# Patient Record
Sex: Female | Born: 1960 | Race: White | Hispanic: No | Marital: Married | Smoking: Former smoker
Health system: Southern US, Community
[De-identification: ages and names within clinical notes are randomized; demographics above are authoritative.]

## PROBLEM LIST (undated history)

## (undated) DIAGNOSIS — I2510 Atherosclerotic heart disease of native coronary artery without angina pectoris: Secondary | ICD-10-CM

## (undated) DIAGNOSIS — I313 Pericardial effusion (noninflammatory): Secondary | ICD-10-CM

## (undated) DIAGNOSIS — L732 Hidradenitis suppurativa: Secondary | ICD-10-CM

## (undated) DIAGNOSIS — E213 Hyperparathyroidism, unspecified: Secondary | ICD-10-CM

## (undated) DIAGNOSIS — I639 Cerebral infarction, unspecified: Secondary | ICD-10-CM

## (undated) DIAGNOSIS — I219 Acute myocardial infarction, unspecified: Secondary | ICD-10-CM

## (undated) DIAGNOSIS — I5181 Takotsubo syndrome: Secondary | ICD-10-CM

## (undated) DIAGNOSIS — I10 Essential (primary) hypertension: Secondary | ICD-10-CM

## (undated) DIAGNOSIS — Z974 Presence of external hearing-aid: Secondary | ICD-10-CM

## (undated) DIAGNOSIS — J449 Chronic obstructive pulmonary disease, unspecified: Secondary | ICD-10-CM

## (undated) DIAGNOSIS — Z973 Presence of spectacles and contact lenses: Secondary | ICD-10-CM

## (undated) DIAGNOSIS — T7840XA Allergy, unspecified, initial encounter: Secondary | ICD-10-CM

## (undated) DIAGNOSIS — I3139 Other pericardial effusion (noninflammatory): Secondary | ICD-10-CM

## (undated) DIAGNOSIS — I7300 Raynaud's syndrome without gangrene: Secondary | ICD-10-CM

## (undated) DIAGNOSIS — I209 Angina pectoris, unspecified: Secondary | ICD-10-CM

## (undated) DIAGNOSIS — H269 Unspecified cataract: Secondary | ICD-10-CM

## (undated) DIAGNOSIS — Z972 Presence of dental prosthetic device (complete) (partial): Secondary | ICD-10-CM

## (undated) DIAGNOSIS — E063 Autoimmune thyroiditis: Secondary | ICD-10-CM

## (undated) HISTORY — PX: BREAST EXCISIONAL BIOPSY: SUR124

## (undated) HISTORY — DX: Hidradenitis suppurativa: L73.2

## (undated) HISTORY — PX: CYST EXCISION: SHX5701

## (undated) HISTORY — DX: Allergy, unspecified, initial encounter: T78.40XA

## (undated) HISTORY — DX: Unspecified cataract: H26.9

## (undated) HISTORY — DX: Other pericardial effusion (noninflammatory): I31.39

## (undated) HISTORY — DX: Takotsubo syndrome: I51.81

## (undated) HISTORY — DX: Pericardial effusion (noninflammatory): I31.3

## (undated) HISTORY — DX: Essential (primary) hypertension: I10

## (undated) HISTORY — PX: EYE SURGERY: SHX253

## (undated) HISTORY — PX: LIPOMA EXCISION: SHX5283

## (undated) HISTORY — DX: Atherosclerotic heart disease of native coronary artery without angina pectoris: I25.10

## (undated) HISTORY — PX: PARATHYROIDECTOMY: SHX19

## (undated) HISTORY — DX: Hyperparathyroidism, unspecified: E21.3

## (undated) HISTORY — PX: TONSILLECTOMY: SUR1361

## (undated) HISTORY — DX: Autoimmune thyroiditis: E06.3

## (undated) HISTORY — PX: CARDIAC CATHETERIZATION: SHX172

---
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
## 2015-09-17 DIAGNOSIS — I219 Acute myocardial infarction, unspecified: Secondary | ICD-10-CM

## 2015-09-17 HISTORY — DX: Acute myocardial infarction, unspecified: I21.9

## 2019-04-13 LAB — HM MAMMOGRAPHY

## 2019-09-30 ENCOUNTER — Ambulatory Visit (INDEPENDENT_AMBULATORY_CARE_PROVIDER_SITE_OTHER): Payer: No Typology Code available for payment source | Admitting: Nurse Practitioner

## 2019-09-30 ENCOUNTER — Encounter: Payer: Self-pay | Admitting: Nurse Practitioner

## 2019-09-30 ENCOUNTER — Other Ambulatory Visit: Payer: Self-pay

## 2019-09-30 VITALS — BP 125/71 | HR 65

## 2019-09-30 DIAGNOSIS — Z9889 Other specified postprocedural states: Secondary | ICD-10-CM | POA: Insufficient documentation

## 2019-09-30 DIAGNOSIS — N952 Postmenopausal atrophic vaginitis: Secondary | ICD-10-CM

## 2019-09-30 DIAGNOSIS — I2510 Atherosclerotic heart disease of native coronary artery without angina pectoris: Secondary | ICD-10-CM | POA: Diagnosis not present

## 2019-09-30 DIAGNOSIS — E782 Mixed hyperlipidemia: Secondary | ICD-10-CM | POA: Diagnosis not present

## 2019-09-30 DIAGNOSIS — E892 Postprocedural hypoparathyroidism: Secondary | ICD-10-CM

## 2019-09-30 DIAGNOSIS — E063 Autoimmune thyroiditis: Secondary | ICD-10-CM | POA: Diagnosis not present

## 2019-09-30 DIAGNOSIS — Z9009 Acquired absence of other part of head and neck: Secondary | ICD-10-CM

## 2019-09-30 DIAGNOSIS — I10 Essential (primary) hypertension: Secondary | ICD-10-CM

## 2019-09-30 DIAGNOSIS — I25118 Atherosclerotic heart disease of native coronary artery with other forms of angina pectoris: Secondary | ICD-10-CM | POA: Insufficient documentation

## 2019-09-30 DIAGNOSIS — L732 Hidradenitis suppurativa: Secondary | ICD-10-CM

## 2019-09-30 DIAGNOSIS — I252 Old myocardial infarction: Secondary | ICD-10-CM

## 2019-09-30 DIAGNOSIS — Z87891 Personal history of nicotine dependence: Secondary | ICD-10-CM | POA: Insufficient documentation

## 2019-09-30 DIAGNOSIS — E785 Hyperlipidemia, unspecified: Secondary | ICD-10-CM | POA: Insufficient documentation

## 2019-09-30 NOTE — Assessment & Plan Note (Signed)
Chronic, ongoing.  Continue current medication regimen and adjust as needed.  Plan on thyroid labs in 4 weeks at physical.

## 2019-09-30 NOTE — Assessment & Plan Note (Signed)
Recommend continued cessation for overall health.

## 2019-09-30 NOTE — Assessment & Plan Note (Signed)
Continue Ticagrelor and Atorvastatin for prevention.  Will place referral to cardiology for patient to establish care here.

## 2019-09-30 NOTE — Assessment & Plan Note (Signed)
Chronic, stable at this time.  Treat flares as needed and consider referral to dermatology if needed.

## 2019-09-30 NOTE — Assessment & Plan Note (Signed)
At length discussion with patient on use of hormones and cardiac health. Currently she is using intravaginal and not oral.  She is okay with reduction in future of medication and use of OTC lubrications.

## 2019-09-30 NOTE — Assessment & Plan Note (Signed)
Chronic, ongoing with history of MI.  Continue statin and adjust dose as needed.  Plan on labs in 4 weeks at physical visit.

## 2019-09-30 NOTE — Patient Instructions (Signed)
DASH Eating Plan DASH stands for "Dietary Approaches to Stop Hypertension." The DASH eating plan is a healthy eating plan that has been shown to reduce high blood pressure (hypertension). It may also reduce your risk for type 2 diabetes, heart disease, and stroke. The DASH eating plan may also help with weight loss. What are tips for following this plan?  General guidelines  Avoid eating more than 2,300 mg (milligrams) of salt (sodium) a day. If you have hypertension, you may need to reduce your sodium intake to 1,500 mg a day.  Limit alcohol intake to no more than 1 drink a day for nonpregnant women and 2 drinks a day for men. One drink equals 12 oz of beer, 5 oz of wine, or 1 oz of hard liquor.  Work with your health care provider to maintain a healthy body weight or to lose weight. Ask what an ideal weight is for you.  Get at least 30 minutes of exercise that causes your heart to beat faster (aerobic exercise) most days of the week. Activities may include walking, swimming, or biking.  Work with your health care provider or diet and nutrition specialist (dietitian) to adjust your eating plan to your individual calorie needs. Reading food labels   Check food labels for the amount of sodium per serving. Choose foods with less than 5 percent of the Daily Value of sodium. Generally, foods with less than 300 mg of sodium per serving fit into this eating plan.  To find whole grains, look for the word "whole" as the first word in the ingredient list. Shopping  Buy products labeled as "low-sodium" or "no salt added."  Buy fresh foods. Avoid canned foods and premade or frozen meals. Cooking  Avoid adding salt when cooking. Use salt-free seasonings or herbs instead of table salt or sea salt. Check with your health care provider or pharmacist before using salt substitutes.  Do not fry foods. Cook foods using healthy methods such as baking, boiling, grilling, and broiling instead.  Cook with  heart-healthy oils, such as olive, canola, soybean, or sunflower oil. Meal planning  Eat a balanced diet that includes: ? 5 or more servings of fruits and vegetables each day. At each meal, try to fill half of your plate with fruits and vegetables. ? Up to 6-8 servings of whole grains each day. ? Less than 6 oz of lean meat, poultry, or fish each day. A 3-oz serving of meat is about the same size as a deck of cards. One egg equals 1 oz. ? 2 servings of low-fat dairy each day. ? A serving of nuts, seeds, or beans 5 times each week. ? Heart-healthy fats. Healthy fats called Omega-3 fatty acids are found in foods such as flaxseeds and coldwater fish, like sardines, salmon, and mackerel.  Limit how much you eat of the following: ? Canned or prepackaged foods. ? Food that is high in trans fat, such as fried foods. ? Food that is high in saturated fat, such as fatty meat. ? Sweets, desserts, sugary drinks, and other foods with added sugar. ? Full-fat dairy products.  Do not salt foods before eating.  Try to eat at least 2 vegetarian meals each week.  Eat more home-cooked food and less restaurant, buffet, and fast food.  When eating at a restaurant, ask that your food be prepared with less salt or no salt, if possible. What foods are recommended? The items listed may not be a complete list. Talk with your dietitian about   what dietary choices are best for you. Grains Whole-grain or whole-wheat bread. Whole-grain or whole-wheat pasta. Brown rice. Oatmeal. Quinoa. Bulgur. Whole-grain and low-sodium cereals. Pita bread. Low-fat, low-sodium crackers. Whole-wheat flour tortillas. Vegetables Fresh or frozen vegetables (raw, steamed, roasted, or grilled). Low-sodium or reduced-sodium tomato and vegetable juice. Low-sodium or reduced-sodium tomato sauce and tomato paste. Low-sodium or reduced-sodium canned vegetables. Fruits All fresh, dried, or frozen fruit. Canned fruit in natural juice (without  added sugar). Meat and other protein foods Skinless chicken or turkey. Ground chicken or turkey. Pork with fat trimmed off. Fish and seafood. Egg whites. Dried beans, peas, or lentils. Unsalted nuts, nut butters, and seeds. Unsalted canned beans. Lean cuts of beef with fat trimmed off. Low-sodium, lean deli meat. Dairy Low-fat (1%) or fat-free (skim) milk. Fat-free, low-fat, or reduced-fat cheeses. Nonfat, low-sodium ricotta or cottage cheese. Low-fat or nonfat yogurt. Low-fat, low-sodium cheese. Fats and oils Soft margarine without trans fats. Vegetable oil. Low-fat, reduced-fat, or light mayonnaise and salad dressings (reduced-sodium). Canola, safflower, olive, soybean, and sunflower oils. Avocado. Seasoning and other foods Herbs. Spices. Seasoning mixes without salt. Unsalted popcorn and pretzels. Fat-free sweets. What foods are not recommended? The items listed may not be a complete list. Talk with your dietitian about what dietary choices are best for you. Grains Baked goods made with fat, such as croissants, muffins, or some breads. Dry pasta or rice meal packs. Vegetables Creamed or fried vegetables. Vegetables in a cheese sauce. Regular canned vegetables (not low-sodium or reduced-sodium). Regular canned tomato sauce and paste (not low-sodium or reduced-sodium). Regular tomato and vegetable juice (not low-sodium or reduced-sodium). Pickles. Olives. Fruits Canned fruit in a light or heavy syrup. Fried fruit. Fruit in cream or butter sauce. Meat and other protein foods Fatty cuts of meat. Ribs. Fried meat. Bacon. Sausage. Bologna and other processed lunch meats. Salami. Fatback. Hotdogs. Bratwurst. Salted nuts and seeds. Canned beans with added salt. Canned or smoked fish. Whole eggs or egg yolks. Chicken or turkey with skin. Dairy Whole or 2% milk, cream, and half-and-half. Whole or full-fat cream cheese. Whole-fat or sweetened yogurt. Full-fat cheese. Nondairy creamers. Whipped toppings.  Processed cheese and cheese spreads. Fats and oils Butter. Stick margarine. Lard. Shortening. Ghee. Bacon fat. Tropical oils, such as coconut, palm kernel, or palm oil. Seasoning and other foods Salted popcorn and pretzels. Onion salt, garlic salt, seasoned salt, table salt, and sea salt. Worcestershire sauce. Tartar sauce. Barbecue sauce. Teriyaki sauce. Soy sauce, including reduced-sodium. Steak sauce. Canned and packaged gravies. Fish sauce. Oyster sauce. Cocktail sauce. Horseradish that you find on the shelf. Ketchup. Mustard. Meat flavorings and tenderizers. Bouillon cubes. Hot sauce and Tabasco sauce. Premade or packaged marinades. Premade or packaged taco seasonings. Relishes. Regular salad dressings. Where to find more information:  National Heart, Lung, and Blood Institute: www.nhlbi.nih.gov  American Heart Association: www.heart.org Summary  The DASH eating plan is a healthy eating plan that has been shown to reduce high blood pressure (hypertension). It may also reduce your risk for type 2 diabetes, heart disease, and stroke.  With the DASH eating plan, you should limit salt (sodium) intake to 2,300 mg a day. If you have hypertension, you may need to reduce your sodium intake to 1,500 mg a day.  When on the DASH eating plan, aim to eat more fresh fruits and vegetables, whole grains, lean proteins, low-fat dairy, and heart-healthy fats.  Work with your health care provider or diet and nutrition specialist (dietitian) to adjust your eating plan to your   individual calorie needs. This information is not intended to replace advice given to you by your health care provider. Make sure you discuss any questions you have with your health care provider. Document Revised: 08/15/2017 Document Reviewed: 08/26/2016 Elsevier Patient Education  2020 Elsevier Inc.  

## 2019-09-30 NOTE — Progress Notes (Addendum)
New Patient Office Visit  Subjective:  Patient ID: April Raymond, female    DOB: [birth year]  Age: 59 y.o. MRN: [id number]  CC:  Chief Complaint  Patient presents with  . Establish Care    . This visit was completed via Doximity due to the restrictions of the COVID-19 pandemic. All issues as above were discussed and addressed. Physical exam was done as above through visual confirmation on Doximity. If it was felt that the patient should be evaluated in the office, they were directed there. The patient verbally consented to this visit. . Location of the patient: home . Location of the provider: home . Those involved with this call:  . Provider: Marnee Guarneri, DNP . CMA: Yvonna Alanis, CMA . Front Desk/Registration: Don Perking  . Time spent on call: 30 minutes with patient face to face via video conference. More than 50% of this time was spent in counseling and coordination of care. 15 minutes total spent in review of patient's record and preparation of their chart.  . I verified patient identity using two factors (patient name and date of birth). Patient consents verbally to being seen via telemedicine visit today.    HPI April Raymond presents for new patient visit to establish care.  Introduced to Designer, jewellery role and practice setting.  All questions answered.  No current medical records available.  Her husband and her recently moved to [place] from [place], currently they are living in their mobile home due to the home they purchased having recent Covid + sellers who are still in house.  HYPERTENSION WITH CAD Had MI 4 years ago, age 35.  Has a family history of MI, mother in her 73's and her father's dad passed in his 12's.  Had parathyroid removed in 2018, history of hyperparathyroid. Was followed by cardiology in [place] twice a year due to her significant family history.  She continues on Ticagrelor, Amlodipine, and Metoprolol. Hypertension status: stable    Satisfied with current treatment? yes Duration of hypertension: chronic BP monitoring frequency:  daily BP range: 120/70's range at home BP medication side effects:  no Medication compliance: good compliance Aspirin: ticagrelor, no ASA Recurrent headaches: no Visual changes: no Palpitations: no Dyspnea: no Chest pain: no Lower extremity edema: no Dizzy/lightheaded: no   HYPERLIPIDEMIA Continues on Atorvastatin 20 MG daily.  Significant cardiac history in family. Hyperlipidemia status: good compliance Satisfied with current treatment?  yes Side effects:  no Medication compliance: good compliance Past cholesterol meds: Atorvastatin Supplements: none Aspirin:  no The ASCVD Risk score Mikey Bussing DC Jr., et al., 2013) failed to calculate for the following reasons:   Cannot find a previous HDL lab   Cannot find a previous total cholesterol lab   Unable to determine if patient is Non-Hispanic African American Chest pain:  no Coronary artery disease:  yes Family history CAD:  yes Family history early CAD:  yes  HIDRADENITIS SUPPURATIVA Diagnosed 4 years, cysts to groin area.  Her flares present to groin area majority of the time.  Has seen dermatology in past, but they have told her there is not much they can do.  Has been treated with abx for flares or s/s infection in past.  No current flares.  HASHIMOTO'S DISEASE Diagnosed 4 years ago.  Continues on Levothyroxine 75 MCG daily.   Thyroid control status:stable Satisfied with current treatment? yes Medication side effects: no Medication compliance: good compliance Etiology of hypothyroidism:  Recent dose adjustment:no Fatigue: no Cold intolerance: yes  Heat intolerance: no Weight gain: no Weight loss: no Constipation: no Diarrhea/loose stools: no Palpitations: no Lower extremity edema: no Anxiety/depressed mood: no   MENOPAUSAL SYMPTOMS Currently uses Estradiol 10 MCG which she inserts in vagina twice a day.  Discussed at  length with her that this is meant for short term use and risk factors with her cardiac history. Gravida/Para: 2/2 Duration: stable Symptom severity: mild Hot flashes: no Night sweats: no Sleep disturbances: no Vaginal dryness: yes Dyspareunia:no Decreased libido: no Emotional lability: no Stress incontinence: no Previous HRT/pharmacotherapy: no Hysterectomy: no GYN surgery: none Absolute Contraindications to Hormonal Therapy:     Undiagnosed vaginal bleeding: no    Breast cancer: family history    Endometrial cancer: no    Coronary disease: yes    Cerebrovascular disease: no    Venous thromboembolic disease: no  Past Medical History:  Diagnosis Date  . CAD (coronary artery disease)   . Hashimoto's disease   . Hidradenitis suppurativa   . Hydradenitis   . Hypertension   . Myocardial infarction [facility])     Past Surgical History:  Procedure Laterality Date  . CYST EXCISION  x2  . PARATHYROIDECTOMY    . TONSILLECTOMY      Family History  Problem Relation Age of Onset  . Heart attack Mother   . COPD Mother   . Stroke Mother   . Diverticulitis Mother   . Cancer Mother        breast  . Hypertension Mother   . Hypertension Father   . Hypertension Brother   . Alzheimer's disease Paternal Grandmother     Social History   Socioeconomic History  . Marital status: Married    Spouse name: Not on file  . Number of children: Not on file  . Years of education: Not on file  . Highest education level: Not on file  Occupational History  . Not on file  Tobacco Use  . Smoking status: Former Smoker    Quit date: 09/18/2015    Years since quitting: 4.0  . Smokeless tobacco: Never Used  Substance and Sexual Activity  . Alcohol use: Yes    Comment: on occasion  . Drug use: Never  . Sexual activity: Not on file  Other Topics Concern  . Not on file  Social History Narrative  . Not on file   Social Determinants of Health   Financial Resource Strain: Low Risk   .  Difficulty of Paying Living Expenses: Not hard at all  Food Insecurity: No Food Insecurity  . Worried About Charity fundraiser in the Last Year: Never true  . Ran Out of Food in the Last Year: Never true  Transportation Needs: No Transportation Needs  . Lack of Transportation (Medical): No  . Lack of Transportation (Non-Medical): No  Physical Activity: Insufficiently Active  . Days of Exercise per Week: 3 days  . Minutes of Exercise per Session: 20 min  Stress: No Stress Concern Present  . Feeling of Stress : Only a little  Social Connections: Slightly Isolated  . Frequency of Communication with Friends and Family: Three times a week  . Frequency of Social Gatherings with Friends and Family: Three times a week  . Attends Religious Services: 1 to 4 times per year  . Active Member of Clubs or Organizations: No  . Attends Archivist Meetings: Never  . Marital Status: Married  Human resources officer Violence:   . Fear of Current or Ex-Partner: Not on file  .  Emotionally Abused: Not on file  . Physically Abused: Not on file  . Sexually Abused: Not on file    ROS Review of Systems  Constitutional: Negative for activity change, appetite change, diaphoresis, fatigue and fever.  Respiratory: Negative for cough, chest tightness, shortness of breath and wheezing.   Cardiovascular: Negative for chest pain, palpitations and leg swelling.  Gastrointestinal: Negative.   Endocrine: Negative for cold intolerance and heat intolerance.  Neurological: Negative.   Psychiatric/Behavioral: Negative.     Objective:   Today's Vitals: BP 125/71   Pulse 65   LMP 11/16/2007 (Approximate)   Physical Exam Vitals and nursing note reviewed.  Constitutional:      General: She is awake. She is not in acute distress.    Appearance: She is well-developed. She is not ill-appearing.  HENT:     Head: Normocephalic.     Right Ear: Hearing normal.     Left Ear: Hearing normal.  Eyes:     General:  Lids are normal.        Right eye: No discharge.        Left eye: No discharge.     Conjunctiva/sclera: Conjunctivae normal.  Pulmonary:     Effort: Pulmonary effort is normal. No accessory muscle usage or respiratory distress.  Musculoskeletal:     Cervical back: Normal range of motion.  Neurological:     Mental Status: She is alert and oriented to person, place, and time.  Psychiatric:        Attention and Perception: Attention normal.        Mood and Affect: Mood normal.        Behavior: Behavior normal. Behavior is cooperative.        Thought Content: Thought content normal.        Judgment: Judgment normal.    Assessment & Plan:   Problem List Items Addressed This Visit      Cardiovascular and Mediastinum   CAD (coronary artery disease)    Continue Ticagrelor and Atorvastatin for prevention.  Will place referral to cardiology for patient to establish care here.      Relevant Medications   metoprolol tartrate (LOPRESSOR) 25 MG tablet   amLODipine (NORVASC) 2.5 MG tablet   atorvastatin (LIPITOR) 20 MG tablet   Other Relevant Orders   Ambulatory referral to Cardiology   Essential hypertension - Primary    Chronic, stable with BP at goal on home readings. Will continue current medication regimen and adjust as needed.  Plan to place referral to cardiology so she may establish care here with them due to her significant family and personal cardiac history.  Return in 4 weeks for physical exam.      Relevant Medications   metoprolol tartrate (LOPRESSOR) 25 MG tablet   amLODipine (NORVASC) 2.5 MG tablet   atorvastatin (LIPITOR) 20 MG tablet   Other Relevant Orders   Ambulatory referral to Cardiology     Endocrine   Hashimoto's disease    Chronic, ongoing.  Continue current medication regimen and adjust as needed.  Plan on thyroid labs in 4 weeks at physical.      Relevant Medications   levothyroxine (SYNTHROID) 75 MCG tablet   metoprolol tartrate (LOPRESSOR) 25 MG  tablet     Musculoskeletal and Integument   Hidradenitis suppurativa    Chronic, stable at this time.  Treat flares as needed and consider referral to dermatology if needed.        Genitourinary   Vaginal atrophy  At length discussion with patient on use of hormones and cardiac health. Currently she is using intravaginal and not oral.  She is okay with reduction in future of medication and use of OTC lubrications.          Other   Former smoker    Recommend continued cessation for overall health.      History of MI (myocardial infarction)    Continue current medication regimen and will place referral to cardiology here to establish care.      Relevant Orders   Ambulatory referral to Cardiology   History of parathyroidectomy    History of removal in 2018 and has been stable since, continue to monitor.      Relevant Orders   Ambulatory referral to Cardiology   Hyperlipidemia    Chronic, ongoing with history of MI.  Continue statin and adjust dose as needed.  Plan on labs in 4 weeks at physical visit.      Relevant Medications   metoprolol tartrate (LOPRESSOR) 25 MG tablet   amLODipine (NORVASC) 2.5 MG tablet   atorvastatin (LIPITOR) 20 MG tablet   Other Relevant Orders   Ambulatory referral to Cardiology      Outpatient Encounter Medications as of 09/30/2019  Medication Sig  . amLODipine (NORVASC) 2.5 MG tablet Take 2.5 mg by mouth daily.  Marland Kitchen atorvastatin (LIPITOR) 20 MG tablet Take 20 mg by mouth daily.  . Estradiol 10 MCG INST Place 1 tablet vaginally 2 (two) times a week.  . levothyroxine (SYNTHROID) 75 MCG tablet Take 75 mcg by mouth daily before breakfast.  . metoprolol tartrate (LOPRESSOR) 25 MG tablet Take 25 mg by mouth daily.  . pantoprazole (PROTONIX) 40 MG tablet Take 40 mg by mouth daily.  . ticagrelor (BRILINTA) 60 MG TABS tablet Take 60 mg by mouth 2 (two) times daily.   No facility-administered encounter medications on file as of 09/30/2019.   I  discussed the assessment and treatment plan with the patient. The patient was provided an opportunity to ask questions and all were answered. The patient agreed with the plan and demonstrated an understanding of the instructions.   The patient was advised to call back or seek an in-person evaluation if the symptoms worsen or if the condition fails to improve as anticipated.   I provided 30 minutes of time during this encounter.  Follow-up: Return in about 4 weeks (around 10/28/2019) for Annual physical if covered by insurance --- if not then follow-up.   Venita Lick, NP

## 2019-09-30 NOTE — Assessment & Plan Note (Signed)
Continue current medication regimen and will place referral to cardiology here to establish care.

## 2019-09-30 NOTE — Assessment & Plan Note (Signed)
Chronic, stable with BP at goal on home readings. Will continue current medication regimen and adjust as needed.  Plan to place referral to cardiology so she may establish care here with them due to her significant family and personal cardiac history.  Return in 4 weeks for physical exam.

## 2019-09-30 NOTE — Assessment & Plan Note (Signed)
History of removal in 2018 and has been stable since, continue to monitor. 

## 2019-10-29 ENCOUNTER — Encounter: Payer: Self-pay | Admitting: Internal Medicine

## 2019-10-29 ENCOUNTER — Other Ambulatory Visit: Payer: Self-pay

## 2019-10-29 ENCOUNTER — Ambulatory Visit (INDEPENDENT_AMBULATORY_CARE_PROVIDER_SITE_OTHER): Payer: No Typology Code available for payment source | Admitting: Internal Medicine

## 2019-10-29 VITALS — BP 150/80 | HR 68 | Ht 67.0 in | Wt 145.0 lb

## 2019-10-29 DIAGNOSIS — I10 Essential (primary) hypertension: Secondary | ICD-10-CM

## 2019-10-29 DIAGNOSIS — E785 Hyperlipidemia, unspecified: Secondary | ICD-10-CM | POA: Diagnosis not present

## 2019-10-29 DIAGNOSIS — I2510 Atherosclerotic heart disease of native coronary artery without angina pectoris: Secondary | ICD-10-CM

## 2019-10-29 NOTE — Progress Notes (Signed)
New Outpatient Visit Date: 10/29/2019  Referring Provider: Venita Lick, NP [address] [place],   [address]  Chief Complaint: Theology care  HPI:  Ms. Denis is a 59 y.o. female who is being seen today to establish cardiology care at the request of Ms. Cannady. 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  Ms. Fray recently relocated from [place].  She reports developing acute diaphoresis and chest pressure radiating to both arms after having cleaned her house in 09/2015.  She was found to have inferior ST elevation by EKG and underwent emergent cardiac catheterization with stenting of a long segment of the proximal and mid RCA.  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  Today, Ms. Matlick is feeling well.  She denies chest pain, shortness of breath, palpitations, lightheadedness, and edema.  She exercises regularly without limitations.  She is tolerating her current medications well.  She states that her blood pressure is typically well controlled at home and reports a history of whitecoat hypertension.  --------------------------------------------------------------------------------------------------  Cardiovascular History & Procedures: Cardiovascular Problems:  Coronary artery disease status post MI  Risk Factors:  Known coronary artery disease, hypertension, and hyperlipidemia  Cath/PCI:  LHC/PCI (10/15/2015, [place]): LMCA normal.  LAD with discrete 60% mid vessel stenosis.  LCx normal.  RCA with 100% occlusion.  LVEF 55% with inferior hypokinesis.  Successful PCI to RCA with overlapping Synergy 2.5 x 12, 2.25 x 12, and 2.25 x 32 mm drug-eluting  stents.  Patient also required temporary transvenous pacer and intra-aortic balloon pump.  CV Surgery:  None  EP Procedures and Devices:  None  Non-Invasive Evaluation(s):  Exercise MPI (09/19/2017): Mildly submaximal stress test with no ischemia or scar.  LVEF 75%.  --------------------------------------------------------------------------------------------------  Past Medical History:  Diagnosis Date  . CAD (coronary artery disease)   . Hashimoto's disease   . Hidradenitis suppurativa   . Hydradenitis   . Hyperparathyroidism ([place])   . Hypertension   . Myocardial infarction [facility])     Past Surgical History:  Procedure Laterality Date  . CYST EXCISION  x2  . PARATHYROIDECTOMY    . TONSILLECTOMY      Current Meds  Medication Sig  . amLODipine (NORVASC) 2.5 MG tablet Take 2.5 mg by mouth daily.  Marland Kitchen atorvastatin (LIPITOR) 20 MG tablet Take 20 mg by mouth daily.  . Estradiol 10 MCG INST Place 1 tablet vaginally 2 (two) times a week.  . levothyroxine (SYNTHROID) 75 MCG tablet Take 75 mcg by mouth daily before breakfast.  . metoprolol tartrate (LOPRESSOR) 25 MG tablet Take 25 mg by mouth daily.  . pantoprazole (PROTONIX) 40 MG tablet Take 40 mg by mouth daily.  . ticagrelor (BRILINTA) 60 MG TABS tablet Take 60 mg by mouth 2 (two) times daily.    Allergies: Humira [adalimumab]  Social History   Tobacco Use  . Smoking status: Former Smoker    Packs/day: 1.00    Years: 40.00    Pack years: 40.00    Types: Cigarettes    Quit date: 09/18/2015    Years since quitting: 4.1  . Smokeless tobacco: Never Used  Substance Use Topics  . Alcohol use: Yes    Comment: on occasion  . Drug use: Never    Family  History  Problem Relation Age of Onset  . Heart attack Mother 37  . COPD Mother   . Stroke Mother   . Diverticulitis Mother   . Cancer Mother        breast  . Hypertension Mother   . Hypertension Father   . Hypertension Brother   . Stroke Maternal Grandfather   .  Alzheimer's disease Paternal Grandmother   . Heart attack Paternal Grandfather     Review of Systems: A 12-system review of systems was performed and was negative except as noted in the HPI.  --------------------------------------------------------------------------------------------------  Physical Exam: BP (!) 150/80 (BP Location: Right Arm, Patient Position: Sitting, Cuff Size: Normal)   Pulse 68   Ht 5\' 7"  (1.702 m)   Wt 145 lb (65.8 kg)   SpO2 98%   BMI 22.71 kg/m   General: NAD. HEENT: No conjunctival pallor or scleral icterus. Facemask in place. Neck: Supple without lymphadenopathy, thyromegaly, JVD, or HJR. No carotid bruit. Lungs: Normal work of breathing. Clear to auscultation bilaterally without wheezes or crackles. Heart: Regular rate and rhythm without murmurs, rubs, or gallops. Non-displaced PMI. Abd: Bowel sounds present. Soft, NT/ND without hepatosplenomegaly Ext: No lower extremity edema. Radial, PT, and DP pulses are 2+ bilaterally Skin: Warm and dry without rash. Neuro: CNIII-XII intact. Strength and fine-touch sensation intact in upper and lower extremities bilaterally. Psych: Normal mood and affect.  EKG: Normal sinus rhythm without significant abnormality.  Outside labs (07/06/2019): Lipid panel: Total cholesterol 134, HDL 54, LDL 61, triglycerides 93  CBC: White blood cell 6.6, hemoglobin 12.5, hematocrit 39.8, platelets 337  TSH: 1.86  CMP: Sodium 141, potassium 4.6, chloride 104, CO2 28, BUN 25, creatinine 0.9, glucose 82, calcium 9.8, AST 18, ALT 19, alkaline phosphatase 30, total bilirubin 0.6, total protein 6.8, albumin 4.4  Hemoglobin A1c 5.9%  --------------------------------------------------------------------------------------------------  ASSESSMENT AND PLAN: Coronary artery disease: Ms. Ransier is status post inferior STEMI complicated by RV failure and 2017.  She recovered well from this and has been asymptomatic.  Most recent noninvasive  study in 2019 showed no ischemia or scar.  She is on appropriate medications for secondary prevention, including indefinite ticagrelor 60 mg twice daily.  I think is reasonable to continue with this regimen.  Hyperlipidemia: LDL at goal on most recent check in 06/2019, though escalation to high intensity statin therapy may provide some additional benefit.  We will plan to repeat a lipid panel in around 6 months and readdress escalation of atorvastatin at that time.  Hypertension: Blood pressure mildly elevated today, the readings are typically better at home.  I have advised Ms. Racanelli to continue monitoring her blood pressure and let us know if it is consistently above 140/90.  I reinforced importance of sodium restriction and continued exercise.  Follow-up: Return to clinic in 6 months.  Nelva Bush, MD 10/29/2019 10:30 PM

## 2019-10-29 NOTE — Patient Instructions (Signed)
Monitor your Blood pressure and heart rate at home. Let us know if BP is consistently greater than 140/90.  Medication Instructions:  Your physician recommends that you continue on your current medications as directed. Please refer to the Current Medication list given to you today.  *If you need a refill on your cardiac medications before your next appointment, please call your pharmacy*  Lab Work: NONE If you have labs (blood work) drawn today and your tests are completely normal, you will receive your results only by: Marland Kitchen MyChart Message (if you have MyChart) OR . A paper copy in the mail If you have any lab test that is abnormal or we need to change your treatment, we will call you to review the results.  Testing/Procedures: NONE  Follow-Up: At [facility], you and your health needs are our priority.  As part of our continuing mission to provide you with exceptional heart care, we have created designated Provider Care Teams.  These Care Teams include your primary Cardiologist (physician) and Advanced Practice Providers (APPs -  Physician Assistants and Nurse Practitioners) who all work together to provide you with the care you need, when you need it.  Your next appointment:   6 month(s)  The format for your next appointment:   In Person  Provider:    You may see DR Harrell Gave END or one of the following Advanced Practice Providers on your designated Care Team:    Murray Hodgkins, NP  Christell Faith, PA-C  Marrianne Mood, PA-C

## 2019-11-05 ENCOUNTER — Other Ambulatory Visit: Payer: Self-pay

## 2019-11-05 ENCOUNTER — Encounter: Payer: Self-pay | Admitting: Nurse Practitioner

## 2019-11-05 ENCOUNTER — Telehealth: Payer: Self-pay | Admitting: Internal Medicine

## 2019-11-05 ENCOUNTER — Ambulatory Visit (INDEPENDENT_AMBULATORY_CARE_PROVIDER_SITE_OTHER): Payer: No Typology Code available for payment source | Admitting: Nurse Practitioner

## 2019-11-05 VITALS — BP 142/80 | HR 67 | Temp 97.9°F | Ht 66.3 in | Wt 146.4 lb

## 2019-11-05 DIAGNOSIS — E782 Mixed hyperlipidemia: Secondary | ICD-10-CM | POA: Diagnosis not present

## 2019-11-05 DIAGNOSIS — Z1231 Encounter for screening mammogram for malignant neoplasm of breast: Secondary | ICD-10-CM

## 2019-11-05 DIAGNOSIS — E063 Autoimmune thyroiditis: Secondary | ICD-10-CM | POA: Diagnosis not present

## 2019-11-05 DIAGNOSIS — I2510 Atherosclerotic heart disease of native coronary artery without angina pectoris: Secondary | ICD-10-CM | POA: Diagnosis not present

## 2019-11-05 DIAGNOSIS — E559 Vitamin D deficiency, unspecified: Secondary | ICD-10-CM

## 2019-11-05 DIAGNOSIS — Z87891 Personal history of nicotine dependence: Secondary | ICD-10-CM

## 2019-11-05 DIAGNOSIS — Z0000 Encounter for general adult medical examination without abnormal findings: Secondary | ICD-10-CM

## 2019-11-05 DIAGNOSIS — I10 Essential (primary) hypertension: Secondary | ICD-10-CM | POA: Diagnosis not present

## 2019-11-05 DIAGNOSIS — R222 Localized swelling, mass and lump, trunk: Secondary | ICD-10-CM

## 2019-11-05 DIAGNOSIS — L732 Hidradenitis suppurativa: Secondary | ICD-10-CM

## 2019-11-05 DIAGNOSIS — N952 Postmenopausal atrophic vaginitis: Secondary | ICD-10-CM

## 2019-11-05 DIAGNOSIS — I252 Old myocardial infarction: Secondary | ICD-10-CM

## 2019-11-05 MED ORDER — LEVOTHYROXINE SODIUM 75 MCG PO TABS: 75.0000 ug | ORAL_TABLET | Freq: Every day | ORAL | 3 refills | Status: DC

## 2019-11-05 MED ORDER — PANTOPRAZOLE SODIUM 40 MG PO TBEC: 40.0000 mg | DELAYED_RELEASE_TABLET | Freq: Every day | ORAL | 3 refills | Status: DC

## 2019-11-05 MED ORDER — TICAGRELOR 60 MG PO TABS: 60.0000 mg | ORAL_TABLET | Freq: Two times a day (BID) | ORAL | 3 refills | Status: DC

## 2019-11-05 MED ORDER — ATORVASTATIN CALCIUM 20 MG PO TABS: 20.0000 mg | ORAL_TABLET | Freq: Every day | ORAL | 3 refills | Status: DC

## 2019-11-05 MED ORDER — METOPROLOL TARTRATE 25 MG PO TABS: 25.0000 mg | ORAL_TABLET | Freq: Every day | ORAL | 3 refills | Status: DC

## 2019-11-05 MED ORDER — ESTRADIOL 10 MCG VA INST: 1.0000 | VAGINAL_INSERT | VAGINAL | 3 refills | Status: DC

## 2019-11-05 MED ORDER — AMLODIPINE BESYLATE 2.5 MG PO TABS: 2.5000 mg | ORAL_TABLET | Freq: Every day | ORAL | 3 refills | Status: DC

## 2019-11-05 MED ORDER — NITROGLYCERIN 0.4 MG SL SUBL: 0.4000 mg | SUBLINGUAL_TABLET | SUBLINGUAL | 3 refills | Status: DC | PRN

## 2019-11-05 NOTE — Assessment & Plan Note (Signed)
Chronic, ongoing with BP slightly elevated on visit today, but at goal on home readings. Will continue current medication regimen and adjust as needed.  Continue collaboration with cardiology due to her significant family and personal cardiac history.  Labs today. Return in 6 months. 

## 2019-11-05 NOTE — Assessment & Plan Note (Signed)
At length discussion with patient on use of hormones and cardiac health. Currently she is using intravaginal and not oral.  She is okay with reduction in future of medication and use of OTC lubrications.

## 2019-11-05 NOTE — Patient Instructions (Addendum)
[facility]  Address: [address] [place], [place], [place] [address]  Phone: [phone]  Health Maintenance, Female Adopting a healthy lifestyle and getting preventive care are important in promoting health and wellness. Ask your health care provider about:  The right schedule for you to have regular tests and exams.  Things you can do on your own to prevent diseases and keep yourself healthy. What should I know about diet, weight, and exercise? Eat a healthy diet   Eat a diet that includes plenty of vegetables, fruits, low-fat dairy products, and lean protein.  Do not eat a lot of foods that are high in solid fats, added sugars, or sodium. Maintain a healthy weight Body mass index (BMI) is used to identify weight problems. It estimates body fat based on height and weight. Your health care provider can help determine your BMI and help you achieve or maintain a healthy weight. Get regular exercise Get regular exercise. This is one of the most important things you can do for your health. Most adults should:  Exercise for at least 150 minutes each week. The exercise should increase your heart rate and make you sweat (moderate-intensity exercise).  Do strengthening exercises at least twice a week. This is in addition to the moderate-intensity exercise.  Spend less time sitting. Even light physical activity can be beneficial. Watch cholesterol and blood lipids Have your blood tested for lipids and cholesterol at 60 years of age, then have this test every 5 years. Have your cholesterol levels checked more often if:  Your lipid or cholesterol levels are high.  You are older than 60 years of age.  You are at high risk for heart disease. What should I know about cancer screening? Depending on your health history and family history, you may need to have cancer screening at various ages. This may include screening for:  Breast cancer.  Cervical  cancer.  Colorectal cancer.  Skin cancer.  Lung cancer. What should I know about heart disease, diabetes, and high blood pressure? Blood pressure and heart disease  High blood pressure causes heart disease and increases the risk of stroke. This is more likely to develop in people who have high blood pressure readings, are of African descent, or are overweight.  Have your blood pressure checked: ? Every 3-5 years if you are 60-60 years of age. ? Every year if you are 60 years old or older. Diabetes Have regular diabetes screenings. This checks your fasting blood sugar level. Have the screening done:  Once every three years after age 60 if you are at a normal weight and have a low risk for diabetes.  More often and at a younger age if you are overweight or have a high risk for diabetes. What should I know about preventing infection? Hepatitis B If you have a higher risk for hepatitis B, you should be screened for this virus. Talk with your health care provider to find out if you are at risk for hepatitis B infection. Hepatitis C Testing is recommended for:  Everyone born from [birth year] through 1966.  Anyone with known risk factors for hepatitis C. Sexually transmitted infections (STIs)  Get screened for STIs, including gonorrhea and chlamydia, if: ? You are sexually active and are younger than 60 years of age. ? You are older than 60 years of age and your health care provider tells you that you are at risk for this type of infection. ? Your sexual activity has changed  since you were last screened, and you are at increased risk for chlamydia or gonorrhea. Ask your health care provider if you are at risk.  Ask your health care provider about whether you are at high risk for HIV. Your health care provider may recommend a prescription medicine to help prevent HIV infection. If you choose to take medicine to prevent HIV, you should first get tested for HIV. You should then be tested every 3  months for as long as you are taking the medicine. Pregnancy  If you are about to stop having your period (premenopausal) and you may become pregnant, seek counseling before you get pregnant.  Take 400 to 800 micrograms (mcg) of folic acid every day if you become pregnant.  Ask for birth control (contraception) if you want to prevent pregnancy. Osteoporosis and menopause Osteoporosis is a disease in which the bones lose minerals and strength with aging. This can result in bone fractures. If you are 10 years old or older, or if you are at risk for osteoporosis and fractures, ask your health care provider if you should:  Be screened for bone loss.  Take a calcium or vitamin D supplement to lower your risk of fractures.  Be given hormone replacement therapy (HRT) to treat symptoms of menopause. Follow these instructions at home: Lifestyle  Do not use any products that contain nicotine or tobacco, such as cigarettes, e-cigarettes, and chewing tobacco. If you need help quitting, ask your health care provider.  Do not use street drugs.  Do not share needles.  Ask your health care provider for help if you need support or information about quitting drugs. Alcohol use  Do not drink alcohol if: ? Your health care provider tells you not to drink. ? You are pregnant, may be pregnant, or are planning to become pregnant.  If you drink alcohol: ? Limit how much you use to 0-1 drink a day. ? Limit intake if you are breastfeeding.  Be aware of how much alcohol is in your drink. In the [place] one drink equals one 12 oz bottle of beer (355 mL), one 5 oz glass of wine (148 mL), or one 1 oz glass of hard liquor (44 mL). General instructions  Schedule regular health, dental, and eye exams.  Stay current with your vaccines.  Tell your health care provider if: ? You often feel depressed. ? You have ever been abused or do not feel safe at home. Summary  Adopting a healthy lifestyle and getting  preventive care are important in promoting health and wellness.  Follow your health care provider's instructions about healthy diet, exercising, and getting tested or screened for diseases.  Follow your health care provider's instructions on monitoring your cholesterol and blood pressure. This information is not intended to replace advice given to you by your health care provider. Make sure you discuss any questions you have with your health care provider. Document Revised: 08/26/2018 Document Reviewed: 08/26/2018 Elsevier Patient Education  2020 [facility]) Exercise Recommendation  Being physically active is important to prevent heart disease and stroke, the nation's No. 1and No. 5killers. To improve overall cardiovascular health, we suggest at least 150 minutes per week of moderate exercise or 75 minutes per week of vigorous exercise (or a combination of moderate and vigorous activity). Thirty minutes a day, five times a week is an easy goal to remember. You will also experience benefits even if you divide your time into two or three segments of  10 to 15 minutes per day.  For people who would benefit from lowering their blood pressure or cholesterol, we recommend 40 minutes of aerobic exercise of moderate to vigorous intensity three to four times a week to lower the risk for heart attack and stroke.  Physical activity is anything that makes you move your body and burn calories.  This includes things like climbing stairs or playing sports. Aerobic exercises benefit your heart, and include walking, jogging, swimming or biking. Strength and stretching exercises are best for overall stamina and flexibility.  The simplest, positive change you can make to effectively improve your heart health is to start walking. It's enjoyable, free, easy, social and great exercise. A walking program is flexible and boasts high success rates because people can stick with it. It's  easy for walking to become a regular and satisfying part of life.   For Overall Cardiovascular Health:  At least 30 minutes of moderate-intensity aerobic activity at least 5 days per week for a total of 150  OR   At least 25 minutes of vigorous aerobic activity at least 3 days per week for a total of 75 minutes; or a combination of moderate- and vigorous-intensity aerobic activity  AND   Moderate- to high-intensity muscle-strengthening activity at least 2 days per week for additional health benefits.  For Lowering Blood Pressure and Cholesterol  An average 40 minutes of moderate- to vigorous-intensity aerobic activity 3 or 4 times per week  What if I can't make it to the time goal? Something is always better than nothing! And everyone has to start somewhere. Even if you've been sedentary for years, today is the day you can begin to make healthy changes in your life. If you don't think you'll make it for 30 or 40 minutes, set a reachable goal for today. You can work up toward your overall goal by increasing your time as you get stronger. Don't let all-or-nothing thinking rob you of doing what you can every day.  Source:http://www.heart.org

## 2019-11-05 NOTE — Telephone Encounter (Signed)
  Patient saw Dr End on 10/29/19 and she states he was supposed to call her in a prescription for Nitroglycerin. She would like a script called into [facility] on [address].

## 2019-11-05 NOTE — Progress Notes (Signed)
BP (!) 142/80   Pulse 67   Temp 97.9 F (36.6 C) (Oral)   Ht 5' 6.3" (1.684 m)   Wt 146 lb 6.4 oz (66.4 kg)   SpO2 97%   BMI 23.42 kg/m    Subjective:    Patient ID: April Raymond, female    DOB: November 19, 1960, 59 y.o.   MRN: [id number]  HPI: April Raymond is a 59 y.o. female presenting on 11/05/2019 for comprehensive medical examination. Current medical complaints include:none  She currently lives with: husband Menopausal Symptoms: no   HYPERTENSION WITH CAD Had MI 4 years ago, age 30.  Has a family history of MI, mother in her 42's and her father's dad passed in his 18's.  Had parathyroid removed in 2018, history of hyperparathyroid. Was followed by cardiology in [place] twice a year due to her significant family history.  She continues on Ticagrelor, Amlodipine, and Metoprolol.  Established care with cardiology here, Dr. Saunders Revel, on 10/29/2019.  October 2020 labs showed LDL 61 and TCHOL 134, TSH 1.857.  At time her CRT 0.9, GFR 64, 4.6. Hypertension status: stable  Satisfied with current treatment? yes Duration of hypertension: chronic BP monitoring frequency:  daily BP range: 120/70's range at home -- 128/60 at home BP medication side effects:  no Medication compliance: good compliance Aspirin: ticagrelor, no ASA Recurrent headaches: no Visual changes: no Palpitations: no Dyspnea: no Chest pain: no Lower extremity edema: no Dizzy/lightheaded: no   GERD Takes Protonix 40 MG daily for GI protection. GERD control status: stable  Satisfied with current treatment? yes Heartburn frequency: none Medication side effects: no  Medication compliance: stable Previous GERD medications: Antacid use frequency:   Duration:  Nature:  Location:  Heartburn duration:  Alleviatiating factors:   Aggravating factors:  Dysphagia: no Odynophagia:  no Hematemesis: no Blood in stool: no EGD: yes   HYPERLIPIDEMIA Continues on Atorvastatin 20 MG daily.  Significant cardiac history in  family. Hyperlipidemia status: good compliance Satisfied with current treatment?  yes Side effects:  no Medication compliance: good compliance Past cholesterol meds: Atorvastatin Supplements: none Aspirin:  no The ASCVD Risk score Mikey Bussing DC Jr., et al., 2013) failed to calculate for the following reasons:   Cannot find a previous HDL lab   Cannot find a previous total cholesterol lab   Unable to determine if patient is Non-Hispanic African American Chest pain:  no Coronary artery disease:  yes Family history CAD:  yes Family history early CAD:  yes  HIDRADENITIS SUPPURATIVA Diagnosed 4 years, cysts to groin area.  Her flares present to groin area majority of the time.  Has seen dermatology in past, but they have told her there is not much they can do.  Has been treated with abx for flares or s/s infection in past.  No current flares.  HASHIMOTO'S DISEASE Diagnosed 4 years ago.  Continues on Levothyroxine 75 MCG daily.   Thyroid control status:stable Satisfied with current treatment? yes Medication side effects: no Medication compliance: good compliance Etiology of hypothyroidism:  Recent dose adjustment:no Fatigue: no Cold intolerance: yes Heat intolerance: no Weight gain: no Weight loss: no Constipation: no Diarrhea/loose stools: no Palpitations: no Lower extremity edema: no Anxiety/depressed mood: no   MENOPAUSAL SYMPTOMS Currently uses Estradiol 10 MCG which she inserts in vagina twice a day.  Discussed at length with her that this is meant for short term use and risk factors with her cardiac history. Gravida/Para: 2/2 Duration: stable Symptom severity: mild Hot flashes: no Night sweats: no  Sleep disturbances: no Vaginal dryness: yes Dyspareunia:no Decreased libido: no Emotional lability: no Stress incontinence: no Previous HRT/pharmacotherapy: no Hysterectomy: no GYN surgery: none Absolute Contraindications to Hormonal Therapy:     Undiagnosed vaginal  bleeding: no    Breast cancer: family history    Endometrial cancer: no    Coronary disease: yes    Cerebrovascular disease: no    Venous thromboembolic disease: no  Depression Screen done today and results listed below:  Depression screen [facility] 2/9 09/30/2019  Decreased Interest 0  Down, Depressed, Hopeless 0  PHQ - 2 Score 0    The patient does not have a history of falls. I did not complete a risk assessment for falls. A plan of care for falls was not documented.   Past Medical History:  Past Medical History:  Diagnosis Date  . CAD (coronary artery disease)   . Hashimoto's disease   . Hidradenitis suppurativa   . Hydradenitis   . Hyperparathyroidism ([place])   . Hypertension   . Myocardial infarction [facility])     Surgical History:  Past Surgical History:  Procedure Laterality Date  . CYST EXCISION  x2  . PARATHYROIDECTOMY    . TONSILLECTOMY      Medications:  No current outpatient medications on file prior to visit.   No current facility-administered medications on file prior to visit.    Allergies:  Allergies  Allergen Reactions  . Humira [Adalimumab] Rash    Social History:  Social History   Socioeconomic History  . Marital status: Married    Spouse name: Not on file  . Number of children: Not on file  . Years of education: Not on file  . Highest education level: Not on file  Occupational History  . Not on file  Tobacco Use  . Smoking status: Former Smoker    Packs/day: 1.00    Years: 40.00    Pack years: 40.00    Types: Cigarettes    Quit date: 09/18/2015    Years since quitting: 4.1  . Smokeless tobacco: Never Used  Substance and Sexual Activity  . Alcohol use: Yes    Comment: on occasion  . Drug use: Never  . Sexual activity: Not on file  Other Topics Concern  . Not on file  Social History Narrative  . Not on file   Social Determinants of Health   Financial Resource Strain: Low Risk   . Difficulty of Paying Living Expenses: Not hard at  all  Food Insecurity: No Food Insecurity  . Worried About Charity fundraiser in the Last Year: Never true  . Ran Out of Food in the Last Year: Never true  Transportation Needs: No Transportation Needs  . Lack of Transportation (Medical): No  . Lack of Transportation (Non-Medical): No  Physical Activity: Insufficiently Active  . Days of Exercise per Week: 3 days  . Minutes of Exercise per Session: 20 min  Stress: No Stress Concern Present  . Feeling of Stress : Only a little  Social Connections: Slightly Isolated  . Frequency of Communication with Friends and Family: Three times a week  . Frequency of Social Gatherings with Friends and Family: Three times a week  . Attends Religious Services: 1 to 4 times per year  . Active Member of Clubs or Organizations: No  . Attends Archivist Meetings: Never  . Marital Status: Married  Human resources officer Violence:   . Fear of Current or Ex-Partner: Not on file  . Emotionally Abused: Not on  file  . Physically Abused: Not on file  . Sexually Abused: Not on file   Social History   Tobacco Use  Smoking Status Former Smoker  . Packs/day: 1.00  . Years: 40.00  . Pack years: 40.00  . Types: Cigarettes  . Quit date: 09/18/2015  . Years since quitting: 4.1  Smokeless Tobacco Never Used   Social History   Substance and Sexual Activity  Alcohol Use Yes   Comment: on occasion    Family History:  Family History  Problem Relation Age of Onset  . Heart attack Mother 41  . COPD Mother   . Stroke Mother   . Diverticulitis Mother   . Cancer Mother        breast  . Hypertension Mother   . Hypertension Father   . Hypertension Brother   . Stroke Maternal Grandfather   . Alzheimer's disease Paternal Grandmother   . Heart attack Paternal Grandfather     Past medical history, surgical history, medications, allergies, family history and social history reviewed with patient today and changes made to appropriate areas of the chart.    Review of Systems - negative All other ROS negative except what is listed above and in the HPI.      Objective:    BP (!) 142/80   Pulse 67   Temp 97.9 F (36.6 C) (Oral)   Ht 5' 6.3" (1.684 m)   Wt 146 lb 6.4 oz (66.4 kg)   SpO2 97%   BMI 23.42 kg/m   Wt Readings from Last 3 Encounters:  11/05/19 146 lb 6.4 oz (66.4 kg)  10/29/19 145 lb (65.8 kg)    Physical Exam Constitutional:      General: She is awake. She is not in acute distress.    Appearance: She is well-developed. She is not ill-appearing.  HENT:     Head: Normocephalic and atraumatic.     Right Ear: Hearing, tympanic membrane, ear canal and external ear normal. No drainage.     Left Ear: Hearing, tympanic membrane, ear canal and external ear normal. No drainage.     Nose: Nose normal.     Right Sinus: No maxillary sinus tenderness or frontal sinus tenderness.     Left Sinus: No maxillary sinus tenderness or frontal sinus tenderness.     Mouth/Throat:     Mouth: Mucous membranes are moist.     Pharynx: Oropharynx is clear. Uvula midline. No pharyngeal swelling, oropharyngeal exudate or posterior oropharyngeal erythema.  Eyes:     General: Lids are normal.        Right eye: No discharge.        Left eye: No discharge.     Extraocular Movements: Extraocular movements intact.     Conjunctiva/sclera: Conjunctivae normal.     Pupils: Pupils are equal, round, and reactive to light.     Visual Fields: Right eye visual fields normal and left eye visual fields normal.  Neck:     Thyroid: No thyromegaly.     Vascular: No carotid bruit.     Trachea: Trachea normal.  Cardiovascular:     Rate and Rhythm: Normal rate and regular rhythm.     Heart sounds: Normal heart sounds. No murmur. No gallop.   Pulmonary:     Effort: Pulmonary effort is normal. No accessory muscle usage or respiratory distress.     Breath sounds: Normal breath sounds.  Chest:     Breasts:        Right: Normal.  Left: Normal.   Abdominal:     General: Bowel sounds are normal.     Palpations: Abdomen is soft. There is no hepatomegaly or splenomegaly.     Tenderness: There is no abdominal tenderness.  Musculoskeletal:        General: Normal range of motion.     Cervical back: Normal range of motion and neck supple.     Right lower leg: No edema.     Left lower leg: No edema.  Lymphadenopathy:     Head:     Right side of head: No submental, submandibular, tonsillar, preauricular or posterior auricular adenopathy.     Left side of head: No submental, submandibular, tonsillar, preauricular or posterior auricular adenopathy.     Cervical: No cervical adenopathy.     Upper Body:     Right upper body: No supraclavicular, axillary or pectoral adenopathy.     Left upper body: No supraclavicular, axillary or pectoral adenopathy.  Skin:    General: Skin is warm and dry.     Capillary Refill: Capillary refill takes less than 2 seconds.     Findings: No rash.       Neurological:     Mental Status: She is alert and oriented to person, place, and time.     Cranial Nerves: Cranial nerves are intact.     Gait: Gait is intact.     Deep Tendon Reflexes: Reflexes are normal and symmetric.     Reflex Scores:      Brachioradialis reflexes are 2+ on the right side and 2+ on the left side.      Patellar reflexes are 2+ on the right side and 2+ on the left side. Psychiatric:        Attention and Perception: Attention normal.        Mood and Affect: Mood normal.        Speech: Speech normal.        Behavior: Behavior normal. Behavior is cooperative.        Thought Content: Thought content normal.        Judgment: Judgment normal.     Results for orders placed or performed in visit on 11/05/19  HM MAMMOGRAPHY  Result Value Ref Range   HM Mammogram 0-4 Bi-Rad 0-4 Bi-Rad, Self Reported Normal      Assessment & Plan:   Problem List Items Addressed This Visit      Cardiovascular and Mediastinum   CAD (coronary artery  disease)    Continue Ticagrelor and Atorvastatin for prevention + collaboration with cardiology.  High risk family and patient history.      Relevant Medications   metoprolol tartrate (LOPRESSOR) 25 MG tablet   atorvastatin (LIPITOR) 20 MG tablet   amLODipine (NORVASC) 2.5 MG tablet   Essential hypertension    Chronic, ongoing with BP slightly elevated on visit today, but at goal on home readings. Will continue current medication regimen and adjust as needed.  Continue collaboration with cardiology due to her significant family and personal cardiac history.  Labs today. Return in 6 months.      Relevant Medications   metoprolol tartrate (LOPRESSOR) 25 MG tablet   atorvastatin (LIPITOR) 20 MG tablet   amLODipine (NORVASC) 2.5 MG tablet   Other Relevant Orders   Comprehensive metabolic panel   Magnesium     Endocrine   Hashimoto's disease    Chronic, ongoing.  Continue current medication regimen and adjust as needed.  Thyroid labs today.  Relevant Medications   metoprolol tartrate (LOPRESSOR) 25 MG tablet   levothyroxine (SYNTHROID) 75 MCG tablet   Other Relevant Orders   Thyroid Panel With TSH     Musculoskeletal and Integument   Hidradenitis suppurativa    Chronic, stable at this time.  Treat flares as needed and consider referral to dermatology if needed.        Genitourinary   Vaginal atrophy    At length discussion with patient on use of hormones and cardiac health. Currently she is using intravaginal and not oral.  She is okay with reduction in future of medication and use of OTC lubrications.          Other   Former smoker    Recommend continued cessation for overall health.      History of MI (myocardial infarction)    Continue statin and Ticagrelor + collaboration with cardiology.      Hyperlipidemia    Chronic, ongoing with history of MI.  Continue statin and adjust dose as needed.  If elevation will increase Atorvastatin, reviewed recent cardiology  note which mentioned elevation to high intensity statin may offer additional benefit to patient.  Discussed with patient.      Relevant Medications   metoprolol tartrate (LOPRESSOR) 25 MG tablet   atorvastatin (LIPITOR) 20 MG tablet   amLODipine (NORVASC) 2.5 MG tablet   Other Relevant Orders   Comprehensive metabolic panel   Lipid Panel w/o Chol/HDL Ratio   Mass of skin of back    Obtain u/s of area, suspect lipoma or cyst.  Will assess and send to general surgery as needed dependent on findings and stability of area.      Relevant Orders   [place] CHEST SOFT TISSUE    Other Visit Diagnoses    Annual physical exam    -  Primary   Annual labs to include CBC, CMP, TSH, lipid   Relevant Orders   CBC with Differential/Platelet   MM DIGITAL SCREENING BILATERAL   Vitamin D deficiency       Takes daily supplement, history of low levels reported.  Check level today and adjust supplement dose as needed.   Relevant Orders   VITAMIN D 25 Hydroxy (Vit-D Deficiency, Fractures)   Breast cancer screening by mammogram       Mammogram ordered, patient to schedule for July   Relevant Orders   MM DIGITAL SCREENING BILATERAL       Follow up plan: Return in about 6 months (around 05/04/2020) for HTN/HLD.   LABORATORY TESTING:  - Pap smear: up to date  IMMUNIZATIONS:   - Tdap: Tetanus vaccination status reviewed: unknown, will try to get records from [place]. - Influenza: Up to date - Pneumovax: Not applicable - Prevnar: Not applicable - HPV: Not applicable - Zostavax vaccine: will check with drug company  SCREENING: -Mammogram: Up to date  - Colonoscopy: Up to date --- 2 years ago, will attempt to get records - Bone Density: Not applicable  -Hearing Test: Not applicable  -Spirometry: Not applicable   PATIENT COUNSELING:   Advised to take 1 mg of folate supplement per day if capable of pregnancy.   Sexuality: Discussed sexually transmitted diseases, partner selection, use of condoms,  avoidance of unintended pregnancy  and contraceptive alternatives.   Advised to avoid cigarette smoking.  I discussed with the patient that most people either abstain from alcohol or drink within safe limits (<=14/week and <=4 drinks/occasion for males, <=7/weeks and <= 3 drinks/occasion for females) and  that the risk for alcohol disorders and other health effects rises proportionally with the number of drinks per week and how often a drinker exceeds daily limits.  Discussed cessation/primary prevention of drug use and availability of treatment for abuse.   Diet: Encouraged to adjust caloric intake to maintain  or achieve ideal body weight, to reduce intake of dietary saturated fat and total fat, to limit sodium intake by avoiding high sodium foods and not adding table salt, and to maintain adequate dietary potassium and calcium preferably from fresh fruits, vegetables, and low-fat dairy products.    stressed the importance of regular exercise  Injury prevention: Discussed safety belts, safety helmets, smoke detector, smoking near bedding or upholstery.   Dental health: Discussed importance of regular tooth brushing, flossing, and dental visits.    NEXT PREVENTATIVE PHYSICAL DUE IN 1 YEAR. Return in about 6 months (around 05/04/2020) for HTN/HLD.

## 2019-11-05 NOTE — Telephone Encounter (Signed)
Yes, please send in Rx for nitroglycerin 0.4 mg SL Q5 min prn chest pain.  Thanks.  Nelva Bush, MD [facility]

## 2019-11-05 NOTE — Assessment & Plan Note (Signed)
Chronic, ongoing with history of MI.  Continue statin and adjust dose as needed.  If elevation will increase Atorvastatin, reviewed recent cardiology note which mentioned elevation to high intensity statin may offer additional benefit to patient.  Discussed with patient.

## 2019-11-05 NOTE — Assessment & Plan Note (Signed)
Chronic, ongoing.  Continue current medication regimen and adjust as needed.  Thyroid labs today. 

## 2019-11-05 NOTE — Assessment & Plan Note (Signed)
Obtain u/s of area, suspect lipoma or cyst.  Will assess and send to general surgery as needed dependent on findings and stability of area.

## 2019-11-05 NOTE — Assessment & Plan Note (Signed)
Chronic, stable at this time.  Treat flares as needed and consider referral to dermatology if needed.

## 2019-11-05 NOTE — Assessment & Plan Note (Signed)
Recommend continued cessation for overall health.

## 2019-11-05 NOTE — Telephone Encounter (Signed)
Nitroglycerin is not listed in the patients current medication list. Reviewed the patients 10/29/19 o/v with Dr. Saunders Revel, no documentation regarding Nitro.  Msg routed to Dr. Saunders Revel for [facility] Rx approval.

## 2019-11-05 NOTE — Telephone Encounter (Signed)
Spoke with the patient. Advised her that the Rx for Nitro has been sent to her pharmacy as requested. Patient has no questions or concerns at this time.

## 2019-11-05 NOTE — Assessment & Plan Note (Signed)
Continue Ticagrelor and Atorvastatin for prevention + collaboration with cardiology.  High risk family and patient history. 

## 2019-11-05 NOTE — Assessment & Plan Note (Signed)
Continue statin and Ticagrelor + collaboration with cardiology.

## 2019-11-06 LAB — COMPREHENSIVE METABOLIC PANEL
ALT: 24 IU/L (ref 0–32)
AST: 26 IU/L (ref 0–40)
Albumin/Globulin Ratio: 2.5 — ABNORMAL HIGH (ref 1.2–2.2)
Albumin: 4.8 g/dL (ref 3.8–4.9)
Alkaline Phosphatase: 39 IU/L (ref 39–117)
BUN/Creatinine Ratio: 18 (ref 9–23)
BUN: 18 mg/dL (ref 6–24)
Bilirubin Total: 0.4 mg/dL (ref 0.0–1.2)
CO2: 24 mmol/L (ref 20–29)
Calcium: 9.8 mg/dL (ref 8.7–10.2)
Chloride: 99 mmol/L (ref 96–106)
Creatinine, Ser: 1 mg/dL (ref 0.57–1.00)
GFR calc Af Amer: 71 mL/min/{1.73_m2} (ref >59)
GFR calc non Af Amer: 62 mL/min/{1.73_m2} (ref >59)
Globulin, Total: 1.9 g/dL (ref 1.5–4.5)
Glucose: 86 mg/dL (ref 65–99)
Potassium: 4.4 mmol/L (ref 3.5–5.2)
Sodium: 135 mmol/L (ref 134–144)
Total Protein: 6.7 g/dL (ref 6.0–8.5)

## 2019-11-06 LAB — LIPID PANEL W/O CHOL/HDL RATIO
Cholesterol, Total: 144 mg/dL (ref 100–199)
HDL: 64 mg/dL (ref >39)
LDL Chol Calc (NIH): 56 mg/dL (ref 0–99)
Triglycerides: 138 mg/dL (ref 0–149)
VLDL Cholesterol Cal: 24 mg/dL (ref 5–40)

## 2019-11-06 LAB — CBC WITH DIFFERENTIAL/PLATELET
Basophils Absolute: 0.1 10*3/uL (ref 0.0–0.2)
Basos: 2 %
EOS (ABSOLUTE): 0.2 10*3/uL (ref 0.0–0.4)
Eos: 3 %
Hematocrit: 36.2 % (ref 34.0–46.6)
Hemoglobin: 12 g/dL (ref 11.1–15.9)
Immature Grans (Abs): 0 10*3/uL (ref 0.0–0.1)
Immature Granulocytes: 0 %
Lymphocytes Absolute: 1.9 10*3/uL (ref 0.7–3.1)
Lymphs: 32 %
MCH: 31.9 pg (ref 26.6–33.0)
MCHC: 33.1 g/dL (ref 31.5–35.7)
MCV: 96 fL (ref 79–97)
Monocytes Absolute: 0.4 10*3/uL (ref 0.1–0.9)
Monocytes: 7 %
Neutrophils Absolute: 3.5 10*3/uL (ref 1.4–7.0)
Neutrophils: 56 %
Platelets: 359 10*3/uL (ref 150–450)
RBC: 3.76 x10E6/uL — ABNORMAL LOW (ref 3.77–5.28)
RDW: 12.7 % (ref 11.7–15.4)
WBC: 6.1 10*3/uL (ref 3.4–10.8)

## 2019-11-06 LAB — MAGNESIUM: Magnesium: 2.1 mg/dL (ref 1.6–2.3)

## 2019-11-06 LAB — THYROID PANEL WITH TSH
Free Thyroxine Index: 1.9 (ref 1.2–4.9)
T3 Uptake Ratio: 28 % (ref 24–39)
T4, Total: 6.9 ug/dL (ref 4.5–12.0)
TSH: 3.74 u[IU]/mL (ref 0.450–4.500)

## 2019-11-06 LAB — VITAMIN D 25 HYDROXY (VIT D DEFICIENCY, FRACTURES): Vit D, 25-Hydroxy: 40.4 ng/mL (ref 30.0–100.0)

## 2019-11-06 NOTE — Progress Notes (Signed)
Contacted via MyChart

## 2019-11-09 ENCOUNTER — Encounter: Payer: Self-pay | Admitting: Nurse Practitioner

## 2019-11-11 ENCOUNTER — Ambulatory Visit
Admission: RE | Admit: 2019-11-11 | Discharge: 2019-11-11 | Disposition: A | Discharge status: Home / Self Care | Payer: BLUE CROSS/BLUE SHIELD | Source: Ambulatory Visit | Attending: Nurse Practitioner | Admitting: Nurse Practitioner

## 2019-11-11 ENCOUNTER — Other Ambulatory Visit: Payer: Self-pay

## 2019-11-11 DIAGNOSIS — R222 Localized swelling, mass and lump, trunk: Secondary | ICD-10-CM | POA: Insufficient documentation

## 2019-11-11 IMAGING — US US SOFT TISSUE
1 series · 14 of 16 positions shown · non-contrast
Comparison: None

CLINICAL DATA: Firm non mobile 3 cm mass at posterior RIGHT back at
RIGHT ninth tenth rib line noted 1 week ago question lipoma or cyst

EXAM:
CHEST ULTRASOUND

[Series 1: us soft tissue · 28 acquisitions, 14 frames shown]
[im 1/28]
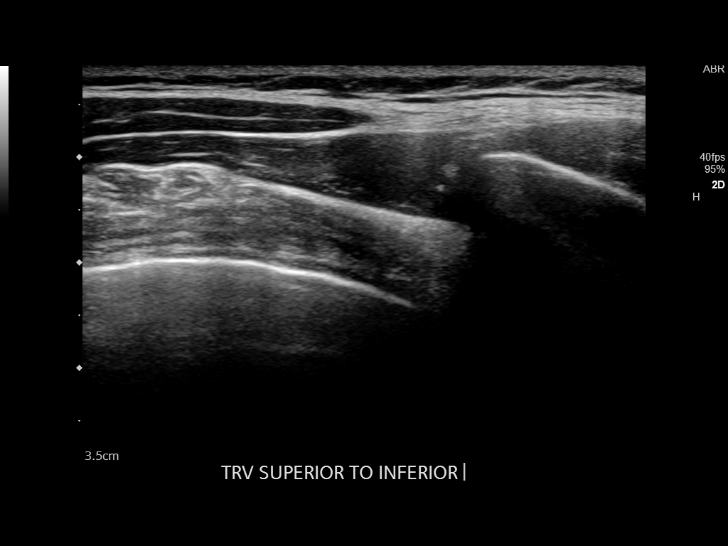
[im 2/28]
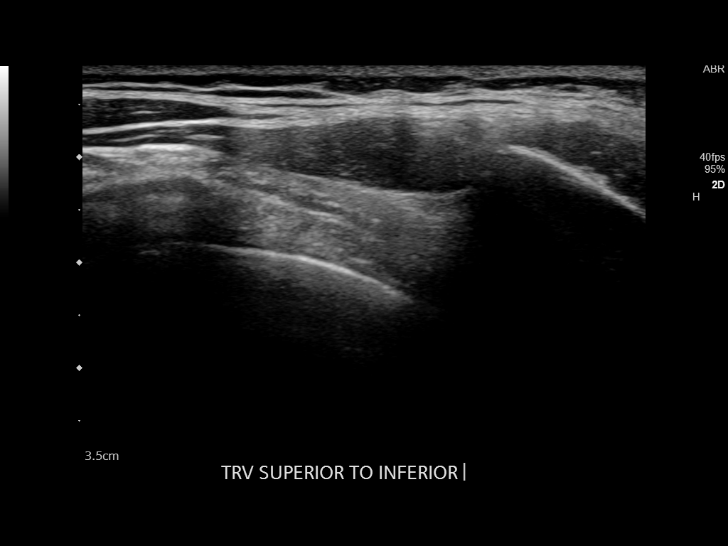
[im 4/28]
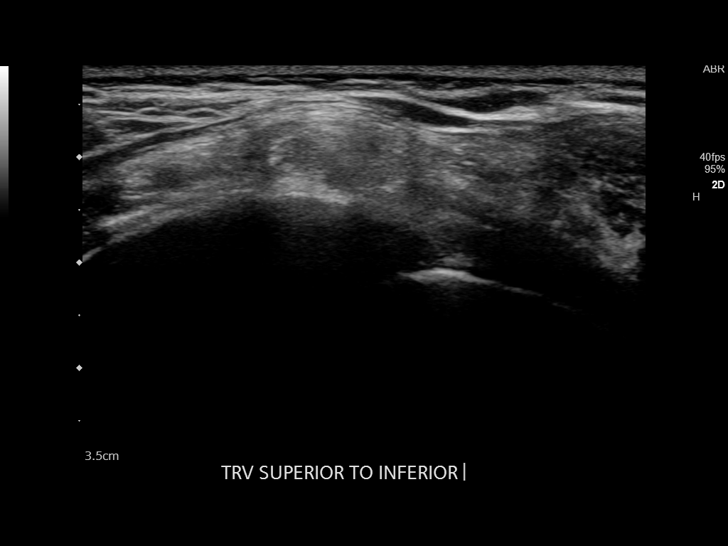
[im 8/28]
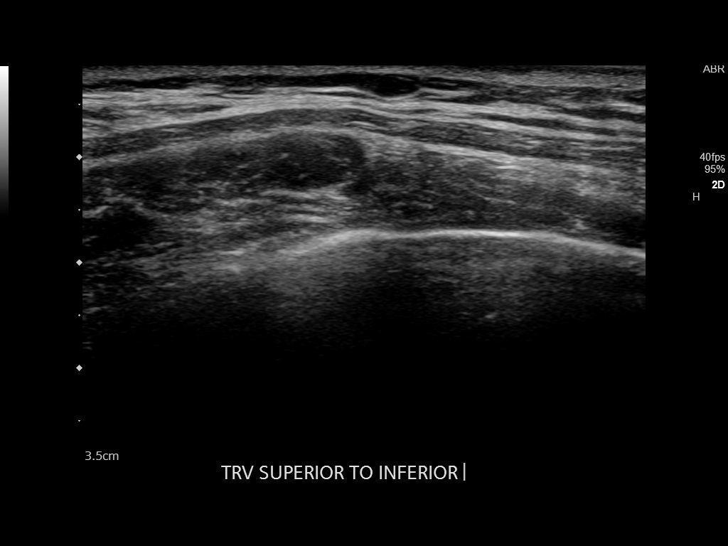
[im 10/28]
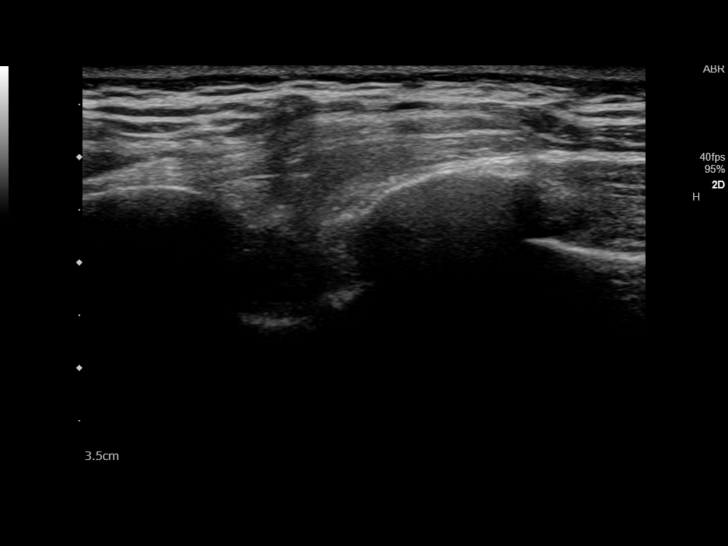
[im 11/28]
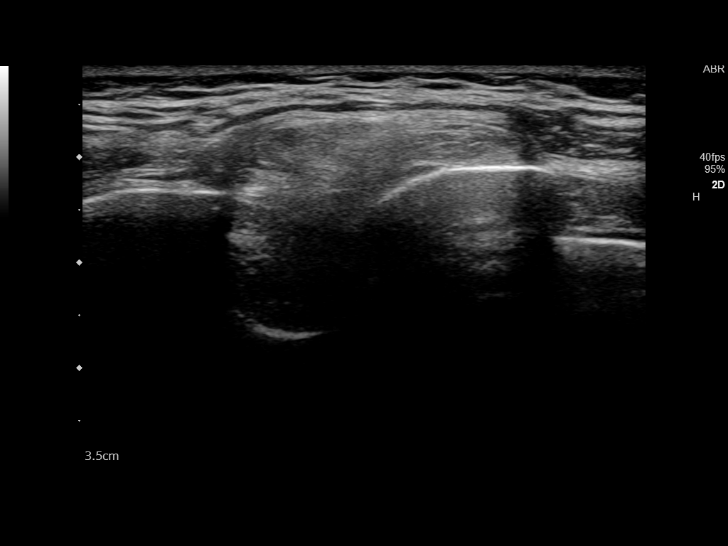
[im 13/28]
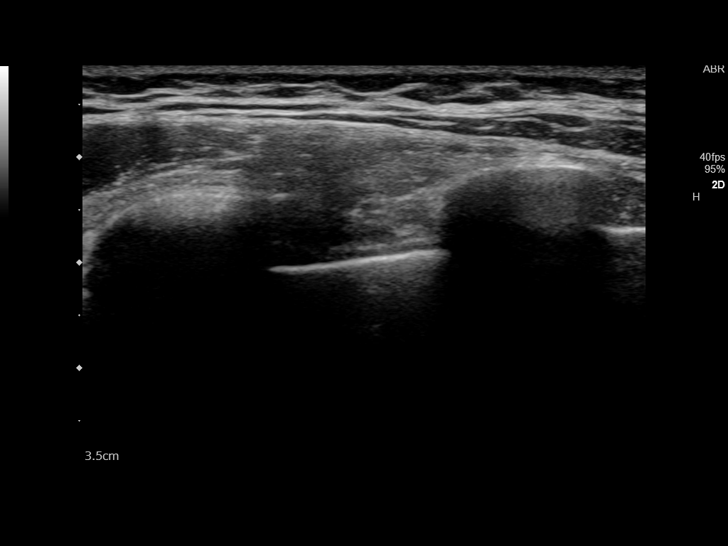
[im 15/28]
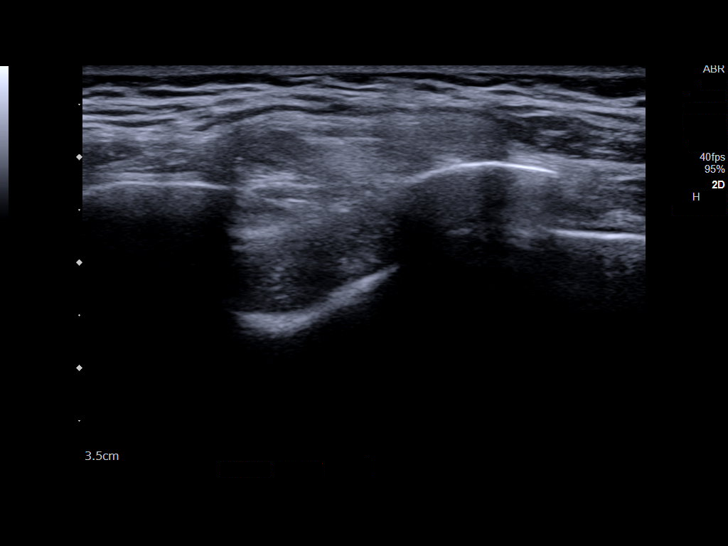
[im 17/28]
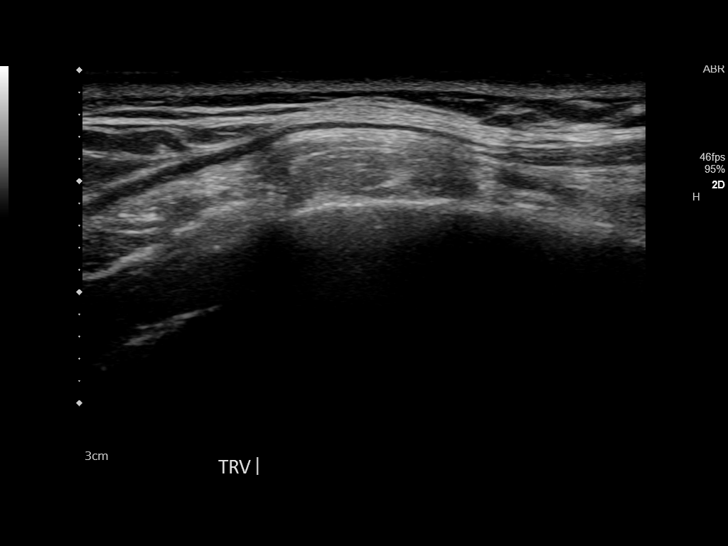
[im 19/28]
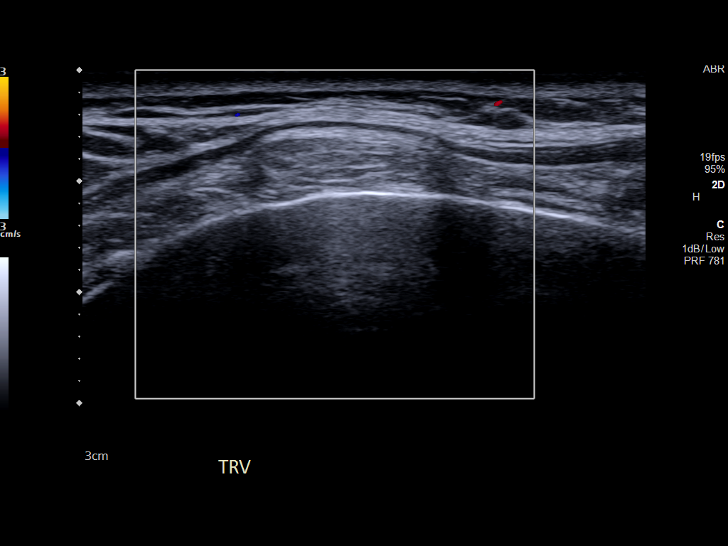
[im 22/28]
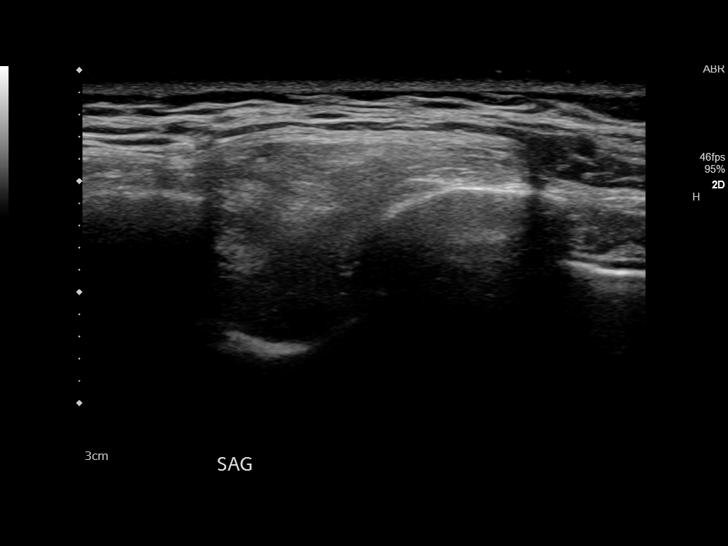
[im 24/28]
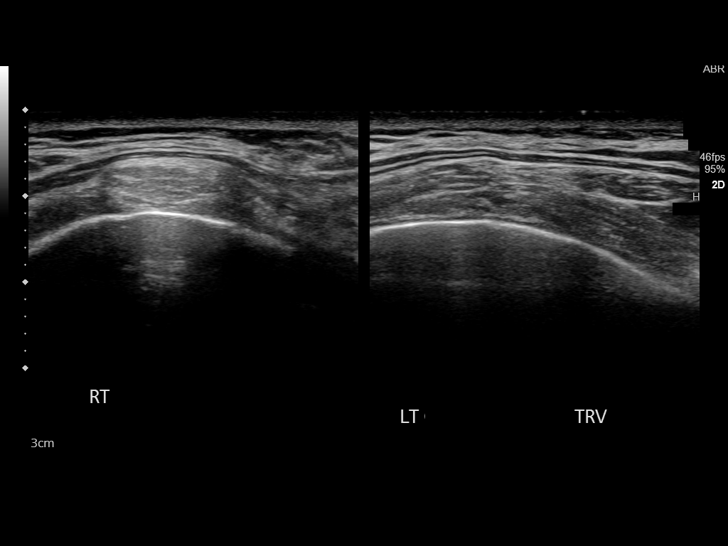
[im 26/28]
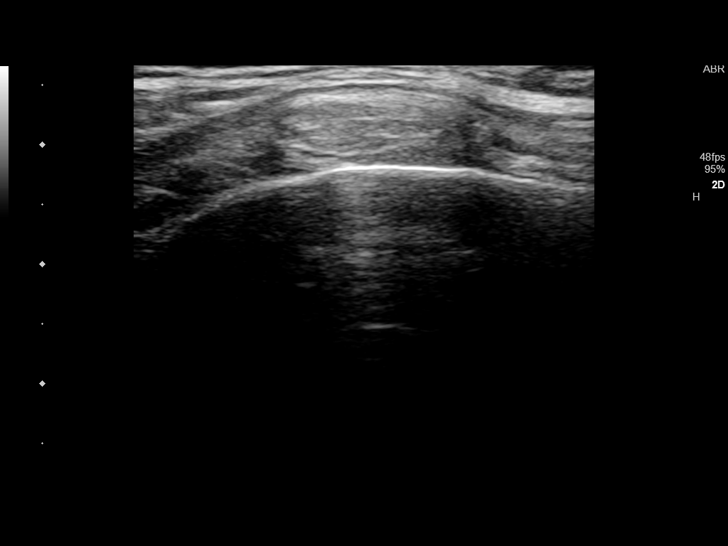
[im 28/28]
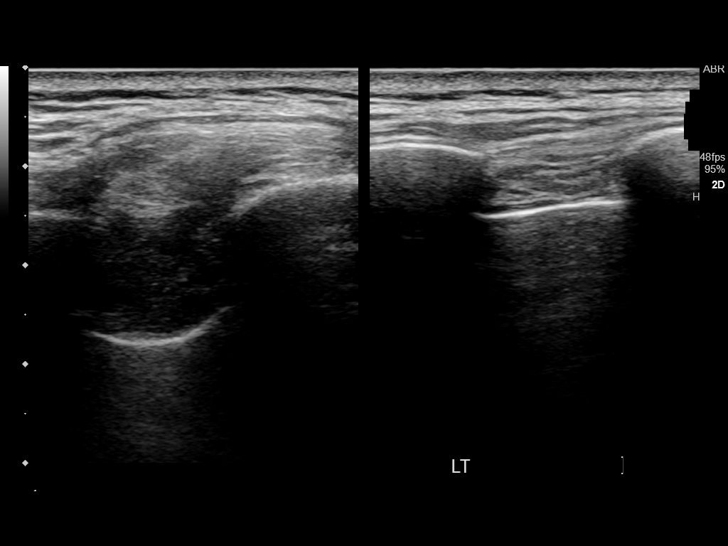

[14 of 16 positions shown; findings below may reference images not displayed]

FINDINGS: Sonography was performed at the site of clinical concern.

At this site, an ovoid heterogeneous minimally hyperechoic nodule is
identified measuring 2.3 x 1.6 cm in greatest axial dimensions and
up to 1 cm thick.

This appears to be within the muscle layer rather than within
subcutaneous fat.

No calcifications or cystic regions identified.

No internal blood flow on color Doppler imaging.
IMPRESSION: 2.3 x 1.6 x 1.0 cm diameter subcutaneous mass identified at the site
of clinical concern at the RIGHT back, heterogeneous slightly
hyperechoic and appearing to be located within a muscle air rather
than subcutaneous fat.

Lesion is nonspecific; differential diagnosis could include
intramuscular lipoma, fibroma, focal area of muscular injury, other
masses not excluded.

Consider further characterization by MR imaging.

## 2019-11-12 ENCOUNTER — Other Ambulatory Visit: Payer: Self-pay | Admitting: Nurse Practitioner

## 2019-11-12 ENCOUNTER — Encounter: Payer: Self-pay | Admitting: Nurse Practitioner

## 2019-11-12 DIAGNOSIS — R222 Localized swelling, mass and lump, trunk: Secondary | ICD-10-CM

## 2019-11-12 NOTE — Progress Notes (Signed)
Per recent ultrasound report to mass on back, it is recommended for MR for characterization of area.  Spoke to patient about results and order placed.

## 2019-11-22 ENCOUNTER — Other Ambulatory Visit: Payer: Self-pay | Admitting: Nurse Practitioner

## 2019-11-22 NOTE — Addendum Note (Signed)
Addended by: Marnee Guarneri T on: 11/22/2019 11:52 AM   Modules accepted: Orders

## 2019-11-23 ENCOUNTER — Encounter: Payer: Self-pay | Admitting: Nurse Practitioner

## 2019-11-30 ENCOUNTER — Encounter: Payer: Self-pay | Admitting: Nurse Practitioner

## 2019-12-02 ENCOUNTER — Ambulatory Visit
Admission: RE | Admit: 2019-12-02 | Discharge: 2019-12-02 | Disposition: A | Discharge status: Home / Self Care | Payer: BLUE CROSS/BLUE SHIELD | Source: Ambulatory Visit | Attending: Nurse Practitioner | Admitting: Nurse Practitioner

## 2019-12-02 ENCOUNTER — Other Ambulatory Visit: Payer: Self-pay

## 2019-12-02 DIAGNOSIS — R222 Localized swelling, mass and lump, trunk: Secondary | ICD-10-CM

## 2019-12-02 IMAGING — MR MR THORACIC SPINE WO/W CM
9 series · 48 of 48 positions shown · IV contrast (gadavist)
Comparison: Ultrasound exam dated [DATE]

CLINICAL DATA: Soft tissue mass of the right side of back.

EXAM:
MRI THORACIC WITHOUT AND WITH CONTRAST
TECHNIQUE: Multiplanar and multiecho pulse sequences of the thoracic spine were
obtained without and with intravenous contrast.
CONTRAST:  6mL GADAVIST GADOBUTROL 1 MMOL/ML IV SOLN

[Series 5: T1 · axial · 4.0mm · 0.44mm/px · z∈[-62,+83]mm · 7 of 30 slices shown (1 of 2)]
[im 1/30]
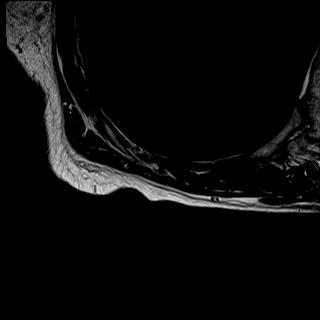
[im 5/30]
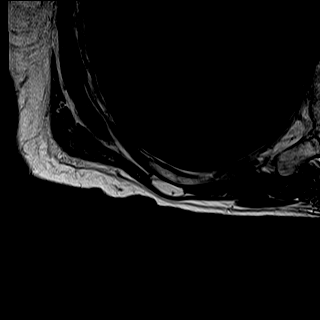
[im 10/30]
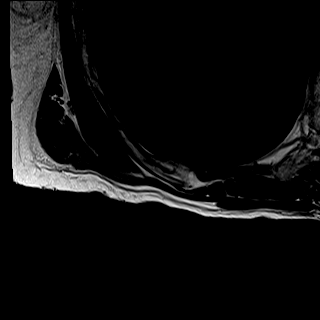
[im 15/30]
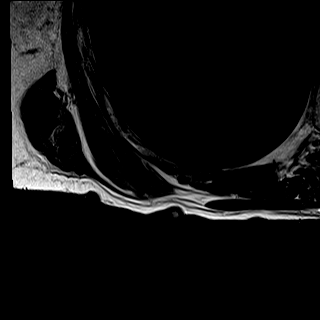
[im 20/30]
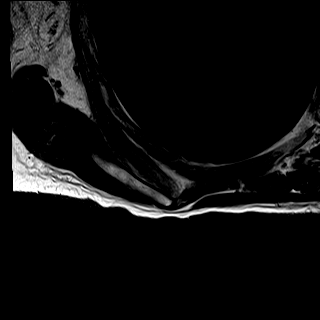
[im 25/30]
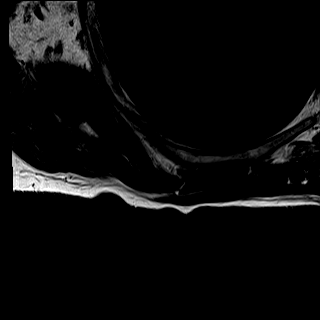
[im 30/30]
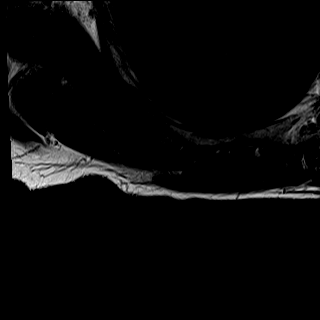

[Series 6: T1 fat-sat · axial · 4.0mm · 0.44mm/px · z∈[-62,+83]mm · 6 of 30 slices shown]
[im 1/30]
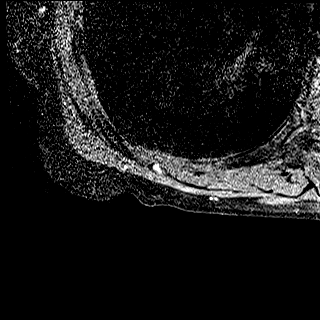
[im 6/30]
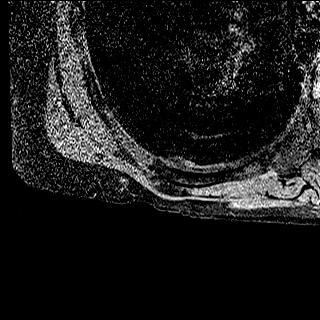
[im 12/30]
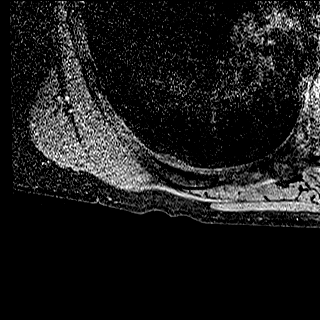
[im 18/30]
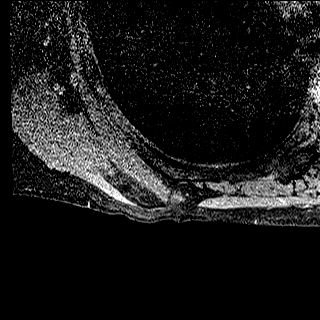
[im 24/30]
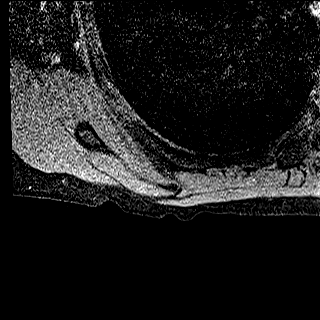
[im 30/30]
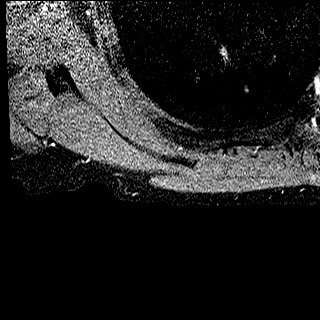

[Series 7: T2 fat-sat · axial · 4.0mm · 0.40mm/px · z∈[-62,+83]mm · 6 of 30 slices shown (1 of 2)]
[im 1/30]
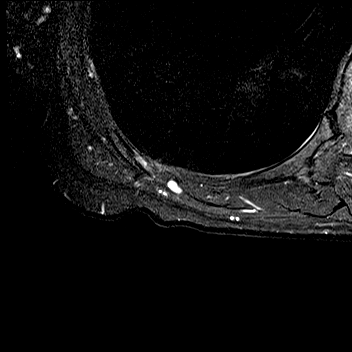
[im 6/30]
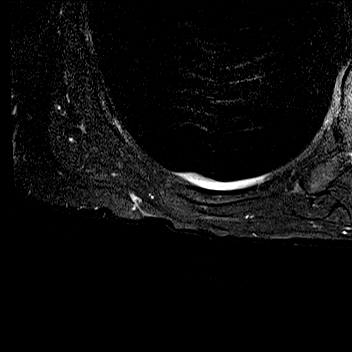
[im 12/30]
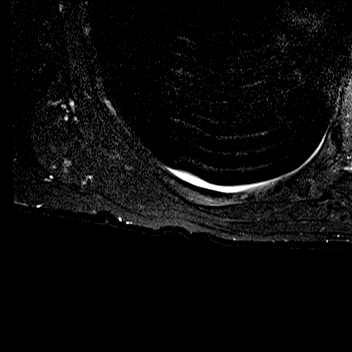
[im 18/30]
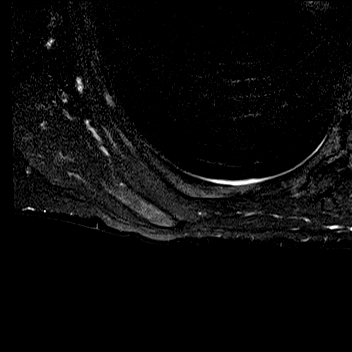
[im 24/30]
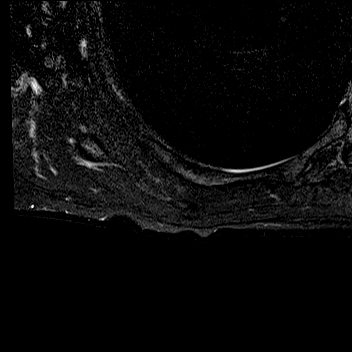
[im 30/30]
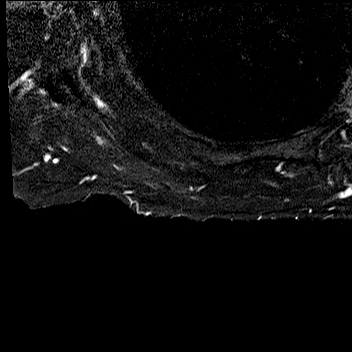

[Series 8: T1 · coronal · 4.0mm · 0.46mm/px · 3 of 15 slices shown (2 of 2)]
[im 1/15]
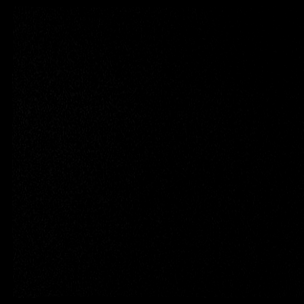
[im 8/15]
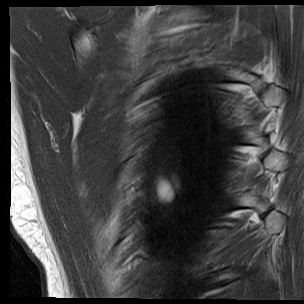
[im 15/15]
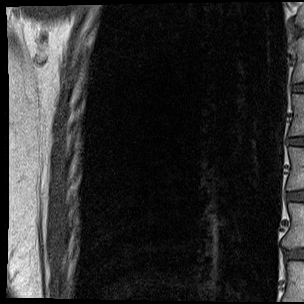

[Series 9: STIR · sagittal · 4.0mm · 0.49mm/px · 7 of 31 slices shown]
[im 1/31]
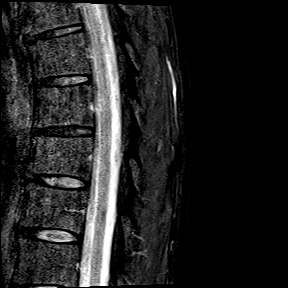
[im 6/31]
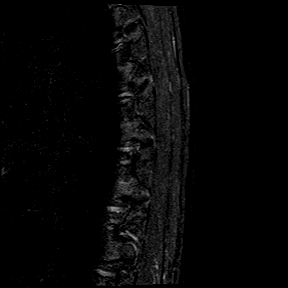
[im 11/31]
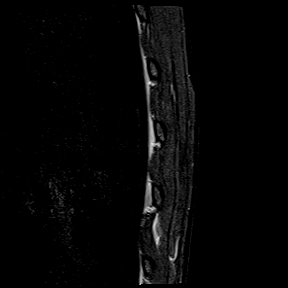
[im 16/31]
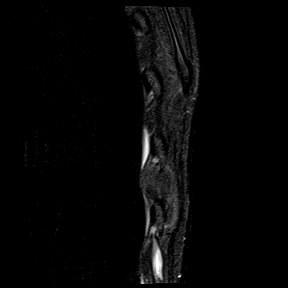
[im 21/31]
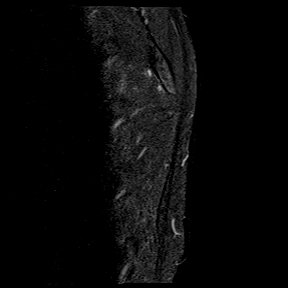
[im 26/31]
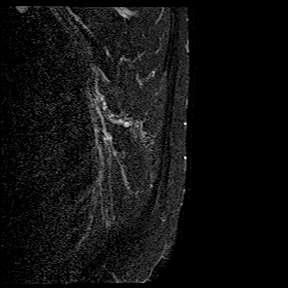
[im 31/31]
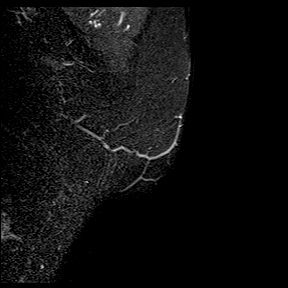

[Series 10: T2 fat-sat · coronal · 4.0mm · 0.46mm/px · 3 of 15 slices shown (2 of 2)]
[im 1/15]
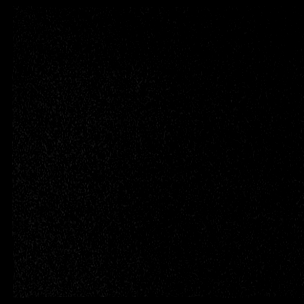
[im 8/15]
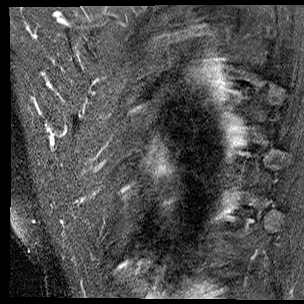
[im 15/15]
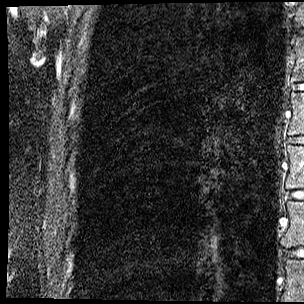

[Series 11: T1 fat-sat post-contrast · axial · 4.0mm · 0.44mm/px · z∈[-62,+83]mm · 6 of 30 slices shown (1 of 3)]
[im 1/30]
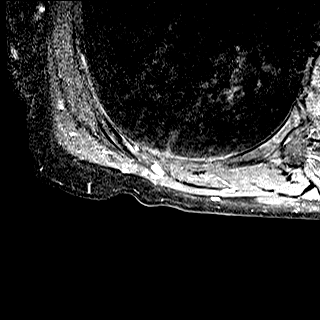
[im 6/30]
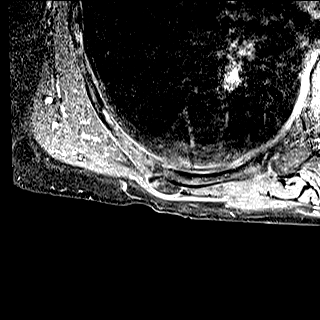
[im 12/30]
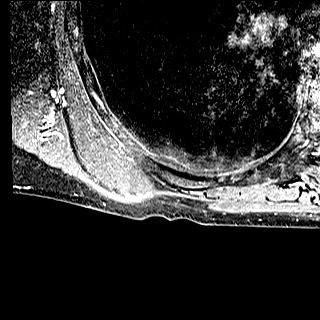
[im 18/30]
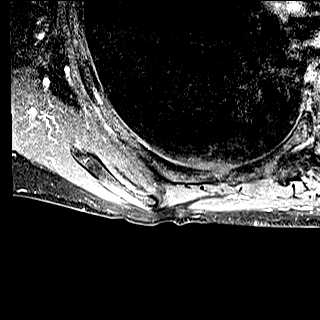
[im 24/30]
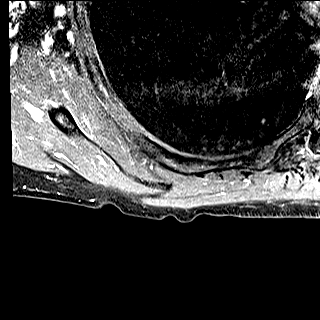
[im 30/30]
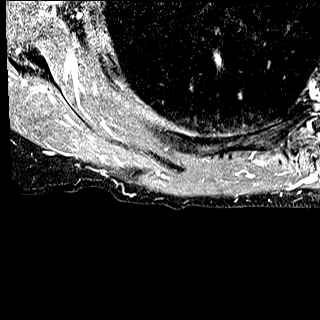

[Series 12: T1 fat-sat post-contrast · sagittal · 4.0mm · 0.55mm/px · 7 of 31 slices shown (2 of 3)]
[im 1/31]
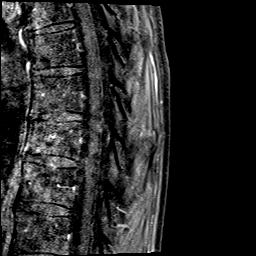
[im 6/31]
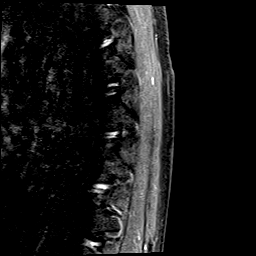
[im 11/31]
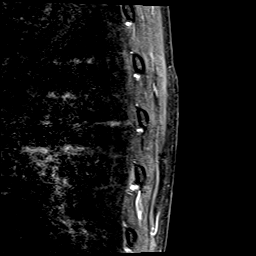
[im 16/31]
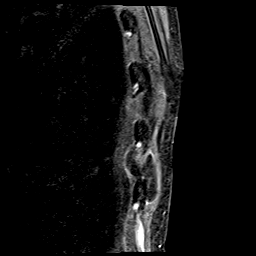
[im 21/31]
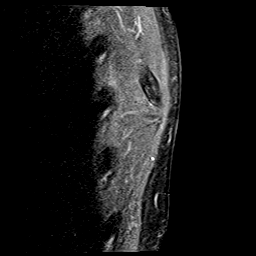
[im 26/31]
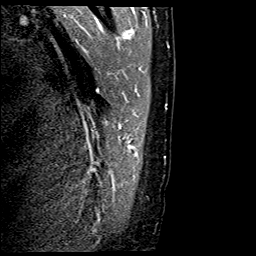
[im 31/31]
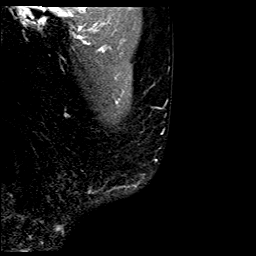

[Series 13: T1 fat-sat post-contrast · coronal · 4.0mm · 0.46mm/px · 3 of 15 slices shown (3 of 3)]
[im 1/15]
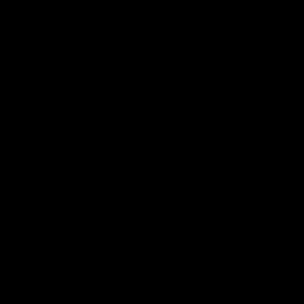
[im 8/15]
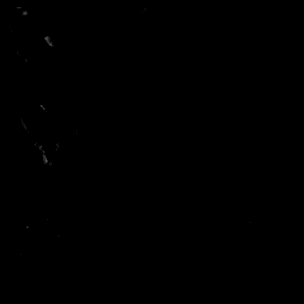
[im 15/15]
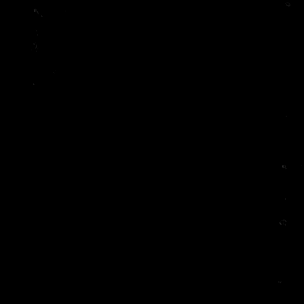

[48 of 48 positions shown; findings below may reference images not displayed]

FINDINGS: There is a 3.6 x 2.5 x 1.8 cm lobulated lipoma in the posterior
right chest wall protruding between 2 ribs into the right
hemithorax. There is no significant non-fatty component to this
benign-appearing lipoma. Is deep to the chest wall musculature. The
adjacent ribs appear normal. The adjacent vessels are normal. There
is slight enhancement of the margins of the mass after contrast
administration but there is no internal enhancement of significance.
IMPRESSION: Benign-appearing lipoma in the posterior right chest wall. I suspect
that surgical removal would be complicated. If the lesion
progresses, this could be re-evaluated but the current exam
demonstrates no worrisome characteristics.

## 2019-12-02 MED ORDER — GADOBUTROL 1 MMOL/ML IV SOLN
6.0000 mL | Freq: Once | INTRAVENOUS | Status: AC | PRN
  Administered 2019-12-02: 12:00:00 6 mL via INTRAVENOUS

## 2019-12-02 NOTE — Progress Notes (Signed)
Reviewed with patient via telephone, will watch area and if any enlargement or worsening will reassess.

## 2020-01-12 ENCOUNTER — Encounter: Payer: Self-pay | Admitting: Nurse Practitioner

## 2020-01-13 ENCOUNTER — Other Ambulatory Visit: Payer: Self-pay | Admitting: Nurse Practitioner

## 2020-01-13 DIAGNOSIS — Z974 Presence of external hearing-aid: Secondary | ICD-10-CM

## 2020-01-13 NOTE — Progress Notes (Signed)
Hearing aid check and cleaning

## 2020-02-29 ENCOUNTER — Ambulatory Visit: Payer: No Typology Code available for payment source | Admitting: Nurse Practitioner

## 2020-02-29 ENCOUNTER — Other Ambulatory Visit: Payer: Self-pay

## 2020-02-29 ENCOUNTER — Encounter: Payer: Self-pay | Admitting: Nurse Practitioner

## 2020-02-29 VITALS — BP 152/84 | HR 65 | Temp 98.2°F | Wt 140.8 lb

## 2020-02-29 DIAGNOSIS — N898 Other specified noninflammatory disorders of vagina: Secondary | ICD-10-CM | POA: Insufficient documentation

## 2020-02-29 LAB — WET PREP FOR TRICH, YEAST, CLUE
Clue Cell Exam: NEGATIVE
Trichomonas Exam: NEGATIVE
Yeast Exam: NEGATIVE

## 2020-02-29 MED ORDER — FLUCONAZOLE 150 MG PO TABS: 150.0000 mg | ORAL_TABLET | Freq: Once | ORAL | 0 refills | Status: AC

## 2020-02-29 NOTE — Assessment & Plan Note (Signed)
Reports ongoing since Sunday with some improvement using OTC AZO Yeast.  Wet prep negative, she is unable to provide urine sample today and lives 30 minutes away, does not want to take sample cup home and return.  Reports symptoms are only on outside.  Will send in Diflucan x 1 dose to take as needed if ongoing or worsening symptoms, hold statin x 24 hours if takes.  Have advised her for worsening or ongoing symptoms then to return to office and will obtain urine sample and perform further assessment.

## 2020-02-29 NOTE — Patient Instructions (Signed)
Vaginal Yeast Infection, Adult  Vaginal yeast infection is a condition that causes vaginal discharge as well as soreness, swelling, and redness (inflammation) of the vagina. This is a common condition. Some women get this infection frequently. What are the causes? This condition is caused by a change in the normal balance of the yeast (candida) and bacteria that live in the vagina. This change causes an overgrowth of yeast, which causes the inflammation. What increases the risk? The condition is more likely to develop in women who:  Take antibiotic medicines.  Have diabetes.  Take birth control pills.  Are pregnant.  Douche often.  Have a weak body defense system (immune system).  Have been taking steroid medicines for a long time.  Frequently wear tight clothing. What are the signs or symptoms? Symptoms of this condition include:  White, thick, creamy vaginal discharge.  Swelling, itching, redness, and irritation of the vagina. The lips of the vagina (vulva) may be affected as well.  Pain or a burning feeling while urinating.  Pain during sex. How is this diagnosed? This condition is diagnosed based on:  Your medical history.  A physical exam.  A pelvic exam. Your health care provider will examine a sample of your vaginal discharge under a microscope. Your health care provider may send this sample for testing to confirm the diagnosis. How is this treated? This condition is treated with medicine. Medicines may be over-the-counter or prescription. You may be told to use one or more of the following:  Medicine that is taken by mouth (orally).  Medicine that is applied as a cream (topically).  Medicine that is inserted directly into the vagina (suppository). Follow these instructions at home:  Lifestyle  Do not have sex until your health care provider approves. Tell your sex partner that you have a yeast infection. That person should go to his or her health care  provider and ask if they should also be treated.  Do not wear tight clothes, such as pantyhose or tight pants.  Wear breathable cotton underwear. General instructions  Take or apply over-the-counter and prescription medicines only as told by your health care provider.  Eat more yogurt. This may help to keep your yeast infection from returning.  Do not use tampons until your health care provider approves.  Try taking a sitz bath to help with discomfort. This is a warm water bath that is taken while you are sitting down. The water should only come up to your hips and should cover your buttocks. Do this 3-4 times per day or as told by your health care provider.  Do not douche.  If you have diabetes, keep your blood sugar levels under control.  Keep all follow-up visits as told by your health care provider. This is important. Contact a health care provider if:  You have a fever.  Your symptoms go away and then return.  Your symptoms do not get better with treatment.  Your symptoms get worse.  You have new symptoms.  You develop blisters in or around your vagina.  You have blood coming from your vagina and it is not your menstrual period.  You develop pain in your abdomen. Summary  Vaginal yeast infection is a condition that causes discharge as well as soreness, swelling, and redness (inflammation) of the vagina.  This condition is treated with medicine. Medicines may be over-the-counter or prescription.  Take or apply over-the-counter and prescription medicines only as told by your health care provider.  Do not douche.   Do not have sex or use tampons until your health care provider approves.  Contact a health care provider if your symptoms do not get better with treatment or your symptoms go away and then return. This information is not intended to replace advice given to you by your health care provider. Make sure you discuss any questions you have with your health care  provider. Document Revised: 04/02/2019 Document Reviewed: 01/19/2018 Elsevier Patient Education  2020 Elsevier Inc.  

## 2020-02-29 NOTE — Progress Notes (Signed)
BP (!) 152/84   Pulse 65   Temp 98.2 F (36.8 C) (Oral)   Wt 140 lb 12.8 oz (63.9 kg)   SpO2 99%   BMI 22.52 kg/m    Subjective:    Patient ID: April Raymond, female    DOB: October 15, 1960, 59 y.o.   MRN: [id number]  HPI: April Raymond is a 59 y.o. female  Chief Complaint  Patient presents with  . Vaginal Itching    pt states she has had vaginal itching and burning since Sunday    VAGINAL DISCHARGE Started with symptoms on Sunday, then tried Monistat which she felt made it worse.  Currently taking Azo Yeast, which she reports is helping some. Duration: days Discharge description: none noticed  Pruritus: yes Dysuria: none Malodorous: no Urinary frequency: no Fevers: no Abdominal pain: no  Sexual activity: monogamous History of sexually transmitted diseases: no Recent antibiotic use: no Context: stable  Treatments attempted: none  Relevant past medical, surgical, family and social history reviewed and updated as indicated. Interim medical history since our last visit reviewed. Allergies and medications reviewed and updated.  Review of Systems  Constitutional: Negative for activity change, appetite change, diaphoresis, fatigue and fever.  Respiratory: Negative for cough, chest tightness and shortness of breath.   Cardiovascular: Negative for chest pain, palpitations and leg swelling.  Gastrointestinal: Negative.   Genitourinary: Positive for dysuria. Negative for frequency, hematuria and urgency.  Neurological: Negative.   Psychiatric/Behavioral: Negative.     Per HPI unless specifically indicated above     Objective:    BP (!) 152/84   Pulse 65   Temp 98.2 F (36.8 C) (Oral)   Wt 140 lb 12.8 oz (63.9 kg)   SpO2 99%   BMI 22.52 kg/m   Wt Readings from Last 3 Encounters:  02/29/20 140 lb 12.8 oz (63.9 kg)  11/05/19 146 lb 6.4 oz (66.4 kg)  10/29/19 145 lb (65.8 kg)    Physical Exam Vitals and nursing note reviewed.  Constitutional:      General: She is  awake. She is not in acute distress.    Appearance: She is well-developed and well-groomed. She is not ill-appearing.  HENT:     Head: Normocephalic.     Right Ear: Hearing normal.     Left Ear: Hearing normal.  Eyes:     General: Lids are normal.        Right eye: No discharge.        Left eye: No discharge.     Conjunctiva/sclera: Conjunctivae normal.     Pupils: Pupils are equal, round, and reactive to light.  Cardiovascular:     Rate and Rhythm: Normal rate and regular rhythm.     Heart sounds: Normal heart sounds. No murmur heard.  No gallop.   Pulmonary:     Effort: Pulmonary effort is normal. No accessory muscle usage or respiratory distress.     Breath sounds: Normal breath sounds.  Abdominal:     General: Bowel sounds are normal.     Palpations: Abdomen is soft.  Musculoskeletal:     Cervical back: Normal range of motion and neck supple.     Right lower leg: No edema.     Left lower leg: No edema.  Skin:    General: Skin is warm and dry.  Neurological:     Mental Status: She is alert and oriented to person, place, and time.  Psychiatric:        Attention and Perception: Attention normal.  Mood and Affect: Mood normal.        Speech: Speech normal.        Behavior: Behavior normal. Behavior is cooperative.        Thought Content: Thought content normal.     Results for orders placed or performed in visit on 11/05/19  HM MAMMOGRAPHY  Result Value Ref Range   HM Mammogram 0-4 Bi-Rad 0-4 Bi-Rad, Self Reported Normal  Comprehensive metabolic panel  Result Value Ref Range   Glucose 86 65 - 99 mg/dL   BUN 18 6 - 24 mg/dL   Creatinine, Ser 1.00 0.57 - 1.00 mg/dL   GFR calc non Af Amer 62 >59 mL/min/1.73   GFR calc Af Amer 71 >59 mL/min/1.73   BUN/Creatinine Ratio 18 9 - 23   Sodium 135 134 - 144 mmol/L   Potassium 4.4 3.5 - 5.2 mmol/L   Chloride 99 96 - 106 mmol/L   CO2 24 20 - 29 mmol/L   Calcium 9.8 8.7 - 10.2 mg/dL   Total Protein 6.7 6.0 - 8.5 g/dL     Albumin 4.8 3.8 - 4.9 g/dL   Globulin, Total 1.9 1.5 - 4.5 g/dL   Albumin/Globulin Ratio 2.5 (H) 1.2 - 2.2   Bilirubin Total 0.4 0.0 - 1.2 mg/dL   Alkaline Phosphatase 39 39 - 117 IU/L   AST 26 0 - 40 IU/L   ALT 24 0 - 32 IU/L  CBC with Differential/Platelet  Result Value Ref Range   WBC 6.1 3.4 - 10.8 x10E3/uL   RBC 3.76 (L) 3.77 - 5.28 x10E6/uL   Hemoglobin 12.0 11.1 - 15.9 g/dL   Hematocrit 36.2 34.0 - 46.6 %   MCV 96 79 - 97 fL   MCH 31.9 26.6 - 33.0 pg   MCHC 33.1 31 - 35 g/dL   RDW 12.7 11.7 - 15.4 %   Platelets 359 150 - 450 x10E3/uL   Neutrophils 56 Not Estab. %   Lymphs 32 Not Estab. %   Monocytes 7 Not Estab. %   Eos 3 Not Estab. %   Basos 2 Not Estab. %   Neutrophils Absolute 3.5 1 - 7 x10E3/uL   Lymphocytes Absolute 1.9 0 - 3 x10E3/uL   Monocytes Absolute 0.4 0 - 0 x10E3/uL   EOS (ABSOLUTE) 0.2 0.0 - 0.4 x10E3/uL   Basophils Absolute 0.1 0 - 0 x10E3/uL   Immature Granulocytes 0 Not Estab. %   Immature Grans (Abs) 0.0 0.0 - 0.1 x10E3/uL  Lipid Panel w/o Chol/HDL Ratio  Result Value Ref Range   Cholesterol, Total 144 100 - 199 mg/dL   Triglycerides 138 0 - 149 mg/dL   HDL 64 >39 mg/dL   VLDL Cholesterol Cal 24 5 - 40 mg/dL   LDL Chol Calc (NIH) 56 0 - 99 mg/dL  Magnesium  Result Value Ref Range   Magnesium 2.1 1.6 - 2.3 mg/dL  VITAMIN D 25 Hydroxy (Vit-D Deficiency, Fractures)  Result Value Ref Range   Vit D, 25-Hydroxy 40.4 30.0 - 100.0 ng/mL  Thyroid Panel With TSH  Result Value Ref Range   TSH 3.740 0.450 - 4.500 uIU/mL   T4, Total 6.9 4.5 - 12.0 ug/dL   T3 Uptake Ratio 28 24 - 39 %   Free Thyroxine Index 1.9 1.2 - 4.9      Assessment & Plan:   Problem List Items Addressed This Visit      Musculoskeletal and Integument   Vaginal pruritus - Primary    Reports ongoing since  Sunday with some improvement using OTC AZO Yeast.  Wet prep negative, she is unable to provide urine sample today and lives 30 minutes away, does not want to take sample cup  home and return.  Reports symptoms are only on outside.  Will send in Diflucan x 1 dose to take as needed if ongoing or worsening symptoms, hold statin x 24 hours if takes.  Have advised her for worsening or ongoing symptoms then to return to office and will obtain urine sample and perform further assessment.      Relevant Orders   WET PREP FOR [place], YEAST, CLUE   UA/M w/rflx Culture, Routine       Follow up plan: Return if symptoms worsen or fail to improve.

## 2020-03-06 ENCOUNTER — Encounter: Payer: Self-pay | Admitting: Emergency Medicine

## 2020-03-06 ENCOUNTER — Other Ambulatory Visit: Payer: Self-pay

## 2020-03-06 ENCOUNTER — Ambulatory Visit
Admission: EM | Admit: 2020-03-06 | Discharge: 2020-03-06 | Disposition: A | Discharge status: Home / Self Care | Payer: BLUE CROSS/BLUE SHIELD | Attending: Emergency Medicine | Admitting: Emergency Medicine

## 2020-03-06 ENCOUNTER — Other Ambulatory Visit: Payer: Self-pay | Admitting: Nurse Practitioner

## 2020-03-06 DIAGNOSIS — N898 Other specified noninflammatory disorders of vagina: Secondary | ICD-10-CM

## 2020-03-06 LAB — POCT URINALYSIS DIP (MANUAL ENTRY)
Bilirubin, UA: NEGATIVE
Blood, UA: NEGATIVE
Glucose, UA: NEGATIVE mg/dL
Ketones, POC UA: NEGATIVE mg/dL
Leukocytes, UA: NEGATIVE
Nitrite, UA: NEGATIVE
Protein Ur, POC: NEGATIVE mg/dL
Spec Grav, UA: 1.01 (ref 1.010–1.025)
Urobilinogen, UA: 0.2 E.U./dL
pH, UA: 7 (ref 5.0–8.0)

## 2020-03-06 MED ORDER — CLOTRIMAZOLE 1 % VA CREA: 1.0000 | TOPICAL_CREAM | Freq: Every day | VAGINAL | 0 refills | Status: DC

## 2020-03-06 MED ORDER — FLUCONAZOLE 150 MG PO TABS: 150.0000 mg | ORAL_TABLET | Freq: Once | ORAL | 0 refills | Status: AC

## 2020-03-06 MED ORDER — AMOXICILLIN-POT CLAVULANATE 875-125 MG PO TABS: 1.0000 | ORAL_TABLET | Freq: Two times a day (BID) | ORAL | 0 refills | Status: AC

## 2020-03-06 NOTE — ED Triage Notes (Signed)
Pt c/o lower abd pain, frequent urination, and burning x 10 days. Pt saw her PCP last week and was treated for what she thought was a yeast infection. She states now the pain is much worse.

## 2020-03-06 NOTE — ED Provider Notes (Signed)
Roderic Palau    CSN: [id number] Arrival date & time: 03/06/20  1133      History   Chief Complaint Chief Complaint  Patient presents with  . Urinary Tract Infection    HPI April Raymond is a 59 y.o. female.   Patient presents with vaginal itching, burning with urination, urinary frequency, lower abdominal pain x10 days.  She was seen by her PCP on 02/29/2020; treated with Diflucan.  Her PCP called in an antibiotic today but wanted her to come here for urine specimen.  Patient denies fever, chills, vaginal discharge, pelvic pain, back pain, vomiting, diarrhea, constipation, or other symptoms.  Treatment also attempted at home with OTC Azo.  The history is provided by the patient.    Past Medical History:  Diagnosis Date  . CAD (coronary artery disease)   . Hashimoto's disease   . Hidradenitis suppurativa   . Hydradenitis   . Hyperparathyroidism ([place])   . Hypertension   . Myocardial infarction [facility])     Patient Active Problem List   Diagnosis Date Noted  . Vaginal pruritus 02/29/2020  . Mass of skin of back 11/05/2019  . Former smoker 09/30/2019  . History of MI (myocardial infarction) 09/30/2019  . History of parathyroidectomy 09/30/2019  . CAD (coronary artery disease) 09/30/2019  . Essential hypertension 09/30/2019  . Hyperlipidemia 09/30/2019  . Hashimoto's disease 09/30/2019  . Vaginal atrophy 09/30/2019  . Hidradenitis suppurativa 09/30/2019    Past Surgical History:  Procedure Laterality Date  . CYST EXCISION  x2  . PARATHYROIDECTOMY    . TONSILLECTOMY      OB History   No obstetric history on file.      Home Medications    Prior to Admission medications   Medication Sig Start Date End Date Taking? Authorizing Provider  amLODipine (NORVASC) 2.5 MG tablet Take 1 tablet (2.5 mg total) by mouth daily. 11/05/19  Yes Cannady, Jolene T, NP  atorvastatin (LIPITOR) 20 MG tablet Take 1 tablet (20 mg total) by mouth daily at 6 PM. 11/05/19  Yes  Cannady, Jolene T, NP  Estradiol 10 MCG INST Place 1 tablet vaginally 2 (two) times a week. 11/08/19  Yes Cannady, Henrine Screws T, NP  levothyroxine (SYNTHROID) 75 MCG tablet Take 1 tablet (75 mcg total) by mouth daily before breakfast. 11/05/19  Yes Cannady, Jolene T, NP  metoprolol tartrate (LOPRESSOR) 25 MG tablet Take 1 tablet (25 mg total) by mouth daily. 11/05/19  Yes Cannady, Jolene T, NP  pantoprazole (PROTONIX) 40 MG tablet Take 1 tablet (40 mg total) by mouth daily. 11/05/19  Yes Cannady, Henrine Screws T, NP  ticagrelor (BRILINTA) 60 MG TABS tablet Take 1 tablet (60 mg total) by mouth 2 (two) times daily. 11/05/19  Yes Cannady, Jolene T, NP  amoxicillin-clavulanate (AUGMENTIN) 875-125 MG tablet Take 1 tablet by mouth 2 (two) times daily for 7 days. 03/06/20 03/13/20  Marnee Guarneri T, NP  clotrimazole (GYNE-LOTRIMIN) 1 % vaginal cream Place 1 Applicatorful vaginally at bedtime. 03/06/20   Sharion Balloon, NP  fluconazole (DIFLUCAN) 150 MG tablet Take 1 tablet (150 mg total) by mouth once for 1 dose. 03/06/20 03/06/20  Cannady, Henrine Screws T, NP  nitroGLYCERIN (NITROSTAT) 0.4 MG SL tablet Place 1 tablet (0.4 mg total) under the tongue every 5 (five) minutes as needed for chest pain. 11/05/19 02/03/20  End, Harrell Gave, MD    Family History Family History  Problem Relation Age of Onset  . Heart attack Mother 43  . COPD Mother   .  Stroke Mother   . Diverticulitis Mother   . Cancer Mother        breast  . Hypertension Mother   . Hypertension Father   . Hypertension Brother   . Stroke Maternal Grandfather   . Alzheimer's disease Paternal Grandmother   . Heart attack Paternal Grandfather     Social History Social History   Tobacco Use  . Smoking status: Former Smoker    Packs/day: 1.00    Years: 40.00    Pack years: 40.00    Types: Cigarettes    Quit date: 09/18/2015    Years since quitting: 4.4  . Smokeless tobacco: Never Used  Vaping Use  . Vaping Use: Never used  Substance Use Topics  . Alcohol  use: Yes    Comment: on occasion  . Drug use: Never     Allergies   Humira [adalimumab]   Review of Systems Review of Systems  Constitutional: Negative for chills and fever.  HENT: Negative for ear pain and sore throat.   Eyes: Negative for pain and visual disturbance.  Respiratory: Negative for cough and shortness of breath.   Cardiovascular: Negative for chest pain and palpitations.  Gastrointestinal: Positive for abdominal pain. Negative for diarrhea, nausea and vomiting.  Genitourinary: Positive for dysuria and frequency. Negative for flank pain, hematuria, pelvic pain and vaginal discharge.  Musculoskeletal: Negative for arthralgias and back pain.  Skin: Negative for color change and rash.  Neurological: Negative for seizures and syncope.  All other systems reviewed and are negative.    Physical Exam Triage Vital Signs ED Triage Vitals  Enc Vitals Group     BP      Pulse      Resp      Temp      Temp src      SpO2      Weight      Height      Head Circumference      Peak Flow      Pain Score      Pain Loc      Pain Edu?      Excl. in [place]?    No data found.  Updated Vital Signs LMP 11/16/2007 (Approximate)   Visual Acuity Right Eye Distance:   Left Eye Distance:   Bilateral Distance:    Right Eye Near:   Left Eye Near:    Bilateral Near:     Physical Exam Vitals and nursing note reviewed.  Constitutional:      General: She is not in acute distress.    Appearance: She is well-developed. She is not ill-appearing.  HENT:     Head: Normocephalic and atraumatic.     Mouth/Throat:     Mouth: Mucous membranes are moist.  Eyes:     Conjunctiva/sclera: Conjunctivae normal.  Cardiovascular:     Rate and Rhythm: Normal rate and regular rhythm.     Heart sounds: No murmur heard.   Pulmonary:     Effort: Pulmonary effort is normal. No respiratory distress.     Breath sounds: Normal breath sounds.  Abdominal:     General: There is no distension.      Palpations: Abdomen is soft.     Tenderness: There is no abdominal tenderness. There is no right CVA tenderness, left CVA tenderness, guarding or rebound.  Genitourinary:    Vagina: No vaginal discharge.     Cervix: Normal.     Uterus: Normal.      Adnexa: Right adnexa  normal and left adnexa normal.     Comments: Mild redness at vaginal introitus. No vaginal discharge.   Musculoskeletal:     Cervical back: Neck supple.  Skin:    General: Skin is warm and dry.  Neurological:     General: No focal deficit present.     Mental Status: She is alert and oriented to person, place, and time.     Gait: Gait normal.  Psychiatric:        Mood and Affect: Mood normal.        Behavior: Behavior normal.      UC Treatments / Results  Labs (all labs ordered are listed, but only abnormal results are displayed) Labs Reviewed  POCT URINALYSIS DIP (MANUAL ENTRY)  CERVICOVAGINAL ANCILLARY ONLY    EKG   Radiology No results found.  Procedures Procedures (including critical care time)  Medications Ordered in UC Medications - No data to display  Initial Impression / Assessment and Plan / UC Course  I have reviewed the triage vital signs and the nursing notes.  Pertinent labs & imaging results that were available during my care of the patient were reviewed by me and considered in my medical decision making (see chart for details).   Vaginal irritation.  Urine normal.  Vaginal swab obtained for testing.  Treating with clotrimazole vaginal cream.  Instructed patient to follow-up with her PCP if her symptoms are not improving.  Patient agrees to plan of care.      Final Clinical Impressions(s) / UC Diagnoses   Final diagnoses:  Vaginal irritation     Discharge Instructions     Use the vaginal cream as directed.    Follow up with your primary care provider if your symptoms are not improving.       ED Prescriptions    Medication Sig Dispense Auth. Provider   clotrimazole  (GYNE-LOTRIMIN) 1 % vaginal cream Place 1 Applicatorful vaginally at bedtime. 45 g Sharion Balloon, NP     PDMP not reviewed this encounter.   Sharion Balloon, NP 03/06/20 1239

## 2020-03-06 NOTE — Discharge Instructions (Addendum)
Use the vaginal cream as directed.    Follow up with your primary care provider if your symptoms are not improving.

## 2020-03-07 ENCOUNTER — Telehealth (HOSPITAL_COMMUNITY): Payer: Self-pay | Admitting: Orthopedic Surgery

## 2020-03-07 LAB — CERVICOVAGINAL ANCILLARY ONLY
Bacterial Vaginitis (gardnerella): POSITIVE — AB
Candida Glabrata: NEGATIVE
Candida Vaginitis: NEGATIVE
Chlamydia: NEGATIVE
Comment: NEGATIVE
Comment: NEGATIVE
Comment: NEGATIVE
Comment: NEGATIVE
Comment: NEGATIVE
Comment: NORMAL
Neisseria Gonorrhea: NEGATIVE
Trichomonas: NEGATIVE

## 2020-03-07 MED ORDER — METRONIDAZOLE 500 MG PO TABS: 500.0000 mg | ORAL_TABLET | Freq: Two times a day (BID) | ORAL | 0 refills | Status: DC

## 2020-03-07 NOTE — Telephone Encounter (Signed)
Pt returning call, notified of BV. Pt states she is traveling to [place] and cannot pick up medication in [place].   Per pts request she will call back tomm with pharmacy info in [place] to have it resent.

## 2020-03-08 ENCOUNTER — Telehealth (HOSPITAL_COMMUNITY): Payer: Self-pay | Admitting: Orthopedic Surgery

## 2020-03-08 MED ORDER — METRONIDAZOLE 500 MG PO TABS: 500.0000 mg | ORAL_TABLET | Freq: Two times a day (BID) | ORAL | 0 refills | Status: DC

## 2020-03-08 NOTE — Telephone Encounter (Signed)
Pt calling back with pharmacy info. She would like script sent to [place] [phone].  Script for Flagyl sent there

## 2020-03-21 ENCOUNTER — Other Ambulatory Visit: Payer: Self-pay

## 2020-03-21 ENCOUNTER — Encounter: Payer: Self-pay | Admitting: Nurse Practitioner

## 2020-03-21 ENCOUNTER — Ambulatory Visit: Payer: No Typology Code available for payment source | Admitting: Nurse Practitioner

## 2020-03-21 VITALS — BP 121/76 | HR 69 | Temp 98.3°F | Wt 141.4 lb

## 2020-03-21 DIAGNOSIS — Z23 Encounter for immunization: Secondary | ICD-10-CM | POA: Diagnosis not present

## 2020-03-21 DIAGNOSIS — N952 Postmenopausal atrophic vaginitis: Secondary | ICD-10-CM | POA: Diagnosis not present

## 2020-03-21 MED ORDER — PREMARIN 0.625 MG/GM VA CREA: 1.0000 | TOPICAL_CREAM | VAGINAL | 12 refills | Status: DC

## 2020-03-21 NOTE — Patient Instructions (Signed)
[facility]  Address: [address], [place], [place] [address]  Phone: [phone]   Atrophic Vaginitis Atrophic vaginitis is a condition in which the tissues that line the vagina become dry and thin. This condition occurs in women who have stopped having their period. It is caused by a drop in a female hormone (estrogen). This hormone helps:  To keep the vagina moist.  To make a clear fluid. This clear fluid helps: ? To make the vagina ready for sex. ? To protect the vagina from infection. If the lining of the vagina is dry and thin, it may cause irritation, burning, or itchiness. It may also:  Make sex painful.  Make an exam of your vagina painful.  Cause bleeding.  Make you lose interest in sex.  Cause a burning feeling when you pee (urinate).  Cause a brown or yellow fluid to come from your vagina. Some women do not have symptoms. Follow these instructions at home: Medicines  Take over-the-counter and prescription medicines only as told by your doctor.  Do not use herbs or other medicines unless your doctor says it is okay.  Use medicines for for dryness. These include: ? Oils to make the vagina soft. ? Creams. ? Moisturizers. General instructions  Do not douche.  Do not use products that can make your vagina dry. These include: ? Scented sprays. ? Scented tampons. ? Scented soaps.  Sex can help increase blood flow and soften the tissue in the vagina. If it hurts to have sex: ? Tell your partner. ? Use products to make sex more comfortable. Use these only as told by your doctor. Contact a doctor if you:  Have discharge from the vagina that is different than usual.  Have a bad smell coming from your vagina.  Have new symptoms.  Do not get better.  Get worse. Summary  Atrophic vaginitis is a condition in which the lining of the vagina becomes dry and thin.  This condition affects women who have stopped having  their periods.  Treatment may include using products that help make the vagina soft.  Call a doctor if do not get better with treatment. This information is not intended to replace advice given to you by your health care provider. Make sure you discuss any questions you have with your health care provider. Document Revised: 09/15/2017 Document Reviewed: 09/15/2017 Elsevier Patient Education  2020 [employer].

## 2020-03-21 NOTE — Progress Notes (Signed)
BP 121/76    Pulse 69    Temp 98.3 F (36.8 C) (Oral)    Wt 141 lb 6.4 oz (64.1 kg)    LMP 11/16/2007 (Approximate)    SpO2 97%    BMI 22.62 kg/m    Subjective:    Patient ID: April Raymond, female    DOB: February 22, 1961, 59 y.o.   MRN: [id number]  HPI: April Raymond is a 59 y.o. female  Chief Complaint  Patient presents with   Follow-up   VAGINAL IRRITATION Follow-up for vaginal irritation.  Reports this is improving, but is interested in coming off Estradiol pill and trying cream for atrophy.  Duration: days Discharge description: none noticed  Pruritus: yes Dysuria: none Malodorous: no Urinary frequency: no Fevers: no Abdominal pain: no  Sexual activity: monogamous History of sexually transmitted diseases: no Recent antibiotic use: no Context: stable  Treatments attempted: none   Relevant past medical, surgical, family and social history reviewed and updated as indicated. Interim medical history since our last visit reviewed. Allergies and medications reviewed and updated.  Review of Systems  Constitutional: Negative for activity change, appetite change, diaphoresis, fatigue and fever.  Respiratory: Negative for cough, chest tightness and shortness of breath.   Cardiovascular: Negative for chest pain, palpitations and leg swelling.  Gastrointestinal: Negative.   Genitourinary: Negative for dysuria, frequency, hematuria and urgency.  Neurological: Negative.   Psychiatric/Behavioral: Negative.     Per HPI unless specifically indicated above     Objective:    BP 121/76    Pulse 69    Temp 98.3 F (36.8 C) (Oral)    Wt 141 lb 6.4 oz (64.1 kg)    LMP 11/16/2007 (Approximate)    SpO2 97%    BMI 22.62 kg/m   Wt Readings from Last 3 Encounters:  03/21/20 141 lb 6.4 oz (64.1 kg)  02/29/20 140 lb 12.8 oz (63.9 kg)  11/05/19 146 lb 6.4 oz (66.4 kg)    Physical Exam Vitals and nursing note reviewed.  Constitutional:      General: She is awake. She is not in acute  distress.    Appearance: She is well-developed and well-groomed. She is not ill-appearing.  HENT:     Head: Normocephalic.     Right Ear: Hearing normal.     Left Ear: Hearing normal.  Eyes:     General: Lids are normal.        Right eye: No discharge.        Left eye: No discharge.     Conjunctiva/sclera: Conjunctivae normal.     Pupils: Pupils are equal, round, and reactive to light.  Cardiovascular:     Rate and Rhythm: Normal rate and regular rhythm.     Heart sounds: Normal heart sounds. No murmur heard.  No gallop.   Pulmonary:     Effort: Pulmonary effort is normal. No accessory muscle usage or respiratory distress.     Breath sounds: Normal breath sounds.  Abdominal:     General: Bowel sounds are normal.     Palpations: Abdomen is soft.  Musculoskeletal:     Cervical back: Normal range of motion and neck supple.     Right lower leg: No edema.     Left lower leg: No edema.  Skin:    General: Skin is warm and dry.  Neurological:     Mental Status: She is alert and oriented to person, place, and time.  Psychiatric:        Attention and  Perception: Attention normal.        Mood and Affect: Mood normal.        Speech: Speech normal.        Behavior: Behavior normal. Behavior is cooperative.        Thought Content: Thought content normal.     Results for orders placed or performed during the hospital encounter of 03/06/20  POCT urinalysis dipstick  Result Value Ref Range   Color, UA yellow yellow   Clarity, UA clear clear   Glucose, UA negative negative mg/dL   Bilirubin, UA negative negative   Ketones, POC UA negative negative mg/dL   Spec Grav, UA 1.010 1.010 - 1.025   Blood, UA negative negative   pH, UA 7.0 5.0 - 8.0   Protein Ur, POC negative negative mg/dL   Urobilinogen, UA 0.2 0.2 or 1.0 E.U./dL   Nitrite, UA Negative Negative   Leukocytes, UA Negative Negative  Cervicovaginal ancillary only  Result Value Ref Range   Neisseria Gonorrhea Negative     Chlamydia Negative    Trichomonas Negative    Bacterial Vaginitis (gardnerella) Positive (A)    Candida Vaginitis Negative    Candida Glabrata Negative    Comment      Normal Reference Range Bacterial Vaginosis - Negative   Comment Normal Reference Range Candida Species - Negative    Comment Normal Reference Range Candida Galbrata - Negative    Comment Normal Reference Range Trichomonas - Negative    Comment Normal Reference Ranger Chlamydia - Negative    Comment      Normal Reference Range Neisseria Gonorrhea - Negative      Assessment & Plan:   Problem List Items Addressed This Visit      Genitourinary   Vaginal atrophy    Suspect recent episode of vaginal irritation related to her underlying atrophy.  Will switch to estradiol pill to Premarin cream applied twice a week, script sent.  At this time irritation is improving, however a trial of changing to cream may be beneficial.  Plan to follow-up as scheduled in upcoming months, sooner if return of symptoms.       Other Visit Diagnoses    Need for Tdap vaccination    -  Primary   Relevant Orders   Tdap vaccine greater than or equal to 7yo IM (Completed)       Follow up plan: Return if symptoms worsen or fail to improve.

## 2020-03-21 NOTE — Assessment & Plan Note (Signed)
Suspect recent episode of vaginal irritation related to her underlying atrophy.  Will switch to estradiol pill to Premarin cream applied twice a week, script sent.  At this time irritation is improving, however a trial of changing to cream may be beneficial.  Plan to follow-up as scheduled in upcoming months, sooner if return of symptoms.

## 2020-03-22 ENCOUNTER — Other Ambulatory Visit: Payer: Self-pay | Admitting: Nurse Practitioner

## 2020-03-22 MED ORDER — PREMARIN 0.625 MG/GM VA CREA: 1.0000 | TOPICAL_CREAM | VAGINAL | 12 refills | Status: DC

## 2020-03-28 ENCOUNTER — Other Ambulatory Visit: Payer: Self-pay | Admitting: Nurse Practitioner

## 2020-03-28 DIAGNOSIS — Z1231 Encounter for screening mammogram for malignant neoplasm of breast: Secondary | ICD-10-CM

## 2020-04-05 ENCOUNTER — Other Ambulatory Visit: Payer: Self-pay

## 2020-04-05 ENCOUNTER — Ambulatory Visit
Admission: RE | Admit: 2020-04-05 | Discharge: 2020-04-05 | Disposition: A | Discharge status: Home / Self Care | Payer: BLUE CROSS/BLUE SHIELD | Source: Ambulatory Visit | Attending: Nurse Practitioner | Admitting: Nurse Practitioner

## 2020-04-05 DIAGNOSIS — Z1231 Encounter for screening mammogram for malignant neoplasm of breast: Secondary | ICD-10-CM | POA: Diagnosis not present

## 2020-04-05 IMAGING — MG DIGITAL SCREENING BILAT W/ TOMO W/ CAD
8 series · 8 of 24 positions shown · non-contrast
Comparison: Previous exam(s).

CLINICAL DATA: Screening.

EXAM:
DIGITAL SCREENING BILATERAL MAMMOGRAM WITH TOMO AND CAD

[R CC synth-2D]
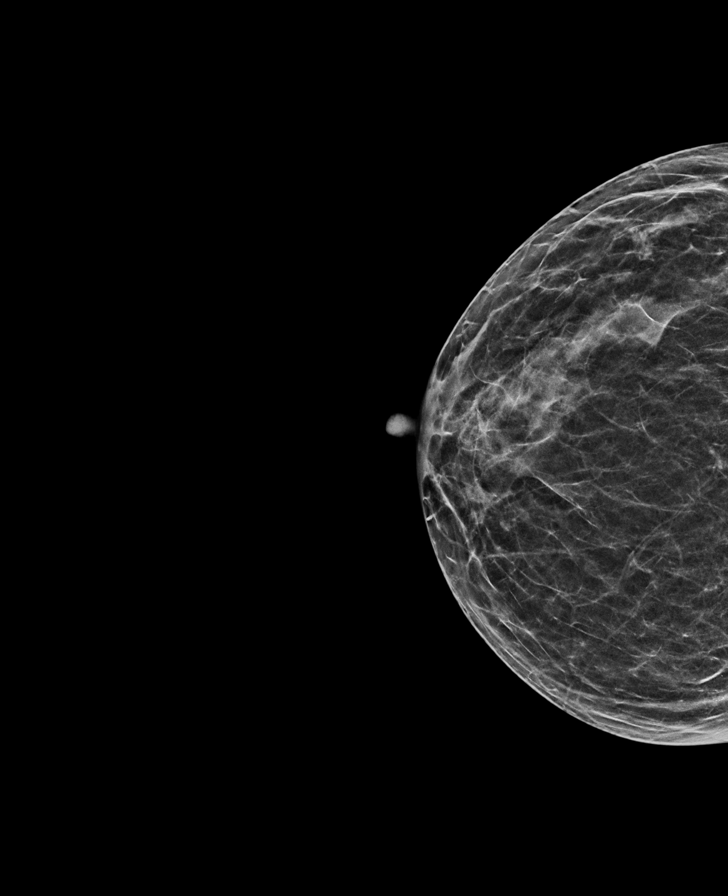

[R MLO synth-2D]
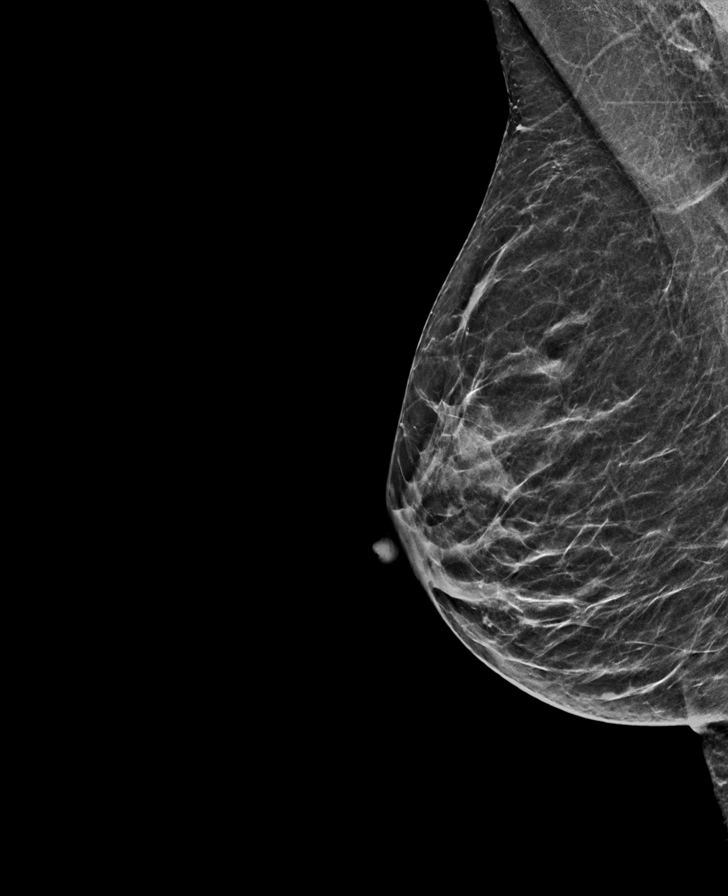

[L CC synth-2D]
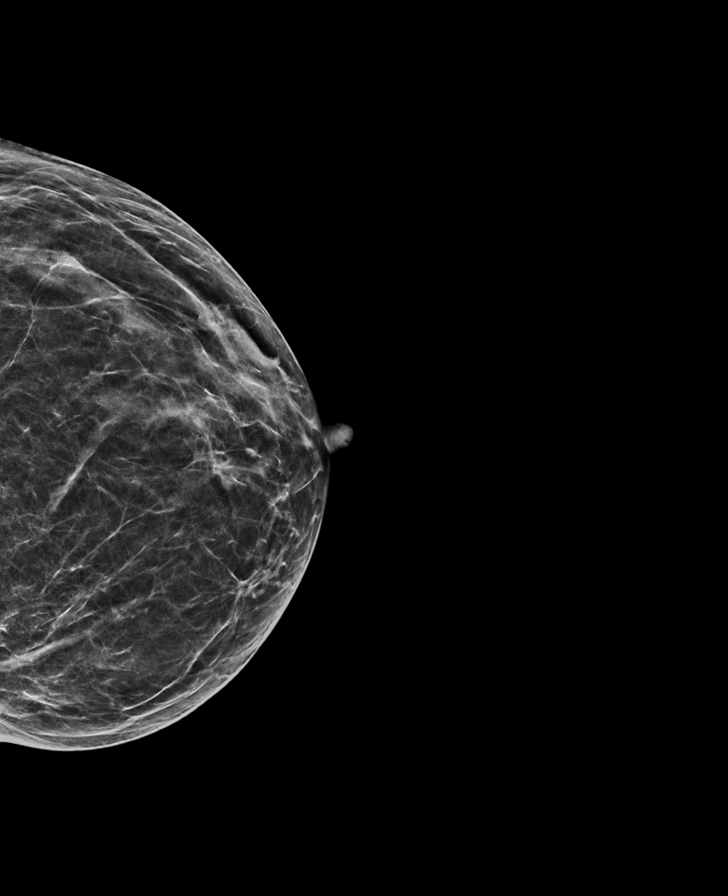

[L MLO synth-2D]
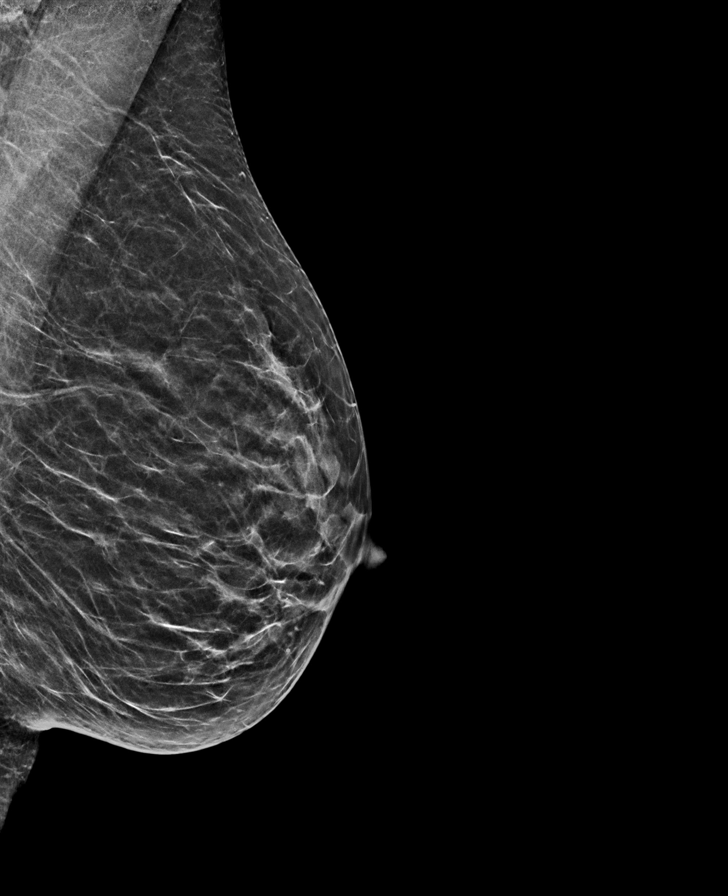

[R CC tomo · tomo slice 27/52.0]
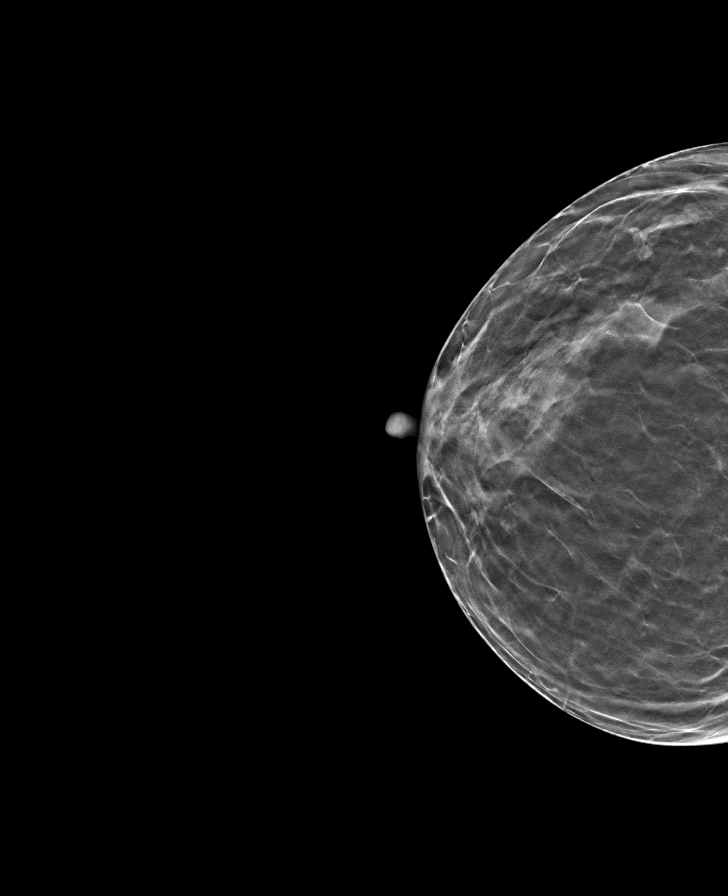

[L CC tomo · tomo slice 29/57.0]
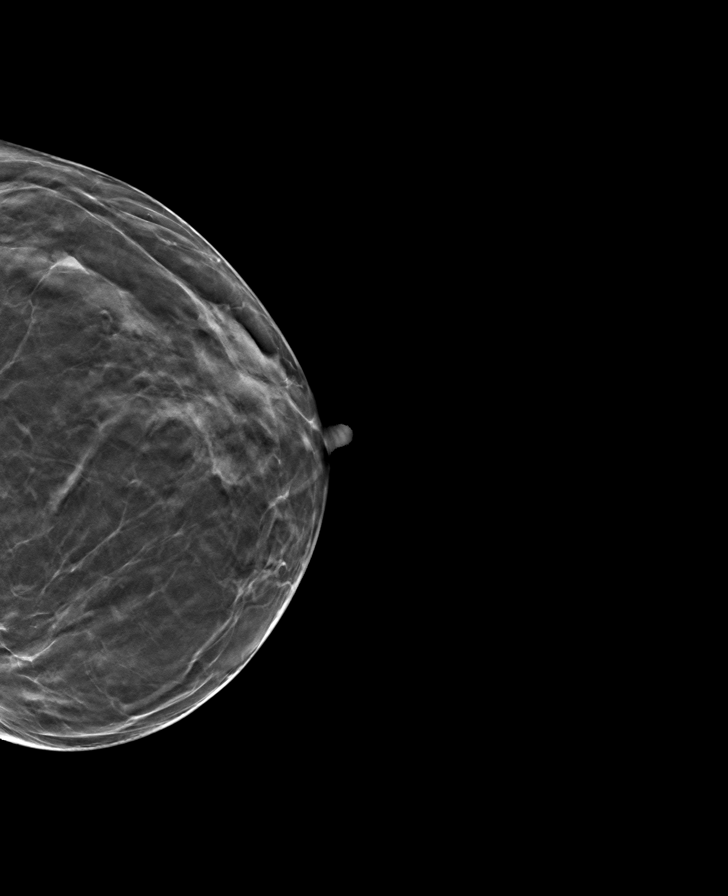

[R MLO tomo · tomo slice 27/52.0]
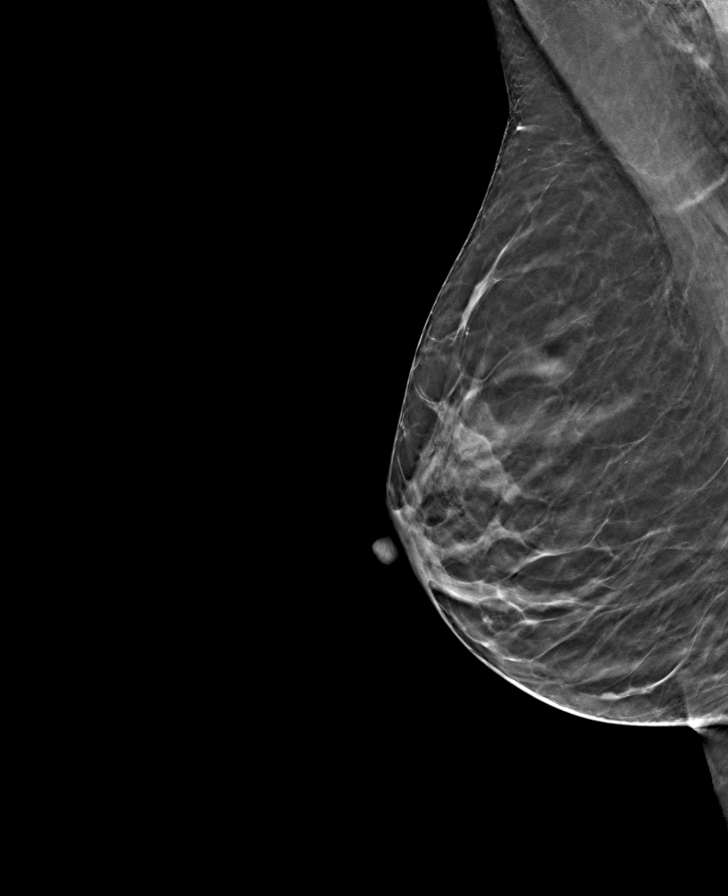

[L MLO tomo · tomo slice 27/53.0]
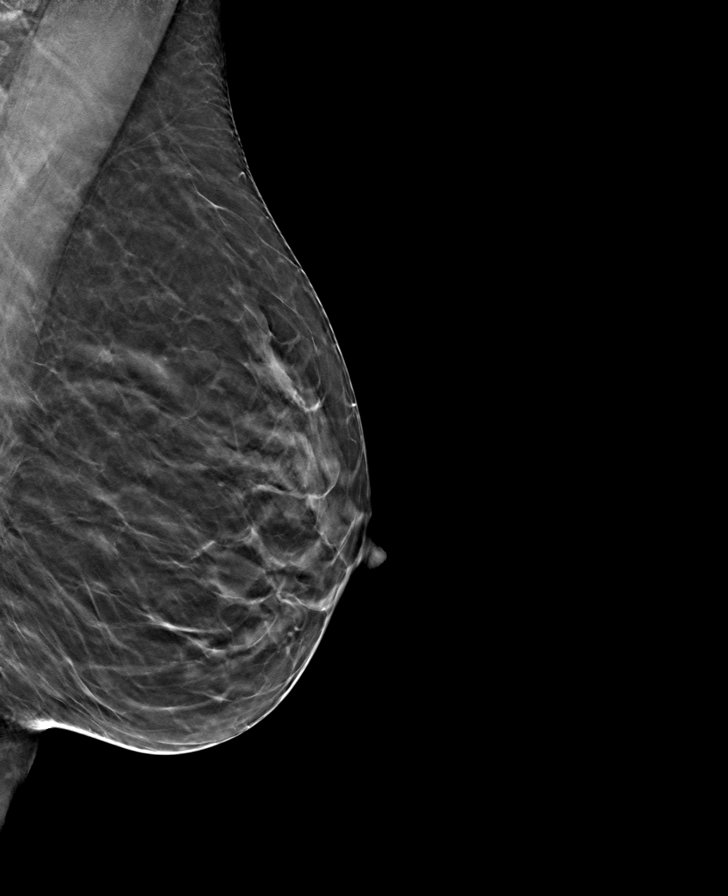

[8 of 24 positions shown; findings below may reference images not displayed]

ACR Breast Density Category b: There are scattered areas of
fibroglandular density.
FINDINGS: There are no findings suspicious for malignancy. Images were
processed with CAD.
IMPRESSION: No mammographic evidence of malignancy. A result letter of this
screening mammogram will be mailed directly to the patient.

RECOMMENDATION:
Screening mammogram in one year. (Code:[TQ])

BI-RADS CATEGORY  1: Negative.

## 2020-04-24 ENCOUNTER — Telehealth: Payer: Self-pay | Admitting: Nurse Practitioner

## 2020-04-24 ENCOUNTER — Other Ambulatory Visit: Payer: Self-pay | Admitting: Nurse Practitioner

## 2020-04-24 MED ORDER — PREMARIN 0.625 MG/GM VA CREA: TOPICAL_CREAM | VAGINAL | 12 refills | Status: DC

## 2020-04-24 NOTE — Telephone Encounter (Signed)
Sent in new script for 0.5 G twice weekly.

## 2020-04-24 NOTE — Telephone Encounter (Signed)
Joe, from pharmacy, called and is requesting to have clarification on the pts premarin. He is needing to know how many grams the pt is supposed to use. Please advise.     [id number] [address], [place]  [place] [address]  Phone: [phone] Fax: [phone]  Hours: Not open 24 hours

## 2020-05-04 ENCOUNTER — Ambulatory Visit: Payer: No Typology Code available for payment source | Admitting: Internal Medicine

## 2020-05-10 ENCOUNTER — Ambulatory Visit: Payer: No Typology Code available for payment source | Admitting: Nurse Practitioner

## 2020-05-17 ENCOUNTER — Ambulatory Visit: Payer: No Typology Code available for payment source | Admitting: Nurse Practitioner

## 2020-05-19 ENCOUNTER — Ambulatory Visit (INDEPENDENT_AMBULATORY_CARE_PROVIDER_SITE_OTHER): Payer: No Typology Code available for payment source | Admitting: Nurse Practitioner

## 2020-05-19 ENCOUNTER — Encounter: Payer: Self-pay | Admitting: Nurse Practitioner

## 2020-05-19 ENCOUNTER — Other Ambulatory Visit: Payer: Self-pay

## 2020-05-19 VITALS — BP 144/77 | HR 60 | Temp 98.2°F | Wt 146.0 lb

## 2020-05-19 DIAGNOSIS — R222 Localized swelling, mass and lump, trunk: Secondary | ICD-10-CM | POA: Diagnosis not present

## 2020-05-19 DIAGNOSIS — I10 Essential (primary) hypertension: Secondary | ICD-10-CM | POA: Diagnosis not present

## 2020-05-19 DIAGNOSIS — Z23 Encounter for immunization: Secondary | ICD-10-CM

## 2020-05-19 DIAGNOSIS — I2510 Atherosclerotic heart disease of native coronary artery without angina pectoris: Secondary | ICD-10-CM

## 2020-05-19 DIAGNOSIS — E782 Mixed hyperlipidemia: Secondary | ICD-10-CM

## 2020-05-19 NOTE — Progress Notes (Signed)
BP (!) 144/77   Pulse 60   Temp 98.2 F (36.8 C) (Oral)   Wt 146 lb (66.2 kg)   LMP 11/16/2007 (Approximate)   SpO2 98%   BMI 23.35 kg/m    Subjective:    Patient ID: April Raymond, female    DOB: 07-05-1961, 59 y.o.   MRN: [id number]  HPI: April Raymond is a 59 y.o. female  Chief Complaint  Patient presents with  . Hypertension  . Hyperlipidemia   HYPERTENSIONWITH CAD Had MI 4 years ago, age 51. Has a family history of MI, mother in her 5's and her father's dad passed in his 67's. Had parathyroid removed in 2018, history of hyperparathyroid. Was followed by cardiology in [place] twice a year due to her significant family history. She continues on Ticagrelor, Amlodipine, and Metoprolol.  Established care with cardiology here, Dr. Saunders Revel, on 10/29/2019 -- returns next week.    Hypertension status:stable Satisfied with current treatment?yes Duration of hypertension:chronic BP monitoring frequency:daily BP range:102/67 yesterday -- 120/70 on average BP medication side effects:no Medication compliance:good compliance Aspirin:ticagrelor, no ASA Recurrent headaches:no Visual changes:no Palpitations:no Dyspnea:no Chest pain:no Lower extremity edema:no Dizzy/lightheaded:no  LIPOMA BACK: Noted during physical this year, she is concerned about it and her family history of cancer.  Her recent MRI of area noted a benign-appearing lipoma in posterior chest wall, which is suspected to be a complicated surgical removal.  She is aware of this, but requests a referral to general surgery for recommendations.  Relevant past medical, surgical, family and social history reviewed and updated as indicated. Interim medical history since our last visit reviewed. Allergies and medications reviewed and updated.  Review of Systems  Constitutional: Negative for activity change, appetite change, diaphoresis, fatigue and fever.  Respiratory: Negative for cough, chest tightness,  shortness of breath and wheezing.   Cardiovascular: Negative for chest pain, palpitations and leg swelling.  Gastrointestinal: Negative.   Endocrine: Negative for cold intolerance and heat intolerance.  Neurological: Negative.   Psychiatric/Behavioral: Negative.     Per HPI unless specifically indicated above     Objective:    BP (!) 144/77   Pulse 60   Temp 98.2 F (36.8 C) (Oral)   Wt 146 lb (66.2 kg)   LMP 11/16/2007 (Approximate)   SpO2 98%   BMI 23.35 kg/m   Wt Readings from Last 3 Encounters:  05/19/20 146 lb (66.2 kg)  03/21/20 141 lb 6.4 oz (64.1 kg)  02/29/20 140 lb 12.8 oz (63.9 kg)    Physical Exam Vitals and nursing note reviewed.  Constitutional:      General: She is awake. She is not in acute distress.    Appearance: She is well-developed and well-groomed. She is not ill-appearing.  HENT:     Head: Normocephalic.     Right Ear: Hearing normal.     Left Ear: Hearing normal.  Eyes:     General: Lids are normal.        Right eye: No discharge.        Left eye: No discharge.     Conjunctiva/sclera: Conjunctivae normal.     Pupils: Pupils are equal, round, and reactive to light.  Cardiovascular:     Rate and Rhythm: Normal rate and regular rhythm.     Heart sounds: Normal heart sounds. No murmur heard.  No gallop.   Pulmonary:     Effort: Pulmonary effort is normal. No accessory muscle usage or respiratory distress.     Breath sounds: Normal  breath sounds.  Abdominal:     General: Bowel sounds are normal.     Palpations: Abdomen is soft.  Musculoskeletal:     Cervical back: Normal range of motion and neck supple.     Right lower leg: No edema.     Left lower leg: No edema.  Skin:    General: Skin is warm and dry.  Neurological:     Mental Status: She is alert and oriented to person, place, and time.  Psychiatric:        Attention and Perception: Attention normal.        Mood and Affect: Mood normal.        Speech: Speech normal.         Behavior: Behavior normal. Behavior is cooperative.        Thought Content: Thought content normal.     Results for orders placed or performed during the hospital encounter of 03/06/20  POCT urinalysis dipstick  Result Value Ref Range   Color, UA yellow yellow   Clarity, UA clear clear   Glucose, UA negative negative mg/dL   Bilirubin, UA negative negative   Ketones, POC UA negative negative mg/dL   Spec Grav, UA 1.010 1.010 - 1.025   Blood, UA negative negative   pH, UA 7.0 5.0 - 8.0   Protein Ur, POC negative negative mg/dL   Urobilinogen, UA 0.2 0.2 or 1.0 E.U./dL   Nitrite, UA Negative Negative   Leukocytes, UA Negative Negative  Cervicovaginal ancillary only  Result Value Ref Range   Neisseria Gonorrhea Negative    Chlamydia Negative    Trichomonas Negative    Bacterial Vaginitis (gardnerella) Positive (A)    Candida Vaginitis Negative    Candida Glabrata Negative    Comment      Normal Reference Range Bacterial Vaginosis - Negative   Comment Normal Reference Range Candida Species - Negative    Comment Normal Reference Range Candida Galbrata - Negative    Comment Normal Reference Range Trichomonas - Negative    Comment Normal Reference Ranger Chlamydia - Negative    Comment      Normal Reference Range Neisseria Gonorrhea - Negative      Assessment & Plan:   Problem List Items Addressed This Visit      Cardiovascular and Mediastinum   CAD (coronary artery disease)    Continue Ticagrelor and Atorvastatin for prevention + collaboration with cardiology.  High risk family and patient history.      Essential hypertension - Primary    Chronic, ongoing with BP slightly elevated on visit today, but at goal on home readings. Will continue current medication regimen and adjust as needed.  Continue collaboration with cardiology due to her significant family and personal cardiac history.  Labs today. Return in 6 months.      Relevant Orders   Basic metabolic panel      Other   Hyperlipidemia    Chronic, ongoing with history of MI.  Continue statin and adjust dose as needed.  If elevation will increase Atorvastatin, reviewed recent cardiology note which mentioned elevation to high intensity statin may offer additional benefit to patient.        Relevant Orders   Lipid Panel w/o Chol/HDL Ratio   Mass of skin of back    Referral to general surgery for further recommendations.      Relevant Orders   Ambulatory referral to General Surgery    Other Visit Diagnoses    Flu vaccine need  Relevant Orders   Flu Vaccine QUAD 36+ mos IM (Completed)       Follow up plan: Return in about 6 months (around 11/16/2020) for Annual physical.

## 2020-05-19 NOTE — Assessment & Plan Note (Signed)
Chronic, ongoing with history of MI.  Continue statin and adjust dose as needed.  If elevation will increase Atorvastatin, reviewed recent cardiology note which mentioned elevation to high intensity statin may offer additional benefit to patient.

## 2020-05-19 NOTE — Assessment & Plan Note (Signed)
Chronic, ongoing with BP slightly elevated on visit today, but at goal on home readings. Will continue current medication regimen and adjust as needed.  Continue collaboration with cardiology due to her significant family and personal cardiac history.  Labs today. Return in 6 months.

## 2020-05-19 NOTE — Patient Instructions (Signed)
DASH Eating Plan DASH stands for "Dietary Approaches to Stop Hypertension." The DASH eating plan is a healthy eating plan that has been shown to reduce high blood pressure (hypertension). It may also reduce your risk for type 2 diabetes, heart disease, and stroke. The DASH eating plan may also help with weight loss. What are tips for following this plan?  General guidelines  Avoid eating more than 2,300 mg (milligrams) of salt (sodium) a day. If you have hypertension, you may need to reduce your sodium intake to 1,500 mg a day.  Limit alcohol intake to no more than 1 drink a day for nonpregnant women and 2 drinks a day for men. One drink equals 12 oz of beer, 5 oz of wine, or 1 oz of hard liquor.  Work with your health care provider to maintain a healthy body weight or to lose weight. Ask what an ideal weight is for you.  Get at least 30 minutes of exercise that causes your heart to beat faster (aerobic exercise) most days of the week. Activities may include walking, swimming, or biking.  Work with your health care provider or diet and nutrition specialist (dietitian) to adjust your eating plan to your individual calorie needs. Reading food labels   Check food labels for the amount of sodium per serving. Choose foods with less than 5 percent of the Daily Value of sodium. Generally, foods with less than 300 mg of sodium per serving fit into this eating plan.  To find whole grains, look for the word "whole" as the first word in the ingredient list. Shopping  Buy products labeled as "low-sodium" or "no salt added."  Buy fresh foods. Avoid canned foods and premade or frozen meals. Cooking  Avoid adding salt when cooking. Use salt-free seasonings or herbs instead of table salt or sea salt. Check with your health care provider or pharmacist before using salt substitutes.  Do not fry foods. Cook foods using healthy methods such as baking, boiling, grilling, and broiling instead.  Cook with  heart-healthy oils, such as olive, canola, soybean, or sunflower oil. Meal planning  Eat a balanced diet that includes: ? 5 or more servings of fruits and vegetables each day. At each meal, try to fill half of your plate with fruits and vegetables. ? Up to 6-8 servings of whole grains each day. ? Less than 6 oz of lean meat, poultry, or fish each day. A 3-oz serving of meat is about the same size as a deck of cards. One egg equals 1 oz. ? 2 servings of low-fat dairy each day. ? A serving of nuts, seeds, or beans 5 times each week. ? Heart-healthy fats. Healthy fats called Omega-3 fatty acids are found in foods such as flaxseeds and coldwater fish, like sardines, salmon, and mackerel.  Limit how much you eat of the following: ? Canned or prepackaged foods. ? Food that is high in trans fat, such as fried foods. ? Food that is high in saturated fat, such as fatty meat. ? Sweets, desserts, sugary drinks, and other foods with added sugar. ? Full-fat dairy products.  Do not salt foods before eating.  Try to eat at least 2 vegetarian meals each week.  Eat more home-cooked food and less restaurant, buffet, and fast food.  When eating at a restaurant, ask that your food be prepared with less salt or no salt, if possible. What foods are recommended? The items listed may not be a complete list. Talk with your dietitian about   what dietary choices are best for you. Grains Whole-grain or whole-wheat bread. Whole-grain or whole-wheat pasta. Brown rice. Oatmeal. Quinoa. Bulgur. Whole-grain and low-sodium cereals. Pita bread. Low-fat, low-sodium crackers. Whole-wheat flour tortillas. Vegetables Fresh or frozen vegetables (raw, steamed, roasted, or grilled). Low-sodium or reduced-sodium tomato and vegetable juice. Low-sodium or reduced-sodium tomato sauce and tomato paste. Low-sodium or reduced-sodium canned vegetables. Fruits All fresh, dried, or frozen fruit. Canned fruit in natural juice (without  added sugar). Meat and other protein foods Skinless chicken or turkey. Ground chicken or turkey. Pork with fat trimmed off. Fish and seafood. Egg whites. Dried beans, peas, or lentils. Unsalted nuts, nut butters, and seeds. Unsalted canned beans. Lean cuts of beef with fat trimmed off. Low-sodium, lean deli meat. Dairy Low-fat (1%) or fat-free (skim) milk. Fat-free, low-fat, or reduced-fat cheeses. Nonfat, low-sodium ricotta or cottage cheese. Low-fat or nonfat yogurt. Low-fat, low-sodium cheese. Fats and oils Soft margarine without trans fats. Vegetable oil. Low-fat, reduced-fat, or light mayonnaise and salad dressings (reduced-sodium). Canola, safflower, olive, soybean, and sunflower oils. Avocado. Seasoning and other foods Herbs. Spices. Seasoning mixes without salt. Unsalted popcorn and pretzels. Fat-free sweets. What foods are not recommended? The items listed may not be a complete list. Talk with your dietitian about what dietary choices are best for you. Grains Baked goods made with fat, such as croissants, muffins, or some breads. Dry pasta or rice meal packs. Vegetables Creamed or fried vegetables. Vegetables in a cheese sauce. Regular canned vegetables (not low-sodium or reduced-sodium). Regular canned tomato sauce and paste (not low-sodium or reduced-sodium). Regular tomato and vegetable juice (not low-sodium or reduced-sodium). Pickles. Olives. Fruits Canned fruit in a light or heavy syrup. Fried fruit. Fruit in cream or butter sauce. Meat and other protein foods Fatty cuts of meat. Ribs. Fried meat. Bacon. Sausage. Bologna and other processed lunch meats. Salami. Fatback. Hotdogs. Bratwurst. Salted nuts and seeds. Canned beans with added salt. Canned or smoked fish. Whole eggs or egg yolks. Chicken or turkey with skin. Dairy Whole or 2% milk, cream, and half-and-half. Whole or full-fat cream cheese. Whole-fat or sweetened yogurt. Full-fat cheese. Nondairy creamers. Whipped toppings.  Processed cheese and cheese spreads. Fats and oils Butter. Stick margarine. Lard. Shortening. Ghee. Bacon fat. Tropical oils, such as coconut, palm kernel, or palm oil. Seasoning and other foods Salted popcorn and pretzels. Onion salt, garlic salt, seasoned salt, table salt, and sea salt. Worcestershire sauce. Tartar sauce. Barbecue sauce. Teriyaki sauce. Soy sauce, including reduced-sodium. Steak sauce. Canned and packaged gravies. Fish sauce. Oyster sauce. Cocktail sauce. Horseradish that you find on the shelf. Ketchup. Mustard. Meat flavorings and tenderizers. Bouillon cubes. Hot sauce and Tabasco sauce. Premade or packaged marinades. Premade or packaged taco seasonings. Relishes. Regular salad dressings. Where to find more information:  National Heart, Lung, and Blood Institute: www.nhlbi.nih.gov  American Heart Association: www.heart.org Summary  The DASH eating plan is a healthy eating plan that has been shown to reduce high blood pressure (hypertension). It may also reduce your risk for type 2 diabetes, heart disease, and stroke.  With the DASH eating plan, you should limit salt (sodium) intake to 2,300 mg a day. If you have hypertension, you may need to reduce your sodium intake to 1,500 mg a day.  When on the DASH eating plan, aim to eat more fresh fruits and vegetables, whole grains, lean proteins, low-fat dairy, and heart-healthy fats.  Work with your health care provider or diet and nutrition specialist (dietitian) to adjust your eating plan to your   individual calorie needs. This information is not intended to replace advice given to you by your health care provider. Make sure you discuss any questions you have with your health care provider. Document Revised: 08/15/2017 Document Reviewed: 08/26/2016 Elsevier Patient Education  2020 Elsevier Inc.  

## 2020-05-19 NOTE — Assessment & Plan Note (Signed)
Referral to general surgery for further recommendations.

## 2020-05-19 NOTE — Assessment & Plan Note (Signed)
Continue Ticagrelor and Atorvastatin for prevention + collaboration with cardiology.  High risk family and patient history.

## 2020-05-20 LAB — BASIC METABOLIC PANEL
BUN/Creatinine Ratio: 16 (ref 12–28)
BUN: 17 mg/dL (ref 8–27)
CO2: 24 mmol/L (ref 20–29)
Calcium: 9.7 mg/dL (ref 8.7–10.3)
Chloride: 104 mmol/L (ref 96–106)
Creatinine, Ser: 1.05 mg/dL — ABNORMAL HIGH (ref 0.57–1.00)
GFR calc Af Amer: 67 mL/min/{1.73_m2} (ref >59)
GFR calc non Af Amer: 58 mL/min/{1.73_m2} — ABNORMAL LOW (ref >59)
Glucose: 87 mg/dL (ref 65–99)
Potassium: 4.5 mmol/L (ref 3.5–5.2)
Sodium: 140 mmol/L (ref 134–144)

## 2020-05-20 LAB — LIPID PANEL W/O CHOL/HDL RATIO
Cholesterol, Total: 141 mg/dL (ref 100–199)
HDL: 67 mg/dL (ref >39)
LDL Chol Calc (NIH): 59 mg/dL (ref 0–99)
Triglycerides: 77 mg/dL (ref 0–149)
VLDL Cholesterol Cal: 15 mg/dL (ref 5–40)

## 2020-05-22 NOTE — Progress Notes (Signed)
Contacted via [facility] afternoon Deena, your labs have returned.  Cholesterol levels look fantastic and are at goal.  Electrolytes are normal.  Kidney function, creatinine and GFR, show some mild decline this check.  I would like you to ensure you are drinking lots of water daily and continue current medications, if kidney function continues to show same on next check we may consider changing blood pressure medications and putting you on Lisinopril or Losartan which are kidney protective, then stopping Amlodipine.  Any questions? Keep being awesome!!  Thank you for allowing me to participate in your care. Kindest regards, Anokhi Shannon

## 2020-05-24 ENCOUNTER — Encounter: Payer: Self-pay | Admitting: Internal Medicine

## 2020-05-24 ENCOUNTER — Ambulatory Visit: Payer: No Typology Code available for payment source | Admitting: Internal Medicine

## 2020-05-24 ENCOUNTER — Other Ambulatory Visit: Payer: Self-pay

## 2020-05-24 VITALS — BP 148/80 | HR 63 | Ht 66.0 in | Wt 144.4 lb

## 2020-05-24 DIAGNOSIS — I200 Unstable angina: Secondary | ICD-10-CM

## 2020-05-24 DIAGNOSIS — E785 Hyperlipidemia, unspecified: Secondary | ICD-10-CM | POA: Diagnosis not present

## 2020-05-24 DIAGNOSIS — I2510 Atherosclerotic heart disease of native coronary artery without angina pectoris: Secondary | ICD-10-CM | POA: Diagnosis not present

## 2020-05-24 DIAGNOSIS — I10 Essential (primary) hypertension: Secondary | ICD-10-CM | POA: Diagnosis not present

## 2020-05-24 NOTE — Patient Instructions (Addendum)
Please bring your blood pressure cuff with you when you come into the office for stress test.   Medication Instructions:  Your physician recommends that you continue on your current medications as directed. Please refer to the Current Medication list given to you today.  *If you need a refill on your cardiac medications before your next appointment, please call your pharmacy*  Lab Work: COVID PRE- TEST: You will need a COVID TEST prior to the procedure:  LOCATION: [facility].  DATE/TIME:  _________________ Sometime between 8 am and 1 pm.  If you have labs (blood work) drawn today and your tests are completely normal, you will receive your results only by: Marland Kitchen MyChart Message (if you have MyChart) OR . A paper copy in the mail If you have any lab test that is abnormal or we need to change your treatment, we will call you to review the results.  Testing/Procedures: Your physician has requested that you have an exercise tolerance test. For further information please visit [email or web address]. Please also follow instruction sheet, as given.   DO NOT drink or eat foods with caffeine for 24 hours before the test. (Chocolate, coffee, tea, decaf coffee/tea, or energy drinks)  DO NOT smoke for 4 hours before your test.  If you use an inhaler, bring it with you to the test.  Wear comfortable shoes and clothing. Women do not wear dresses.  DO not take Metoprolol the morning of your stress test.    Follow-Up: At [facility], you and your health needs are our priority.  As part of our continuing mission to provide you with exceptional heart care, we have created designated Provider Care Teams.  These Care Teams include your primary Cardiologist (physician) and Advanced Practice Providers (APPs -  Physician Assistants and Nurse Practitioners) who all work together to provide you with the care you need, when you need it.  We recommend signing up for the  patient portal called "MyChart".  Sign up information is provided on this After Visit Summary.  MyChart is used to connect with patients for Virtual Visits (Telemedicine).  Patients are able to view lab/test results, encounter notes, upcoming appointments, etc.  Non-urgent messages can be sent to your provider as well.   To learn more about what you can do with MyChart, go to [email or web address].    Your next appointment:   1 month(s)  The format for your next appointment:   In Person  Provider:    You may see DR Harrell Gave END or one of the following Advanced Practice Providers on your designated Care Team:    Murray Hodgkins, NP  Christell Faith, PA-C  Marrianne Mood, PA-C    Exercise Stress Test An exercise stress test is a test to check how your heart works during exercise. You will need to walk on a treadmill or ride an exercise bike for this test. An electrocardiogram (ECG) will record your heartbeat when you are at rest and when you are exercising. You may have an ultrasound or nuclear test after the exercise test. The test is done to check for coronary artery disease (CAD). It is also done to:  See how well you can exercise.  Watch for high blood pressure during exercise.  Test how well you can exercise after treatment.  Check the blood flow to your arms and legs. If your test result is not normal, more testing may be needed. What happens before the procedure?  Follow instructions from your doctor  about what you cannot eat or drink. ? Do not have any drinks or foods that have caffeine in them for 24 hours before the test, or as told by your doctor. This includes coffee, tea (even decaf tea), sodas, chocolate, and cocoa.  Ask your doctor about changing or stopping your normal medicines. This is important if you: ? Take diabetes medicines. ? Take beta-blocker medicines. ? Wear a nitroglycerin patch.  If you use an inhaler, bring it with you to the test.  Do not  put lotions, powders, creams, or oils on your chest before the test.  Wear comfortable shoes and clothing.  Do not use any products that have nicotine or tobacco in them, such as cigarettes and e-cigarettes. Stop using them at least 4 hours before the test. If you need help quitting, ask your doctor. What happens during the procedure?   Patches (electrodes) will be put on your chest.  Wires will be connected to the patches. The wires will send signals to a machine to record your heartbeat.  Your heart rate will be watched while you are resting and while you are exercising. Your blood pressure will also be watched during the test.  You will walk on a treadmill or use a stationary bike. If you cannot use these, you may be asked to turn a crank with your hands.  The activity will get harder and will raise your heart rate.  You may be asked to breathe into a tube a few times during the test. This measures the gases that you breathe out.  You will be asked how you are feeling throughout the test.  You will exercise until your heart reaches a target heart rate. You will stop early if: ? You feel dizzy. ? You have chest pain. ? You are out of breath. ? Your blood pressure is too high or too low. ? You have an irregular heartbeat. ? You have pain or aching in your arms or legs. The procedure may vary among doctors and hospitals. What happens after the procedure?  Your blood pressure, heart rate, breathing rate, and blood oxygen level will be watched after the test.  You may return to your normal diet and activities as told by your doctor.  It is up to you to get the results of your test. Ask your doctor, or the department that is doing the test, when your results will be ready. Summary  An exercise stress test is a test to check how your heart works during exercise.  This test is done to check for coronary artery disease.  Your heart rate will be watched while you are resting and  while you are exercising.  Follow instructions from your doctor about what you cannot eat or drink before the test. This information is not intended to replace advice given to you by your health care provider. Make sure you discuss any questions you have with your health care provider. Document Revised: 12/15/2018 Document Reviewed: 12/03/2016 Elsevier Patient Education  [employer].

## 2020-05-24 NOTE — Progress Notes (Signed)
Follow-up Outpatient Visit Date: 05/24/2020  Primary Care Provider: Venita Lick, NP [place] [address]  Chief Complaint: Chest pain  HPI:  April Raymond is a 59 y.o. 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  I met her in February, at which time she wished to establish cardiology care after having recently moved from [place].  She was doing well without any cardiac complaints.  No medication changes or additional testing were pursued at that time.  Today, April Raymond reports that she has been doing fairly well.  She has experienced occasional tightness in her chest when she is active in the heat.  The discomfort resolves as soon as she rests for a minute or two.  She denies shortness of breath, palpitations, lightheadedness, and edema.  She has been checking her BP at home the last few days and notes it to be 35-009 mmHg systolic.  --------------------------------------------------------------------------------------------------  Cardiovascular History & Procedures: Cardiovascular Problems:  Coronary artery disease status post MI  Risk Factors:  Known coronary artery disease, hypertension, and hyperlipidemia  Cath/PCI:  LHC/PCI (10/15/2015, [place]): LMCA normal.  LAD with discrete 60% mid vessel stenosis.  LCx normal.  RCA with 100% occlusion.  LVEF 55% with inferior hypokinesis.  Successful PCI to RCA with overlapping Synergy 2.5 x 12, 2.25 x 12, and 2.25 x 32 mm drug-eluting stents.  Patient also required temporary transvenous pacer and intra-aortic balloon pump.  CV Surgery:  None  EP Procedures and Devices:  None  Non-Invasive Evaluation(s):  Exercise MPI (09/19/2017): Mildly submaximal  stress test with no ischemia or scar.  LVEF 75%.  Recent CV Pertinent Labs: Lab Results  Component Value Date   CHOL 141 05/19/2020   HDL 67 05/19/2020   LDLCALC 59 05/19/2020   TRIG 77 05/19/2020   K 4.5 05/19/2020   MG 2.1 11/05/2019   BUN 17 05/19/2020   CREATININE 1.05 (H) 05/19/2020    Past medical and surgical history were reviewed and updated in EPIC.  Current Meds  Medication Sig  . amLODipine (NORVASC) 2.5 MG tablet Take 1 tablet (2.5 mg total) by mouth daily.  Marland Kitchen atorvastatin (LIPITOR) 20 MG tablet Take 1 tablet (20 mg total) by mouth daily at 6 PM.  . conjugated estrogens (PREMARIN) vaginal cream Place 0.5 grams twice weekly intravaginally.  Marland Kitchen levothyroxine (SYNTHROID) 75 MCG tablet 1 tablet daily 6 days per week  . metoprolol tartrate (LOPRESSOR) 25 MG tablet Take 1 tablet (25 mg total) by mouth daily.  . nitroGLYCERIN (NITROSTAT) 0.4 MG SL tablet Place 1 tablet (0.4 mg total) under the tongue every 5 (five) minutes as needed for chest pain.  . pantoprazole (PROTONIX) 40 MG tablet Take 1 tablet (40 mg total) by mouth daily.  . ticagrelor (BRILINTA) 60 MG TABS tablet Take 1 tablet (60 mg total) by mouth 2 (two) times daily.    Allergies: Humira [adalimumab]  Social History   Tobacco Use  . Smoking status: Former Smoker    Packs/day: 1.00    Years: 40.00    Pack years: 40.00    Types: Cigarettes    Quit date: 09/18/2015    Years since quitting: 4.6  . Smokeless tobacco: Never Used  Vaping Use  . Vaping Use: Never used  Substance Use Topics  .  Alcohol use: Yes    Comment: on occasion  . Drug use: Never    Family History  Problem Relation Age of Onset  . Heart attack Mother 33  . COPD Mother   . Stroke Mother   . Diverticulitis Mother   . Cancer Mother        breast  . Hypertension Mother   . Breast cancer Mother 1  . Hypertension Father   . Hypertension Brother   . Stroke Maternal Grandfather   . Alzheimer's disease Paternal Grandmother   . Heart  attack Paternal Grandfather   . Breast cancer Cousin        mat cousin    Review of Systems: A 12-system review of systems was performed and was negative except as noted in the HPI.  --------------------------------------------------------------------------------------------------  Physical Exam: BP (!) 148/80 (BP Location: Left Arm, Patient Position: Sitting, Cuff Size: Normal)   Pulse 63   Ht '5\' 6"'  (1.676 m)   Wt 144 lb 6 oz (65.5 kg)   LMP 11/16/2007 (Approximate)   SpO2 97%   BMI 23.30 kg/m   144/80  General:  NAD. HEENT: No conjunctival pallor or scleral icterus. Facemask in place. Neck: Supple without lymphadenopathy, thyromegaly, JVD, or HJR. Lungs: Normal work of breathing. Clear to auscultation bilaterally without wheezes or crackles. Heart: Regular rate and rhythm without murmurs, rubs, or gallops. Abd: Bowel sounds present. Soft, NT/ND. Ext: No lower extremity edema.  EKG:  NSR with septal infarct versus lead placement.  No significant change since prior tracing on 10/29/2019.  Lab Results  Component Value Date   WBC 6.1 11/05/2019   HGB 12.0 11/05/2019   HCT 36.2 11/05/2019   MCV 96 11/05/2019   PLT 359 11/05/2019    Lab Results  Component Value Date   NA 140 05/19/2020   K 4.5 05/19/2020   CL 104 05/19/2020   CO2 24 05/19/2020   BUN 17 05/19/2020   CREATININE 1.05 (H) 05/19/2020   GLUCOSE 87 05/19/2020   ALT 24 11/05/2019    Lab Results  Component Value Date   CHOL 141 05/19/2020   HDL 67 05/19/2020   LDLCALC 59 05/19/2020   TRIG 77 05/19/2020    --------------------------------------------------------------------------------------------------  ASSESSMENT AND PLAN: Coronary artery disease with accelerating angina: April Raymond reports occasional exertional chest pain when in the heat, new compared with last year.  Exam is normal today.  EKG shows possible septal Q-waves (though may be related to lead placement); overall no significant change  from prior tracing in February.  As she is s/p extended stenting of the RCA in 2018 and was also found to have moderate LAD disease at that time, I have recommended that we proceed with an exercise tolerance test.  We will continue current antianginal regiment of amlodipine and metoprolol.  We will also continue indefinite ticagrelor.  Hypertension: BP mildly elevated today but on the lower Meena Barrantes of normal at home.  I have asked April Raymond to bring her home BP with her to the exercise tolerance test so that we can assess the accuracy of her cuff.  We will defer medication changes today.  Hyperlipidemia: LDL at goal.  We will continue atorvastatin 20 mg daily.  Follow-up: Return to clinic in 1 month.  Nelva Bush, MD 05/24/2020 3:06 PM

## 2020-05-26 ENCOUNTER — Encounter: Payer: Self-pay | Admitting: Internal Medicine

## 2020-05-29 ENCOUNTER — Other Ambulatory Visit
Admission: RE | Admit: 2020-05-29 | Discharge: 2020-05-29 | Disposition: A | Discharge status: Home / Self Care | Payer: BLUE CROSS/BLUE SHIELD | Source: Ambulatory Visit | Attending: Internal Medicine | Admitting: Internal Medicine

## 2020-05-29 ENCOUNTER — Other Ambulatory Visit: Payer: Self-pay

## 2020-05-29 DIAGNOSIS — I214 Non-ST elevation (NSTEMI) myocardial infarction: Secondary | ICD-10-CM | POA: Diagnosis not present

## 2020-05-29 DIAGNOSIS — Z20822 Contact with and (suspected) exposure to covid-19: Secondary | ICD-10-CM | POA: Insufficient documentation

## 2020-05-29 DIAGNOSIS — I5181 Takotsubo syndrome: Secondary | ICD-10-CM | POA: Diagnosis not present

## 2020-05-29 DIAGNOSIS — Z01812 Encounter for preprocedural laboratory examination: Secondary | ICD-10-CM | POA: Insufficient documentation

## 2020-05-30 ENCOUNTER — Encounter
Admission: EM | Disposition: A | Discharge status: Home / Self Care | Payer: Self-pay | Source: Home / Self Care | Attending: Obstetrics and Gynecology

## 2020-05-30 ENCOUNTER — Other Ambulatory Visit: Payer: Self-pay

## 2020-05-30 ENCOUNTER — Ambulatory Visit: Payer: No Typology Code available for payment source

## 2020-05-30 ENCOUNTER — Inpatient Hospital Stay
Admission: EM | Admit: 2020-05-30 | Discharge: 2020-06-01 | DRG: 287 | Disposition: A | Discharge status: Home / Self Care | Payer: BLUE CROSS/BLUE SHIELD | Attending: Obstetrics and Gynecology | Admitting: Obstetrics and Gynecology

## 2020-05-30 ENCOUNTER — Encounter: Payer: Self-pay | Admitting: Radiology

## 2020-05-30 ENCOUNTER — Emergency Department: Payer: BLUE CROSS/BLUE SHIELD

## 2020-05-30 DIAGNOSIS — I5181 Takotsubo syndrome: Principal | ICD-10-CM

## 2020-05-30 DIAGNOSIS — I25118 Atherosclerotic heart disease of native coronary artery with other forms of angina pectoris: Secondary | ICD-10-CM | POA: Diagnosis present

## 2020-05-30 DIAGNOSIS — I10 Essential (primary) hypertension: Secondary | ICD-10-CM | POA: Diagnosis present

## 2020-05-30 DIAGNOSIS — E785 Hyperlipidemia, unspecified: Secondary | ICD-10-CM | POA: Diagnosis present

## 2020-05-30 DIAGNOSIS — I2510 Atherosclerotic heart disease of native coronary artery without angina pectoris: Secondary | ICD-10-CM | POA: Diagnosis present

## 2020-05-30 DIAGNOSIS — Z20822 Contact with and (suspected) exposure to covid-19: Secondary | ICD-10-CM | POA: Diagnosis present

## 2020-05-30 DIAGNOSIS — Z87891 Personal history of nicotine dependence: Secondary | ICD-10-CM

## 2020-05-30 DIAGNOSIS — Z888 Allergy status to other drugs, medicaments and biological substances status: Secondary | ICD-10-CM

## 2020-05-30 DIAGNOSIS — E063 Autoimmune thyroiditis: Secondary | ICD-10-CM | POA: Diagnosis present

## 2020-05-30 DIAGNOSIS — Z79890 Hormone replacement therapy (postmenopausal): Secondary | ICD-10-CM

## 2020-05-30 DIAGNOSIS — Z955 Presence of coronary angioplasty implant and graft: Secondary | ICD-10-CM

## 2020-05-30 DIAGNOSIS — I428 Other cardiomyopathies: Secondary | ICD-10-CM

## 2020-05-30 DIAGNOSIS — Z79899 Other long term (current) drug therapy: Secondary | ICD-10-CM

## 2020-05-30 DIAGNOSIS — I252 Old myocardial infarction: Secondary | ICD-10-CM

## 2020-05-30 DIAGNOSIS — Z8249 Family history of ischemic heart disease and other diseases of the circulatory system: Secondary | ICD-10-CM

## 2020-05-30 DIAGNOSIS — I200 Unstable angina: Secondary | ICD-10-CM | POA: Diagnosis not present

## 2020-05-30 DIAGNOSIS — E213 Hyperparathyroidism, unspecified: Secondary | ICD-10-CM | POA: Diagnosis present

## 2020-05-30 DIAGNOSIS — I214 Non-ST elevation (NSTEMI) myocardial infarction: Secondary | ICD-10-CM | POA: Diagnosis present

## 2020-05-30 LAB — EXERCISE TOLERANCE TEST
Estimated workload: 9.3 METS
Exercise duration (min): 7 min
Exercise duration (sec): 31 s
MPHR: 160 {beats}/min
Peak HR: 155 {beats}/min
Percent HR: 96 %
RPE: 13
Rest HR: 93 {beats}/min

## 2020-05-30 LAB — SARS CORONAVIRUS 2 (TAT 6-24 HRS): SARS Coronavirus 2: NEGATIVE

## 2020-05-30 SURGERY — CORONARY/GRAFT ACUTE MI REVASCULARIZATION
Anesthesia: Moderate Sedation

## 2020-05-30 MED ORDER — ONDANSETRON HCL 4 MG/2ML IJ SOLN
INTRAMUSCULAR | Status: AC
  Administered 2020-05-30: 4 mg via INTRAVENOUS
  Filled 2020-05-30: qty 2

## 2020-05-30 MED ORDER — MORPHINE SULFATE (PF) 4 MG/ML IV SOLN: 4.0000 mg | Freq: Once | INTRAVENOUS | Status: AC

## 2020-05-30 MED ORDER — IOHEXOL 350 MG/ML SOLN
100.0000 mL | Freq: Once | INTRAVENOUS | Status: AC | PRN
  Administered 2020-05-31: 100 mL via INTRAVENOUS

## 2020-05-30 MED ORDER — MORPHINE SULFATE (PF) 4 MG/ML IV SOLN
INTRAVENOUS | Status: AC
  Administered 2020-05-30: 4 mg via INTRAVENOUS
  Filled 2020-05-30: qty 1

## 2020-05-30 MED ORDER — ONDANSETRON HCL 4 MG/2ML IJ SOLN: 4.0000 mg | Freq: Once | INTRAMUSCULAR | Status: AC

## 2020-05-30 NOTE — ED Notes (Signed)
Code stemi cancelled at this time by Dr. Saunders Revel.

## 2020-05-30 NOTE — ED Notes (Signed)
Dr. End at bedside. 

## 2020-05-30 NOTE — ED Triage Notes (Signed)
Pt arrival via [facility] from home due to chest pain/back pain. Pt went to cardiologist today to get a stress test which ended abnormally. Pt then left and started having chest pain that radiated to her back. Pt states that she no longer has chest pain, but that her pain in her back is still there and has been there consistently.   EMS called a code stemi in route. Pt has hx (4.5 years ago) of blockage in her heart and received 3 stents, one of which she was told would have to have more work in the future. EMS gave patient 324 of asa and 3 nitros.   VS:  6/10 pain scale in back HR 30's-80's Bp sys 150-180's Pt possibly dx with afib today   Dr. Alfred Levins at bedside.

## 2020-05-31 ENCOUNTER — Encounter
Admission: EM | Disposition: A | Discharge status: Home / Self Care | Payer: Self-pay | Source: Home / Self Care | Attending: Obstetrics and Gynecology

## 2020-05-31 ENCOUNTER — Ambulatory Visit: Payer: No Typology Code available for payment source | Admitting: Surgery

## 2020-05-31 ENCOUNTER — Other Ambulatory Visit: Payer: Self-pay

## 2020-05-31 ENCOUNTER — Inpatient Hospital Stay (HOSPITAL_COMMUNITY)
Admit: 2020-05-31 | Discharge: 2020-05-31 | Disposition: A | Discharge status: Home / Self Care | Payer: BLUE CROSS/BLUE SHIELD | Attending: Internal Medicine | Admitting: Internal Medicine

## 2020-05-31 DIAGNOSIS — Z20822 Contact with and (suspected) exposure to covid-19: Secondary | ICD-10-CM | POA: Diagnosis present

## 2020-05-31 DIAGNOSIS — I10 Essential (primary) hypertension: Secondary | ICD-10-CM | POA: Diagnosis present

## 2020-05-31 DIAGNOSIS — I2510 Atherosclerotic heart disease of native coronary artery without angina pectoris: Secondary | ICD-10-CM | POA: Diagnosis not present

## 2020-05-31 DIAGNOSIS — I361 Nonrheumatic tricuspid (valve) insufficiency: Secondary | ICD-10-CM | POA: Diagnosis not present

## 2020-05-31 DIAGNOSIS — E039 Hypothyroidism, unspecified: Secondary | ICD-10-CM | POA: Diagnosis not present

## 2020-05-31 DIAGNOSIS — I214 Non-ST elevation (NSTEMI) myocardial infarction: Secondary | ICD-10-CM | POA: Diagnosis present

## 2020-05-31 DIAGNOSIS — R079 Chest pain, unspecified: Secondary | ICD-10-CM

## 2020-05-31 DIAGNOSIS — Z888 Allergy status to other drugs, medicaments and biological substances status: Secondary | ICD-10-CM | POA: Diagnosis not present

## 2020-05-31 DIAGNOSIS — Z955 Presence of coronary angioplasty implant and graft: Secondary | ICD-10-CM | POA: Diagnosis not present

## 2020-05-31 DIAGNOSIS — E213 Hyperparathyroidism, unspecified: Secondary | ICD-10-CM | POA: Diagnosis present

## 2020-05-31 DIAGNOSIS — I25118 Atherosclerotic heart disease of native coronary artery with other forms of angina pectoris: Secondary | ICD-10-CM | POA: Diagnosis present

## 2020-05-31 DIAGNOSIS — E785 Hyperlipidemia, unspecified: Secondary | ICD-10-CM | POA: Diagnosis present

## 2020-05-31 DIAGNOSIS — Z87891 Personal history of nicotine dependence: Secondary | ICD-10-CM | POA: Diagnosis not present

## 2020-05-31 DIAGNOSIS — I428 Other cardiomyopathies: Secondary | ICD-10-CM

## 2020-05-31 DIAGNOSIS — I252 Old myocardial infarction: Secondary | ICD-10-CM | POA: Diagnosis not present

## 2020-05-31 DIAGNOSIS — I5181 Takotsubo syndrome: Secondary | ICD-10-CM | POA: Diagnosis present

## 2020-05-31 DIAGNOSIS — E063 Autoimmune thyroiditis: Secondary | ICD-10-CM | POA: Diagnosis present

## 2020-05-31 DIAGNOSIS — Z8249 Family history of ischemic heart disease and other diseases of the circulatory system: Secondary | ICD-10-CM | POA: Diagnosis not present

## 2020-05-31 DIAGNOSIS — Z79899 Other long term (current) drug therapy: Secondary | ICD-10-CM | POA: Diagnosis not present

## 2020-05-31 DIAGNOSIS — Z79890 Hormone replacement therapy (postmenopausal): Secondary | ICD-10-CM | POA: Diagnosis not present

## 2020-05-31 HISTORY — PX: LEFT HEART CATH AND CORONARY ANGIOGRAPHY: CATH118249

## 2020-05-31 LAB — COMPREHENSIVE METABOLIC PANEL
ALT: 20 U/L (ref 0–44)
AST: 33 U/L (ref 15–41)
Albumin: 4.4 g/dL (ref 3.5–5.0)
Alkaline Phosphatase: 34 U/L — ABNORMAL LOW (ref 38–126)
Anion gap: 10 (ref 5–15)
BUN: 23 mg/dL — ABNORMAL HIGH (ref 6–20)
CO2: 25 mmol/L (ref 22–32)
Calcium: 9.7 mg/dL (ref 8.9–10.3)
Chloride: 105 mmol/L (ref 98–111)
Creatinine, Ser: 0.87 mg/dL (ref 0.44–1.00)
GFR calc Af Amer: >60 mL/min (ref >60)
GFR calc non Af Amer: >60 mL/min (ref >60)
Glucose, Bld: 103 mg/dL — ABNORMAL HIGH (ref 70–99)
Potassium: 3.8 mmol/L (ref 3.5–5.1)
Sodium: 140 mmol/L (ref 135–145)
Total Bilirubin: 0.7 mg/dL (ref 0.3–1.2)
Total Protein: 7.3 g/dL (ref 6.5–8.1)

## 2020-05-31 LAB — CBC
HCT: 33 % — ABNORMAL LOW (ref 36.0–46.0)
Hemoglobin: 11.3 g/dL — ABNORMAL LOW (ref 12.0–15.0)
MCH: 33.2 pg (ref 26.0–34.0)
MCHC: 34.2 g/dL (ref 30.0–36.0)
MCV: 97.1 fL (ref 80.0–100.0)
Platelets: 347 10*3/uL (ref 150–400)
RBC: 3.4 MIL/uL — ABNORMAL LOW (ref 3.87–5.11)
RDW: 14.2 % (ref 11.5–15.5)
WBC: 8.9 10*3/uL (ref 4.0–10.5)
nRBC: 0 % (ref 0.0–0.2)

## 2020-05-31 LAB — APTT: aPTT: 29 seconds (ref 24–36)

## 2020-05-31 LAB — BASIC METABOLIC PANEL
Anion gap: 9 (ref 5–15)
BUN: 19 mg/dL (ref 6–20)
CO2: 23 mmol/L (ref 22–32)
Calcium: 8.8 mg/dL — ABNORMAL LOW (ref 8.9–10.3)
Chloride: 107 mmol/L (ref 98–111)
Creatinine, Ser: 0.7 mg/dL (ref 0.44–1.00)
GFR calc Af Amer: >60 mL/min (ref >60)
GFR calc non Af Amer: >60 mL/min (ref >60)
Glucose, Bld: 102 mg/dL — ABNORMAL HIGH (ref 70–99)
Potassium: 3.8 mmol/L (ref 3.5–5.1)
Sodium: 139 mmol/L (ref 135–145)

## 2020-05-31 LAB — CBC WITH DIFFERENTIAL/PLATELET
Abs Immature Granulocytes: 0.02 10*3/uL (ref 0.00–0.07)
Basophils Absolute: 0.1 10*3/uL (ref 0.0–0.1)
Basophils Relative: 1 %
Eosinophils Absolute: 0.1 10*3/uL (ref 0.0–0.5)
Eosinophils Relative: 1 %
HCT: 36.9 % (ref 36.0–46.0)
Hemoglobin: 12.8 g/dL (ref 12.0–15.0)
Immature Granulocytes: 0 %
Lymphocytes Relative: 23 %
Lymphs Abs: 2.2 10*3/uL (ref 0.7–4.0)
MCH: 32.5 pg (ref 26.0–34.0)
MCHC: 34.7 g/dL (ref 30.0–36.0)
MCV: 93.7 fL (ref 80.0–100.0)
Monocytes Absolute: 0.7 10*3/uL (ref 0.1–1.0)
Monocytes Relative: 8 %
Neutro Abs: 6.5 10*3/uL (ref 1.7–7.7)
Neutrophils Relative %: 67 %
Platelets: 407 10*3/uL — ABNORMAL HIGH (ref 150–400)
RBC: 3.94 MIL/uL (ref 3.87–5.11)
RDW: 14 % (ref 11.5–15.5)
WBC: 9.7 10*3/uL (ref 4.0–10.5)
nRBC: 0 % (ref 0.0–0.2)

## 2020-05-31 LAB — TSH: TSH: 3.463 u[IU]/mL (ref 0.350–4.500)

## 2020-05-31 LAB — TROPONIN I (HIGH SENSITIVITY)
Troponin I (High Sensitivity): 5910 ng/L (ref <18)
Troponin I (High Sensitivity): 6301 ng/L (ref <18)

## 2020-05-31 LAB — LIPASE, BLOOD: Lipase: 37 U/L (ref 11–51)

## 2020-05-31 LAB — SARS CORONAVIRUS 2 BY RT PCR (HOSPITAL ORDER, PERFORMED IN ~~LOC~~ HOSPITAL LAB): SARS Coronavirus 2: NEGATIVE

## 2020-05-31 LAB — PROTIME-INR
INR: 0.9 (ref 0.8–1.2)
Prothrombin Time: 11.8 seconds (ref 11.4–15.2)

## 2020-05-31 LAB — HIV ANTIBODY (ROUTINE TESTING W REFLEX): HIV Screen 4th Generation wRfx: NONREACTIVE

## 2020-05-31 IMAGING — CT CT ANGIO CHEST-ABD-PELV FOR DISSECTION W/ AND WO/W CM
2 of 7 series · 13 of 46 positions shown, 15 images · IV contrast (APPLIED)
Comparison: None.

CLINICAL DATA: Chest pain and back pain with abnormal stress test

EXAM:
CT ANGIOGRAPHY CHEST, ABDOMEN AND PELVIS
TECHNIQUE: Non-contrast CT of the chest was initially obtained.

[Series 5: axial arterial · axial · arterial · 0.73mm/px · z∈[-784,-229]mm · 10 of 215 slices shown, 12 images]
[im 15/215  soft-tissue]
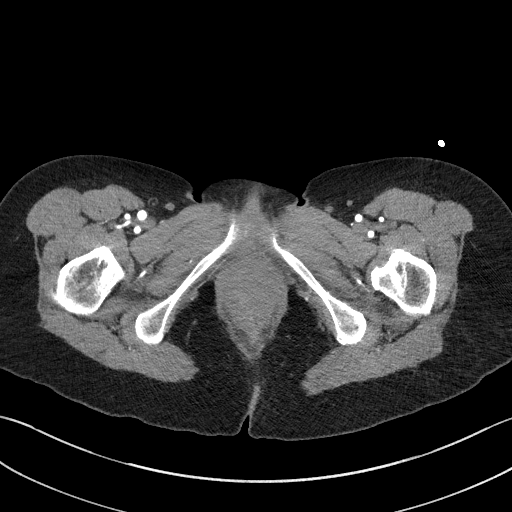
[im 15/215  bone]
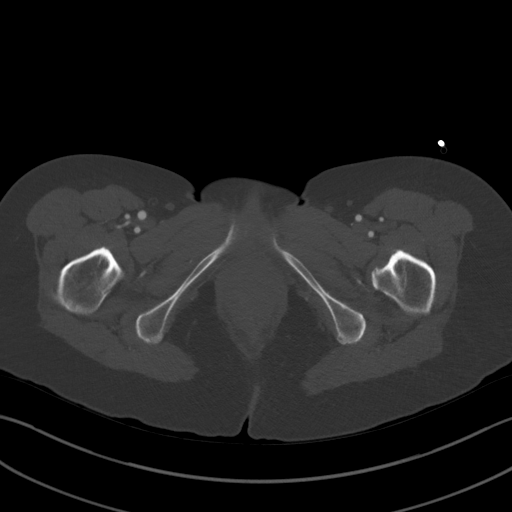
[im 43/215  soft-tissue]
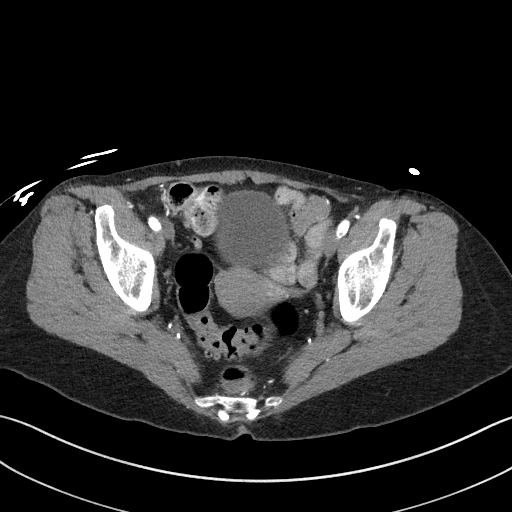
[im 58/215  soft-tissue]
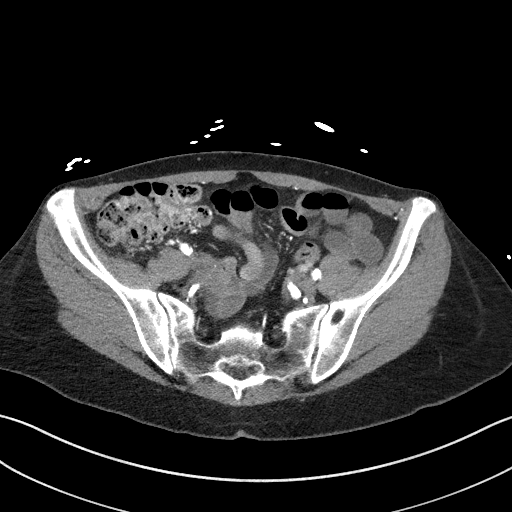
[im 72/215  soft-tissue]
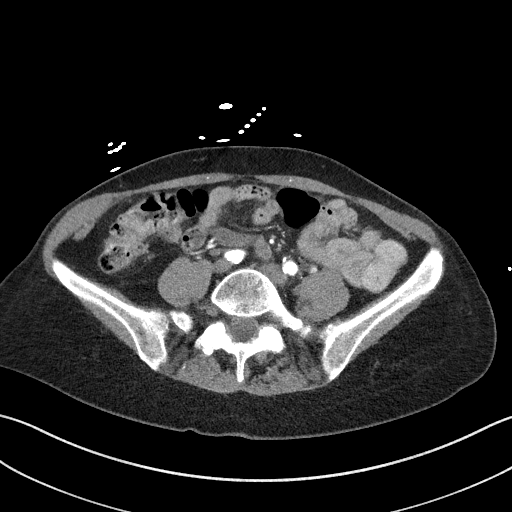
[im 100/215  soft-tissue]
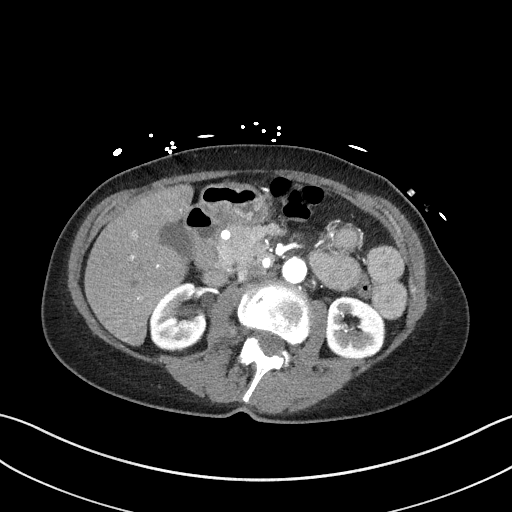
[im 115/215  soft-tissue]
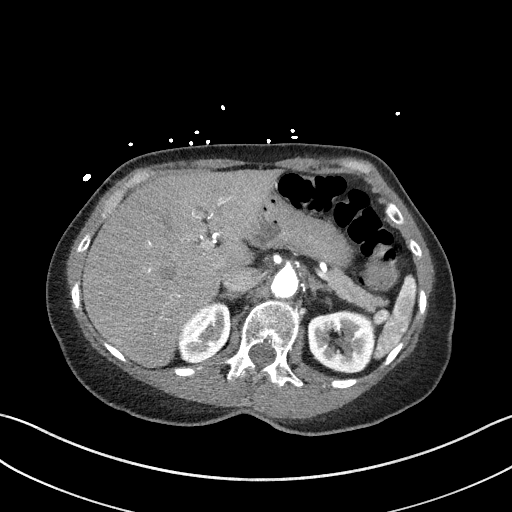
[im 143/215  soft-tissue]
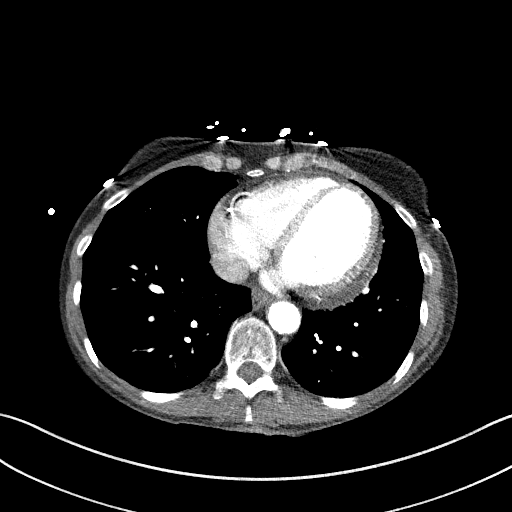
[im 157/215  soft-tissue]
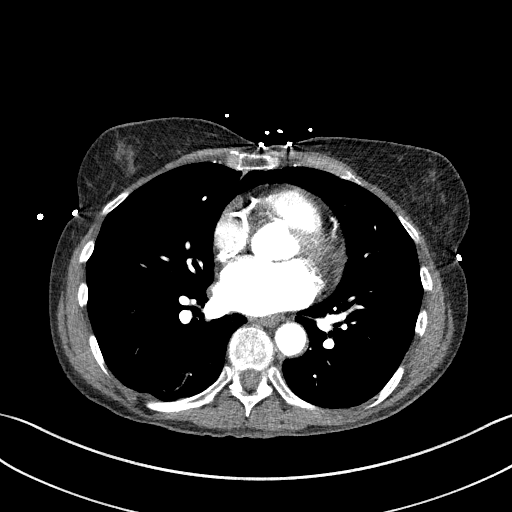
[im 172/215  soft-tissue]
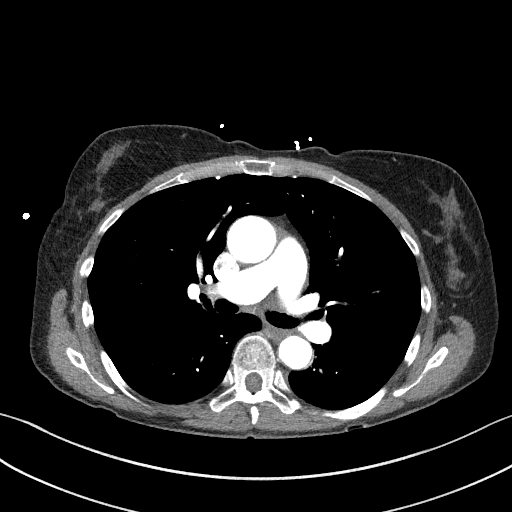
[im 172/215  bone]
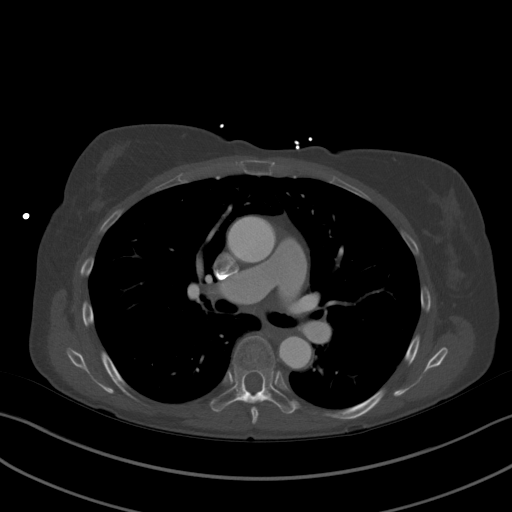
[im 200/215  soft-tissue]
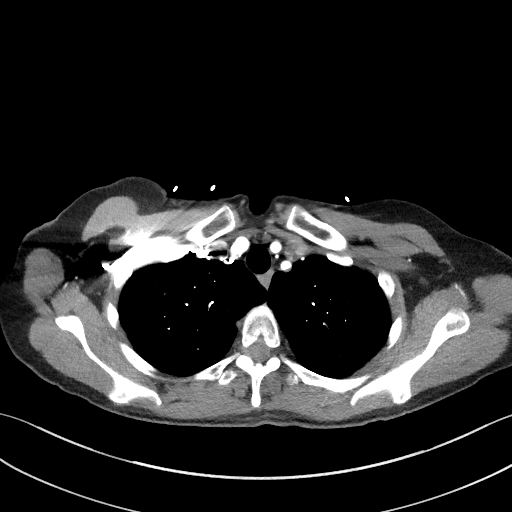

[Series 7: coronals · coronal · 0.73mm/px · 3 of 105 slices shown]
[im 27/105  soft-tissue]
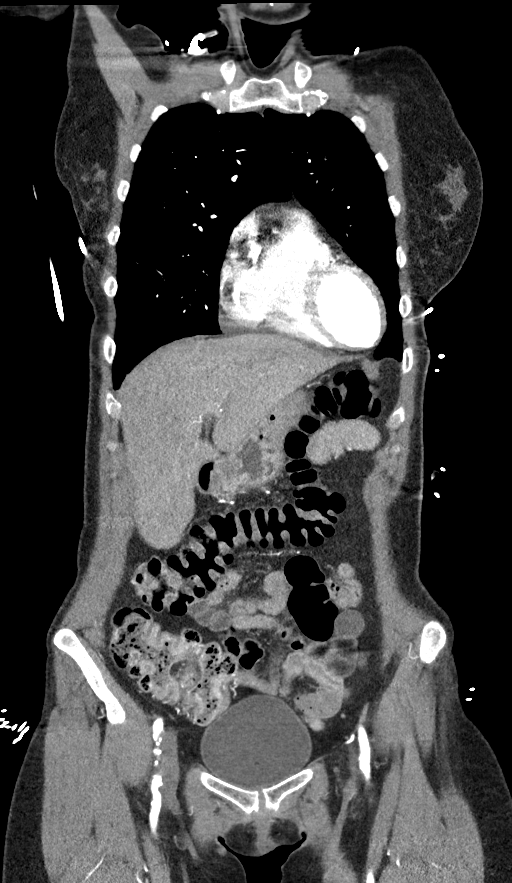
[im 53/105  soft-tissue]
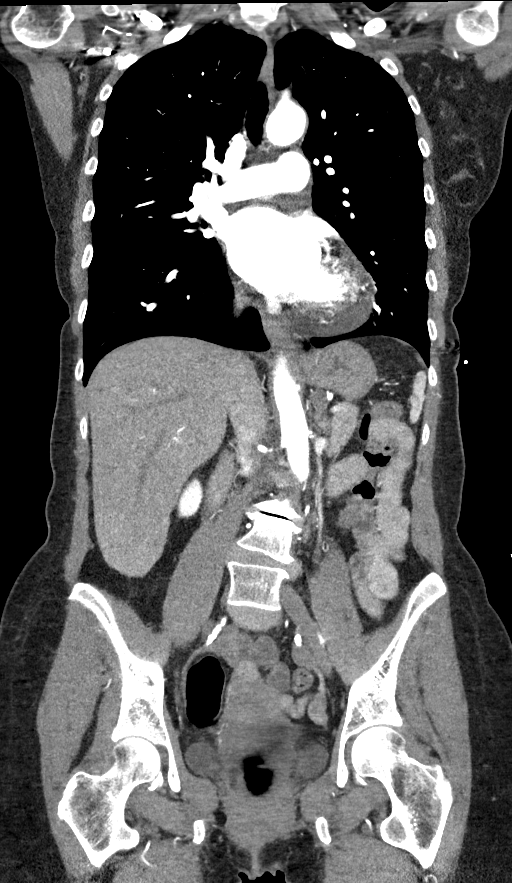
[im 79/105  soft-tissue]
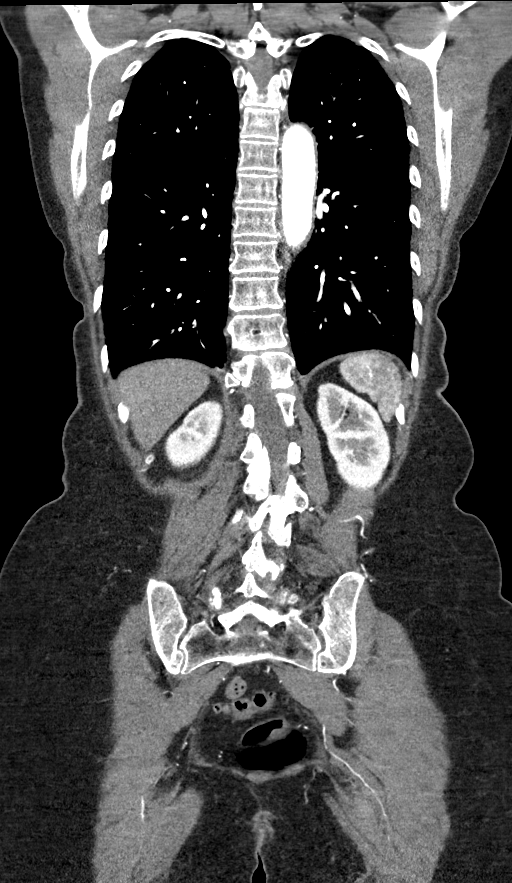

[13 of 46 positions shown; findings below may reference images not displayed]

Multidetector CT imaging through the chest, abdomen and pelvis was
performed using the standard protocol during bolus administration of
intravenous contrast. Multiplanar reconstructed images and MIPs were
obtained and reviewed to evaluate the vascular anatomy.

CONTRAST:  100mL OMNIPAQUE IOHEXOL 350 MG/ML SOLN
FINDINGS: CTA CHEST FINDINGS

Cardiovascular:

--Heart: The heart size is normal. There is a smallpericardial
effusion. There are coronary artery calcifications.

--Aorta: The course and caliber of the thoracic aorta are normal.
There is mild aortic atherosclerotic calcification. Precontrast
images show no aortic intramural hematoma. There is no blood pool,
dissection or penetrating ulcer demonstrated on arterial phase
postcontrast imaging. There is a conventional 3 vessel aortic arch
branching pattern. The proximal arch vessels are widely patent.

--Pulmonary Arteries: Contrast timing is optimized for preferential
opacification of the aorta. Within that limitation, normal central
pulmonary arteries.

Mediastinum/Nodes: No mediastinal, hilar or axillary
lymphadenopathy. The visualized thyroid and thoracic esophageal
course are unremarkable.

Lungs/Pleura: No pulmonary nodules or masses. No pleural effusion or
pneumothorax. No focal airspace consolidation. No focal pleural
abnormality.

Musculoskeletal: No chest wall abnormality. No acute osseous
findings.

Review of the MIP images confirms the above findings.

CTA ABDOMEN AND PELVIS FINDINGS

VASCULAR

Aorta: Normal caliber aorta without aneurysm, dissection, vasculitis
or hemodynamically significant stenosis. There is mild aortic
atherosclerosis.

Celiac: There is severe stenosis of the proximal celiac axis with an
angiographic string sign (series 10, image 93). The distal vessel is
patent.

SMA: Widely patent without dissection or stenosis.

Renals: Single renal arteries bilaterally. No aneurysm, dissection,
stenosis or evidence of fibromuscular dysplasia.

IMA: Stenotic origin but patent

Inflow: Moderate-to-severe atherosclerotic stenosis of the internal
iliac arteries. There is moderate atherosclerosis of the common and
external iliac arteries without high-grade stenosis.

Veins: Normal course and caliber of the major veins. Assessment is
otherwise limited by the arterial dominant contrast phase.

Review of the MIP images confirms the above findings.

NON-VASCULAR

Hepatobiliary: Normal hepatic contours and density. No visible
biliary dilatation. Normal gallbladder.

Pancreas: Normal contours without ductal dilatation. No
peripancreatic fluid collection.

Spleen: Normal arterial phase splenic enhancement pattern.

Adrenals/Urinary Tract:

--Adrenal glands: Normal.

--Right kidney/ureter: No hydronephrosis or perinephric stranding.
No nephrolithiasis. No obstructing ureteral stones.

--Left kidney/ureter: No hydronephrosis or perinephric stranding. No
nephrolithiasis. No obstructing ureteral stones.

--Urinary bladder: Unremarkable.

Stomach/Bowel:

--Stomach/Duodenum: No hiatal hernia or other gastric abnormality.
Normal duodenal course and caliber.

--Small bowel: No dilatation or inflammation.

--Colon: No focal abnormality.

--Appendix: Normal.

Lymphatic:  No abdominal or pelvic lymphadenopathy.

Reproductive: Normal uterus and ovaries.

Musculoskeletal. No bony spinal canal stenosis or focal osseous
abnormality.

Other: None.

Review of the MIP images confirms the above findings.
IMPRESSION: 1. No acute aortic syndrome.
2. Severe stenosis of the proximal celiac axis with an angiographic
string sign. The distal vessel is patent.
3. Moderate-to-severe atherosclerotic stenosis of the internal iliac
arteries.
4. Small pericardial effusion and coronary artery atherosclerosis.

Aortic Atherosclerosis ([6B]-[6B]).

## 2020-05-31 SURGERY — LEFT HEART CATH AND CORONARY ANGIOGRAPHY
Anesthesia: Moderate Sedation

## 2020-05-31 MED ORDER — ONDANSETRON HCL 4 MG/2ML IJ SOLN: 4.0000 mg | Freq: Four times a day (QID) | INTRAMUSCULAR | Status: DC | PRN

## 2020-05-31 MED ORDER — SODIUM CHLORIDE 0.9 % IV SOLN: 250.0000 mL | INTRAVENOUS | Status: DC | PRN

## 2020-05-31 MED ORDER — ASPIRIN EC 81 MG PO TBEC: 81.0000 mg | DELAYED_RELEASE_TABLET | Freq: Every day | ORAL | Status: DC

## 2020-05-31 MED ORDER — FENTANYL CITRATE (PF) 100 MCG/2ML IJ SOLN
INTRAMUSCULAR | Status: AC
  Filled 2020-05-31: qty 2

## 2020-05-31 MED ORDER — ACETAMINOPHEN 325 MG PO TABS
ORAL_TABLET | ORAL | Status: AC
  Filled 2020-05-31: qty 2

## 2020-05-31 MED ORDER — TICAGRELOR 60 MG PO TABS
60.0000 mg | ORAL_TABLET | Freq: Two times a day (BID) | ORAL | Status: DC
  Administered 2020-05-31 – 2020-06-01 (×2): 60 mg via ORAL
  Filled 2020-05-31 (×4): qty 1

## 2020-05-31 MED ORDER — LEVOTHYROXINE SODIUM 50 MCG PO TABS: 75.0000 ug | ORAL_TABLET | Freq: Every day | ORAL | Status: DC

## 2020-05-31 MED ORDER — PANTOPRAZOLE SODIUM 40 MG PO TBEC
40.0000 mg | DELAYED_RELEASE_TABLET | Freq: Every day | ORAL | Status: DC
  Administered 2020-06-01: 40 mg via ORAL
  Filled 2020-05-31: qty 1

## 2020-05-31 MED ORDER — ENOXAPARIN SODIUM 40 MG/0.4ML ~~LOC~~ SOLN
40.0000 mg | SUBCUTANEOUS | Status: DC
  Administered 2020-06-01: 40 mg via SUBCUTANEOUS
  Filled 2020-05-31 (×2): qty 0.4

## 2020-05-31 MED ORDER — VERAPAMIL HCL 2.5 MG/ML IV SOLN
INTRAVENOUS | Status: DC | PRN
  Administered 2020-05-31: 2.5 mg via INTRA_ARTERIAL

## 2020-05-31 MED ORDER — ASPIRIN 81 MG PO CHEW: 324.0000 mg | CHEWABLE_TABLET | ORAL | Status: DC

## 2020-05-31 MED ORDER — LABETALOL HCL 5 MG/ML IV SOLN: 10.0000 mg | INTRAVENOUS | Status: AC | PRN

## 2020-05-31 MED ORDER — ALPRAZOLAM 0.25 MG PO TABS: 0.2500 mg | ORAL_TABLET | Freq: Two times a day (BID) | ORAL | Status: DC | PRN

## 2020-05-31 MED ORDER — MIDAZOLAM HCL 2 MG/2ML IJ SOLN
INTRAMUSCULAR | Status: DC | PRN
  Administered 2020-05-31: 1 mg via INTRAVENOUS

## 2020-05-31 MED ORDER — ZOLPIDEM TARTRATE 5 MG PO TABS: 5.0000 mg | ORAL_TABLET | Freq: Every evening | ORAL | Status: DC | PRN

## 2020-05-31 MED ORDER — HEPARIN (PORCINE) 25000 UT/250ML-% IV SOLN
900.0000 [IU]/h | INTRAVENOUS | Status: DC
  Administered 2020-05-31: 900 [IU]/h via INTRAVENOUS

## 2020-05-31 MED ORDER — HYDRALAZINE HCL 20 MG/ML IJ SOLN: 10.0000 mg | INTRAMUSCULAR | Status: AC | PRN

## 2020-05-31 MED ORDER — HEPARIN (PORCINE) IN NACL 1000-0.9 UT/500ML-% IV SOLN
INTRAVENOUS | Status: DC | PRN
  Administered 2020-05-31: 500 mL

## 2020-05-31 MED ORDER — CARVEDILOL 3.125 MG PO TABS
3.1250 mg | ORAL_TABLET | Freq: Two times a day (BID) | ORAL | Status: DC
  Administered 2020-05-31: 3.125 mg via ORAL
  Filled 2020-05-31: qty 1

## 2020-05-31 MED ORDER — ATORVASTATIN CALCIUM 80 MG PO TABS
80.0000 mg | ORAL_TABLET | Freq: Every day | ORAL | Status: DC
  Administered 2020-06-01: 80 mg via ORAL
  Filled 2020-05-31: qty 1

## 2020-05-31 MED ORDER — METOPROLOL TARTRATE 25 MG PO TABS: 25.0000 mg | ORAL_TABLET | Freq: Two times a day (BID) | ORAL | Status: DC

## 2020-05-31 MED ORDER — METOPROLOL TARTRATE 25 MG PO TABS: 25.0000 mg | ORAL_TABLET | Freq: Every day | ORAL | Status: DC

## 2020-05-31 MED ORDER — IOHEXOL 300 MG/ML  SOLN
INTRAMUSCULAR | Status: DC | PRN
  Administered 2020-05-31: 45 mL

## 2020-05-31 MED ORDER — LISINOPRIL 2.5 MG PO TABS
2.5000 mg | ORAL_TABLET | Freq: Every day | ORAL | Status: DC
  Administered 2020-05-31 – 2020-06-01 (×2): 2.5 mg via ORAL
  Filled 2020-05-31 (×2): qty 1

## 2020-05-31 MED ORDER — SODIUM CHLORIDE 0.9% FLUSH
3.0000 mL | Freq: Two times a day (BID) | INTRAVENOUS | Status: DC
  Administered 2020-05-31: 3 mL via INTRAVENOUS

## 2020-05-31 MED ORDER — NITROGLYCERIN 0.4 MG SL SUBL: 0.4000 mg | SUBLINGUAL_TABLET | SUBLINGUAL | Status: DC | PRN

## 2020-05-31 MED ORDER — VERAPAMIL HCL 2.5 MG/ML IV SOLN
INTRAVENOUS | Status: AC
  Filled 2020-05-31: qty 2

## 2020-05-31 MED ORDER — ACETAMINOPHEN 325 MG PO TABS
650.0000 mg | ORAL_TABLET | ORAL | Status: DC | PRN
  Administered 2020-05-31 (×2): 650 mg via ORAL
  Filled 2020-05-31: qty 2

## 2020-05-31 MED ORDER — HEPARIN (PORCINE) IN NACL 1000-0.9 UT/500ML-% IV SOLN
INTRAVENOUS | Status: AC
  Filled 2020-05-31: qty 1000

## 2020-05-31 MED ORDER — MIDAZOLAM HCL 2 MG/2ML IJ SOLN
INTRAMUSCULAR | Status: AC
  Filled 2020-05-31: qty 2

## 2020-05-31 MED ORDER — SODIUM CHLORIDE 0.9% FLUSH: 3.0000 mL | INTRAVENOUS | Status: DC | PRN

## 2020-05-31 MED ORDER — HEPARIN SODIUM (PORCINE) 1000 UNIT/ML IJ SOLN
INTRAMUSCULAR | Status: DC | PRN
  Administered 2020-05-31: 3000 [IU] via INTRAVENOUS

## 2020-05-31 MED ORDER — SODIUM CHLORIDE 0.9% FLUSH
3.0000 mL | Freq: Two times a day (BID) | INTRAVENOUS | Status: DC
  Administered 2020-06-01: 3 mL via INTRAVENOUS

## 2020-05-31 MED ORDER — FENTANYL CITRATE (PF) 100 MCG/2ML IJ SOLN
INTRAMUSCULAR | Status: DC | PRN
Start: 2020-05-31 — End: 2020-05-31
  Administered 2020-05-31: 25 ug via INTRAVENOUS

## 2020-05-31 MED ORDER — HEPARIN SODIUM (PORCINE) 1000 UNIT/ML IJ SOLN
INTRAMUSCULAR | Status: AC
  Filled 2020-05-31: qty 1

## 2020-05-31 MED ORDER — ASPIRIN 81 MG PO CHEW: 81.0000 mg | CHEWABLE_TABLET | ORAL | Status: DC

## 2020-05-31 MED ORDER — HEPARIN BOLUS VIA INFUSION
4000.0000 [IU] | Freq: Once | INTRAVENOUS | Status: AC
  Administered 2020-05-31: 4000 [IU] via INTRAVENOUS
  Filled 2020-05-31: qty 4000

## 2020-05-31 MED ORDER — NITROGLYCERIN IN D5W 200-5 MCG/ML-% IV SOLN
0.0000 ug/min | INTRAVENOUS | Status: DC
  Administered 2020-05-31: 5 ug/min via INTRAVENOUS
  Filled 2020-05-31: qty 250

## 2020-05-31 MED ORDER — SODIUM CHLORIDE 0.9 % IV SOLN: INTRAVENOUS | Status: DC

## 2020-05-31 MED ORDER — FUROSEMIDE 10 MG/ML IJ SOLN
20.0000 mg | Freq: Two times a day (BID) | INTRAMUSCULAR | Status: DC
  Administered 2020-05-31 – 2020-06-01 (×2): 20 mg via INTRAVENOUS
  Filled 2020-05-31 (×2): qty 2

## 2020-05-31 MED ORDER — LEVOTHYROXINE SODIUM 50 MCG PO TABS
75.0000 ug | ORAL_TABLET | Freq: Every day | ORAL | Status: DC
  Administered 2020-06-01: 75 ug via ORAL
  Filled 2020-05-31: qty 1

## 2020-05-31 MED ORDER — LACTATED RINGERS IV SOLN: INTRAVENOUS | Status: DC

## 2020-05-31 MED ORDER — SODIUM CHLORIDE 0.9 % IV SOLN: INTRAVENOUS | Status: AC

## 2020-05-31 MED ORDER — ATORVASTATIN CALCIUM 20 MG PO TABS: 20.0000 mg | ORAL_TABLET | Freq: Every day | ORAL | Status: DC

## 2020-05-31 MED ORDER — AMLODIPINE BESYLATE 5 MG PO TABS: 2.5000 mg | ORAL_TABLET | Freq: Every day | ORAL | Status: DC

## 2020-05-31 MED ORDER — ASPIRIN 300 MG RE SUPP: 300.0000 mg | RECTAL | Status: DC

## 2020-05-31 SURGICAL SUPPLY — 9 items
CATH INFINITI 5 FR JL3.5 (CATHETERS) ×2 IMPLANT
CATH INFINITI JR4 5F (CATHETERS) ×2 IMPLANT
DEVICE INFLAT 30 PLUS (MISCELLANEOUS) IMPLANT
DEVICE RAD TR BAND REGULAR (VASCULAR PRODUCTS) ×2 IMPLANT
GLIDESHEATH SLEND SS 6F .021 (SHEATH) ×2 IMPLANT
GUIDEWIRE INQWIRE 1.5J.035X260 (WIRE) IMPLANT
INQWIRE 1.5J .035X260CM (WIRE) ×3
KIT MANI 3VAL PERCEP (MISCELLANEOUS) ×3 IMPLANT
PACK CARDIAC CATH (CUSTOM PROCEDURE TRAY) ×3 IMPLANT

## 2020-05-31 NOTE — ED Notes (Signed)
Critical trop reported to Dr. Saunders Revel who was at bedside.

## 2020-05-31 NOTE — Progress Notes (Signed)
PROGRESS NOTE    April Raymond  TDD:[id number] DOB: 1961/03/01 DOA: 05/30/2020 PCP: Venita Lick, NP  Outpatient Specialists: [facility] cardiology    Brief Narrative:  Presented 9/14 with chest pain. Had had an abnormal exercise stress test earlier in the day. Admitted overnight with nstemi troponin 5-6,000. Cardiac cath 9/15 PM showing non-obstructive CAD, etiology thought to be stress-induced cardiomyopathy.    Assessment & Plan:   Principal Problem:   Stress-induced cardiomyopathy Active Problems:   History of MI (myocardial infarction)   CAD in native artery   Essential hypertension   Hashimoto's disease  # stress-induced cardiomyopathy - non-obstructive CAD on cath today. Currently asymptomatic. - per cardiology stopping heparin gtt and nitroglycerine, substituting lisinopril for amlodipine, starting coreg and lasix and brilinta - f/u TTE  # HTN - here bp stable  # Hashimoto's - tsh pending, cont synthyroid   DVT prophylaxis: lovenox to resume tomorrow Code Status: full Family Communication: husband updated at bedside Disposition Plan: home, possibly tomorrow   Consultants:   cardiology  Procedures:   Left heart cath   Antimicrobials:  none    Subjective: Feeling well after cath. No chest pain or dyspnea. No abd pain. Hungry.   Objective: Vitals:   05/31/20 1300 05/31/20 1400 05/31/20 1501 05/31/20 1545  BP: (!) 103/53 (!) 102/59 (!) 101/55 117/61  Pulse: 69 73 72 75  Resp: 16 16 12 18   Temp:    98.5 F (36.9 C)  TempSrc:    Oral  SpO2: 96% 97% 95% 98%  Weight:      Height:       No intake or output data in the 24 hours ending 05/31/20 1634 Filed Weights   05/30/20 2356 05/31/20 0825  Weight: 63.5 kg 63.5 kg    Examination:  General exam: Appears calm and comfortable  Respiratory system: Clear to auscultation. Respiratory effort normal. Cardiovascular system: S1 & S2 heard, RRR. Soft systolic murmur Gastrointestinal system:  Abdomen is nondistended, soft and nontender. No organomegaly or masses felt. Normal bowel sounds heard. Central nervous system: Alert and oriented. No focal neurological deficits. Extremities: Symmetric 5 x 5 power. Skin: No rashes, lesions or ulcers Psychiatry: Judgement and insight appear normal. Mood & affect appropriate.     Data Reviewed: I have personally reviewed following labs and imaging studies  CBC: Recent Labs  Lab 05/30/20 2348 05/31/20 0406  WBC 9.7 8.9  NEUTROABS 6.5  --   HGB 12.8 11.3*  HCT 36.9 33.0*  MCV 93.7 97.1  PLT 407* 623   Basic Metabolic Panel: Recent Labs  Lab 05/30/20 2348 05/31/20 0406  NA 140 139  K 3.8 3.8  CL 105 107  CO2 25 23  GLUCOSE 103* 102*  BUN 23* 19  CREATININE 0.87 0.70  CALCIUM 9.7 8.8*   GFR: Estimated Creatinine Clearance: 70 mL/min (by C-G formula based on SCr of 0.7 mg/dL). Liver Function Tests: Recent Labs  Lab 05/30/20 2348  AST 33  ALT 20  ALKPHOS 34*  BILITOT 0.7  PROT 7.3  ALBUMIN 4.4   Recent Labs  Lab 05/30/20 2348  LIPASE 37   No results for input(s): AMMONIA in the last 168 hours. Coagulation Profile: Recent Labs  Lab 05/30/20 2348  INR 0.9   Cardiac Enzymes: No results for input(s): CKTOTAL, CKMB, CKMBINDEX, TROPONINI in the last 168 hours. BNP (last 3 results) No results for input(s): PROBNP in the last 8760 hours. HbA1C: No results for input(s): HGBA1C in the last 72 hours. CBG: No  results for input(s): GLUCAP in the last 168 hours. Lipid Profile: No results for input(s): CHOL, HDL, LDLCALC, TRIG, CHOLHDL, LDLDIRECT in the last 72 hours. Thyroid Function Tests: No results for input(s): TSH, T4TOTAL, FREET4, T3FREE, THYROIDAB in the last 72 hours. Anemia Panel: No results for input(s): VITAMINB12, FOLATE, FERRITIN, TIBC, IRON, RETICCTPCT in the last 72 hours. Urine analysis:    Component Value Date/Time   BILIRUBINUR negative 03/06/2020 1157   KETONESUR negative 03/06/2020 1157    PROTEINUR negative 03/06/2020 1157   UROBILINOGEN 0.2 03/06/2020 1157   NITRITE Negative 03/06/2020 1157   LEUKOCYTESUR Negative 03/06/2020 1157   Sepsis Labs: @LABRCNTIP (procalcitonin:4,lacticidven:4)  ) Recent Results (from the past 240 hour(s))  SARS CORONAVIRUS 2 (TAT 6-24 HRS) Nasopharyngeal Nasopharyngeal Swab     Status: None   Collection Time: 05/29/20  3:19 PM   Specimen: Nasopharyngeal Swab  Result Value Ref Range Status   SARS Coronavirus 2 NEGATIVE NEGATIVE Final    Comment: (NOTE) SARS-CoV-2 target nucleic acids are NOT DETECTED.  The SARS-CoV-2 RNA is generally detectable in upper and lower respiratory specimens during the acute phase of infection. Negative results do not preclude SARS-CoV-2 infection, do not rule out co-infections with other pathogens, and should not be used as the sole basis for treatment or other patient management decisions. Negative results must be combined with clinical observations, patient history, and epidemiological information. The expected result is Negative.  Fact Sheet for Patients: SugarRoll.be  Fact Sheet for Healthcare Providers: https://www.woods-mathews.com/  This test is not yet approved or cleared by the [place] FDA and  has been authorized for detection and/or diagnosis of SARS-CoV-2 by FDA under an Emergency Use Authorization (EUA). This EUA will remain  in effect (meaning this test can be used) for the duration of the COVID-19 declaration under Se ction 564(b)(1) of the Act, 21 U.S.C. section 360bbb-3(b)(1), unless the authorization is terminated or revoked sooner.  Performed at [facility], [place] [address] [place], [place] [address]   SARS Coronavirus 2 by RT PCR (hospital order, performed in [facility] hospital lab) Nasopharyngeal Nasopharyngeal Swab     Status: None   Collection Time: 05/30/20 12:15 AM   Specimen: Nasopharyngeal Swab  Result Value Ref Range  Status   SARS Coronavirus 2 NEGATIVE NEGATIVE Final    Comment: (NOTE) SARS-CoV-2 target nucleic acids are NOT DETECTED.  The SARS-CoV-2 RNA is generally detectable in upper and lower respiratory specimens during the acute phase of infection. The lowest concentration of SARS-CoV-2 viral copies this assay can detect is 250 copies / mL. A negative result does not preclude SARS-CoV-2 infection and should not be used as the sole basis for treatment or other patient management decisions.  A negative result may occur with improper specimen collection / handling, submission of specimen other than nasopharyngeal swab, presence of viral mutation(s) within the areas targeted by this assay, and inadequate number of viral copies (<250 copies / mL). A negative result must be combined with clinical observations, patient history, and epidemiological information.  Fact Sheet for Patients:   StrictlyIdeas.no  Fact Sheet for Healthcare Providers: BankingDealers.co.za  This test is not yet approved or  cleared by the [place] FDA and has been authorized for detection and/or diagnosis of SARS-CoV-2 by FDA under an Emergency Use Authorization (EUA).  This EUA will remain in effect (meaning this test can be used) for the duration of the COVID-19 declaration under Section 564(b)(1) of the Act, 21 U.S.C. section 360bbb-3(b)(1), unless the authorization is  terminated or revoked sooner.  Performed at [facility], [address], [place], [place] [address]          Radiology Studies: CARDIAC CATHETERIZATION  Result Date: 05/31/2020 Conclusions: 1. Mild to moderate, nonobstructive coronary artery disease including 40% mid LAD stenosis and 30 to 40% ostial RCA lesion. 2. Widely patent overlapping stents extending from the proximal through distal RCA without significant in-stent restenosis. 3. Severely reduced left ventricular systolic  function with akinesis of the mid and apical segments in a pattern consistent with stress-induced (Takotsubo) cardiomyopathy. 4. Moderately elevated left ventricular filling pressure (LVEDP 25-30 mmHg). Recommendations: 1. Discontinue IV fluids, IV nitroglycerin, and IV heparin. 2. Initiate gentle diuresis with furosemide 20 mg IV twice daily, for target net negative fluid balance of 2 L in 24 hours. 3. Switch metoprolol to carvedilol 3.125 mg twice daily. 4. Discontinue amlodipine and start lisinopril 2.5 mg daily, to be escalated as blood pressure and renal function allow. 5. Continue secondary prevention of coronary artery disease, including indefinite antiplatelet therapy with ticagrelor 60 mg twice daily. 6. Patient will likely need at least 1-2 more days of diuresis and optimization of goal-directed medical therapy for acute HFrEF secondary to stress-induced cardiomyopathy. Nelva Bush, MD [facility]   Exercise Tolerance Test  Result Date: 05/30/2020  Abnormal exercise tolerance test.  Baseline EKG demonstrates normal sinus rhythm with non-specific ST changed.  The patient demonstrates good exercise capacity with normal heart rate and blood pressure responses. No angina was reported.  There are 1.5-2 mm horizontal ST depressions in the inferolateral leads during peak stress.  Rare PVC's are noted during stress. However, frequent PVC's including ventricular couplets and bigeminy occurred during recovery; the patient was asymptomatic.  [facility] treadmill score = -3.  Abnormal, intermediate to high risk exercise tolerance test with 1.5-2 mm horizontal ST depressions at peak stress and frequent PVC's during recovery.   CT Angio Chest/Abd/Pel for Dissection W and/or Wo Contrast  Result Date: 05/31/2020 CLINICAL DATA:  Chest pain and back pain with abnormal stress test EXAM: CT ANGIOGRAPHY CHEST, ABDOMEN AND PELVIS TECHNIQUE: Non-contrast CT of the chest was initially obtained. Multidetector CT  imaging through the chest, abdomen and pelvis was performed using the standard protocol during bolus administration of intravenous contrast. Multiplanar reconstructed images and MIPs were obtained and reviewed to evaluate the vascular anatomy. CONTRAST:  137mL OMNIPAQUE IOHEXOL 350 MG/ML SOLN COMPARISON:  None. FINDINGS: CTA CHEST FINDINGS Cardiovascular: --Heart: The heart size is normal. There is a smallpericardial effusion. There are coronary artery calcifications. --Aorta: The course and caliber of the thoracic aorta are normal. There is mild aortic atherosclerotic calcification. Precontrast images show no aortic intramural hematoma. There is no blood pool, dissection or penetrating ulcer demonstrated on arterial phase postcontrast imaging. There is a conventional 3 vessel aortic arch branching pattern. The proximal arch vessels are widely patent. --Pulmonary Arteries: Contrast timing is optimized for preferential opacification of the aorta. Within that limitation, normal central pulmonary arteries. Mediastinum/Nodes: No mediastinal, hilar or axillary lymphadenopathy. The visualized thyroid and thoracic esophageal course are unremarkable. Lungs/Pleura: No pulmonary nodules or masses. No pleural effusion or pneumothorax. No focal airspace consolidation. No focal pleural abnormality. Musculoskeletal: No chest wall abnormality. No acute osseous findings. Review of the MIP images confirms the above findings. CTA ABDOMEN AND PELVIS FINDINGS VASCULAR Aorta: Normal caliber aorta without aneurysm, dissection, vasculitis or hemodynamically significant stenosis. There is mild aortic atherosclerosis. Celiac: There is severe stenosis of the proximal celiac axis with an angiographic string  sign (series 10, image 93). The distal vessel is patent. SMA: Widely patent without dissection or stenosis. Renals: Single renal arteries bilaterally. No aneurysm, dissection, stenosis or evidence of fibromuscular dysplasia. 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 There is moderate atherosclerosis of the common and external iliac arteries without high-grade stenosis. Veins: Normal course and caliber of the major veins. Assessment is otherwise limited by the arterial dominant contrast phase. Review of the MIP images confirms the above findings. NON-VASCULAR Hepatobiliary: Normal hepatic contours and density. No visible biliary dilatation. Normal gallbladder. Pancreas: Normal contours without ductal dilatation. No peripancreatic fluid collection. Spleen: Normal arterial phase splenic enhancement pattern. Adrenals/Urinary Tract: --Adrenal glands: Normal. --Right kidney/ureter: No hydronephrosis or perinephric stranding. No nephrolithiasis. No obstructing ureteral stones. --Left kidney/ureter: No hydronephrosis or perinephric stranding. No nephrolithiasis. No obstructing ureteral stones. --Urinary bladder: Unremarkable. Stomach/Bowel: --Stomach/Duodenum: No hiatal hernia or other gastric abnormality. Normal duodenal course and caliber. --Small bowel: No dilatation or inflammation. --Colon: No focal abnormality. --Appendix: Normal. Lymphatic:  No abdominal or pelvic lymphadenopathy. Reproductive: Normal uterus and ovaries. Musculoskeletal. No bony spinal canal stenosis or focal osseous abnormality. Other: None. Review of the MIP images confirms the above findings. IMPRESSION: 1. No acute aortic syndrome. 2. 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 The distal vessel is patent. 3. 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 4. Small pericardial effusion and coronary artery atherosclerosis. Aortic Atherosclerosis (ICD10-I70.0). Electronically Signed   By: Ulyses Jarred M.D.   On: 05/31/2020 00:26        Scheduled Meds: . acetaminophen      . [START ON 06/01/2020] aspirin  81 mg Oral Pre-Cath  . atorvastatin   80 mg Oral Daily  . carvedilol  3.125 mg Oral BID WC  . [START ON 06/01/2020] enoxaparin (LOVENOX) injection  40 mg Subcutaneous Q24H  . furosemide  20 mg Intravenous BID  . levothyroxine  75 mcg Oral QAC breakfast  . lisinopril  2.5 mg Oral Daily  . pantoprazole  40 mg Oral Daily  . sodium chloride flush  3 mL Intravenous Q12H  . sodium chloride flush  3 mL Intravenous Q12H  . ticagrelor  60 mg Oral BID   Continuous Infusions: . sodium chloride    . sodium chloride    . sodium chloride       LOS: 0 days    Time spent: 30 min    Desma Maxim, MD [facility] Pager [phone]  If 7PM-7AM, please contact night-coverage [email or web address] Password [facility] 05/31/2020, 4:34 PM

## 2020-05-31 NOTE — Progress Notes (Signed)
ANTICOAGULATION CONSULT NOTE - Initial Consult  Pharmacy Consult for Heparin Indication: ACS/STEMI  Allergies  Allergen Reactions   Humira [Adalimumab] Rash    Patient Measurements: Height: 5\' 6"  (167.6 cm) Weight: 63.5 kg (140 lb) IBW/kg (Calculated) : 59.3 HEPARIN DW (KG): 63.5  Vital Signs: Temp: 98.2 F (36.8 C) (09/14 2347) Temp Source: Oral (09/14 2347) BP: 118/75 (09/15 0030) Pulse Rate: 77 (09/15 0030)  Labs: Recent Labs    05/30/20 2348  HGB 12.8  HCT 36.9  PLT 407*  APTT 29  LABPROT 11.8  INR 0.9  CREATININE 0.87  TROPONINIHS 6,301*    Estimated Creatinine Clearance: 64.4 mL/min (by C-G formula based on SCr of 0.87 mg/dL).   Medical History: Past Medical History:  Diagnosis Date   CAD (coronary artery disease)    Hashimoto's disease    Hidradenitis suppurativa    Hydradenitis    Hyperparathyroidism ([place])    Hypertension    Myocardial infarction ([place])     Medications:  (Not in a hospital admission)   Assessment: Baseline labs WNL.  No anticoagulants PTA.  Goal of Therapy:  Heparin level 0.3-0.7 units/ml Monitor platelets by anticoagulation protocol: Yes   Plan:  Heparin bolus 4000 units x 1 then infusion at 900 units/hr Check HL in 6 hours  [place] Crane, Lailyn Appelbaum A 05/31/2020,12:57 AM

## 2020-05-31 NOTE — H&P (Signed)
[place]   PATIENT NAME: April Raymond    MR#:  [id number]  DATE OF BIRTH:  09-Feb-1961  DATE OF ADMISSION:  05/30/2020  PRIMARY CARE PHYSICIAN: Venita Lick, NP   REQUESTING/REFERRING PHYSICIAN: Gonzella Lex, MD  CHIEF COMPLAINT:   Chief Complaint  Patient presents with  . Chest Pain    HISTORY OF PRESENT ILLNESS:  April Raymond  is a 59 y.o. female with a known history of coronary artery disease status post MI status post PCI and stent in 2017, hypertension and hyperparathyroidism, who presented to the ER with acute onset of substernal chest pain felt as pressure and graded 6/10 in severity with no radiation.  She had associated dyspnea without palpitations.  No nausea or vomiting or abdominal pain.  No headache or dizziness or blurred vision.  No leg pain or edema recent travels or surgeries.  She was having associated upper back pain.  No fever or chills cough or wheezing or hemoptysis.  No bleeding diathesis.  When she came to the ED, blood pressure was 146/98 with otherwise normal vital signs.  Labs revealed unremarkable CMP.  Initial high-sensitivity troponin I was 6301 and later 5910.  CBC showed thrombocytosis.  EKG showed normal sinus rhythm with rate of 76 with anteroseptal Q waves.  The patient was given IV heparin bolus and drip, 4 mg of IV morphine sulfate, IV nitroglycerin drip, following of IV Zofran and lactated Ringer IV infusion.  Dr. And listed 5 about the patient.  We will plan for cardiac catheterization in a.m.  She was pain-free during my interview.  She will be admitted to a progressive unit bed for further evaluation and management. PAST MEDICAL HISTORY:   Past Medical History:  Diagnosis Date  . CAD (coronary artery disease)   . Hashimoto's disease   . Hidradenitis suppurativa   . Hydradenitis   . Hyperparathyroidism ([place])   . Hypertension   . Myocardial infarction [facility])     PAST SURGICAL HISTORY:   Past Surgical History:    Procedure Laterality Date  . BREAST BIOPSY Left 1993   neg  . CYST EXCISION  x2  . PARATHYROIDECTOMY    . TONSILLECTOMY      SOCIAL HISTORY:   Social History   Tobacco Use  . Smoking status: Former Smoker    Packs/day: 1.00    Years: 40.00    Pack years: 40.00    Types: Cigarettes    Quit date: 09/18/2015    Years since quitting: 4.7  . Smokeless tobacco: Never Used  Substance Use Topics  . Alcohol use: Yes    Comment: on occasion    FAMILY HISTORY:   Family History  Problem Relation Age of Onset  . Heart attack Mother 29  . COPD Mother   . Stroke Mother   . Diverticulitis Mother   . Cancer Mother        breast  . Hypertension Mother   . Breast cancer Mother 37  . Hypertension Father   . Hypertension Brother   . Stroke Maternal Grandfather   . Alzheimer's disease Paternal Grandmother   . Heart attack Paternal Grandfather   . Breast cancer Cousin        mat cousin    DRUG ALLERGIES:   Allergies  Allergen Reactions  . Humira [Adalimumab] Rash    REVIEW OF SYSTEMS:   ROS As per history of present illness. All pertinent systems were reviewed above. Constitutional, HEENT, cardiovascular, respiratory, GI, GU,  musculoskeletal, neuro, psychiatric, endocrine, integumentary and hematologic systems were reviewed and are otherwise negative/unremarkable except for positive findings mentioned above in the HPI.   MEDICATIONS AT HOME:   Prior to Admission medications   Medication Sig Start Date End Date Taking? Authorizing Provider  amLODipine (NORVASC) 2.5 MG tablet Take 1 tablet (2.5 mg total) by mouth daily. 11/05/19  Yes Cannady, Jolene T, NP  atorvastatin (LIPITOR) 20 MG tablet Take 1 tablet (20 mg total) by mouth daily at 6 PM. 11/05/19  Yes Cannady, Jolene T, NP  conjugated estrogens (PREMARIN) vaginal cream Place 0.5 grams twice weekly intravaginally. 04/24/20  Yes Cannady, Henrine Screws T, NP  levothyroxine (SYNTHROID) 75 MCG tablet 1 tablet daily 6 days per week    Yes [provider]  metoprolol tartrate (LOPRESSOR) 25 MG tablet Take 1 tablet (25 mg total) by mouth daily. 11/05/19  Yes Cannady, Jolene T, NP  pantoprazole (PROTONIX) 40 MG tablet Take 1 tablet (40 mg total) by mouth daily. 11/05/19  Yes Cannady, Henrine Screws T, NP  ticagrelor (BRILINTA) 60 MG TABS tablet Take 1 tablet (60 mg total) by mouth 2 (two) times daily. 11/05/19  Yes Cannady, Henrine Screws T, NP  levothyroxine (SYNTHROID) 75 MCG tablet Take 1 tablet (75 mcg total) by mouth daily before breakfast. 11/05/19   Cannady, Henrine Screws T, NP  nitroGLYCERIN (NITROSTAT) 0.4 MG SL tablet Place 1 tablet (0.4 mg total) under the tongue every 5 (five) minutes as needed for chest pain. 11/05/19 05/24/20  End, Harrell Gave, MD      VITAL SIGNS:  Blood pressure 91/68, pulse (!) 59, temperature 98.2 F (36.8 C), temperature source Oral, resp. rate 13, height 5\' 6"  (1.676 m), weight 63.5 kg, last menstrual period 11/16/2007, SpO2 93 %.  PHYSICAL EXAMINATION:  Physical Exam  GENERAL:  59 y.o.-year-old Caucasian female patient lying in the bed with no acute distress.  EYES: Pupils equal, round, reactive to light and accommodation. No scleral icterus. Extraocular muscles intact.  HEENT: Head atraumatic, normocephalic. Oropharynx and nasopharynx clear.  NECK:  Supple, no jugular venous distention. No thyroid enlargement, no tenderness.  LUNGS: Normal breath sounds bilaterally, no wheezing, rales,rhonchi or crepitation. No use of accessory muscles of respiration.  CARDIOVASCULAR: Regular rate and rhythm, S1, S2 normal. No murmurs, rubs, or gallops.  ABDOMEN: Soft, nondistended, nontender. Bowel sounds present. No organomegaly or mass.  EXTREMITIES: No pedal edema, cyanosis, or clubbing.  NEUROLOGIC: Cranial nerves II through XII are intact. Muscle strength 5/5 in all extremities. Sensation intact. Gait not checked.  PSYCHIATRIC: The patient is alert and oriented x 3.  Normal affect and good eye contact. SKIN: No  obvious rash, lesion, or ulcer.   LABORATORY PANEL:   CBC Recent Labs  Lab 05/31/20 0406  WBC 8.9  HGB 11.3*  HCT 33.0*  PLT 347   ------------------------------------------------------------------------------------------------------------------  Chemistries  Recent Labs  Lab 05/30/20 2348  NA 140  K 3.8  CL 105  CO2 25  GLUCOSE 103*  BUN 23*  CREATININE 0.87  CALCIUM 9.7  AST 33  ALT 20  ALKPHOS 34*  BILITOT 0.7   ------------------------------------------------------------------------------------------------------------------  Cardiac Enzymes No results for input(s): TROPONINI in the last 168 hours. ------------------------------------------------------------------------------------------------------------------  RADIOLOGY:  Exercise Tolerance Test  Result Date: 05/30/2020  Abnormal exercise tolerance test.  Baseline EKG demonstrates normal sinus rhythm with non-specific ST changed.  The patient demonstrates good exercise capacity with normal heart rate and blood pressure responses. No angina was reported.  There are 1.5-2 mm horizontal ST depressions in the inferolateral  leads during peak stress.  Rare PVC's are noted during stress. However, frequent PVC's including ventricular couplets and bigeminy occurred during recovery; the patient was asymptomatic.  [facility] treadmill score = -3.  Abnormal, intermediate to high risk exercise tolerance test with 1.5-2 mm horizontal ST depressions at peak stress and frequent PVC's during recovery.   CT Angio Chest/Abd/Pel for Dissection W and/or Wo Contrast  Result Date: 05/31/2020 CLINICAL DATA:  Chest pain and back pain with abnormal stress test EXAM: CT ANGIOGRAPHY CHEST, ABDOMEN AND PELVIS TECHNIQUE: Non-contrast CT of the chest was initially obtained. Multidetector CT imaging through the chest, abdomen and pelvis was performed using the standard protocol during bolus administration of intravenous contrast. Multiplanar  reconstructed images and MIPs were obtained and reviewed to evaluate the vascular anatomy. CONTRAST:  112mL OMNIPAQUE IOHEXOL 350 MG/ML SOLN COMPARISON:  None. FINDINGS: CTA CHEST FINDINGS Cardiovascular: --Heart: The heart size is normal. There is a smallpericardial effusion. There are coronary artery calcifications. --Aorta: The course and caliber of the thoracic aorta are normal. There is mild aortic atherosclerotic calcification. Precontrast images show no aortic intramural hematoma. There is no blood pool, dissection or penetrating ulcer demonstrated on arterial phase postcontrast imaging. There is a conventional 3 vessel aortic arch branching pattern. The proximal arch vessels are widely patent. --Pulmonary Arteries: Contrast timing is optimized for preferential opacification of the aorta. Within that limitation, normal central pulmonary arteries. Mediastinum/Nodes: No mediastinal, hilar or axillary lymphadenopathy. The visualized thyroid and thoracic esophageal course are unremarkable. Lungs/Pleura: No pulmonary nodules or masses. No pleural effusion or pneumothorax. No focal airspace consolidation. No focal pleural abnormality. Musculoskeletal: No chest wall abnormality. No acute osseous findings. Review of the MIP images confirms the above findings. CTA ABDOMEN AND PELVIS FINDINGS VASCULAR Aorta: Normal caliber aorta without aneurysm, dissection, vasculitis or hemodynamically significant stenosis. There is mild aortic atherosclerosis. 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 The distal vessel is patent. SMA: Widely patent without dissection or stenosis. Renals: Single renal arteries bilaterally. No aneurysm, dissection, stenosis or evidence of fibromuscular dysplasia. 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 There is moderate atherosclerosis of the common and  external iliac arteries without high-grade stenosis. Veins: Normal course and caliber of the major veins. Assessment is otherwise limited by the arterial dominant contrast phase. Review of the MIP images confirms the above findings. NON-VASCULAR Hepatobiliary: Normal hepatic contours and density. No visible biliary dilatation. Normal gallbladder. Pancreas: Normal contours without ductal dilatation. No peripancreatic fluid collection. Spleen: Normal arterial phase splenic enhancement pattern. Adrenals/Urinary Tract: --Adrenal glands: Normal. --Right kidney/ureter: No hydronephrosis or perinephric stranding. No nephrolithiasis. No obstructing ureteral stones. --Left kidney/ureter: No hydronephrosis or perinephric stranding. No nephrolithiasis. No obstructing ureteral stones. --Urinary bladder: Unremarkable. Stomach/Bowel: --Stomach/Duodenum: No hiatal hernia or other gastric abnormality. Normal duodenal course and caliber. --Small bowel: No dilatation or inflammation. --Colon: No focal abnormality. --Appendix: Normal. Lymphatic:  No abdominal or pelvic lymphadenopathy. Reproductive: Normal uterus and ovaries. Musculoskeletal. No bony spinal canal stenosis or focal osseous abnormality. Other: None. Review of the MIP images confirms the above findings. IMPRESSION: 1. No acute aortic syndrome. 2. 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 The distal vessel is patent. 3. 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 4. Small pericardial effusion and coronary artery atherosclerosis. Aortic Atherosclerosis (ICD10-I70.0). Electronically Signed   By: Ulyses Jarred M.D.   On: 05/31/2020 00:26  IMPRESSION AND PLAN:   1.  Acute Non-ST elevation myocardial infarction with history of coronary artery disease status post PCI and stent. -The patient will be sent to a progressive unit bed. -We will continue on IV heparin drip as well as nitroglycerin drip.   We will continue his Brilinta -We will place on aspirin high-dose statin.  We will add beta-blocker therapy -Cardiology consult and 2D echo will be obtained. -Dr. Saunders Revel is aware about the patient.  2.  Essential hypertension. -We will continue amlodipine and Lopressor.  3.  Dyslipidemia. -We will continue statin therapy.  4.  Hypothyroidism. -We will continue Synthroid and check TSH.  5.  GERD. -We will continue PPI therapy.  6.  DVT prophylaxis. -The patient will be on IV heparin.  All the records are reviewed and case discussed with ED provider. The plan of care was discussed in details with the patient (and family). I answered all questions. The patient agreed to proceed with the above mentioned plan. Further management will depend upon hospital course.   CODE STATUS: Full code  Status is: Inpatient  Remains inpatient appropriate because:Ongoing active pain requiring inpatient pain management, Ongoing diagnostic testing needed not appropriate for outpatient work up, Unsafe d/c plan, IV treatments appropriate due to intensity of illness or inability to take PO and Inpatient level of care appropriate due to severity of illness   Dispo: The patient is from: Home              Anticipated d/c is to: Home              Anticipated d/c date is: 2 days               Patient currently is not medically stable to d/c.    TOTAL TIME TAKING CARE OF THIS PATIENT: 55 minutes.    Christel Mormon M.D on 05/31/2020 at 4:57 AM  [facility]   From 7 PM-7 AM, contact night-coverage [email or web address]  CC: Primary care physician; Venita Lick, NP   Note: This dictation was prepared with Dragon dictation along with smaller phrase technology. Any transcriptional typo errors that result from this process are unintentional.

## 2020-05-31 NOTE — Progress Notes (Signed)
Pt ambulatory to the bathroom. Pt dropped her right arm while in the bathroom and site began to bleed. Pressure applied. New dressing applied. Bleeding has stopped.Pt instructed to keep her right arm across her chest even when ambulating.

## 2020-05-31 NOTE — Consult Note (Signed)
Cardiology Consultation:   Patient ID: April Raymond MRN: [id number]; DOB: 20-Jan-1961  Admit date: 05/30/2020 Date of Consult: 05/31/2020  Primary Care Provider: Venita Lick, NP [facility] Cardiologist: Nelva Bush, MD [facility] Electrophysiologist:  None    Patient Profile:   April Raymond is a 59 y.o. 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 Alfred Levins.  History of Present Illness:   April Raymond underwent exercise tolerance test in our office this afternoon for evaluation of intermittent chest tightness present only when active in the summer heat.  She was able to exercise today without any chest pain, though 1.5-2 mm inferorlateral ST depressions were noted during peak stress.  She also had frequent PVC's during recovery, though she remained asymptomatic the entire time.  I evaluated her in our office during recovery, at which time she was asymptomatic. I recommended that we proceed with outpatient cardiac catheterization.  She was noted to be hypertensive before, during, and after the stress test (up to 203/92).  During her drive home from the stress test, April Raymond began to experience chest and upper back pain.  She describes it as both an ache and sharp pain, with a maximum intensity of 7/10.  She took BASA x 4 and SL NTG x 2 with mild relief.  As the pain was primarily in her upper back, she deferred contacting EMS right away.  However, given continued pain this evening, her husband ultimately called 911.  When EMS arrived, there was question of subtle inferior ST elevation, for which April Raymond was given addition SL NTG x 3.  Currently, April Raymond  continues to have 5/10 pain that is primarily in her upper back between her shoulder blades, though it does radiate somewhat anteriorly.  She denies shortness of breath, palpitations, and lightheadedness.  She has been compliant with her medication, including ticagrelor that she has been maintained on since her MI/PCI in 2017.   Past Medical History:  Diagnosis Date   CAD (coronary artery disease)    Hashimoto's disease    Hidradenitis suppurativa    Hydradenitis    Hyperparathyroidism ([place])    Hypertension    Myocardial infarction [facility])     Past Surgical History:  Procedure Laterality Date   BREAST BIOPSY Left 1993   neg   CYST EXCISION  x2   PARATHYROIDECTOMY     TONSILLECTOMY       Home Medications:  Prior to Admission medications   Medication Sig Start Date Macallister Ashmead Date Taking? Authorizing Provider  amLODipine (NORVASC) 2.5 MG tablet Take 1 tablet (2.5 mg total) by mouth daily. 11/05/19   Cannady, Henrine Screws T, NP  atorvastatin (LIPITOR) 20 MG tablet Take 1 tablet (20 mg total) by mouth daily at 6 PM. 11/05/19   Cannady, Henrine Screws T, NP  conjugated estrogens (PREMARIN) vaginal cream Place 0.5 grams twice weekly intravaginally. 04/24/20   Marnee Guarneri T, NP  levothyroxine (SYNTHROID) 75 MCG tablet Take 1 tablet (75 mcg total) by mouth daily before breakfast. 11/05/19   Cannady, Henrine Screws T, NP  levothyroxine (SYNTHROID) 75 MCG tablet 1 tablet daily 6 days per week    [provider]  metoprolol tartrate (LOPRESSOR) 25 MG tablet Take 1 tablet (25 mg  total) by mouth daily. 11/05/19   Cannady, Henrine Screws T, NP  nitroGLYCERIN (NITROSTAT) 0.4 MG SL tablet Place 1 tablet (0.4 mg total) under the tongue every 5 (five) minutes as needed for chest pain. 11/05/19 05/24/20  Onie Hayashi, Harrell Gave, MD  pantoprazole (PROTONIX) 40 MG tablet Take 1 tablet (40 mg total) by mouth daily. 11/05/19   Cannady, Henrine Screws T, NP  ticagrelor (BRILINTA) 60 MG TABS tablet Take 1 tablet (60 mg total) by mouth 2 (two)  times daily. 11/05/19   Venita Lick, NP    Inpatient Medications: Scheduled Meds:  Continuous Infusions:  PRN Meds:   Allergies:    Allergies  Allergen Reactions   Humira [Adalimumab] Rash    Social History:   Social History   Tobacco Use   Smoking status: Former Smoker    Packs/day: 1.00    Years: 40.00    Pack years: 40.00    Types: Cigarettes    Quit date: 09/18/2015    Years since quitting: 4.7   Smokeless tobacco: Never Used  Vaping Use   Vaping Use: Never used  Substance Use Topics   Alcohol use: Yes    Comment: on occasion   Drug use: Never     Family History:   Family History  Problem Relation Age of Onset   Heart attack Mother 32   COPD Mother    Stroke Mother    Diverticulitis Mother    Cancer Mother        breast   Hypertension Mother    Breast cancer Mother 69   Hypertension Father    Hypertension Brother    Stroke Maternal Grandfather    Alzheimer's disease Paternal Grandmother    Heart attack Paternal Grandfather    Breast cancer Cousin        mat cousin     ROS:  Please see the history of present illness. All other ROS reviewed and negative.     Physical Exam/Data:   Vitals:   05/30/20 2347 05/30/20 2356  BP: (!) 146/98   Pulse: 84   Resp: 11   Temp: 98.2 F (36.8 C)   TempSrc: Oral   SpO2: 98%   Weight:  63.5 kg  Height:  5\' 6"  (1.676 m)   No intake or output data in the 24 hours ending 05/31/20 0011 Last 3 Weights 05/30/2020 05/24/2020 05/19/2020  Weight (lbs) 140 lb 144 lb 6 oz 146 lb  Weight (kg) 63.504 kg 65.488 kg 66.225 kg     Body mass index is 22.6 kg/m.  General:  Well nourished, well developed, in no acute distress HEENT: normal Lymph: no adenopathy Neck: no JVD Endocrine:  No thryomegaly Vascular: No carotid bruits; FA pulses 2+ bilaterally without bruits  Cardiac:  normal S1, S2; RRR; no murmur  Lungs:  clear to auscultation bilaterally, no wheezing, rhonchi or rales  Abd: soft,  nontender, no hepatomegaly  Ext: no edema Musculoskeletal:  No deformities, BUE and BLE strength normal and equal Skin: warm and dry  Neuro:  CNs 2-12 intact, no focal abnormalities noted Psych:  Normal affect   EKG:  The EKG was personally reviewed and demonstrates:  Normal sinus rhythm with non-specific ST segment changes and poor R wave progression in V1-V3.  Current EKG and EMS EKG's available for review do not meet STEMI criteria. Telemetry:  Telemetry was personally reviewed and demonstrates:  NSR  Relevant CV Studies: GXT (05/30/2020): Abnormal, intermediate to high risk exercise tolerance test with 1.5-2 mm horizontal ST depressions  at peak stress and frequent PVC's during recovery.  LHC/PCI (10/15/2015, [place]): LMCA normal. LAD with discrete 60% mid vessel stenosis. LCx normal. RCA with 100% occlusion. LVEF 55% with inferior hypokinesis. Successful PCI to RCA with overlapping Synergy 2.5 x 12, 2.25 x 12, and 2.25 x 32 mm drug-eluting stents. Patient also required temporary transvenous pacer and intra-aortic balloon pump.   Laboratory Data:  High Sensitivity Troponin:  No results for input(s): TROPONINIHS in the last 720 hours.   ChemistryNo results for input(s): NA, K, CL, CO2, GLUCOSE, BUN, CREATININE, CALCIUM, GFRNONAA, GFRAA, ANIONGAP in the last 168 hours.  No results for input(s): PROT, ALBUMIN, AST, ALT, ALKPHOS, BILITOT in the last 168 hours. Hematology Recent Labs  Lab 05/30/20 2348  WBC 9.7  RBC 3.94  HGB 12.8  HCT 36.9  MCV 93.7  MCH 32.5  MCHC 34.7  RDW 14.0  PLT 407*   BNPNo results for input(s): BNP, PROBNP in the last 168 hours.  DDimer No results for input(s): DDIMER in the last 168 hours.   Radiology/Studies:  Exercise Tolerance Test  Result Date: 05/30/2020  Abnormal exercise tolerance test.  Baseline EKG demonstrates normal sinus rhythm with non-specific ST changed.  The patient demonstrates good  exercise capacity with normal heart rate and blood pressure responses. No angina was reported.  There are 1.5-2 mm horizontal ST depressions in the inferolateral leads during peak stress.  Rare PVC's are noted during stress. However, frequent PVC's including ventricular couplets and bigeminy occurred during recovery; the patient was asymptomatic.  [facility] treadmill score = -3.  Abnormal, intermediate to high risk exercise tolerance test with 1.5-2 mm horizontal ST depressions at peak stress and frequent PVC's during recovery.   TIMI Risk Score for Unstable Angina or Non-ST Elevation MI:   The patient's TIMI risk score is 2, which indicates a 8% risk of all cause mortality, new or recurrent myocardial infarction or need for urgent revascularization in the next 14 days.   Assessment and Plan:   Chest pain: The patient's history is most concerning for coronary insufficiency (unstable angina), given remote MI s/p PCI and exertional CP in the heat this summer.  GXT yesterday (05/30/2020) showed inferolateral ST depressions during peak stress with frequent PVC's during recovery (albeit asymptomatic).  The patient was also hypertensive during the test.  She developed upper back and chest pain after departing from the stress test, which is notably different than the angina she experienced at the time of her STEMI in 2017.  Her EKG's tonight do not meet STEMI criteria.  Given main component of upper back pain as well as elevated blood pressure during stress test, I am primarily concerned about the possibility of aortic dissection.  I have spoken with April Raymond, her husband, and Dr. Alfred Levins and have recommended a STAT CTA of the chest.  If this demonstrates aortic dissection, emergent transfer to a tertiary care center with on-site cardiac surgery will need to be undertaken.  If CTA is negative for dissection, I favor aggressive medical therapy for unstable angina and serial EKG's and cardiac biomarkers.  I do not  think that emergent cardiac catheterization is indicated at this time, given concern for aortic dissection and lack of diagnostic ST elevation by EKG.  If April Raymond has refractory angina or dynamic EKG changes, urgent LHC will need to be reconsidered overnight.  CTA C/A/P to rule out dissection.  If CTA negative, recommend IV heparin and IV NTG for chest pain relief.  Trend troponin and repeat EKG in ~15 minutes or if chest pain worsens.  Plan for LHC (assuming no aortic dissection) in the AM, sooner unable to resolve chest pain with medical therapy.  ASA 81 mg daily.  Defer restarting ticagrelor pending CTA +/- catheterization.  Hypertension: BP mildly elevated at this time, with the patient having received multiple doses of SL NTG.  Continue metoprolol.  IV NTG for chest pain relief and BP, as above.  Defer restarting amlodipine at this time.  Hyperlipidemia:  Continue atorvastatin.  For questions or updates, please contact [place] Please consult [email or web address] for contact info under [facility].  Signed, Nelva Bush, MD  05/31/2020 12:11 AM

## 2020-05-31 NOTE — H&P (View-Only) (Signed)
[facility]  Date: 05/31/20  Time: 1:52 AM  Patient reassessed following CTA of the chest and medical therapy for chest/back pain.  CTA was negative for aortic dissection.  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  Patient reports significant improvement in symptoms: chest pain has resolved and back pain is down to 1/10 with IV NTG and morphine.  Repeat EKG is stable with non-specific ST changes; EKG does not meet STEMI criteria.  We have agreed to continue with medical therapy, including IV heparin and nitroglycerin.  We will plan to proceed with cardiac catheterization and possible PCI in the morning.  This will allow for IV hydration and washout of contrast given for CTA.  I have reviewed the risks, indications, and alternatives to cardiac catheterization, possible angioplasty, and stenting with the patient. Risks include but are not limited to bleeding, infection, vascular injury, stroke, myocardial infection, arrhythmia, kidney injury, radiation-related injury in the case of prolonged fluoroscopy use, emergency cardiac surgery, and death. The patient understands the risks of serious complication is 1-2 in 5391 with diagnostic cardiac cath and 1-2% or less with angioplasty/stenting.  Nelva Bush, MD [facility]

## 2020-05-31 NOTE — Progress Notes (Signed)
[facility]  Date: 05/31/20  Time: 1:52 AM  Patient reassessed following CTA of the chest and medical therapy for chest/back pain.  CTA was negative for aortic dissection.  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  Patient reports significant improvement in symptoms: chest pain has resolved and back pain is down to 1/10 with IV NTG and morphine.  Repeat EKG is stable with non-specific ST changes; EKG does not meet STEMI criteria.  We have agreed to continue with medical therapy, including IV heparin and nitroglycerin.  We will plan to proceed with cardiac catheterization and possible PCI in the morning.  This will allow for IV hydration and washout of contrast given for CTA.  I have reviewed the risks, indications, and alternatives to cardiac catheterization, possible angioplasty, and stenting with the patient. Risks include but are not limited to bleeding, infection, vascular injury, stroke, myocardial infection, arrhythmia, kidney injury, radiation-related injury in the case of prolonged fluoroscopy use, emergency cardiac surgery, and death. The patient understands the risks of serious complication is 1-2 in 2902 with diagnostic cardiac cath and 1-2% or less with angioplasty/stenting.  Nelva Bush, MD [facility]

## 2020-05-31 NOTE — ED Provider Notes (Signed)
[facility] Department Provider Note  ____________________________________________  Time seen: Approximately 12:06 AM  I have reviewed the triage vital signs and the nursing notes.   HISTORY  Chief Complaint Chest Pain   HPI April Raymond is a 59 y.o. female with a history of CAD status post MI, stents on Brilinta, Hashimoto's disease, hypertension who presents via EMS as an emergency traffic for chest pain.  Patient had a heart attack in 2017.  She is a patient of Dr. Saunders Revel  and saw him in the office today for chest pain.  Patient reports that over the last several weeks she has been having intermittent episodes of chest pain mostly when she is out in the heat that she describes as tightness in her chest.  Therefore she underwent a stress test today which according to her was abnormal.  Right after leaving the cardiologist office patient started having chest pain.  The pain was tightness in the center of her chest, 7 in intensity and constant for most of the afternoon and evening.  Later in the evening the pain started migrating to her back which made patient more concerned and made her call 911.  Code STEMI was activated in the field.  Patient was given 3 nitros and arrived with a 5 out of 10 pain that is now mostly on her back between her shoulder blades.  Patient was extremely hypertensive per EMS.  No diaphoresis, no shortness of breath, no nausea or vomiting, no paresthesias/weakness/numbness of her extremities, no abdominal pain.  Past Medical History:  Diagnosis Date  . CAD (coronary artery disease)   . Hashimoto's disease   . Hidradenitis suppurativa   . Hydradenitis   . Hyperparathyroidism ([place])   . Hypertension   . Myocardial infarction [facility])     Patient Active Problem List   Diagnosis Date Noted  . NSTEMI (non-ST elevated myocardial infarction) ([place]) 05/31/2020  . Mass of skin of back 11/05/2019  . Former smoker 09/30/2019  . History of  MI (myocardial infarction) 09/30/2019  . History of parathyroidectomy 09/30/2019  . CAD in native artery 09/30/2019  . Essential hypertension 09/30/2019  . Hyperlipidemia LDL goal <70 09/30/2019  . Hashimoto's disease 09/30/2019  . Vaginal atrophy 09/30/2019  . Hidradenitis suppurativa 09/30/2019    Past Surgical History:  Procedure Laterality Date  . BREAST BIOPSY Left 1993   neg  . CYST EXCISION  x2  . PARATHYROIDECTOMY    . TONSILLECTOMY      Prior to Admission medications   Medication Sig Start Date End Date Taking? Authorizing Provider  amLODipine (NORVASC) 2.5 MG tablet Take 1 tablet (2.5 mg total) by mouth daily. 11/05/19  Yes Cannady, Jolene T, NP  atorvastatin (LIPITOR) 20 MG tablet Take 1 tablet (20 mg total) by mouth daily at 6 PM. 11/05/19  Yes Cannady, Jolene T, NP  conjugated estrogens (PREMARIN) vaginal cream Place 0.5 grams twice weekly intravaginally. 04/24/20  Yes Cannady, Henrine Screws T, NP  levothyroxine (SYNTHROID) 75 MCG tablet 1 tablet daily 6 days per week   Yes [provider]  metoprolol tartrate (LOPRESSOR) 25 MG tablet Take 1 tablet (25 mg total) by mouth daily. 11/05/19  Yes Cannady, Jolene T, NP  pantoprazole (PROTONIX) 40 MG tablet Take 1 tablet (40 mg total) by mouth daily. 11/05/19  Yes Cannady, Henrine Screws T, NP  ticagrelor (BRILINTA) 60 MG TABS tablet Take 1 tablet (60 mg total) by mouth 2 (two) times daily. 11/05/19  Yes Venita Lick, NP  levothyroxine (SYNTHROID) 75 MCG tablet Take 1 tablet (75 mcg total) by mouth daily before breakfast. 11/05/19   Cannady, Henrine Screws T, NP  nitroGLYCERIN (NITROSTAT) 0.4 MG SL tablet Place 1 tablet (0.4 mg total) under the tongue every 5 (five) minutes as needed for chest pain. 11/05/19 05/24/20  End, Harrell Gave, MD    Allergies Humira [adalimumab]  Family History  Problem Relation Age of Onset  . Heart attack Mother 58  . COPD Mother   . Stroke Mother   . Diverticulitis Mother   . Cancer Mother        breast  .  Hypertension Mother   . Breast cancer Mother 52  . Hypertension Father   . Hypertension Brother   . Stroke Maternal Grandfather   . Alzheimer's disease Paternal Grandmother   . Heart attack Paternal Grandfather   . Breast cancer Cousin        mat cousin    Social History Social History   Tobacco Use  . Smoking status: Former Smoker    Packs/day: 1.00    Years: 40.00    Pack years: 40.00    Types: Cigarettes    Quit date: 09/18/2015    Years since quitting: 4.7  . Smokeless tobacco: Never Used  Vaping Use  . Vaping Use: Never used  Substance Use Topics  . Alcohol use: Yes    Comment: on occasion  . Drug use: Never    Review of Systems  Constitutional: Negative for fever. Eyes: Negative for visual changes. ENT: Negative for sore throat. Neck: No neck pain  Cardiovascular: + chest pain. Respiratory: Negative for shortness of breath. Gastrointestinal: Negative for abdominal pain, vomiting or diarrhea. Genitourinary: Negative for dysuria. Musculoskeletal: + upper back pain. Skin: Negative for rash. Neurological: Negative for headaches, weakness or numbness. Psych: No SI or HI  ____________________________________________   PHYSICAL EXAM:  VITAL SIGNS: ED Triage Vitals  Enc Vitals Group     BP 05/30/20 2347 (!) 146/98     Pulse Rate 05/30/20 2347 84     Resp 05/30/20 2347 11     Temp 05/30/20 2347 98.2 F (36.8 C)     Temp Source 05/30/20 2347 Oral     SpO2 05/30/20 2347 98 %     Weight 05/30/20 2356 140 lb (63.5 kg)     Height 05/30/20 2356 5\' 6"  (1.676 m)     Head Circumference --      Peak Flow --      Pain Score 05/30/20 2356 6     Pain Loc --      Pain Edu? --      Excl. in [place]? --     Constitutional: Alert and oriented. Well appearing and in no apparent distress. HEENT:      Head: Normocephalic and atraumatic.         Eyes: Conjunctivae are normal. Sclera is non-icteric.       Mouth/Throat: Mucous membranes are moist.       Neck: Supple with no  signs of meningismus. Cardiovascular: Regular rate and rhythm. No murmurs, gallops, or rubs. 2+ symmetrical distal pulses are present in all extremities. No JVD. Respiratory: Normal respiratory effort. Lungs are clear to auscultation bilaterally.  Gastrointestinal: Soft, non tender, and non distended. Musculoskeletal: No edema, cyanosis, or erythema of extremities. Neurologic: Normal speech and language. Face is symmetric. Moving all extremities. No gross focal neurologic deficits are appreciated. Skin: Skin is warm, dry and intact. No rash noted. Psychiatric: Mood and affect are  normal. Speech and behavior are normal.  ____________________________________________   LABS (all labs ordered are listed, but only abnormal results are displayed)  Labs Reviewed  CBC WITH DIFFERENTIAL/PLATELET - Abnormal; Notable for the following components:      Result Value   Platelets 407 (*)    All other components within normal limits  COMPREHENSIVE METABOLIC PANEL - Abnormal; Notable for the following components:   Glucose, Bld 103 (*)    BUN 23 (*)    Alkaline Phosphatase 34 (*)    All other components within normal limits  TROPONIN I (HIGH SENSITIVITY) - Abnormal; Notable for the following components:   Troponin I (High Sensitivity) 6,301 (*)    All other components within normal limits  SARS CORONAVIRUS 2 BY RT PCR (HOSPITAL ORDER, [place] [facility])  LIPASE, BLOOD  PROTIME-INR  APTT  HEPARIN LEVEL (UNFRACTIONATED)  BASIC METABOLIC PANEL  CBC  TROPONIN I (HIGH SENSITIVITY)   ____________________________________________  EKG  ED ECG REPORT I, Rudene Re, the attending physician, personally viewed and interpreted this ECG.  Normal sinus rhythm, rate of 81, normal intervals, normal axis, minimal elevations in inferior leads with no reciprocal changes.  Unchanged from most recent EKG a week ago. ____________________________________________  RADIOLOGY  I have  personally reviewed the images performed during this visit and I agree with the Radiologist's read.   Interpretation by Radiologist:  Exercise Tolerance Test  Result Date: 05/30/2020  Abnormal exercise tolerance test.  Baseline EKG demonstrates normal sinus rhythm with non-specific ST changed.  The patient demonstrates good exercise capacity with normal heart rate and blood pressure responses. No angina was reported.  There are 1.5-2 mm horizontal ST depressions in the inferolateral leads during peak stress.  Rare PVC's are noted during stress. However, frequent PVC's including ventricular couplets and bigeminy occurred during recovery; the patient was asymptomatic.  [facility] treadmill score = -3.  Abnormal, intermediate to high risk exercise tolerance test with 1.5-2 mm horizontal ST depressions at peak stress and frequent PVC's during recovery.   CT Angio Chest/Abd/Pel for Dissection W and/or Wo Contrast  Result Date: 05/31/2020 CLINICAL DATA:  Chest pain and back pain with abnormal stress test EXAM: CT ANGIOGRAPHY CHEST, ABDOMEN AND PELVIS TECHNIQUE: Non-contrast CT of the chest was initially obtained. Multidetector CT imaging through the chest, abdomen and pelvis was performed using the standard protocol during bolus administration of intravenous contrast. Multiplanar reconstructed images and MIPs were obtained and reviewed to evaluate the vascular anatomy. CONTRAST:  148mL OMNIPAQUE IOHEXOL 350 MG/ML SOLN COMPARISON:  None. FINDINGS: CTA CHEST FINDINGS Cardiovascular: --Heart: The heart size is normal. There is a smallpericardial effusion. There are coronary artery calcifications. --Aorta: The course and caliber of the thoracic aorta are normal. There is mild aortic atherosclerotic calcification. Precontrast images show no aortic intramural hematoma. There is no blood pool, dissection or penetrating ulcer demonstrated on arterial phase postcontrast imaging. There is a conventional 3 vessel aortic  arch branching pattern. The proximal arch vessels are widely patent. --Pulmonary Arteries: Contrast timing is optimized for preferential opacification of the aorta. Within that limitation, normal central pulmonary arteries. Mediastinum/Nodes: No mediastinal, hilar or axillary lymphadenopathy. The visualized thyroid and thoracic esophageal course are unremarkable. Lungs/Pleura: No pulmonary nodules or masses. No pleural effusion or pneumothorax. No focal airspace consolidation. No focal pleural abnormality. Musculoskeletal: No chest wall abnormality. No acute osseous findings. Review of the MIP images confirms the above findings. CTA ABDOMEN AND PELVIS FINDINGS VASCULAR Aorta: Normal caliber aorta without aneurysm, dissection,  vasculitis or hemodynamically significant stenosis. There is mild aortic atherosclerosis. 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 The distal vessel is patent. SMA: Widely patent without dissection or stenosis. Renals: Single renal arteries bilaterally. No aneurysm, dissection, stenosis or evidence of fibromuscular dysplasia. 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 There is moderate atherosclerosis of the common and external iliac arteries without high-grade stenosis. Veins: Normal course and caliber of the major veins. Assessment is otherwise limited by the arterial dominant contrast phase. Review of the MIP images confirms the above findings. NON-VASCULAR Hepatobiliary: Normal hepatic contours and density. No visible biliary dilatation. Normal gallbladder. Pancreas: Normal contours without ductal dilatation. No peripancreatic fluid collection. Spleen: Normal arterial phase splenic enhancement pattern. Adrenals/Urinary Tract: --Adrenal glands: Normal. --Right kidney/ureter: No hydronephrosis or perinephric stranding. No nephrolithiasis. No  obstructing ureteral stones. --Left kidney/ureter: No hydronephrosis or perinephric stranding. No nephrolithiasis. No obstructing ureteral stones. --Urinary bladder: Unremarkable. Stomach/Bowel: --Stomach/Duodenum: No hiatal hernia or other gastric abnormality. Normal duodenal course and caliber. --Small bowel: No dilatation or inflammation. --Colon: No focal abnormality. --Appendix: Normal. Lymphatic:  No abdominal or pelvic lymphadenopathy. Reproductive: Normal uterus and ovaries. Musculoskeletal. No bony spinal canal stenosis or focal osseous abnormality. Other: None. Review of the MIP images confirms the above findings. IMPRESSION: 1. No acute aortic syndrome. 2. 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 The distal vessel is patent. 3. 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 4. Small pericardial effusion and coronary artery atherosclerosis. Aortic Atherosclerosis (ICD10-I70.0). Electronically Signed   By: Ulyses Jarred M.D.   On: 05/31/2020 00:26     ____________________________________________   PROCEDURES  Procedure(s) performed:yes Procedures Critical Care performed: yes  CRITICAL CARE Performed by: Rudene Re  ?  Total critical care time: 45 min  Critical care time was exclusive of separately billable procedures and treating other patients.  Critical care was necessary to treat or prevent imminent or life-threatening deterioration.  Critical care was time spent personally by me on the following activities: development of treatment plan with patient and/or surrogate as well as nursing, discussions with consultants, evaluation of patient's response to treatment, examination of patient, obtaining history from patient or surrogate, ordering and performing treatments and interventions, ordering and review of laboratory studies, ordering and review of radiographic studies, pulse oximetry and re-evaluation of  patient's condition.  ____________________________________________   INITIAL IMPRESSION / ASSESSMENT AND PLAN / ED COURSE  59 y.o. female with a history of CAD status post MI, stents on Brilinta, Hashimoto's disease, hypertension who presents via EMS as an emergency traffic for chest pain.  Patient arrives with several hours of constant chest pain which now migrated to the upper back.  She was made a code STEMI in the field.  EKG is borderline and does not really meet STEMI criteria.  Repeat EKG in the emergency room is identical to prior EKG done a week ago at the cardiologist office.  Patient was evaluated immediately upon arrival by myself and cardiologist on-call for STEMI Dr. Saunders Revel, who is also patient's cardiologist.  He also agrees that the EKG does not meet STEMI criteria.  Since patient was severely hypertensive per EMS and pain radiates to the back patient was taken straight to CT for evaluation of possible dissection is pending.  Patient was given IV morphine and Zofran for symptom relief.  Labs are pending. Old medical records reviewed.  History gathered from patient and her husband who is at bedside, plan discussed with both of them. Patient placed on telemetry for close monitoring.   _________________________ 12:43 AM on 05/31/2020 -----------------------------------------  CTA negative for PE or dissection. Patient's pain improved to a 3 out of 10 after morphine but still having discomfort.  The troponin is elevated at 6300. Dr. Saunders Revel in the room reassessing patient recommended initiating IV nitro and IV heparin. He will reassess patient in 1hr and if the pain is persistent, he plans to take her to cath lab.     _________________________ 1:59 AM on 05/31/2020 -----------------------------------------  Patient is pain free on nitro and heparin drip. Plan for LHC in the morning with Dr. Saunders Revel. Admitted to Hospitalist.  _____________________________________________ Please note:  Patient  was evaluated in Emergency Department today for the symptoms described in the history of present illness. Patient was evaluated in the context of the global COVID-19 pandemic, which necessitated consideration that the patient might be at risk for infection with the SARS-CoV-2 virus that causes COVID-19. Institutional protocols and algorithms that pertain to the evaluation of patients at risk for COVID-19 are in a state of rapid change based on information released by regulatory bodies including the CDC and federal and state organizations. These policies and algorithms were followed during the patient's care in the ED.  Some ED evaluations and interventions may be delayed as a result of limited staffing during the pandemic.   [place] Controlled Substance Database was reviewed by me. ____________________________________________   FINAL CLINICAL IMPRESSION(S) / ED DIAGNOSES   Final diagnoses:  NSTEMI (non-ST elevated myocardial infarction) ([place])      NEW MEDICATIONS STARTED DURING THIS VISIT:  ED Discharge Orders    None       Note:  This document was prepared using Dragon voice recognition software and may include unintentional dictation errors.    Alfred Levins, [place], MD 05/31/20 [phone]

## 2020-05-31 NOTE — Brief Op Note (Signed)
BRIEF CARDIAC CATHETERIZATION NOTE  05/31/2020  9:41 AM  PATIENT:  April Raymond  59 y.o. female  PRE-OPERATIVE DIAGNOSIS:  NSTEMI  POST-OPERATIVE DIAGNOSIS:  Stress induced cardiomyopathy  PROCEDURE:  Procedure(s): LEFT HEART CATH AND CORONARY ANGIOGRAPHY (N/A)  SURGEON:  Surgeon(s) and Role:    * Juanice Warburton, MD - Primary  FINDINGS: 1. Non-obstructive CAD. 2. Widely patent overlapping RCA stents. 3. Severely reduced LVEF (~30%) with mid and apical hypo-/kinesis and vigorous basal contraction.  Findings are consistent with stress-induced cardiomyopathy. 4. Moderately elevated left ventricular filling pressure.  RECOMMENDATIONS: 1. Discontinue IV fluids, heparin, and NTG. 2. Switch metoprolol to carvedilol 3.125 mg BID. 3. Add lisinopril 2.5 mg daily in place of amlodipine. 4. Diurese; will start furosemide 20 mg IV BID.  Nelva Bush, MD [facility]

## 2020-05-31 NOTE — Interval H&P Note (Signed)
History and Physical Interval Note:  05/31/2020 8:17 AM  April Raymond  has presented today for surgery, with the diagnosis of NSTEMI.  The various methods of treatment have been discussed with the patient and family. After consideration of risks, benefits and other options for treatment, the patient has consented to  Procedure(s): LEFT HEART CATH AND CORONARY ANGIOGRAPHY (N/A) as a surgical intervention.  The patient's history has been reviewed, patient examined, no change in status, stable for surgery.  I have reviewed the patient's chart and labs.  Questions were answered to the patient's satisfaction.    Cath Lab Visit (complete for each Cath Lab visit)  Clinical Evaluation Leading to the Procedure:   ACS: Yes.    Non-ACS:  N/A  [name]

## 2020-06-01 ENCOUNTER — Encounter: Payer: Self-pay | Admitting: Internal Medicine

## 2020-06-01 DIAGNOSIS — I739 Peripheral vascular disease, unspecified: Secondary | ICD-10-CM

## 2020-06-01 LAB — CBC
HCT: 35.4 % — ABNORMAL LOW (ref 36.0–46.0)
Hemoglobin: 12.3 g/dL (ref 12.0–15.0)
MCH: 32.9 pg (ref 26.0–34.0)
MCHC: 34.7 g/dL (ref 30.0–36.0)
MCV: 94.7 fL (ref 80.0–100.0)
Platelets: 353 10*3/uL (ref 150–400)
RBC: 3.74 MIL/uL — ABNORMAL LOW (ref 3.87–5.11)
RDW: 14.5 % (ref 11.5–15.5)
WBC: 7.1 10*3/uL (ref 4.0–10.5)
nRBC: 0 % (ref 0.0–0.2)

## 2020-06-01 LAB — BASIC METABOLIC PANEL
Anion gap: 8 (ref 5–15)
BUN: 20 mg/dL (ref 6–20)
CO2: 26 mmol/L (ref 22–32)
Calcium: 8.6 mg/dL — ABNORMAL LOW (ref 8.9–10.3)
Chloride: 105 mmol/L (ref 98–111)
Creatinine, Ser: 0.88 mg/dL (ref 0.44–1.00)
GFR calc Af Amer: >60 mL/min (ref >60)
GFR calc non Af Amer: >60 mL/min (ref >60)
Glucose, Bld: 92 mg/dL (ref 70–99)
Potassium: 3.6 mmol/L (ref 3.5–5.1)
Sodium: 139 mmol/L (ref 135–145)

## 2020-06-01 LAB — ECHOCARDIOGRAM COMPLETE
Area-P 1/2: 2.8 cm2
Height: 66 in
S' Lateral: 3.06 cm
Weight: 2273.6 oz

## 2020-06-01 MED ORDER — METOPROLOL SUCCINATE ER 25 MG PO TB24: 12.5000 mg | ORAL_TABLET | Freq: Every day | ORAL | Status: DC

## 2020-06-01 MED ORDER — ATORVASTATIN CALCIUM 80 MG PO TABS
80.0000 mg | ORAL_TABLET | Freq: Every day | ORAL | 1 refills | Status: DC
Start: 2020-06-02 — End: 2020-06-20

## 2020-06-01 MED ORDER — ASPIRIN 81 MG PO CHEW: 81.0000 mg | CHEWABLE_TABLET | ORAL | 1 refills | Status: DC

## 2020-06-01 MED ORDER — LOSARTAN POTASSIUM 25 MG PO TABS
12.5000 mg | ORAL_TABLET | Freq: Every day | ORAL | 1 refills | Status: DC
Start: 2020-06-02 — End: 2020-06-20

## 2020-06-01 MED ORDER — FUROSEMIDE 20 MG PO TABS: 20.0000 mg | ORAL_TABLET | Freq: Every day | ORAL | Status: DC

## 2020-06-01 MED ORDER — LOSARTAN POTASSIUM 25 MG PO TABS: 12.5000 mg | ORAL_TABLET | Freq: Every day | ORAL | Status: DC

## 2020-06-01 MED ORDER — FUROSEMIDE 20 MG PO TABS
20.0000 mg | ORAL_TABLET | Freq: Every day | ORAL | 1 refills | Status: DC
Start: 2020-06-02 — End: 2020-06-08

## 2020-06-01 MED ORDER — METOPROLOL SUCCINATE ER 25 MG PO TB24
12.5000 mg | ORAL_TABLET | Freq: Every day | ORAL | 1 refills | Status: DC
Start: 2020-06-02 — End: 2020-06-20

## 2020-06-01 NOTE — Progress Notes (Signed)
PROGRESS NOTE    April Raymond  EHU:[id number] DOB: 1961/01/23 DOA: 05/30/2020 PCP: Venita Lick, NP  Outpatient Specialists: [facility] cardiology    Brief Narrative:  Presented 9/14 with chest pain. Had had an abnormal exercise stress test earlier in the day. Admitted overnight with nstemi troponin 5-6,000. Cardiac cath 9/15 PM showing non-obstructive CAD, etiology thought to be stress-induced cardiomyopathy.    Assessment & Plan:   Principal Problem:   Stress-induced cardiomyopathy Active Problems:   History of MI (myocardial infarction)   CAD in native artery   Essential hypertension   Hashimoto's disease  # stress-induced cardiomyopathy - non-obstructive CAD on cath 9/15. Currently asymptomatic. - per cardiology have stopped heparin gtt and nitroglycerine, substituting lisinopril for amlodipine, starting coreg and lasix and brilinta - f/u TTE, performed yesterday, read pending - d/c per cardiology  # HTN - here bp stable, on the low side of normal, pt asymptomatic - cont lisinopril (bmp stable this AM)  # Hashimoto's - tsh wnl, cont synthyroid   DVT prophylaxis: lovenox  Code Status: full Family Communication: husband at bedside 9/15 and updated then Disposition Plan: home, timing per cardiology, possibly today or tomorrow   Consultants:   cardiology  Procedures:   Left heart cath   Antimicrobials:  none    Subjective: Feeling well. No chest pain or dyspnea. No abd pain. No lightheadedness. No vomiting or diarrhea.  Objective: Vitals:   05/31/20 1545 05/31/20 2007 05/31/20 2258 06/01/20 0504  BP: 117/61 (!) 90/57 (!) 102/55 (!) 103/51  Pulse: 75 80 77 75  Resp: 18 16 17 20   Temp: 98.5 F (36.9 C) 99.2 F (37.3 C) 97.6 F (36.4 C) 97.9 F (36.6 C)  TempSrc: Oral Oral Oral Oral  SpO2: 98% 94% 96% 96%  Weight: 64.5 kg   64.5 kg  Height: 5\' 6"  (1.676 m)       Intake/Output Summary (Last 24 hours) at 06/01/2020 0843 Last data filed at  06/01/2020 0547 Gross per 24 hour  Intake 789.49 ml  Output 500 ml  Net 289.49 ml   Filed Weights   05/31/20 0825 05/31/20 1545 06/01/20 0504  Weight: 63.5 kg 64.5 kg 64.5 kg    Examination:  General exam: Appears calm and comfortable  Respiratory system: Clear to auscultation. Respiratory effort normal. Cardiovascular system: S1 & S2 heard, RRR. Soft systolic murmur Gastrointestinal system: Abdomen is nondistended, soft and nontender. No organomegaly or masses felt. Normal bowel sounds heard. Central nervous system: Alert and oriented. No focal neurological deficits. Extremities: Symmetric 5 x 5 power. Skin: No rashes, lesions or ulcers Psychiatry: Judgement and insight appear normal. Mood & affect appropriate.     Data Reviewed: I have personally reviewed following labs and imaging studies  CBC: Recent Labs  Lab 05/30/20 2348 05/31/20 0406 06/01/20 0624  WBC 9.7 8.9 7.1  NEUTROABS 6.5  --   --   HGB 12.8 11.3* 12.3  HCT 36.9 33.0* 35.4*  MCV 93.7 97.1 94.7  PLT 407* 347 785   Basic Metabolic Panel: Recent Labs  Lab 05/30/20 2348 05/31/20 0406 06/01/20 0624  NA 140 139 139  K 3.8 3.8 3.6  CL 105 107 105  CO2 25 23 26   GLUCOSE 103* 102* 92  BUN 23* 19 20  CREATININE 0.87 0.70 0.88  CALCIUM 9.7 8.8* 8.6*   GFR: Estimated Creatinine Clearance: 63.6 mL/min (by C-G formula based on SCr of 0.88 mg/dL). Liver Function Tests: Recent Labs  Lab 05/30/20 2348  AST 33  ALT  20  ALKPHOS 34*  BILITOT 0.7  PROT 7.3  ALBUMIN 4.4   Recent Labs  Lab 05/30/20 2348  LIPASE 37   No results for input(s): AMMONIA in the last 168 hours. Coagulation Profile: Recent Labs  Lab 05/30/20 2348  INR 0.9   Cardiac Enzymes: No results for input(s): CKTOTAL, CKMB, CKMBINDEX, TROPONINI in the last 168 hours. BNP (last 3 results) No results for input(s): PROBNP in the last 8760 hours. HbA1C: No results for input(s): HGBA1C in the last 72 hours. CBG: No results for  input(s): GLUCAP in the last 168 hours. Lipid Profile: No results for input(s): CHOL, HDL, LDLCALC, TRIG, CHOLHDL, LDLDIRECT in the last 72 hours. Thyroid Function Tests: Recent Labs    05/31/20 0406  TSH 3.463   Anemia Panel: No results for input(s): VITAMINB12, FOLATE, FERRITIN, TIBC, IRON, RETICCTPCT in the last 72 hours. Urine analysis:    Component Value Date/Time   BILIRUBINUR negative 03/06/2020 1157   KETONESUR negative 03/06/2020 1157   PROTEINUR negative 03/06/2020 1157   UROBILINOGEN 0.2 03/06/2020 1157   NITRITE Negative 03/06/2020 1157   LEUKOCYTESUR Negative 03/06/2020 1157   Sepsis Labs: @LABRCNTIP (procalcitonin:4,lacticidven:4)  ) Recent Results (from the past 240 hour(s))  SARS CORONAVIRUS 2 (TAT 6-24 HRS) Nasopharyngeal Nasopharyngeal Swab     Status: None   Collection Time: 05/29/20  3:19 PM   Specimen: Nasopharyngeal Swab  Result Value Ref Range Status   SARS Coronavirus 2 NEGATIVE NEGATIVE Final    Comment: (NOTE) SARS-CoV-2 target nucleic acids are NOT DETECTED.  The SARS-CoV-2 RNA is generally detectable in upper and lower respiratory specimens during the acute phase of infection. Negative results do not preclude SARS-CoV-2 infection, do not rule out co-infections with other pathogens, and should not be used as the sole basis for treatment or other patient management decisions. Negative results must be combined with clinical observations, patient history, and epidemiological information. The expected result is Negative.  Fact Sheet for Patients: SugarRoll.be  Fact Sheet for Healthcare Providers: https://www.woods-mathews.com/  This test is not yet approved or cleared by the [place] FDA and  has been authorized for detection and/or diagnosis of SARS-CoV-2 by FDA under an Emergency Use Authorization (EUA). This EUA will remain  in effect (meaning this test can be used) for the duration of  the COVID-19 declaration under Se ction 564(b)(1) of the Act, 21 U.S.C. section 360bbb-3(b)(1), unless the authorization is terminated or revoked sooner.  Performed at [facility], [place] [address], [place], [place] [address]   SARS Coronavirus 2 by RT PCR (hospital order, performed in [facility] hospital lab) Nasopharyngeal Nasopharyngeal Swab     Status: None   Collection Time: 05/30/20 12:15 AM   Specimen: Nasopharyngeal Swab  Result Value Ref Range Status   SARS Coronavirus 2 NEGATIVE NEGATIVE Final    Comment: (NOTE) SARS-CoV-2 target nucleic acids are NOT DETECTED.  The SARS-CoV-2 RNA is generally detectable in upper and lower respiratory specimens during the acute phase of infection. The lowest concentration of SARS-CoV-2 viral copies this assay can detect is 250 copies / mL. A negative result does not preclude SARS-CoV-2 infection and should not be used as the sole basis for treatment or other patient management decisions.  A negative result may occur with improper specimen collection / handling, submission of specimen other than nasopharyngeal swab, presence of viral mutation(s) within the areas targeted by this assay, and inadequate number of viral copies (<250 copies / mL). A negative result must be combined with  clinical observations, patient history, and epidemiological information.  Fact Sheet for Patients:   StrictlyIdeas.no  Fact Sheet for Healthcare Providers: BankingDealers.co.za  This test is not yet approved or  cleared by the [place] FDA and has been authorized for detection and/or diagnosis of SARS-CoV-2 by FDA under an Emergency Use Authorization (EUA).  This EUA will remain in effect (meaning this test can be used) for the duration of the COVID-19 declaration under Section 564(b)(1) of the Act, 21 U.S.C. section 360bbb-3(b)(1), unless the authorization is terminated or revoked  sooner.  Performed at [facility], [address], [place], [place] [address]          Radiology Studies: CARDIAC CATHETERIZATION  Result Date: 05/31/2020 Conclusions: 1. Mild to moderate, nonobstructive coronary artery disease including 40% mid LAD stenosis and 30 to 40% ostial RCA lesion. 2. Widely patent overlapping stents extending from the proximal through distal RCA without significant in-stent restenosis. 3. Severely reduced left ventricular systolic function with akinesis of the mid and apical segments in a pattern consistent with stress-induced (Takotsubo) cardiomyopathy. 4. Moderately elevated left ventricular filling pressure (LVEDP 25-30 mmHg). Recommendations: 1. Discontinue IV fluids, IV nitroglycerin, and IV heparin. 2. Initiate gentle diuresis with furosemide 20 mg IV twice daily, for target net negative fluid balance of 2 L in 24 hours. 3. Switch metoprolol to carvedilol 3.125 mg twice daily. 4. Discontinue amlodipine and start lisinopril 2.5 mg daily, to be escalated as blood pressure and renal function allow. 5. Continue secondary prevention of coronary artery disease, including indefinite antiplatelet therapy with ticagrelor 60 mg twice daily. 6. Patient will likely need at least 1-2 more days of diuresis and optimization of goal-directed medical therapy for acute HFrEF secondary to stress-induced cardiomyopathy. Nelva Bush, MD [facility]   Exercise Tolerance Test  Result Date: 05/30/2020  Abnormal exercise tolerance test.  Baseline EKG demonstrates normal sinus rhythm with non-specific ST changed.  The patient demonstrates good exercise capacity with normal heart rate and blood pressure responses. No angina was reported.  There are 1.5-2 mm horizontal ST depressions in the inferolateral leads during peak stress.  Rare PVC's are noted during stress. However, frequent PVC's including ventricular couplets and bigeminy occurred during recovery; the patient  was asymptomatic.  [facility] treadmill score = -3.  Abnormal, intermediate to high risk exercise tolerance test with 1.5-2 mm horizontal ST depressions at peak stress and frequent PVC's during recovery.   CT Angio Chest/Abd/Pel for Dissection W and/or Wo Contrast  Result Date: 05/31/2020 CLINICAL DATA:  Chest pain and back pain with abnormal stress test EXAM: CT ANGIOGRAPHY CHEST, ABDOMEN AND PELVIS TECHNIQUE: Non-contrast CT of the chest was initially obtained. Multidetector CT imaging through the chest, abdomen and pelvis was performed using the standard protocol during bolus administration of intravenous contrast. Multiplanar reconstructed images and MIPs were obtained and reviewed to evaluate the vascular anatomy. CONTRAST:  150mL OMNIPAQUE IOHEXOL 350 MG/ML SOLN COMPARISON:  None. FINDINGS: CTA CHEST FINDINGS Cardiovascular: --Heart: The heart size is normal. There is a smallpericardial effusion. There are coronary artery calcifications. --Aorta: The course and caliber of the thoracic aorta are normal. There is mild aortic atherosclerotic calcification. Precontrast images show no aortic intramural hematoma. There is no blood pool, dissection or penetrating ulcer demonstrated on arterial phase postcontrast imaging. There is a conventional 3 vessel aortic arch branching pattern. The proximal arch vessels are widely patent. --Pulmonary Arteries: Contrast timing is optimized for preferential opacification of the aorta. Within that limitation, normal central pulmonary arteries. Mediastinum/Nodes:  No mediastinal, hilar or axillary lymphadenopathy. The visualized thyroid and thoracic esophageal course are unremarkable. Lungs/Pleura: No pulmonary nodules or masses. No pleural effusion or pneumothorax. No focal airspace consolidation. No focal pleural abnormality. Musculoskeletal: No chest wall abnormality. No acute osseous findings. Review of the MIP images confirms the above findings. CTA ABDOMEN AND PELVIS FINDINGS  VASCULAR Aorta: Normal caliber aorta without aneurysm, dissection, vasculitis or hemodynamically significant stenosis. There is mild aortic atherosclerosis. 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 The distal vessel is patent. SMA: Widely patent without dissection or stenosis. Renals: Single renal arteries bilaterally. No aneurysm, dissection, stenosis or evidence of fibromuscular dysplasia. 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 There is moderate atherosclerosis of the common and external iliac arteries without high-grade stenosis. Veins: Normal course and caliber of the major veins. Assessment is otherwise limited by the arterial dominant contrast phase. Review of the MIP images confirms the above findings. NON-VASCULAR Hepatobiliary: Normal hepatic contours and density. No visible biliary dilatation. Normal gallbladder. Pancreas: Normal contours without ductal dilatation. No peripancreatic fluid collection. Spleen: Normal arterial phase splenic enhancement pattern. Adrenals/Urinary Tract: --Adrenal glands: Normal. --Right kidney/ureter: No hydronephrosis or perinephric stranding. No nephrolithiasis. No obstructing ureteral stones. --Left kidney/ureter: No hydronephrosis or perinephric stranding. No nephrolithiasis. No obstructing ureteral stones. --Urinary bladder: Unremarkable. Stomach/Bowel: --Stomach/Duodenum: No hiatal hernia or other gastric abnormality. Normal duodenal course and caliber. --Small bowel: No dilatation or inflammation. --Colon: No focal abnormality. --Appendix: Normal. Lymphatic:  No abdominal or pelvic lymphadenopathy. Reproductive: Normal uterus and ovaries. Musculoskeletal. No bony spinal canal stenosis or focal osseous abnormality. Other: None. Review of the MIP images confirms the above findings. IMPRESSION: 1. No acute aortic syndrome.  2. 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 The distal vessel is patent. 3. 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 4. Small pericardial effusion and coronary artery atherosclerosis. Aortic Atherosclerosis (ICD10-I70.0). Electronically Signed   By: Ulyses Jarred M.D.   On: 05/31/2020 00:26        Scheduled Meds: . aspirin  81 mg Oral Pre-Cath  . atorvastatin  80 mg Oral Daily  . carvedilol  3.125 mg Oral BID WC  . enoxaparin (LOVENOX) injection  40 mg Subcutaneous Q24H  . furosemide  20 mg Intravenous BID  . levothyroxine  75 mcg Oral QAC breakfast  . lisinopril  2.5 mg Oral Daily  . pantoprazole  40 mg Oral Daily  . sodium chloride flush  3 mL Intravenous Q12H  . sodium chloride flush  3 mL Intravenous Q12H  . ticagrelor  60 mg Oral BID   Continuous Infusions: . sodium chloride    . sodium chloride       LOS: 1 day    Time spent: 30 min    Desma Maxim, MD [facility] Pager [phone]  If 7PM-7AM, please contact night-coverage [email or web address] Password [facility] 06/01/2020, 8:43 AM

## 2020-06-01 NOTE — Progress Notes (Signed)
Patient ambulated around nurses station. Patient tolerated well.

## 2020-06-01 NOTE — Progress Notes (Addendum)
Attending Note Patient seen and examined, agree with detailed note above,  Patient presentation and plan discussed on rounds.   EKG lab work, chest x-ray, echocardiogram reviewed independently by myself  Reports that she feels well, no complaints, no chest pain or shortness of breath Ambulated around the unit, no orthostasis symptoms Echocardiogram results discussed with her, Cardiomyopathy with wall motion of basal regions best preserved IVC flat Periods of low blood pressure but asymptomatic  On examination : alert oriented, no JVD, lungs clear to auscultation bilaterally, heart sounds regular normal S1-S2 no murmurs appreciated, abdomen soft nontender no significant lower extremity edema.  Musculoskeletal exam with good range of motion, neurologic exam grossly nonfocal  Lab work reviewed sodium 139 potassium 3.6 creatinine 0.88 BUN 20 WBC 7 hematocrit 12  Cath Cath 9/15 w/ patent RCA stent and moderate nonobs LAD, LCX, and RCA dzs. LV gram w/ EF of 25-35% and apical ballooning consistent w/ Takotsubo CM.   A/P: Nonischemic cardiomyopathy Echocardiogram consistent with stress cardiomyopathy, wall motion of basal regions best preserved Consistent with recent findings on catheterization --Recent findings discussed with her in detail Outpatient medications as below: Lasix 20 mg daily, metoprolol succinate 12.5 mg daily Lisinopril 2.5 mg daily--->will change to losartan 12.5 daily (with hope to start low-dose Entresto as an outpatient)  CAD with stable angina Catheterization this admission confirming patent overlapping stents in the RCA, nonobstructive disease in the proximal RCA, mid LAD, mid left circumflex --- On aspirin, low-dose Brilinta 60 twice daily --- High intensity statin --- Start metoprolol succinate 12.5 daily  PAD CT scan concerning for stenosis of proximal celiac vessels moderate to severe stenosis of iliac arteries --Close outpatient follow-up with Dr.  Saunders Revel Consideration of outpatient lower extremity arterial Dopplers  Essential hypertension Metoprolol succinate 12.5 daily,  amlodipine on hold Change ACE to arb FOR EASIER TRANSITION TO ENTRESTO AS OUTOT   Long discussion concerning recent events, findings on echo, cath, outpt follow up discussed, cardiac rehab Greater than 50% was spent in counseling and coordination of care with patient Total encounter time 35 minutes or more   Signed: Esmond Plants  M.D., Ph.D. [facility]     Progress Note  Patient Name: April Raymond Date of Encounter: 06/01/2020  Primary Cardiologist: Nelva Bush, MD  Subjective   No c/p, sob.  R wrist feels good.  Notes good response to IV lasix yesterday, though little recorded.  Inpatient Medications    Scheduled Meds:  aspirin  81 mg Oral Pre-Cath   atorvastatin  80 mg Oral Daily   enoxaparin (LOVENOX) injection  40 mg Subcutaneous Q24H   [START ON 06/02/2020] furosemide  20 mg Oral Daily   levothyroxine  75 mcg Oral QAC breakfast   lisinopril  2.5 mg Oral Daily   pantoprazole  40 mg Oral Daily   sodium chloride flush  3 mL Intravenous Q12H   sodium chloride flush  3 mL Intravenous Q12H   ticagrelor  60 mg Oral BID   Continuous Infusions:  sodium chloride     sodium chloride     PRN Meds: sodium chloride, sodium chloride, acetaminophen, ALPRAZolam, nitroGLYCERIN, ondansetron (ZOFRAN) IV, sodium chloride flush, sodium chloride flush, zolpidem   Vital Signs    Vitals:   05/31/20 2007 05/31/20 2258 06/01/20 0504 06/01/20 0906  BP: (!) 90/57 (!) 102/55 (!) 103/51 (!) 101/41  Pulse: 80 77 75 85  Resp: 16 17 20 18   Temp: 99.2 F (37.3 C) 97.6 F (36.4 C) 97.9 F (  36.6 C) 97.8 F (36.6 C)  TempSrc: Oral Oral Oral Oral  SpO2: 94% 96% 96% 98%  Weight:   64.5 kg   Height:        Intake/Output Summary (Last 24 hours) at 06/01/2020 0943 Last data filed at 06/01/2020 0906 Gross per 24 hour  Intake 789.49 ml  Output  700 ml  Net 89.49 ml   Filed Weights   05/31/20 0825 05/31/20 1545 06/01/20 0504  Weight: 63.5 kg 64.5 kg 64.5 kg    Physical Exam   GEN: Well nourished, well developed, in no acute distress.  HEENT: Grossly normal.  Neck: Supple, no JVD, carotid bruits, or masses. Cardiac: RRR, no murmurs, rubs, or gallops. No clubbing, cyanosis, edema.  Radials/DP/PT 2+ and equal bilaterally. R radial cath site w/o bleeding/bruit/hematoma. Respiratory:  Respirations regular and unlabored, clear to auscultation bilaterally. GI: Soft, nontender, nondistended, BS + x 4. MS: no deformity or atrophy. Skin: warm and dry, no rash. Neuro:  Strength and sensation are intact. Psych: AAOx3.  Normal affect.  Labs    Chemistry Recent Labs  Lab 05/30/20 2348 05/31/20 0406 06/01/20 0624  NA 140 139 139  K 3.8 3.8 3.6  CL 105 107 105  CO2 25 23 26   GLUCOSE 103* 102* 92  BUN 23* 19 20  CREATININE 0.87 0.70 0.88  CALCIUM 9.7 8.8* 8.6*  PROT 7.3  --   --   ALBUMIN 4.4  --   --   AST 33  --   --   ALT 20  --   --   ALKPHOS 34*  --   --   BILITOT 0.7  --   --   GFRNONAA >60 >60 >60  GFRAA >60 >60 >60  ANIONGAP 10 9 8      Hematology Recent Labs  Lab 05/30/20 2348 05/31/20 0406 06/01/20 0624  WBC 9.7 8.9 7.1  RBC 3.94 3.40* 3.74*  HGB 12.8 11.3* 12.3  HCT 36.9 33.0* 35.4*  MCV 93.7 97.1 94.7  MCH 32.5 33.2 32.9  MCHC 34.7 34.2 34.7  RDW 14.0 14.2 14.5  PLT 407* 347 353    Cardiac Enzymes  Recent Labs  Lab 05/30/20 2348 05/31/20 0158  TROPONINIHS 6,301* 5,910*      Lipids  Lab Results  Component Value Date   CHOL 141 05/19/2020   HDL 67 05/19/2020   LDLCALC 59 05/19/2020   TRIG 77 05/19/2020   Radiology    Exercise Tolerance Test  Result Date: 05/30/2020  Abnormal exercise tolerance test.  Baseline EKG demonstrates normal sinus rhythm with non-specific ST changed.  The patient demonstrates good exercise capacity with normal heart rate and blood pressure responses. No  angina was reported.  There are 1.5-2 mm horizontal ST depressions in the inferolateral leads during peak stress.  Rare PVC's are noted during stress. However, frequent PVC's including ventricular couplets and bigeminy occurred during recovery; the patient was asymptomatic.  [facility] treadmill score = -3.  Abnormal, intermediate to high risk exercise tolerance test with 1.5-2 mm horizontal ST depressions at peak stress and frequent PVC's during recovery.   CT Angio Chest/Abd/Pel for Dissection W and/or Wo Contrast  Result Date: 05/31/2020 CLINICAL DATA:  Chest pain and back pain with abnormal stress test EXAM: CT ANGIOGRAPHY CHEST, ABDOMEN AND PELVIS TECHNIQUE: Non-contrast CT of the chest was initially obtained. Multidetector CT imaging through the chest, abdomen and pelvis was performed using the standard protocol during bolus administration of intravenous contrast. Multiplanar reconstructed images and MIPs were obtained  and reviewed to evaluate the vascular anatomy. CONTRAST:  154mL OMNIPAQUE IOHEXOL 350 MG/ML SOLN COMPARISON:  None. FINDINGS: CTA CHEST FINDINGS Cardiovascular: --Heart: The heart size is normal. There is a smallpericardial effusion. There are coronary artery calcifications. --Aorta: The course and caliber of the thoracic aorta are normal. There is mild aortic atherosclerotic calcification. Precontrast images show no aortic intramural hematoma. There is no blood pool, dissection or penetrating ulcer demonstrated on arterial phase postcontrast imaging. There is a conventional 3 vessel aortic arch branching pattern. The proximal arch vessels are widely patent. --Pulmonary Arteries: Contrast timing is optimized for preferential opacification of the aorta. Within that limitation, normal central pulmonary arteries. Mediastinum/Nodes: No mediastinal, hilar or axillary lymphadenopathy. The visualized thyroid and thoracic esophageal course are unremarkable. Lungs/Pleura: No pulmonary nodules or masses.  No pleural effusion or pneumothorax. No focal airspace consolidation. No focal pleural abnormality. Musculoskeletal: No chest wall abnormality. No acute osseous findings. Review of the MIP images confirms the above findings. CTA ABDOMEN AND PELVIS FINDINGS VASCULAR Aorta: Normal caliber aorta without aneurysm, dissection, vasculitis or hemodynamically significant stenosis. There is mild aortic atherosclerosis. 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 The distal vessel is patent. SMA: Widely patent without dissection or stenosis. Renals: Single renal arteries bilaterally. No aneurysm, dissection, stenosis or evidence of fibromuscular dysplasia. 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 There is moderate atherosclerosis of the common and external iliac arteries without high-grade stenosis. Veins: Normal course and caliber of the major veins. Assessment is otherwise limited by the arterial dominant contrast phase. Review of the MIP images confirms the above findings. NON-VASCULAR Hepatobiliary: Normal hepatic contours and density. No visible biliary dilatation. Normal gallbladder. Pancreas: Normal contours without ductal dilatation. No peripancreatic fluid collection. Spleen: Normal arterial phase splenic enhancement pattern. Adrenals/Urinary Tract: --Adrenal glands: Normal. --Right kidney/ureter: No hydronephrosis or perinephric stranding. No nephrolithiasis. No obstructing ureteral stones. --Left kidney/ureter: No hydronephrosis or perinephric stranding. No nephrolithiasis. No obstructing ureteral stones. --Urinary bladder: Unremarkable. Stomach/Bowel: --Stomach/Duodenum: No hiatal hernia or other gastric abnormality. Normal duodenal course and caliber. --Small bowel: No dilatation or inflammation. --Colon: No focal abnormality. --Appendix: Normal. Lymphatic:  No  abdominal or pelvic lymphadenopathy. Reproductive: Normal uterus and ovaries. Musculoskeletal. No bony spinal canal stenosis or focal osseous abnormality. Other: None. Review of the MIP images confirms the above findings. IMPRESSION: 1. No acute aortic syndrome. 2. 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 The distal vessel is patent. 3. 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 4. Small pericardial effusion and coronary artery atherosclerosis. Aortic Atherosclerosis (ICD10-I70.0). Electronically Signed   By: Ulyses Jarred M.D.   On: 05/31/2020 00:26   Telemetry    RSR, occas sinus tachycardia - Personally Reviewed  Cardiac Studies   Cardiac Catheterization 9.15.2021  Left Main  Vessel is large. Vessel is angiographically normal.  Left Anterior Descending  Vessel is moderate in size. There is mild diffuse disease throughout the vessel.  Mid LAD lesion is 40% stenosed.  First Diagonal Branch  Vessel is small in size.  Second Diagonal Branch  Vessel is moderate in size.  Third Diagonal Branch  Vessel is small in size.  Left Circumflex  Vessel is large.  Mid Cx lesion is 20% stenosed.  First Obtuse Marginal Branch  Vessel is small in size.  Second Obtuse Marginal Branch  Vessel is large in size.  Third Obtuse  Marginal Branch  Vessel is large in size.  Right Coronary Artery  Vessel is moderate in size.  Ost RCA lesion is 35% stenosed.  Previously placed Prox RCA to Dist RCA stent (unknown type) is widely patent. Previously placed stent displays no restenosis.   Left Ventricle There is severe left ventricular systolic dysfunction. LV end diastolic pressure is moderately elevated. LVEDP 25-30 mmHg. The left ventricular ejection fraction is 25-35% by visual estimate. There are LV function abnormalities due to apical ballooning consistent with Takotsubo cardiomyopathy.  _____________    Patient Profile     59 y.o.  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  Assessment & Plan   1. Takotsubo Cardiomyopathy:  Pt presented w/ c/p and elevated HsTrop concerning for ACS/NSTEMI.  Cath 9/15 w/ patent RCA stent and moderate nonobs LAD, LCX, and RCA dzs. LV gram w/ EF of 25-35% and apical ballooning consistent w/ Takotsubo CM.  EDP 25-85mmHg @ time of cath and she has responded well to IV lasix, though I/O are inaccurate.  BUN/Creat up minimally this AM.  Euvolemic on exam and feels well.  She did receive a dose of IV lasix this AM.  Will change to 20mg  PO daily to start tomorrow.  BP has been soft - 90's to low 100's.  Will hold carvedilol this AM and try to cont lisinopril 2.5 mg daily as bp allows.  Will add toprol xl 12.5mg  daily to start tomorrow in place of carvedilol. No MRA 2/2 low bp's.  Ambulate this AM.  Follow BP.  If stable, can likely be discharged this afternoon.  2.  CAD/NSTEMI:  Chest pain on admission w/ elev HsT (6301  5910).  As above, cath w/o significant dzs and presentation more in line w/ Takotsubo CM.  No c/p or dyspnea this AM.  Cont brilinta 60 bid (has been on x several yrs in place of ASA, though no prior ASA allergy) and statin.  Will add back low-dose metoprolol succinate in AM (or on discharge if she goes this afternoon).  3.  Essential HTN:  bp's soft.  Hold off on home dose of amlodipine given indication for acei/bb.  4.  HL:  LDL 59.  Cont statin rx.  5.  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 No reported h/o post-prandial abd pain or claudication.  Consider  outpt ABI's in f/u.  Cont antiplatelet therapy and statin.  6.  Dispo:  Ambulate.  If stable, can likely d/c today on lasix 20mg  daily, lisinopril 2.5mg  daily, toprol xl 12.5mg  daily (to start tomorrow), brilinta 60mg  bid, atorvastatin 20 (previous home dose as LDL @ goal).  Would not cont home dose of amlodipine.  We will f/u in clinic in ~ 7-10 days.  Signed, Murray Hodgkins, NP  06/01/2020, 9:43 AM    For questions or updates, please contact   Please consult [email or web address] for contact info under Cardiology/STEMI.

## 2020-06-01 NOTE — Progress Notes (Signed)
[place] Poot to be D/C'd Home per MD order.  Discussed prescriptions and follow up appointments with the patient. Prescriptions given to patient, medication list explained in detail. Pt verbalized understanding.  Allergies as of 06/01/2020      Reactions   Humira [adalimumab] Rash      Medication List    STOP taking these medications   amLODipine 2.5 MG tablet Commonly known as: NORVASC   metoprolol tartrate 25 MG tablet Commonly known as: LOPRESSOR     TAKE these medications   aspirin 81 MG chewable tablet Chew 1 tablet (81 mg total) by mouth before cath procedure. Start taking on: June 02, 2020   atorvastatin 80 MG tablet Commonly known as: LIPITOR Take 1 tablet (80 mg total) by mouth daily. Start taking on: June 02, 2020 What changed:   medication strength  how much to take  when to take this   furosemide 20 MG tablet Commonly known as: LASIX Take 1 tablet (20 mg total) by mouth daily. Start taking on: June 02, 2020   levothyroxine 75 MCG tablet Commonly known as: SYNTHROID 1 tablet daily 6 days per week   levothyroxine 75 MCG tablet Commonly known as: SYNTHROID Take 1 tablet (75 mcg total) by mouth daily before breakfast.   losartan 25 MG tablet Commonly known as: COZAAR Take 0.5 tablets (12.5 mg total) by mouth daily. Start taking on: June 02, 2020   metoprolol succinate 25 MG 24 hr tablet Commonly known as: TOPROL-XL Take 0.5 tablets (12.5 mg total) by mouth daily. Start taking on: June 02, 2020   nitroGLYCERIN 0.4 MG SL tablet Commonly known as: NITROSTAT Place 1 tablet (0.4 mg total) under the tongue every 5 (five) minutes as needed for chest pain.   pantoprazole 40 MG tablet Commonly known as: PROTONIX Take 1 tablet (40 mg total) by mouth daily.   Premarin vaginal cream Generic drug: conjugated estrogens Place 0.5 grams twice weekly intravaginally.   ticagrelor 60 MG Tabs tablet Commonly known as: Brilinta Take 1  tablet (60 mg total) by mouth 2 (two) times daily.       Vitals:   06/01/20 0906 06/01/20 1016  BP: (!) 101/41 (!) 120/57  Pulse: 85 87  Resp: 18   Temp: 97.8 F (36.6 C) 98.7 F (37.1 C)  SpO2: 98% 98%    Tele box removed and returned. Skin clean, dry and intact without evidence of skin break down, no evidence of skin tears noted. IV catheter discontinued intact. Site without signs and symptoms of complications. Dressing and pressure applied. Pt denies pain at this time. No complaints noted.  An After Visit Summary was printed and given to the patient. Patient escorted via [place], and D/C home via private auto.  April Raymond

## 2020-06-01 NOTE — Progress Notes (Signed)
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  Patient with previous history of coronary artery disease, MI, presented to the ED by way of EMS after experiencing chest pain that radiated through to her back-between her shoulder blades.  Comorbidities:  Coronary artery disease Hypertension Hyperlipidemia Iron deficencey anemia    Medications:  Metoprolol succinate 12.5 mg daily Lasix 20 mg daily Losartan 12.5 mg daily Lipitor 80 mg daily Aspirin 81 mg daily  Considering starting Entresto as an outpatient.  Patient was given heart failure teaching booklet and magnet.  Spent time going over signs and symptoms to report to doctor-weight gain,encreasing edema, increasing SOB, PND, orthopnea, early satiety,and chest pain.  Stressed the importance of daily weights,recording, sticking with low sodium diet, not using salt at the table(which she already does), and fluid restriction.

## 2020-06-01 NOTE — Discharge Summary (Signed)
April Raymond JAS:[id number] DOB: [birth year] DOA: 05/30/2020  PCP: Venita Lick, NP  Admit date: 05/30/2020 Discharge date: 06/01/2020  Time spent: 35 minutes  Recommendations for Outpatient Follow-up:  1. Close follow-up with cardiology and primary care  2.    Discharge Diagnoses:  Principal Problem:   Stress-induced cardiomyopathy Active Problems:   History of MI (myocardial infarction)   CAD in native artery   Essential hypertension   Hashimoto's disease   Discharge Condition: good  Diet recommendation: heart healty  Filed Weights   05/31/20 0825 05/31/20 1545 06/01/20 0504  Weight: 63.5 kg 64.5 kg 64.5 kg    History of present illness:  Presented 9/14 with chest pain. Had had an abnormal exercise stress test earlier in the day. Admitted overnight with nstemi troponin 5-6,000. Cardiac cath 9/15 PM showing non-obstructive CAD, etiology thought to be stress-induced cardiomyopathy.   Hospital Course:  # stress-induced cardiomyopathy - non-obstructive CAD on cath 9/15. Currently asymptomatic. - per cardiology have stopped heparin gtt and nitroglycerine, substituting losartan for amlodipine, starting metoprolol and lasix and brilinta, increasing atorvastation to 80 and starting low-dose aspirin - f/u TTE, performed yesterday, read pending  # HTN - here bp stable, on the low side of normal, pt asymptomatic - stop amlodipine, start losartan  # Peripheral arterial disease - seen on CT - will need outpatient ABI vs. dopplers  # Hashimoto's - tsh wnl, cont synthyroid  Procedures:  Left heart cath, echocardiogram  Consultations:  cardiology  Discharge Exam: Vitals:   06/01/20 0906 06/01/20 1016  BP: (!) 101/41 (!) 120/57  Pulse: 85 87  Resp: 18   Temp: 97.8 F (36.6 C) 98.7 F (37.1 C)  SpO2: 98% 98%    See progress note for 9/16 for exam  Discharge Instructions   Discharge Instructions    Call MD for:  difficulty breathing, headache or visual  disturbances   Complete by: As directed    Call MD for:  extreme fatigue   Complete by: As directed    Call MD for:  persistant dizziness or light-headedness   Complete by: As directed    Call MD for:  severe uncontrolled pain   Complete by: As directed    Call MD for:  temperature >100.4   Complete by: As directed    Diet - low sodium heart healthy   Complete by: As directed    Discharge instructions   Complete by: As directed    Please follow-up with cardiology and your primary care physician within the next 1-2 weeks   Increase activity slowly   Complete by: As directed      Allergies as of 06/01/2020      Reactions   Humira [adalimumab] Rash      Medication List    STOP taking these medications   amLODipine 2.5 MG tablet Commonly known as: NORVASC   metoprolol tartrate 25 MG tablet Commonly known as: LOPRESSOR     TAKE these medications   aspirin 81 MG chewable tablet Chew 1 tablet (81 mg total) by mouth before cath procedure. Start taking on: June 02, 2020   atorvastatin 80 MG tablet Commonly known as: LIPITOR Take 1 tablet (80 mg total) by mouth daily. Start taking on: June 02, 2020 What changed:   medication strength  how much to take  when to take this   furosemide 20 MG tablet Commonly known as: LASIX Take 1 tablet (20 mg total) by mouth daily. Start taking on: June 02, 2020   levothyroxine  75 MCG tablet Commonly known as: SYNTHROID 1 tablet daily 6 days per week   levothyroxine 75 MCG tablet Commonly known as: SYNTHROID Take 1 tablet (75 mcg total) by mouth daily before breakfast.   losartan 25 MG tablet Commonly known as: COZAAR Take 0.5 tablets (12.5 mg total) by mouth daily. Start taking on: June 02, 2020   metoprolol succinate 25 MG 24 hr tablet Commonly known as: TOPROL-XL Take 0.5 tablets (12.5 mg total) by mouth daily. Start taking on: June 02, 2020   nitroGLYCERIN 0.4 MG SL tablet Commonly known as:  NITROSTAT Place 1 tablet (0.4 mg total) under the tongue every 5 (five) minutes as needed for chest pain.   pantoprazole 40 MG tablet Commonly known as: PROTONIX Take 1 tablet (40 mg total) by mouth daily.   Premarin vaginal cream Generic drug: conjugated estrogens Place 0.5 grams twice weekly intravaginally.   ticagrelor 60 MG Tabs tablet Commonly known as: Brilinta Take 1 tablet (60 mg total) by mouth 2 (two) times daily.      Allergies  Allergen Reactions  . Humira [Adalimumab] Rash    Follow-up Information    Theora Gianotti, NP Follow up on 06/07/2020.   Specialties: Nurse Practitioner, Cardiology, Radiology Why: 10:55 AM - Dr. Darnelle Bos NP Contact information: [place] [address] [phone]                The results of significant diagnostics from this hospitalization (including imaging, microbiology, ancillary and laboratory) are listed below for reference.    Significant Diagnostic Studies: CARDIAC CATHETERIZATION  Result Date: 05/31/2020 Conclusions: 1. Mild to moderate, nonobstructive coronary artery disease including 40% mid LAD stenosis and 30 to 40% ostial RCA lesion. 2. Widely patent overlapping stents extending from the proximal through distal RCA without significant in-stent restenosis. 3. Severely reduced left ventricular systolic function with akinesis of the mid and apical segments in a pattern consistent with stress-induced (Takotsubo) cardiomyopathy. 4. Moderately elevated left ventricular filling pressure (LVEDP 25-30 mmHg). Recommendations: 1. Discontinue IV fluids, IV nitroglycerin, and IV heparin. 2. Initiate gentle diuresis with furosemide 20 mg IV twice daily, for target net negative fluid balance of 2 L in 24 hours. 3. Switch metoprolol to carvedilol 3.125 mg twice daily. 4. Discontinue amlodipine and start lisinopril 2.5 mg daily, to be escalated as blood pressure and renal function allow. 5. Continue  secondary prevention of coronary artery disease, including indefinite antiplatelet therapy with ticagrelor 60 mg twice daily. 6. Patient will likely need at least 1-2 more days of diuresis and optimization of goal-directed medical therapy for acute HFrEF secondary to stress-induced cardiomyopathy. Nelva Bush, MD [facility]   Exercise Tolerance Test  Result Date: 05/30/2020  Abnormal exercise tolerance test.  Baseline EKG demonstrates normal sinus rhythm with non-specific ST changed.  The patient demonstrates good exercise capacity with normal heart rate and blood pressure responses. No angina was reported.  There are 1.5-2 mm horizontal ST depressions in the inferolateral leads during peak stress.  Rare PVC's are noted during stress. However, frequent PVC's including ventricular couplets and bigeminy occurred during recovery; the patient was asymptomatic.  [facility] treadmill score = -3.  Abnormal, intermediate to high risk exercise tolerance test with 1.5-2 mm horizontal ST depressions at peak stress and frequent PVC's during recovery.   CT Angio Chest/Abd/Pel for Dissection W and/or Wo Contrast  Result Date: 05/31/2020 CLINICAL DATA:  Chest pain and back pain with abnormal stress test EXAM: CT ANGIOGRAPHY CHEST, ABDOMEN AND  PELVIS TECHNIQUE: Non-contrast CT of the chest was initially obtained. Multidetector CT imaging through the chest, abdomen and pelvis was performed using the standard protocol during bolus administration of intravenous contrast. Multiplanar reconstructed images and MIPs were obtained and reviewed to evaluate the vascular anatomy. CONTRAST:  185mL OMNIPAQUE IOHEXOL 350 MG/ML SOLN COMPARISON:  None. FINDINGS: CTA CHEST FINDINGS Cardiovascular: --Heart: The heart size is normal. There is a smallpericardial effusion. There are coronary artery calcifications. --Aorta: The course and caliber of the thoracic aorta are normal. There is mild aortic atherosclerotic calcification.  Precontrast images show no aortic intramural hematoma. There is no blood pool, dissection or penetrating ulcer demonstrated on arterial phase postcontrast imaging. There is a conventional 3 vessel aortic arch branching pattern. The proximal arch vessels are widely patent. --Pulmonary Arteries: Contrast timing is optimized for preferential opacification of the aorta. Within that limitation, normal central pulmonary arteries. Mediastinum/Nodes: No mediastinal, hilar or axillary lymphadenopathy. The visualized thyroid and thoracic esophageal course are unremarkable. Lungs/Pleura: No pulmonary nodules or masses. No pleural effusion or pneumothorax. No focal airspace consolidation. No focal pleural abnormality. Musculoskeletal: No chest wall abnormality. No acute osseous findings. Review of the MIP images confirms the above findings. CTA ABDOMEN AND PELVIS FINDINGS VASCULAR Aorta: Normal caliber aorta without aneurysm, dissection, vasculitis or hemodynamically significant stenosis. There is mild aortic atherosclerosis. 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 The distal vessel is patent. SMA: Widely patent without dissection or stenosis. Renals: Single renal arteries bilaterally. No aneurysm, dissection, stenosis or evidence of fibromuscular dysplasia. 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 There is moderate atherosclerosis of the common and external iliac arteries without high-grade stenosis. Veins: Normal course and caliber of the major veins. Assessment is otherwise limited by the arterial dominant contrast phase. Review of the MIP images confirms the above findings. NON-VASCULAR Hepatobiliary: Normal hepatic contours and density. No visible biliary dilatation. Normal gallbladder. Pancreas: Normal contours without ductal dilatation. No peripancreatic fluid collection.  Spleen: Normal arterial phase splenic enhancement pattern. Adrenals/Urinary Tract: --Adrenal glands: Normal. --Right kidney/ureter: No hydronephrosis or perinephric stranding. No nephrolithiasis. No obstructing ureteral stones. --Left kidney/ureter: No hydronephrosis or perinephric stranding. No nephrolithiasis. No obstructing ureteral stones. --Urinary bladder: Unremarkable. Stomach/Bowel: --Stomach/Duodenum: No hiatal hernia or other gastric abnormality. Normal duodenal course and caliber. --Small bowel: No dilatation or inflammation. --Colon: No focal abnormality. --Appendix: Normal. Lymphatic:  No abdominal or pelvic lymphadenopathy. Reproductive: Normal uterus and ovaries. Musculoskeletal. No bony spinal canal stenosis or focal osseous abnormality. Other: None. Review of the MIP images confirms the above findings. IMPRESSION: 1. No acute aortic syndrome. 2. 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 The distal vessel is patent. 3. 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 4. Small pericardial effusion and coronary artery atherosclerosis. Aortic Atherosclerosis (ICD10-I70.0). Electronically Signed   By: Ulyses Jarred M.D.   On: 05/31/2020 00:26    Microbiology: Recent Results (from the past 240 hour(s))  SARS CORONAVIRUS 2 (TAT 6-24 HRS) Nasopharyngeal Nasopharyngeal Swab     Status: None   Collection Time: 05/29/20  3:19 PM   Specimen: Nasopharyngeal Swab  Result Value Ref Range Status   SARS Coronavirus 2 NEGATIVE NEGATIVE Final    Comment: (NOTE) SARS-CoV-2 target nucleic acids are NOT DETECTED.  The SARS-CoV-2 RNA is generally detectable in upper and lower respiratory specimens during the acute phase of infection. Negative results do  not preclude SARS-CoV-2 infection, do not rule out co-infections with other pathogens, and should not be used as the sole basis for treatment or other patient management decisions. Negative  results must be combined with clinical observations, patient history, and epidemiological information. The expected result is Negative.  Fact Sheet for Patients: SugarRoll.be  Fact Sheet for Healthcare Providers: https://www.woods-mathews.com/  This test is not yet approved or cleared by the [place] FDA and  has been authorized for detection and/or diagnosis of SARS-CoV-2 by FDA under an Emergency Use Authorization (EUA). This EUA will remain  in effect (meaning this test can be used) for the duration of the COVID-19 declaration under Se ction 564(b)(1) of the Act, 21 U.S.C. section 360bbb-3(b)(1), unless the authorization is terminated or revoked sooner.  Performed at [facility], [place] [address] [place], [place] [address]   SARS Coronavirus 2 by RT PCR (hospital order, performed in [facility] hospital lab) Nasopharyngeal Nasopharyngeal Swab     Status: None   Collection Time: 05/30/20 12:15 AM   Specimen: Nasopharyngeal Swab  Result Value Ref Range Status   SARS Coronavirus 2 NEGATIVE NEGATIVE Final    Comment: (NOTE) SARS-CoV-2 target nucleic acids are NOT DETECTED.  The SARS-CoV-2 RNA is generally detectable in upper and lower respiratory specimens during the acute phase of infection. The lowest concentration of SARS-CoV-2 viral copies this assay can detect is 250 copies / mL. A negative result does not preclude SARS-CoV-2 infection and should not be used as the sole basis for treatment or other patient management decisions.  A negative result may occur with improper specimen collection / handling, submission of specimen other than nasopharyngeal swab, presence of viral mutation(s) within the areas targeted by this assay, and inadequate number of viral copies (<250 copies / mL). A negative result must be combined with clinical observations, patient history, and epidemiological information.  Fact Sheet for Patients:    StrictlyIdeas.no  Fact Sheet for Healthcare Providers: BankingDealers.co.za  This test is not yet approved or  cleared by the [place] FDA and has been authorized for detection and/or diagnosis of SARS-CoV-2 by FDA under an Emergency Use Authorization (EUA).  This EUA will remain in effect (meaning this test can be used) for the duration of the COVID-19 declaration under Section 564(b)(1) of the Act, 21 U.S.C. section 360bbb-3(b)(1), unless the authorization is terminated or revoked sooner.  Performed at [facility], [address] [place], [place] [address]      Labs: Basic Metabolic Panel: Recent Labs  Lab 05/30/20 2348 05/31/20 0406 06/01/20 0624  NA 140 139 139  K 3.8 3.8 3.6  CL 105 107 105  CO2 25 23 26   GLUCOSE 103* 102* 92  BUN 23* 19 20  CREATININE 0.87 0.70 0.88  CALCIUM 9.7 8.8* 8.6*   Liver Function Tests: Recent Labs  Lab 05/30/20 2348  AST 33  ALT 20  ALKPHOS 34*  BILITOT 0.7  PROT 7.3  ALBUMIN 4.4   Recent Labs  Lab 05/30/20 2348  LIPASE 37   No results for input(s): AMMONIA in the last 168 hours. CBC: Recent Labs  Lab 05/30/20 2348 05/31/20 0406 06/01/20 0624  WBC 9.7 8.9 7.1  NEUTROABS 6.5  --   --   HGB 12.8 11.3* 12.3  HCT 36.9 33.0* 35.4*  MCV 93.7 97.1 94.7  PLT 407* 347 353   Cardiac Enzymes: No results for input(s): CKTOTAL, CKMB, CKMBINDEX, TROPONINI in the last 168 hours. BNP: BNP (last 3 results)  No results for input(s): BNP in the last 8760 hours.  ProBNP (last 3 results) No results for input(s): PROBNP in the last 8760 hours.  CBG: No results for input(s): GLUCAP in the last 168 hours.     Signed:  Desma Maxim MD.  [facility] 06/01/2020, 11:54 AM

## 2020-06-05 ENCOUNTER — Ambulatory Visit: Payer: No Typology Code available for payment source | Admitting: Surgery

## 2020-06-07 ENCOUNTER — Other Ambulatory Visit: Payer: Self-pay

## 2020-06-07 ENCOUNTER — Encounter: Payer: Self-pay | Admitting: Nurse Practitioner

## 2020-06-07 ENCOUNTER — Ambulatory Visit: Payer: No Typology Code available for payment source | Admitting: Nurse Practitioner

## 2020-06-07 VITALS — BP 98/70 | HR 67 | Ht 66.0 in | Wt 143.4 lb

## 2020-06-07 DIAGNOSIS — I5020 Unspecified systolic (congestive) heart failure: Secondary | ICD-10-CM

## 2020-06-07 DIAGNOSIS — E785 Hyperlipidemia, unspecified: Secondary | ICD-10-CM

## 2020-06-07 DIAGNOSIS — I739 Peripheral vascular disease, unspecified: Secondary | ICD-10-CM

## 2020-06-07 DIAGNOSIS — I2510 Atherosclerotic heart disease of native coronary artery without angina pectoris: Secondary | ICD-10-CM | POA: Diagnosis not present

## 2020-06-07 DIAGNOSIS — I5181 Takotsubo syndrome: Secondary | ICD-10-CM | POA: Diagnosis not present

## 2020-06-07 DIAGNOSIS — I10 Essential (primary) hypertension: Secondary | ICD-10-CM | POA: Diagnosis not present

## 2020-06-07 NOTE — Patient Instructions (Signed)
Medication Instructions:  Your physician recommends that you continue on your current medications as directed. Please refer to the Current Medication list given to you today.  *If you need a refill on your cardiac medications before your next appointment, please call your pharmacy*   Lab Work:  Your physician recommends that you have a lab draw (BMET) today.  If you have labs (blood work) drawn today and your tests are completely normal, you will receive your results only by: Marland Kitchen MyChart Message (if you have MyChart) OR . A paper copy in the mail If you have any lab test that is abnormal or we need to change your treatment, we will call you to review the results.   Testing/Procedures: Your physician has requested that you have an ankle brachial index (ABI). During this test an ultrasound and blood pressure cuff are used to evaluate the arteries that supply the arms and legs with blood. Allow thirty minutes for this exam. There are no restrictions or special instructions.    Follow-Up: At [facility], you and your health needs are our priority.  As part of our continuing mission to provide you with exceptional heart care, we have created designated Provider Care Teams.  These Care Teams include your primary Cardiologist (physician) and Advanced Practice Providers (APPs -  Physician Assistants and Nurse Practitioners) who all work together to provide you with the care you need, when you need it.  We recommend signing up for the patient portal called "MyChart".  Sign up information is provided on this After Visit Summary.  MyChart is used to connect with patients for Virtual Visits (Telemedicine).  Patients are able to view lab/test results, encounter notes, upcoming appointments, etc.  Non-urgent messages can be sent to your provider as well.   To learn more about what you can do with MyChart, go to [email or web address].    Your next appointment:   1 month(s)  The format for your next  appointment:   In Person  Provider:   You may see Nelva Bush, MD or the following Advanced Practice Providers on your designated Care Team:     Murray Hodgkins, NP     Other Instructions

## 2020-06-07 NOTE — Progress Notes (Addendum)
Office Visit    Patient Name: April Raymond Date of Encounter: 06/07/2020  Primary Care Provider:  Venita Lick, NP Primary Cardiologist:  Nelva Bush, MD  Chief Complaint    59 y/o female with a history of coronary artery disease with recent NSTEMI, Takotsubo cardiomyopathy, hypertension, hyperlipidemia, PAD, iron deficiency anemia 2/2 sm bowel AVMS s/p endoscopic treatment, hidradenitis suppurativa, and Hashimoto's disease, who presents today for follow-up post-hospital discharge for recent NSTEMI/Takotsubo cardiomyopathy.   Past Medical History    Past Medical History:  Diagnosis Date   CAD (coronary artery disease)    a. 2017 s/p prior MI & PCI RCA ([place]); b. 05/2020 Abnl ETT: 1.5-59mm horizontal ST dep in inflat leads @ peak stress w/ freq PVCs/couplets/bigeminy. HTN response; b. 05/2020 NSTEMI/Cath: LM nl, LAD 75m, LCX 20, RCA 35ost, patent prev placed prox-dist stent. EF 25-35% w/ apical ballooning.   Hashimoto's disease    Hidradenitis suppurativa    Hydradenitis    Hyperparathyroidism ([place])    Hypertension    Myocardial infarction ([place]) 2017   Pericardial effusion    a. 05/2020 Echo: sm to mod circumferential peric eff w/o tamponade.   Takotsubo cardiomyopathy    a. 05/2020 Echo: EF 35-40%, glob HK, though basal wall motion best preserved consistent w/ stress induced CM. Gr1 DD. Nl RV size/fxn. Sm to mod circumferential pericardial effusion w/o tamponade.   Past Surgical History:  Procedure Laterality Date   BREAST BIOPSY Left 1993   neg   CYST EXCISION  x2   LEFT HEART CATH AND CORONARY ANGIOGRAPHY N/A 05/31/2020   Procedure: LEFT HEART CATH AND CORONARY ANGIOGRAPHY;  Surgeon: Nelva Bush, MD;  Location: [place] [facility];  Service: Cardiovascular;  Laterality: N/A;   PARATHYROIDECTOMY     TONSILLECTOMY      Allergies  Allergies  Allergen Reactions   Humira [Adalimumab] Rash    History of Present Illness    59 y/o female  with the above past medical history including CAD, recent NSTEMI, Takotsubo cardiomyopathy, hypertension, hyperlipidemia, PAD, iron deficiency anemia 2/2 sm bowel AVMs s/p endoscopic treatment, Hashimoto's disease, and hidradenitis suppurativa. Cardiac history dates back to 2017 in the setting of MI (DES x3 to RCA, complicated by RV failure and cardiogenic shock requiring IABP). She established care with our clinic earlier this year and was doing well up until recently. She presented to the clinic on 05/24/2020 with complaints of chest tightness on exertion. She had an abnormal ETT on 05/30/2020, with ST depression in the inferolateral leads, frequent PVCs during recovery, though she was asymptomatic. She was also hypertensive during the test. Recommendations were made for follow-up cardiac catheterization.  Unfortunately, she developed chest pain on the drive home which radiated to her upper back, relieved with ASA and nitro. She reported to [facility] ED on 05/30/2020. 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 HsTroponin elevated at 6301. She underwent cardiac catheterization on 05/31/2020, in the setting of NSTEMI, which showed a patent RCA stent, mild, nonobstuctive disease to the LAD, LCx, and RCA, and a LVEF of 25-35%, with apical ballooning consistent with Taktosubo cardiomyopathy. Echo showed and EF of 35-40%, LV global hypokinesis with preserved basal wall motion, consistent w/ stress cardiomyopathy, grade I diastolic dysfunction, and LVEDP of 25-30 mmHg. She responded well to diuresis with IV lasix, however her blood pressure was soft, so her home dose of amlodipine  was discontinued. She was discharged home on 06/01/2020 on lasix 20mg  daily, losartan 25 mg daily, toprol xl 12.5mg  daily, brilinta 60mg  bid, and atrovastatin 80 mg daily, with plans to follow up as an outpatient in  7-10 days.   Since her hospital discharge, she has been doing well overall. She reports mild shortness of breath with exertion, mild fatigue, and occasional dizziness with standing in the first few days following her discharge. The dizziness has resolved, however, she still gets winded doing household chores. Her home SBP readings have ranged from 90 to low 100s. Today, she reports a 1.5 lb weight gain overnight. She is taking her lasix and all other medication as prescribed. She denies chest pain, palpitations, PND, orthopnea, syncope, edema, or early satiety. She is eager to return to her regular exercise routine of 1 hour of aerobics/resistance training 5x/week.   Home Medications    Prior to Admission medications   Medication Sig Start Date End Date Taking? Authorizing Provider  aspirin 81 MG chewable tablet Chew 1 tablet (81 mg total) by mouth before cath procedure. 06/02/20   Wouk, Ailene Rud, MD  atorvastatin (LIPITOR) 80 MG tablet Take 1 tablet (80 mg total) by mouth daily. 06/02/20   Wouk, Ailene Rud, MD  conjugated estrogens (PREMARIN) vaginal cream Place 0.5 grams twice weekly intravaginally. 04/24/20   Cannady, Henrine Screws T, NP  furosemide (LASIX) 20 MG tablet Take 1 tablet (20 mg total) by mouth daily. 06/02/20   Wouk, Ailene Rud, MD  levothyroxine (SYNTHROID) 75 MCG tablet 1 tablet daily 6 days per week    [provider]  losartan (COZAAR) 25 MG tablet Take 0.5 tablets (12.5 mg total) by mouth daily. 06/02/20   Wouk, Ailene Rud, MD  metoprolol succinate (TOPROL-XL) 25 MG 24 hr tablet Take 0.5 tablets (12.5 mg total) by mouth daily. 06/02/20   Wouk, Ailene Rud, MD  nitroGLYCERIN (NITROSTAT) 0.4 MG SL tablet Place 1 tablet (0.4 mg total) under the tongue every 5 (five) minutes as needed for chest pain. 11/05/19 05/24/20  End, Harrell Gave, MD  pantoprazole (PROTONIX) 40 MG tablet Take 1 tablet (40 mg total) by mouth daily. 11/05/19   Cannady, Henrine Screws T, NP  ticagrelor (BRILINTA) 60 MG  TABS tablet Take 1 tablet (60 mg total) by mouth 2 (two) times daily. 11/05/19   Cannady, Jolene T, NP  YUVAFEM 10 MCG TABS vaginal tablet Place 1 tablet vaginally 2 (two) times a week. 05/08/20   [provider]    Review of Systems    Mild dyspnea on exertion, and occasional dizziness with standing since discharge. She denies chest pain, palpitations, pnd, orthopnea, n, v, syncope, edema, weight gain, or early satiety. All other systems reviewed and are otherwise negative except as noted above.  Physical Exam    VS:  BP 98/70, HR 67, SpO2 98% on RA, Wt. 143.4 lbs.  GEN: Well nourished, well developed, in no acute distress. HEENT: normal. Neck: Supple, no JVD, carotid bruits, or masses. Cardiac: RRR, no murmurs, rubs, or gallops. No clubbing, cyanosis, edema.  PT 2+ and equal bilaterally.  R radial cath site mildly ecchymotic w/o bleeding/bruit/hematoma. Respiratory:  Respirations regular and unlabored, clear to auscultation bilaterally. GI: Soft, nontender, nondistended, BS + x 4. MS: no deformity or atrophy. Skin: warm and dry, no rash. Neuro:  Strength and sensation are intact. Psych: Normal affect.  Accessory Clinical Findings    ECG personally reviewed by me today -Normal sinus rhythm, 67, biphasic T wave abnormalities in V4-V6 -  no acute changes.  Lab Results  Component Value Date   WBC 7.1 06/01/2020   HGB 12.3 06/01/2020   HCT 35.4 (L) 06/01/2020   MCV 94.7 06/01/2020   PLT 353 06/01/2020   Lab Results  Component Value Date   CREATININE 0.88 06/01/2020   BUN 20 06/01/2020   NA 139 06/01/2020   K 3.6 06/01/2020   CL 105 06/01/2020   CO2 26 06/01/2020   Lab Results  Component Value Date   ALT 20 05/30/2020   AST 33 05/30/2020   ALKPHOS 34 (L) 05/30/2020   BILITOT 0.7 05/30/2020   Lab Results  Component Value Date   CHOL 141 05/19/2020   HDL 67 05/19/2020   LDLCALC 59 05/19/2020   TRIG 77 05/19/2020     Assessment & Plan    1.   CAD/NSTEMI/Takotsubo cardiomyopathy/HFrEF: H/o CAD s/p MI and RCA stenting x 3 in 2017 in [place].  Recent recurrent c/p prompting ETT, which was abnl.  She developed c/p while driving home after ETT prompting presentation to ED where HsTrop was elevated @ 6301.  Cath revealed nonobs dzs w/ depressed EF and apical ballooning consistent w/ Takotsubo.  Stable with steady improvement post hospital discharge, though she does have some ongoing shortness of breath with exertion and occasional dizziness with standing, though this seems to be improving. BP remains soft, however, recommend continuing current medical therapy (including brilinta indefinitely - 60mg  bid), if symptoms allow, with plans to follow up in 1 month, and repeat echo in 6-8 weeks. Discussed gradual activity progression with close attention to symptoms, as she is an avid exerciser. Given new lasix, will repeat BMET today. If creatinine is elevated, would consider discontinuing lasix, however, given ongoing shortness of breath with activity, will continue lasix 20 mg daily for now.   2. Essential HTN: BP soft today and with home readings. However, she seems to be tolerating current blood pressure, despite initial dizziness with standing in the few days following hospital discharge. She is on the lowest dose of metoprolol, losartan and lasix. If she can continue to tolerate her current medication regimen, I would prefer to continue this to allow for continued myocardial recovery. She is agreeable and will continue to monitor her blood pressure and for symptoms of hypotension.    3. Hyperlipidemia: Most recent LDL 59 (05/19/2020). Continue high intensity statin (Lipitor 80 mg daily) in the setting of CAD and recent Takotsubo CM.    4: PAD: Incidental finding on CTA during recent hospital visit with stenosis to the proximal celiac axis and internal iliac arteries. Will arrange for ABIs for future date in office. No GI symptoms.  5. Disposition: Continue  current medical therapy for CAD/NSTEMI/Takotsubo cardiomyopathy. Continue to monitor blood pressures. F/u BMET today.Will schedule ABIs for future date in office. Follow-up in office in 1 month. Repeat echo in 6-8 weeks.    Murray Hodgkins, NP 06/07/2020, 2:55 PM

## 2020-06-08 ENCOUNTER — Telehealth: Payer: Self-pay

## 2020-06-08 DIAGNOSIS — I5020 Unspecified systolic (congestive) heart failure: Secondary | ICD-10-CM

## 2020-06-08 LAB — BASIC METABOLIC PANEL
BUN/Creatinine Ratio: 19 (ref 12–28)
BUN: 21 mg/dL (ref 8–27)
CO2: 28 mmol/L (ref 20–29)
Calcium: 10.2 mg/dL (ref 8.7–10.3)
Chloride: 101 mmol/L (ref 96–106)
Creatinine, Ser: 1.11 mg/dL — ABNORMAL HIGH (ref 0.57–1.00)
GFR calc Af Amer: 62 mL/min/{1.73_m2} (ref >59)
GFR calc non Af Amer: 54 mL/min/{1.73_m2} — ABNORMAL LOW (ref >59)
Glucose: 88 mg/dL (ref 65–99)
Potassium: 4.6 mmol/L (ref 3.5–5.2)
Sodium: 142 mmol/L (ref 134–144)

## 2020-06-08 MED ORDER — FUROSEMIDE 20 MG PO TABS
10.0000 mg | ORAL_TABLET | Freq: Every day | ORAL | 1 refills | Status: DC
Start: 2020-06-08 — End: 2020-06-12

## 2020-06-08 NOTE — Telephone Encounter (Signed)
Call to patient to review labs.    Pt verbalized understanding and is agreeable to POC.    Advised pt to call for any further questions or concerns.  Orders updated as advised.   Pt also reports some SOB while in the hot garage last night. Denies any swelling or weight gain noted. No inc of salt in diet that she is aware of. Pt speaks in full sentences. No distress noted.  She will continue to monitor and will call if she has any further distress or rapid weight gain.

## 2020-06-08 NOTE — Telephone Encounter (Signed)
-----   Message from Theora Gianotti, NP sent at 06/08/2020 10:37 AM EDT ----- Creat up a little since starting lasix 20mg  daily.  Pls reduce to 1/2 tab (10mg ) daily.  F/u bmet in 1 wk to ensure stability.

## 2020-06-12 ENCOUNTER — Ambulatory Visit: Payer: No Typology Code available for payment source | Admitting: Cardiovascular Disease

## 2020-06-12 ENCOUNTER — Other Ambulatory Visit: Payer: Self-pay

## 2020-06-12 ENCOUNTER — Encounter: Payer: Self-pay | Admitting: Cardiovascular Disease

## 2020-06-12 ENCOUNTER — Telehealth: Payer: Self-pay | Admitting: Internal Medicine

## 2020-06-12 VITALS — BP 104/70 | HR 76 | Ht 66.0 in | Wt 143.0 lb

## 2020-06-12 DIAGNOSIS — I10 Essential (primary) hypertension: Secondary | ICD-10-CM | POA: Diagnosis not present

## 2020-06-12 DIAGNOSIS — I25118 Atherosclerotic heart disease of native coronary artery with other forms of angina pectoris: Secondary | ICD-10-CM

## 2020-06-12 DIAGNOSIS — I5181 Takotsubo syndrome: Secondary | ICD-10-CM

## 2020-06-12 DIAGNOSIS — E785 Hyperlipidemia, unspecified: Secondary | ICD-10-CM

## 2020-06-12 MED ORDER — FUROSEMIDE 20 MG PO TABS
20.0000 mg | ORAL_TABLET | ORAL | 3 refills | Status: DC
Start: 2020-06-12 — End: 2021-03-20

## 2020-06-12 NOTE — Patient Instructions (Signed)
Medication Instructions:  Lasix 40 daily for three days Then down to 20 mg daily, with extra 20 mg as needed  If you need a refill on your cardiac medications before your next appointment, please call your pharmacy.    Lab work: No new labs needed   If you have labs (blood work) drawn today and your tests are completely normal, you will receive your results only by: Marland Kitchen MyChart Message (if you have MyChart) OR . A paper copy in the mail If you have any lab test that is abnormal or we need to change your treatment, we will call you to review the results.   Testing/Procedures: No new testing needed   Follow-Up: At [facility], you and your health needs are our priority.  As part of our continuing mission to provide you with exceptional heart care, we have created designated Provider Care Teams.  These Care Teams include your primary Cardiologist (physician) and Advanced Practice Providers (APPs -  Physician Assistants and Nurse Practitioners) who all work together to provide you with the care you need, when you need it.  . You will need a follow up appointment in 3 Months with Dr. Saunders Raymond  . Providers on your designated Care Team:   . April Hodgkins, April Raymond . April Faith, April Raymond . April Raymond, April Raymond  Any Other Special Instructions Will Be Listed Below (If Applicable).  COVID-19 Vaccine Information can be found at: ShippingScam.co.uk For questions related to vaccine distribution or appointments, please email [email or web address] or call [phone].

## 2020-06-12 NOTE — Telephone Encounter (Signed)
Patient calling to report shortness of breath which started yesterday. It was difficult for her to lay down to sleep last night. When laying down states "feels like I cant get in enough air." She could not sleep and could not get comfortable.  When sitting up, her breathing feels normal. No swelling. Denies chest pain, dizziness, sweating, or nausea. BP 130/67, HR 69. Some shortness of breath noted with exertion.   Furosemide was decreased to 10 mg daily on 06/08/20 due to slightly elevated creatinine on 06/07/20. Patient is due for repeat BMET on this Wednesday or Thursday.

## 2020-06-12 NOTE — Telephone Encounter (Signed)
Pt c/o Shortness Of Breath: STAT if SOB developed within the last 24 hours or pt is noticeably SOB on the phone  1. Are you currently SOB (can you hear that pt is SOB on the phone)? yes  2. How long have you been experiencing SOB? 24 hours  3. Are you SOB when sitting or when up moving around? Up, but also last night when she was trying to sleep was worsening  4. Are you currently experiencing any other symptoms? no

## 2020-06-12 NOTE — Telephone Encounter (Signed)
Please see if patient can be seen by DOD or APP today.  If symptoms worsen, she will need to go to the ED for further evaluation.  In the meantime, she should take furosemide 40 mg PO x 1.  Nelva Bush, MD [facility]

## 2020-06-12 NOTE — Progress Notes (Signed)
Cardiology Office Note  Date:  06/12/2020   ID:  April Raymond, DOB [birth year], MRN [id number]  PCP:  Venita Lick, NP   Chief Complaint  Patient presents with  . Other    Patient c.o SOB. Meds reviewed verbally with patient.     HPI:  59 y/o female with a history of  coronary artery disease with  NSTEMI,  Takotsubo cardiomyopathy,  hypertension,  hyperlipidemia,  PAD,  iron deficiency anemia 2/2 sm bowel AVMS s/p endoscopic treatment, hidradenitis suppurativa,  Hashimoto's disease,  who presents today for follow-up of her NSTEMI/Takotsubo cardiomyopathy.   Last seen in clinic June 07, 2020  Prior cardiac history 2017 in the setting of MI (DES x3 to RCA, complicated by RV failure and cardiogenic shock requiring IABP).   [facility] ED on 05/30/2020. 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 HsTroponin elevated at 6301. She underwent cardiac catheterization on 05/31/2020, in the setting of NSTEMI, which showed a patent RCA stent, mild, nonobstuctive disease to the LAD, LCx, and RCA, and a LVEF of 25-35%, with apical ballooning consistent with Taktosubo cardiomyopathy.  Echo showed and EF of 35-40%, LV global hypokinesis with preserved basal wall motion, consistent w/ stress cardiomyopathy, grade I diastolic dysfunction, and LVEDP of 25-30 mmHg  Recently seen in clinic last week, told to decrease her Lasix down to 10 mg daily Over the past night or 2, having significant difficulty laying down Reports having PND/orthopnea "feels like I cant get in enough air." Also with some abdominal fullness When sitting up, her breathing feels normal. She called this morning, told to take Lasix 30 mg x 1 Has had good urination today, some improvement in her breathing and abdominal distention.  Not at her baseline yet She feels the Lasix 10 was not enough.   He  does have a very high fluid intake, lots of water.  Does not feel that she will be able to cut her water back  No EKG performed on today's visit  PMH:   has a past medical history of CAD (coronary artery disease), Hashimoto's disease, Hidradenitis suppurativa, Hydradenitis, Hyperparathyroidism ([place]), Hypertension, Myocardial infarction ([place]) (2017), Pericardial effusion, and Takotsubo cardiomyopathy.  PSH:    Past Surgical History:  Procedure Laterality Date  . BREAST BIOPSY Left 1993   neg  . CYST EXCISION  x2  . LEFT HEART CATH AND CORONARY ANGIOGRAPHY N/A 05/31/2020   Procedure: LEFT HEART CATH AND CORONARY ANGIOGRAPHY;  Surgeon: Nelva Bush, MD;  Location: [place] [facility];  Service: Cardiovascular;  Laterality: N/A;  . PARATHYROIDECTOMY    . TONSILLECTOMY      Current Outpatient Medications  Medication Sig Dispense Refill  . aspirin 81 MG chewable tablet Chew 1 tablet (81 mg total) by mouth before cath procedure. 30 tablet 1  . atorvastatin (LIPITOR) 80 MG tablet Take 1 tablet (80 mg total) by mouth daily. 30 tablet 1  . conjugated estrogens (PREMARIN) vaginal cream Place 0.5 grams twice weekly intravaginally. 42.5 g 12  . furosemide (LASIX) 20 MG tablet Take 1 tablet (20 mg total) by mouth as directed. Take once daily and the PM pill to be taken as needed for shortness of breath or swelling 180 tablet 3  . levothyroxine (SYNTHROID) 75 MCG tablet 1 tablet daily 6 days per week    . losartan (COZAAR) 25 MG tablet Take 0.5 tablets (12.5 mg total) by  mouth daily. 30 tablet 1  . metoprolol succinate (TOPROL-XL) 25 MG 24 hr tablet Take 0.5 tablets (12.5 mg total) by mouth daily. 30 tablet 1  . nitroGLYCERIN (NITROSTAT) 0.4 MG SL tablet Place 1 tablet (0.4 mg total) under the tongue every 5 (five) minutes as needed for chest pain. 25 tablet 3  . pantoprazole (PROTONIX) 40 MG tablet Take 1 tablet (40 mg total) by mouth daily. 90 tablet 3  . ticagrelor (BRILINTA) 60 MG TABS tablet  Take 1 tablet (60 mg total) by mouth 2 (two) times daily. 180 tablet 3   No current facility-administered medications for this visit.    Allergies:   Humira [adalimumab]   Social History:  The patient  reports that she quit smoking about 4 years ago. Her smoking use included cigarettes. She has a 40.00 pack-year smoking history. She has never used smokeless tobacco. She reports current alcohol use. She reports that she does not use drugs.   Family History:   family history includes Alzheimer's disease in her paternal grandmother; Breast cancer in her cousin; Breast cancer (age of onset: 91) in her mother; COPD in her mother; Cancer in her mother; Diverticulitis in her mother; Heart attack in her paternal grandfather; Heart attack (age of onset: 61) in her mother; Hypertension in her brother, father, and mother; Stroke in her maternal grandfather and mother.    Review of Systems: Review of Systems  Constitutional: Negative.   HENT: Negative.   Respiratory: Positive for shortness of breath.   Cardiovascular: Negative.   Gastrointestinal: Negative.   Musculoskeletal: Negative.   Neurological: Negative.   Psychiatric/Behavioral: Negative.   All other systems reviewed and are negative.   PHYSICAL EXAM: VS:  BP 104/70 (BP Location: Left Arm, Patient Position: Sitting, Cuff Size: Normal)   Pulse 76   Ht 5\' 6"  (1.676 m)   Wt 143 lb (64.9 kg)   LMP 11/16/2007 (Approximate)   SpO2 98%   BMI 23.08 kg/m  , BMI Body mass index is 23.08 kg/m. Constitutional:  oriented to person, place, and time. No distress.  HENT:  Head: Grossly normal Eyes:  no discharge. No scleral icterus.  Neck: No JVD, no carotid bruits  Cardiovascular: Regular rate and rhythm, no murmurs appreciated Pulmonary/Chest: Clear to auscultation bilaterally, no wheezes or rails Abdominal: Soft.  no distension.  no tenderness.  Musculoskeletal: Normal range of motion Neurological:  normal muscle tone. Coordination normal.  No atrophy Skin: Skin warm and dry Psychiatric: normal affect, pleasant   Recent Labs: 11/05/2019: Magnesium 2.1 05/30/2020: ALT 20 05/31/2020: TSH 3.463 06/01/2020: Hemoglobin 12.3; Platelets 353 06/07/2020: BUN 21; Creatinine, Ser 1.11; Potassium 4.6; Sodium 142    Lipid Panel Lab Results  Component Value Date   CHOL 141 05/19/2020   HDL 67 05/19/2020   LDLCALC 59 05/19/2020   TRIG 77 05/19/2020      Wt Readings from Last 3 Encounters:  06/12/20 143 lb (64.9 kg)  06/07/20 143 lb 6.4 oz (65 kg)  06/01/20 142 lb 3.2 oz (64.5 kg)       ASSESSMENT AND PLAN:  Problem List Items Addressed This Visit      Cardiology Problems   Essential hypertension   Relevant Medications   furosemide (LASIX) 20 MG tablet   Stress-induced cardiomyopathy - Primary   Relevant Medications   furosemide (LASIX) 20 MG tablet   Hyperlipidemia LDL goal <70   Relevant Medications   furosemide (LASIX) 20 MG tablet    Other Visit Diagnoses  Coronary artery disease of native artery of native heart with stable angina pectoris (HCC)       Relevant Medications   furosemide (LASIX) 20 MG tablet     Shortness of breath/pulmonary edema In the setting of cardiomyopathy Reports having high fluid intake, has PND orthopnea last night Feels better after taking Lasix 30 mg x 1 today Recommend she take 40 mg tomorrow and the next day then down to Lasix 20 daily BMP in several weeks time, order previously placed Given her high fluid intake she would likely need to continue on 20 daily with extra 20 as needed for shortness of breath abdominal distention Talked about fluid intake restrictions, monitoring weight and symptoms with adjustment of Lasix depending on her symptoms  Discussed restarting her exercise program, suggested she start slowly over the next several months with titration upwards as tolerated  CAD with stable angina Currently on aspirin with Brilinta Denies any anginal  symptoms  Disposition:   F/U 2-3 months with Dr. Saunders Revel   Total encounter time more than 25 minutes  Greater than 50% was spent in counseling and coordination of care with the patient    Signed, Esmond Plants, M.D., Ph.D. [place], [place]

## 2020-06-12 NOTE — Telephone Encounter (Signed)
Spoke to patient. She is agreeable to take the furosemide 40 mg x1 now. Discussed scheduling of appointment with Dr Rockey Situ who is DOD. There are no APP slots open at this time. Dr Rockey Situ agreed could add on patient at 4:40 pm today to his schedule. Patient agreeable.

## 2020-06-20 ENCOUNTER — Ambulatory Visit (INDEPENDENT_AMBULATORY_CARE_PROVIDER_SITE_OTHER): Payer: No Typology Code available for payment source | Admitting: Nurse Practitioner

## 2020-06-20 ENCOUNTER — Encounter: Payer: Self-pay | Admitting: Nurse Practitioner

## 2020-06-20 ENCOUNTER — Other Ambulatory Visit: Payer: Self-pay

## 2020-06-20 VITALS — BP 94/58 | HR 69 | Temp 97.9°F | Ht 66.0 in | Wt 144.6 lb

## 2020-06-20 DIAGNOSIS — I5181 Takotsubo syndrome: Secondary | ICD-10-CM | POA: Diagnosis not present

## 2020-06-20 DIAGNOSIS — I10 Essential (primary) hypertension: Secondary | ICD-10-CM | POA: Diagnosis not present

## 2020-06-20 DIAGNOSIS — I25118 Atherosclerotic heart disease of native coronary artery with other forms of angina pectoris: Secondary | ICD-10-CM

## 2020-06-20 DIAGNOSIS — E785 Hyperlipidemia, unspecified: Secondary | ICD-10-CM | POA: Diagnosis not present

## 2020-06-20 MED ORDER — LOSARTAN POTASSIUM 25 MG PO TABS
12.5000 mg | ORAL_TABLET | Freq: Every day | ORAL | 4 refills | Status: DC
Start: 2020-06-20 — End: 2021-07-28

## 2020-06-20 MED ORDER — ATORVASTATIN CALCIUM 80 MG PO TABS
80.0000 mg | ORAL_TABLET | Freq: Every day | ORAL | 4 refills | Status: DC
Start: 2020-06-20 — End: 2021-07-28

## 2020-06-20 MED ORDER — ASPIRIN 81 MG PO CHEW
81.0000 mg | CHEWABLE_TABLET | Freq: Every day | ORAL | 4 refills | Status: DC
Start: 2020-06-20 — End: 2020-11-15

## 2020-06-20 MED ORDER — METOPROLOL SUCCINATE ER 25 MG PO TB24
12.5000 mg | ORAL_TABLET | Freq: Every day | ORAL | 4 refills | Status: DC
Start: 2020-06-20 — End: 2020-09-17

## 2020-06-20 NOTE — Assessment & Plan Note (Signed)
Continue Ticagrelor, ASA, and Atorvastatin for prevention + collaboration with cardiology.  High risk family and patient history.

## 2020-06-20 NOTE — Assessment & Plan Note (Signed)
New diagnosis, followed by cardiology.  Continue current medication regimen and collaboration with cardiology.

## 2020-06-20 NOTE — Progress Notes (Signed)
BP (!) 94/58 (BP Location: Left Arm, Patient Position: Sitting, Cuff Size: Normal)    Pulse 69    Temp 97.9 F (36.6 C) (Oral)    Ht 5\' 6"  (1.676 m)    Wt 144 lb 9.6 oz (65.6 kg)    LMP 11/16/2007 (Approximate)    SpO2 100%    BMI 23.34 kg/m    Subjective:    Patient ID: April Raymond, female    DOB: September 10, 1961, 59 y.o.   MRN: [id number]  HPI: April Raymond is a 59 y.o. female  Chief Complaint  Patient presents with   Follow-up   HYPERTENSIONWITH CAD & HLD Followed by cardiology with recent visit 06/12/20, stress-induced cardiomyopathy and angina.  Had MI 4 years ago, age 25. Has a family history of MI, mother in her 54's and her father's dad passed in his 49's. Had parathyroid removed in 2018, history of hyperparathyroid. She continues on Ticagrelor, Amlodipine, and Metoprolol, Losartan, and NTG -- continues on Lasix, was cut down to 1/2 a pill due to mild decline in kidney function (on 06/07/20 -- GFR 54 and creatinine 1.11).  Has not had to use any NTG and is slowly starting to exercise again.  Echo on 05/31/20 -- 35-40% EF.  Returns to cardiology on 27th.  Taking Atorvastatin 80 MG, has had a few leg cramps with this.   Hypertension status:stable Satisfied with current treatment?yes Duration of hypertension:chronic BP monitoring frequency:daily BP range:<120/70 at home on average with medication changes BP medication side effects:no Medication compliance:good compliance Aspirin:ticagrelor, ASA Recurrent headaches:no Visual changes:no Palpitations:no Dyspnea:mild, more at night when lying down Chest pain:none recently Lower extremity edema:no Dizzy/lightheaded:no The ASCVD Risk score Mikey Bussing DC Jr., et al., 2013) failed to calculate for the following reasons:   The patient has a prior MI or stroke diagnosis  Relevant past medical, surgical, family and social history reviewed and updated as indicated. Interim medical history since our last visit  reviewed. Allergies and medications reviewed and updated.  Review of Systems  Constitutional: Negative for activity change, appetite change, diaphoresis, fatigue and fever.  Respiratory: Negative for cough, chest tightness, shortness of breath and wheezing.   Cardiovascular: Negative for chest pain, palpitations and leg swelling.  Gastrointestinal: Negative.   Endocrine: Negative for cold intolerance and heat intolerance.  Neurological: Negative.   Psychiatric/Behavioral: Negative.     Per HPI unless specifically indicated above     Objective:    BP (!) 94/58 (BP Location: Left Arm, Patient Position: Sitting, Cuff Size: Normal)    Pulse 69    Temp 97.9 F (36.6 C) (Oral)    Ht 5\' 6"  (1.676 m)    Wt 144 lb 9.6 oz (65.6 kg)    LMP 11/16/2007 (Approximate)    SpO2 100%    BMI 23.34 kg/m   Wt Readings from Last 3 Encounters:  06/20/20 144 lb 9.6 oz (65.6 kg)  06/12/20 143 lb (64.9 kg)  06/07/20 143 lb 6.4 oz (65 kg)    Physical Exam Vitals and nursing note reviewed.  Constitutional:      General: She is awake. She is not in acute distress.    Appearance: She is well-developed and well-groomed. She is not ill-appearing.  HENT:     Head: Normocephalic.     Right Ear: Hearing normal.     Left Ear: Hearing normal.  Eyes:     General: Lids are normal.        Right eye: No discharge.  Left eye: No discharge.     Conjunctiva/sclera: Conjunctivae normal.     Pupils: Pupils are equal, round, and reactive to light.  Cardiovascular:     Rate and Rhythm: Normal rate and regular rhythm.     Heart sounds: Normal heart sounds. No murmur heard.  No gallop.   Pulmonary:     Effort: Pulmonary effort is normal. No accessory muscle usage or respiratory distress.     Breath sounds: Normal breath sounds.  Abdominal:     General: Bowel sounds are normal.     Palpations: Abdomen is soft.  Musculoskeletal:     Cervical back: Normal range of motion and neck supple.     Right lower leg:  No edema.     Left lower leg: No edema.  Skin:    General: Skin is warm and dry.  Neurological:     Mental Status: She is alert and oriented to person, place, and time.  Psychiatric:        Attention and Perception: Attention normal.        Mood and Affect: Mood normal.        Speech: Speech normal.        Behavior: Behavior normal. Behavior is cooperative.        Thought Content: Thought content normal.    Results for orders placed or performed in visit on 37/62/83  Basic Metabolic Panel (BMET)  Result Value Ref Range   Glucose 88 65 - 99 mg/dL   BUN 21 8 - 27 mg/dL   Creatinine, Ser 1.11 (H) 0.57 - 1.00 mg/dL   GFR calc non Af Amer 54 (L) >59 mL/min/1.73   GFR calc Af Amer 62 >59 mL/min/1.73   BUN/Creatinine Ratio 19 12 - 28   Sodium 142 134 - 144 mmol/L   Potassium 4.6 3.5 - 5.2 mmol/L   Chloride 101 96 - 106 mmol/L   CO2 28 20 - 29 mmol/L   Calcium 10.2 8.7 - 10.3 mg/dL      Assessment & Plan:   Problem List Items Addressed This Visit      Cardiovascular and Mediastinum   Coronary artery disease of native artery of native heart with stable angina pectoris (HCC) - Primary    Continue Ticagrelor, ASA, and Atorvastatin for prevention + collaboration with cardiology.  High risk family and patient history.      Relevant Medications   metoprolol succinate (TOPROL-XL) 25 MG 24 hr tablet   aspirin 81 MG chewable tablet   losartan (COZAAR) 25 MG tablet   atorvastatin (LIPITOR) 80 MG tablet   Essential hypertension    Chronic, ongoing with BP well below goal at this time. Will continue current medication regimen and adjust as needed.  Continue collaboration with cardiology due to her significant family and personal cardiac history.  Labs today. Return in 3 months.      Relevant Medications   metoprolol succinate (TOPROL-XL) 25 MG 24 hr tablet   aspirin 81 MG chewable tablet   losartan (COZAAR) 25 MG tablet   atorvastatin (LIPITOR) 80 MG tablet   Other Relevant Orders    Basic metabolic panel   Lipid Panel w/o Chol/HDL Ratio   Stress-induced cardiomyopathy    New diagnosis, followed by cardiology.  Continue current medication regimen and collaboration with cardiology.      Relevant Medications   metoprolol succinate (TOPROL-XL) 25 MG 24 hr tablet   aspirin 81 MG chewable tablet   losartan (COZAAR) 25 MG tablet   atorvastatin (  LIPITOR) 80 MG tablet     Other   Hyperlipidemia LDL goal <70    Chronic, ongoing with history of MI.  Continue statin and adjust dose as needed.  If difficulty with increase in Atorvastatin, could consider switch to Rosuvastatin.  Discussed with patient and she will alert provider if ongoing leg cramp issues.      Relevant Medications   metoprolol succinate (TOPROL-XL) 25 MG 24 hr tablet   aspirin 81 MG chewable tablet   losartan (COZAAR) 25 MG tablet   atorvastatin (LIPITOR) 80 MG tablet       Follow up plan: Return in about 3 months (around 09/20/2020) for HTN/HLD-Angina, thyroid.

## 2020-06-20 NOTE — Assessment & Plan Note (Signed)
Chronic, ongoing with BP well below goal at this time. Will continue current medication regimen and adjust as needed.  Continue collaboration with cardiology due to her significant family and personal cardiac history.  Labs today. Return in 3 months.

## 2020-06-20 NOTE — Assessment & Plan Note (Signed)
Chronic, ongoing with history of MI.  Continue statin and adjust dose as needed.  If difficulty with increase in Atorvastatin, could consider switch to Rosuvastatin.  Discussed with patient and she will alert provider if ongoing leg cramp issues.

## 2020-06-20 NOTE — Patient Instructions (Signed)
Angina  Angina is very bad discomfort or pain in the chest, neck, arm, jaw, or back. The discomfort is caused by a lack of blood in the middle layer of the heart wall (myocardium). What are the causes? This condition is caused by a buildup of fat and cholesterol (plaque) in your arteries (atherosclerosis). This buildup narrows the arteries and makes it hard for blood to flow. What increases the risk? You are more likely to develop this condition if:  You have high levels of cholesterol in your blood.  You have high blood pressure (hypertension).  You have diabetes.  You have a family history of heart disease.  You are not active, or you do not exercise enough.  You feel sad (depressed).  You have been treated with high energy rays (radiation) on the left side of your chest. Other risk factors are:  Using tobacco.  Being very overweight (obese).  Eating a diet high in unhealthy fats (saturated fats).  Having stress, or being exposed to things that cause stress.  Using drugs, such as cocaine. Women have a greater risk for angina if:  They are older than 55.  They have stopped having their period (are in postmenopause). What are the signs or symptoms? Common symptoms of this condition in both men and women may include:  Chest pain, which may: ? Feel like a crushing or squeezing in the chest. ? Feel like a tightness, pressure, fullness, or heaviness in the chest. ? Last for more than a few minutes at a time. ? Stop and come back (recur) after a few minutes.  Pain in the neck, arm, jaw, or back.  Heartburn or upset stomach (indigestion) for no reason.  Being short of breath.  Feeling sick to your stomach (nauseous).  Sudden cold sweats. Women and people with diabetes may have other symptoms that are not usual, such as feeling:  Tired (fatigue).  Worried or nervous (anxious) for no reason.  Weak for no reason.  Dizzy or passing out (fainting). How is this  treated? This condition may be treated with:  Medicines. These are given to: ? Prevent blood clots. ? Prevent heart attack. ? Relax blood vessels and improve blood flow to the heart (nitrates). ? Reduce blood pressure. ? Improve the pumping action of the heart. ? Reduce fat and cholesterol in the blood.  A procedure to widen a narrowed or blocked artery in the heart (angioplasty).  Surgery to allow blood to go around a blocked artery (coronary artery bypass surgery). Follow these instructions at home: Medicines  Take over-the-counter and prescription medicines only as told by your doctor.  Do not take these medicines unless your doctor says that you can: ? NSAIDs. These include:  Ibuprofen.  Naproxen. ? Vitamin supplements that have vitamin A, vitamin E, or both. ? Hormone therapy that contains estrogen with or without progestin. Eating and drinking   Eat a heart-healthy diet that includes: ? Lots of fresh fruits and vegetables. ? Whole grains. ? Low-fat (lean) protein. ? Low-fat dairy products.  Follow instructions from your doctor about what you cannot eat or drink. Activity  Follow an exercise program that your doctor tells you.  Talk with your doctor about joining a program to help improve the health of your heart (cardiac rehab).  When you feel tired, take a break. Plan breaks if you know you are going to feel tired. Lifestyle   Do not use any products that contain nicotine or tobacco. This includes cigarettes, e-cigarettes, and   chewing tobacco. If you need help quitting, ask your doctor.  If your doctor says you can drink alcohol: ? Limit how much you use to:  0-1 drink a day for women who are not pregnant.  0-2 drinks a day for men. ? Be aware of how much alcohol is in your drink. In the [place] one drink equals:  One 12 oz bottle of beer (355 mL).  One 5 oz glass of wine (148 mL).  One 1 oz glass of hard liquor (44 mL). General instructions  Stay  at a healthy weight. If your doctor tells you to do so, work with him or her to lose weight.  Learn to deal with stress. If you need help, ask your doctor.  Keep your vaccines up to date. Get a flu shot every year.  Talk with your doctor if you feel sad. Take a screening test to see if you are at risk for depression.  Work with your doctor to manage any other health problems that you have. These may include diabetes or high blood pressure.  Keep all follow-up visits as told by your doctor. This is important. Get help right away if:  You have pain in your chest, neck, arm, jaw, or back, and the pain: ? Lasts more than a few minutes. ? Comes back. ? Does not get better after you take medicine under your tongue (sublingual nitroglycerin). ? Keeps getting worse. ? Comes more often.  You have any of these problems for no reason: ? Sweating a lot. ? Heartburn or upset stomach. ? Shortness of breath. ? Trouble breathing. ? Feeling sick to your stomach. ? Throwing up (vomiting). ? Feeling more tired than normal. ? Feeling nervous or worrying more than normal. ? Weakness.  You are suddenly dizzy or light-headed.  You pass out. These symptoms may be an emergency. Do not wait to see if the symptoms will go away. Get medical help right away. Call your local emergency services (911 in the [place] Do not drive yourself to the hospital. Summary  Angina is very bad discomfort or pain in the chest, neck, arm, neck, or back.  Symptoms include chest pain, heartburn or upset stomach for no reason, and shortness of breath.  Women or people with diabetes may have symptoms that are not usual, such as feeling nervous or worried for no reason, weak for no reason, or tired.  Take all medicines only as told by your doctor.  You should eat a heart-healthy diet and follow an exercise program. This information is not intended to replace advice given to you by your health care provider. Make sure you  discuss any questions you have with your health care provider. Document Revised: 04/20/2018 Document Reviewed: 04/20/2018 Elsevier Patient Education  2020 Else[employer] Inc.  

## 2020-06-21 LAB — LIPID PANEL W/O CHOL/HDL RATIO
Cholesterol, Total: 133 mg/dL (ref 100–199)
HDL: 53 mg/dL (ref >39)
LDL Chol Calc (NIH): 57 mg/dL (ref 0–99)
Triglycerides: 131 mg/dL (ref 0–149)
VLDL Cholesterol Cal: 23 mg/dL (ref 5–40)

## 2020-06-21 LAB — BASIC METABOLIC PANEL
BUN/Creatinine Ratio: 19 (ref 12–28)
BUN: 21 mg/dL (ref 8–27)
CO2: 28 mmol/L (ref 20–29)
Calcium: 9.4 mg/dL (ref 8.7–10.3)
Chloride: 100 mmol/L (ref 96–106)
Creatinine, Ser: 1.11 mg/dL — ABNORMAL HIGH (ref 0.57–1.00)
GFR calc Af Amer: 62 mL/min/{1.73_m2} (ref >59)
GFR calc non Af Amer: 54 mL/min/{1.73_m2} — ABNORMAL LOW (ref >59)
Glucose: 112 mg/dL — ABNORMAL HIGH (ref 65–99)
Potassium: 4 mmol/L (ref 3.5–5.2)
Sodium: 140 mmol/L (ref 134–144)

## 2020-06-21 NOTE — Progress Notes (Signed)
Contacted via Melvina afternoon Blossom, your labs have returned.  Kidney function remains stable with some very mild kidney disease noted that we will continue to monitor closely to ensure no decline.  Recommend increase water intake at home and continue current medications.  Cholesterol levels remain stable, continue your statin for prevention.  Any questions? Keep being awesome!!  Thank you for allowing me to participate in your care. Kindest regards, Elbert Polyakov

## 2020-06-22 ENCOUNTER — Ambulatory Visit: Payer: No Typology Code available for payment source | Admitting: Family

## 2020-06-29 ENCOUNTER — Other Ambulatory Visit: Payer: Self-pay

## 2020-06-29 ENCOUNTER — Ambulatory Visit (INDEPENDENT_AMBULATORY_CARE_PROVIDER_SITE_OTHER): Payer: No Typology Code available for payment source

## 2020-06-29 ENCOUNTER — Encounter: Payer: Self-pay | Admitting: *Deleted

## 2020-06-29 DIAGNOSIS — I739 Peripheral vascular disease, unspecified: Secondary | ICD-10-CM

## 2020-06-30 ENCOUNTER — Telehealth: Payer: Self-pay | Admitting: *Deleted

## 2020-06-30 NOTE — Telephone Encounter (Signed)
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  Pressures in legs were normal.  Recommend establishment with Dr. Fletcher Anon given finding of blockage to discuss walking program and med mgmt.

## 2020-06-30 NOTE — Telephone Encounter (Signed)
Reviewed results and recommendations with patient and she verbalized understanding and was agreeable to schedule appointment with Dr. Fletcher Anon to review results, plan, and discuss care going forward for her. Advised I would have scheduling call to arrange that appointment and she was agreeable.

## 2020-07-10 ENCOUNTER — Encounter: Payer: Self-pay | Admitting: Nurse Practitioner

## 2020-07-10 ENCOUNTER — Other Ambulatory Visit: Payer: Self-pay

## 2020-07-10 ENCOUNTER — Telehealth (INDEPENDENT_AMBULATORY_CARE_PROVIDER_SITE_OTHER): Payer: No Typology Code available for payment source | Admitting: Nurse Practitioner

## 2020-07-10 VITALS — BP 110/69 | Wt 142.0 lb

## 2020-07-10 DIAGNOSIS — N952 Postmenopausal atrophic vaginitis: Secondary | ICD-10-CM | POA: Diagnosis not present

## 2020-07-10 LAB — UA/M W/RFLX CULTURE, ROUTINE
Bilirubin, UA: NEGATIVE
Glucose, UA: NEGATIVE
Ketones, UA: NEGATIVE
Leukocytes,UA: NEGATIVE
Nitrite, UA: NEGATIVE
Protein,UA: NEGATIVE
RBC, UA: NEGATIVE
Specific Gravity, UA: <1.005 — ABNORMAL LOW (ref 1.005–1.030)
Urobilinogen, Ur: 0.2 mg/dL (ref 0.2–1.0)
pH, UA: 5.5 (ref 5.0–7.5)

## 2020-07-10 LAB — WET PREP FOR TRICH, YEAST, CLUE
Clue Cell Exam: NEGATIVE
Trichomonas Exam: NEGATIVE
Yeast Exam: NEGATIVE

## 2020-07-10 MED ORDER — METRONIDAZOLE 500 MG PO TABS: 500.0000 mg | ORAL_TABLET | Freq: Two times a day (BID) | ORAL | 0 refills | Status: DC

## 2020-07-10 NOTE — Patient Instructions (Signed)
Atrophic Vaginitis Atrophic vaginitis is a condition in which the tissues that line the vagina become dry and thin. This condition occurs in women who have stopped having their period. It is caused by a drop in a female hormone (estrogen). This hormone helps:  To keep the vagina moist.  To make a clear fluid. This clear fluid helps: ? To make the vagina ready for sex. ? To protect the vagina from infection. If the lining of the vagina is dry and thin, it may cause irritation, burning, or itchiness. It may also:  Make sex painful.  Make an exam of your vagina painful.  Cause bleeding.  Make you lose interest in sex.  Cause a burning feeling when you pee (urinate).  Cause a brown or yellow fluid to come from your vagina. Some women do not have symptoms. Follow these instructions at home: Medicines  Take over-the-counter and prescription medicines only as told by your doctor.  Do not use herbs or other medicines unless your doctor says it is okay.  Use medicines for for dryness. These include: ? Oils to make the vagina soft. ? Creams. ? Moisturizers. General instructions  Do not douche.  Do not use products that can make your vagina dry. These include: ? Scented sprays. ? Scented tampons. ? Scented soaps.  Sex can help increase blood flow and soften the tissue in the vagina. If it hurts to have sex: ? Tell your partner. ? Use products to make sex more comfortable. Use these only as told by your doctor. Contact a doctor if you:  Have discharge from the vagina that is different than usual.  Have a bad smell coming from your vagina.  Have new symptoms.  Do not get better.  Get worse. Summary  Atrophic vaginitis is a condition in which the lining of the vagina becomes dry and thin.  This condition affects women who have stopped having their periods.  Treatment may include using products that help make the vagina soft.  Call a doctor if do not get better with  treatment. This information is not intended to replace advice given to you by your health care provider. Make sure you discuss any questions you have with your health care provider. Document Revised: 09/15/2017 Document Reviewed: 09/15/2017 Elsevier Patient Education  2020 Elsevier Inc.  

## 2020-07-10 NOTE — Assessment & Plan Note (Signed)
Chronic, with current acute irritation.  Wet prep negative and UA negative.  In past was treated with Flagyl for similar irritation, which improved symptoms.  Will trial this, although discussed with patient negative results today.  Continue Premarin cream as ordered.  Discussed with patient that if ongoing irritation presents may need to consider referral to GYN to further assess for possible underlying dermatitis or other issue causing recurrent vaginal irritation.  Return to office for worsening or ongoing symptoms.

## 2020-07-10 NOTE — Progress Notes (Signed)
BP 110/69 (BP Location: Left Arm, Patient Position: Sitting, Cuff Size: Normal)   Wt 142 lb (64.4 kg)   LMP 11/16/2007 (Approximate)   BMI 22.92 kg/m    Subjective:    Patient ID: April Raymond, female    DOB: 1960-09-18, 59 y.o.   MRN: [id number]  HPI: April Raymond is a 59 y.o. female  Chief Complaint  Patient presents with  . Vaginitis    . This visit was completed via telephone due to the restrictions of the COVID-19 pandemic. All issues as above were discussed and addressed but no physical exam was performed. If it was felt that the patient should be evaluated in the office, they were directed there. The patient verbally consented to this visit. Patient was unable to complete an audio/visual visit due to Technical difficulties,Lack of internet. Due to the catastrophic nature of the COVID-19 pandemic, this visit was done through audio contact only. . Location of the patient: home . Location of the provider: work . Those involved with this call:  . Provider: Marnee Guarneri, DNP . CMA: Yvonna Alanis, CMA . Front Desk/Registration: Don Perking  . Time spent on call: 20 minutes on the phone discussing health concerns. 15 minutes total spent in review of patient's record and preparation of their chart.  . I verified patient identity using two factors (patient name and date of birth). Patient consents verbally to being seen via telemedicine visit today.    VAGINAL IRRITATION Presents for vaginal irritation, similar to past visit 03/21/20.  Started with vaginal irritation on Saturday, had sexual intercourse a few days prior.   Reports the irritation itchy and dry.  Last time she had similar issue her testing returned negative and when treated with Flagyl symptoms improved. She also was started on vaginal estrogen months back which helped with symptoms until now Duration:days Discharge description:none noticed Pruritus:yes Dysuria:none Malodorous:no Urinary  frequency:no Fevers:no Abdominal pain:no Sexual activity:monogamous History of sexually transmitted diseases:no Recent antibiotic use:no Context: stable Treatments attempted:none  Relevant past medical, surgical, family and social history reviewed and updated as indicated. Interim medical history since our last visit reviewed. Allergies and medications reviewed and updated.  Review of Systems  Constitutional: Negative for activity change, appetite change, diaphoresis, fatigue and fever.  Respiratory: Negative for cough, chest tightness and shortness of breath.   Cardiovascular: Negative for chest pain, palpitations and leg swelling.  Gastrointestinal: Negative.   Genitourinary: Negative for dysuria, urgency, vaginal bleeding, vaginal discharge and vaginal pain.  Neurological: Negative.   Psychiatric/Behavioral: Negative.     Per HPI unless specifically indicated above     Objective:    BP 110/69 (BP Location: Left Arm, Patient Position: Sitting, Cuff Size: Normal)   Wt 142 lb (64.4 kg)   LMP 11/16/2007 (Approximate)   BMI 22.92 kg/m   Wt Readings from Last 3 Encounters:  07/10/20 142 lb (64.4 kg)  06/20/20 144 lb 9.6 oz (65.6 kg)  06/12/20 143 lb (64.9 kg)    Physical Exam   Unable to perform due to telephone visit only.  Results for orders placed or performed in visit on 07/10/20  WET PREP FOR [place], YEAST, CLUE   Specimen: Vaginal; Sterile Swab   Sterile Swab  Result Value Ref Range   Trichomonas Exam Negative Negative   Yeast Exam Negative Negative   Clue Cell Exam Negative Negative  UA/M w/rflx Culture, Routine   Specimen: Urine   Urine  Result Value Ref Range   Specific Gravity, UA <1.005 (L) 1.005 -  1.030   pH, UA 5.5 5.0 - 7.5   Color, UA Yellow Yellow   Appearance Ur Clear Clear   Leukocytes,UA Negative Negative   Protein,UA Negative Negative/Trace   Glucose, UA Negative Negative   Ketones, UA Negative Negative   RBC, UA Negative Negative     Bilirubin, UA Negative Negative   Urobilinogen, Ur 0.2 0.2 - 1.0 mg/dL   Nitrite, UA Negative Negative      Assessment & Plan:   Problem List Items Addressed This Visit      Genitourinary   Vaginal atrophy - Primary    Chronic, with current acute irritation.  Wet prep negative and UA negative.  In past was treated with Flagyl for similar irritation, which improved symptoms.  Will trial this, although discussed with patient negative results today.  Continue Premarin cream as ordered.  Discussed with patient that if ongoing irritation presents may need to consider referral to GYN to further assess for possible underlying dermatitis or other issue causing recurrent vaginal irritation.  Return to office for worsening or ongoing symptoms.      Relevant Orders   UA/M w/rflx Culture, Routine (Completed)   WET PREP FOR [place], YEAST, CLUE (Completed)       Follow up plan: Return if symptoms worsen or fail to improve.

## 2020-07-12 ENCOUNTER — Encounter: Payer: Self-pay | Admitting: Nurse Practitioner

## 2020-07-12 ENCOUNTER — Ambulatory Visit: Payer: No Typology Code available for payment source | Admitting: Nurse Practitioner

## 2020-07-12 ENCOUNTER — Other Ambulatory Visit: Payer: Self-pay

## 2020-07-12 VITALS — BP 134/80 | HR 73 | Ht 66.0 in | Wt 143.0 lb

## 2020-07-12 DIAGNOSIS — I5181 Takotsubo syndrome: Secondary | ICD-10-CM

## 2020-07-12 DIAGNOSIS — E785 Hyperlipidemia, unspecified: Secondary | ICD-10-CM

## 2020-07-12 DIAGNOSIS — I2510 Atherosclerotic heart disease of native coronary artery without angina pectoris: Secondary | ICD-10-CM

## 2020-07-12 DIAGNOSIS — I428 Other cardiomyopathies: Secondary | ICD-10-CM | POA: Diagnosis not present

## 2020-07-12 DIAGNOSIS — I10 Essential (primary) hypertension: Secondary | ICD-10-CM

## 2020-07-12 DIAGNOSIS — I739 Peripheral vascular disease, unspecified: Secondary | ICD-10-CM

## 2020-07-12 NOTE — Patient Instructions (Signed)
Medication Instructions:   1. Your physician recommends that you continue on your current medications as directed. Please refer to the Current Medication list given to you today.  *If you need a refill on your cardiac medications before your next appointment, please call your pharmacy*   Lab Work:  1. None Ordered  If you have labs (blood work) drawn today and your tests are completely normal, you will receive your results only by: Marland Kitchen MyChart Message (if you have MyChart) OR . A paper copy in the mail If you have any lab test that is abnormal or we need to change your treatment, we will call you to review the results.   Testing/Procedures:  1. Echocardiogram  Please return to [facility] on ______________ at _______________ AM/PM for an Echocardiogram. Your physician has requested that you have an echocardiogram. Echocardiography is a painless test that uses sound waves to create images of your heart. It provides your doctor with information about the size and shape of your heart and how well your heart's chambers and valves are working. This procedure takes approximately one hour. There are no restrictions for this procedure. Please note; depending on visual quality an IV may need to be placed.     Follow-Up: At [facility], you and your health needs are our priority.  As part of our continuing mission to provide you with exceptional heart care, we have created designated Provider Care Teams.  These Care Teams include your primary Cardiologist (physician) and Advanced Practice Providers (APPs -  Physician Assistants and Nurse Practitioners) who all work together to provide you with the care you need, when you need it.  We recommend signing up for the patient portal called "MyChart".  Sign up information is provided on this After Visit Summary.  MyChart is used to connect with patients for Virtual Visits (Telemedicine).  Patients are able to view lab/test results,  encounter notes, upcoming appointments, etc.  Non-urgent messages can be sent to your provider as well.   To learn more about what you can do with MyChart, go to [email or web address].    Your next appointment:   3 month(s)  The format for your next appointment:   In Person  Provider:   Nelva Bush, MD

## 2020-07-12 NOTE — Progress Notes (Signed)
Office Visit    Patient Name: April Raymond Date of Encounter: 07/12/2020  Primary Care Provider:  Venita Lick, NP Primary Cardiologist:  Nelva Bush, MD  Chief Complaint    59 y/o female with a history of CAD/NSTEMI, Takotsubo cardiomyopathy, hypertension, hyperlipidemia, PAD, iron deficiency anemia 2/2 sm bowel AVMS s/p endoscopic treatemnt, hidradenitis suppurativa, and Hashimoto's disease, who presents for follow-up of Takotsubo cardiomyopathy and shortness of breath.   Past Medical History    Past Medical History:  Diagnosis Date  . CAD (coronary artery disease)    a. 2017 s/p prior MI & PCI RCA ([place]); b. 05/2020 Abnl ETT: 1.5-71mm horizontal ST dep in inflat leads @ peak stress w/ freq PVCs/couplets/bigeminy. HTN response; b. 05/2020 NSTEMI/Cath: LM nl, LAD 14m, LCX 20, RCA 35ost, patent prev placed prox-dist stent. EF 25-35% w/ apical ballooning.  Marland Kitchen Hashimoto's disease   . Hidradenitis suppurativa   . Hydradenitis   . Hyperparathyroidism ([place])   . Hypertension   . Myocardial infarction ([place]) 2017  . Pericardial effusion    a. 05/2020 Echo: sm to mod circumferential peric eff w/o tamponade.  . Takotsubo cardiomyopathy    a. 05/2020 Echo: EF 35-40%, glob HK, though basal wall motion best preserved consistent w/ stress induced CM. Gr1 DD. Nl RV size/fxn. Sm to mod circumferential pericardial effusion w/o tamponade.   Past Surgical History:  Procedure Laterality Date  . BREAST BIOPSY Left 1993   neg  . CARDIAC CATHETERIZATION    . CYST EXCISION  x2  . LEFT HEART CATH AND CORONARY ANGIOGRAPHY N/A 05/31/2020   Procedure: LEFT HEART CATH AND CORONARY ANGIOGRAPHY;  Surgeon: Nelva Bush, MD;  Location: [facility];  Service: Cardiovascular;  Laterality: N/A;  . PARATHYROIDECTOMY    . TONSILLECTOMY      Allergies  Allergies  Allergen Reactions  . Humira [Adalimumab] Rash    History of Present Illness   59 y/o female with the above past  medical history of CAD/NSTEMI, Takotsubo cardiomyopathy, hypertension, hyperlipidemia, PAD, iron deficiency anemia 2/2 sm bowel AVMS s/p endoscopic treatemnt, hidradenitis suppurativa, and Hashimoto's disease. Cardiac history dates back to 2017 in the setting of MI (DES x3 RCA, complicated ny RV failure and cardiogenic shock requiring IABP). She established care with our clinic earlier this year and was doing well up until 05/24/2020 when she presented to the clinic with complaints of chest tightness on exertion. She had an abnormal ETT on 05/30/2020 with ST depression in the inferolateral leads, and frequent PVCs during recovery, though she was asymptomatic. She was also hypertensive during the test. Recommendations were made for follow-up cardiac catheterization, however, she developed chest pain which radiated to her upper back, while driving home from her ETT. She reported to [facility] ED later that day. 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 HsTroponin elevated at 6301. She underwent cardiac catheterization on 04/30/2020, in the setting of NSTEMI, which showed a patent RCA stent, mild nonobstructive disease to the LAD, LCx and RCA, and an LVEF of 25-35%, with apical ballooning consistent with Takotsubo cardiomyopathy. Echo showed an EF of 35-40%, LV global hypokineses with preserved basal wall motion, consistent w/ stress cardiomyopathy, grade I diastolic dysfunction, and an LVEDP of 25-30 mmHg. She responded well to diuresis, however, her home dose of amlodipine was discontinued in the setting of soft blood pressures. She was discharged home on 06/01/2020 on lasix 20 mg  daily, losartan 25 mg daily, toprol xl 125 mg daily, brilinta 60 mg bid, and atrovastatin 80 mg daily.   At her hospital follow-up visit on 06/07/2020, she reported ongoing shortness of breath with exertion, mild fatigue and occasional  symptomatic orthostatic hypotension, with slow improvement. No changes were made to her medication, as her symptoms of orthostasis seemed to be improving, and to allow for continued myocardial recovery with medical therapy. However, follow-up creatine was slighlty elevated at 1.11. In the setting of hypotension and elevated creatinine, her lasix was decreased to 10 mg daily. She was also referred for ABIs in the setting of new PAD, which showed  > 50 stenoses in bil iliac arteries. Pressures in legs normal. Referred to Dr. Fletcher Anon.  Unfortunately, she returned to the office on 06/12/20 with complaints of PND/orthopnea and abdominal distention following a reduction in her lasix dose. She was instructed to take 30 mg lasix x1 and 40 mg x1 the following day, then return to her dose of 20 mg daily, with an additional 20 mg daily as needed for shortness of breath or abdominal distention.   Since her last visit she has been doing well. She has increased her activity and is now walking 9,000 steps a day with no complaints. She is taking all of her medication as prescribed and tolerating her current regimen with improved blood pressures. She reports mild fatigue, though improved since her hospitalization. She denies chest pain, palpitations, dyspnea, pnd, orthopnea, n, v, dizziness, syncope, edema, weight gain, or early satiety. She is asking to possibly defer her appointment with Dr. Fletcher Anon for PAD, given she is asymptomatic with daily exercise.   Home Medications    Prior to Admission medications   Medication Sig Start Date End Date Taking? Authorizing Provider  aspirin 81 MG chewable tablet Chew 1 tablet (81 mg total) by mouth daily. 06/20/20   Cannady, Henrine Screws T, NP  atorvastatin (LIPITOR) 80 MG tablet Take 1 tablet (80 mg total) by mouth daily. 06/20/20   Marnee Guarneri T, NP  conjugated estrogens (PREMARIN) vaginal cream Place 0.5 grams twice weekly intravaginally. 04/24/20   Cannady, Henrine Screws T, NP  furosemide  (LASIX) 20 MG tablet Take 1 tablet (20 mg total) by mouth as directed. Take once daily and the PM pill to be taken as needed for shortness of breath or swelling 06/12/20   Minna Merritts, MD  levothyroxine (SYNTHROID) 75 MCG tablet 1 tablet daily 6 days per week    [provider]  losartan (COZAAR) 25 MG tablet Take 0.5 tablets (12.5 mg total) by mouth daily. 06/20/20   Cannady, Henrine Screws T, NP  metoprolol succinate (TOPROL-XL) 25 MG 24 hr tablet Take 0.5 tablets (12.5 mg total) by mouth daily. 06/20/20   Cannady, Henrine Screws T, NP  metroNIDAZOLE (FLAGYL) 500 MG tablet Take 1 tablet (500 mg total) by mouth 2 (two) times daily. 07/10/20   Cannady, Henrine Screws T, NP  nitroGLYCERIN (NITROSTAT) 0.4 MG SL tablet Place 1 tablet (0.4 mg total) under the tongue every 5 (five) minutes as needed for chest pain. 11/05/19 06/12/20  End, Harrell Gave, MD  pantoprazole (PROTONIX) 40 MG tablet Take 1 tablet (40 mg total) by mouth daily. 11/05/19   Cannady, Henrine Screws T, NP  ticagrelor (BRILINTA) 60 MG TABS tablet Take 1 tablet (60 mg total) by mouth 2 (two) times daily. 11/05/19   Marnee Guarneri T, NP   Review of Systems    Mild fatigue. She denies chest pain, palpitations, dyspnea, pnd, orthopnea, n, v, dizziness,  syncope, edema, weight gain, or early satiety. All other systems reviewed and are otherwise negative except as noted above.  Physical Exam    VS:  BP 134/80 (BP Location: Left Arm, Patient Position: Sitting, Cuff Size: Normal)   Pulse 73   Ht 5\' 6"  (1.676 m)   Wt 143 lb (64.9 kg)   LMP 11/16/2007 (Approximate)   SpO2 98%   BMI 23.08 kg/m   GEN: Well nourished, well developed, in no acute distress. HEENT: normal. Neck: Supple, no JVD, carotid bruits, or masses. Cardiac: RRR, no murmurs, rubs, or gallops. No clubbing, cyanosis, edema.  PT 2+ and equal bilaterally.  Respiratory:  Respirations regular and unlabored, clear to auscultation bilaterally. GI: Soft, nontender, nondistended, BS + x 4. MS: no  deformity or atrophy. Skin: warm and dry, no rash. Neuro:  Strength and sensation are intact. Psych: Normal affect.  Accessory Clinical Findings    ECG not performed today.  Lab Results  Component Value Date   WBC 7.1 06/01/2020   HGB 12.3 06/01/2020   HCT 35.4 (L) 06/01/2020   MCV 94.7 06/01/2020   PLT 353 06/01/2020   Lab Results  Component Value Date   CREATININE 1.11 (H) 06/20/2020   BUN 21 06/20/2020   NA 140 06/20/2020   K 4.0 06/20/2020   CL 100 06/20/2020   CO2 28 06/20/2020   Lab Results  Component Value Date   ALT 20 05/30/2020   AST 33 05/30/2020   ALKPHOS 34 (L) 05/30/2020   BILITOT 0.7 05/30/2020   Lab Results  Component Value Date   CHOL 133 06/20/2020   HDL 53 06/20/2020   LDLCALC 57 06/20/2020   TRIG 131 06/20/2020     Assessment & Plan    1.  CAD/NSTEMI (05/2020): H/o CAD s/p MI and RCA stenting x3 in 2017 in [place]. Recurrent c/p 05/2020 prompting ETT, which was abnl. She developed c/p while driving home after ETT after which she presented to the ED where HsTrop was elevated at 6301. Cath revealed nonobs disease w/ depressed EF and apical ballooning consistent with Takotsubo. Stable with steady improvement post hospital discharge. BP initially soft on medical therapy, now improved. She is not on ASA due to history of IDA in the setting of AVMs. Continue current medical therapy with brilinta 60 mg bid indefinitely, high intensity statin, ARB, beta-blocker and lasix 20 mg daily. Follow-up in 3 months.   2. Takotsubo cardiomyopathy/HFrEF: Stable on medical therapy. Lasix dose reduced in the setting of elevated creatinine (1.11) and hypotension, however, she developed orthopnea/PND/abdominal distention, and was seen in the office on 06/12/20. Given her symptoms, her lasix dose was once again increased to 20 mg daily with instructions to take an additional 20 mg daily as needed for shortness of breath, abdominal distention, or weight gain. Her symptoms have since  resolved. Repeat creatine stable at 1.11 on 06/20/20. BP also improved. She does report mild fatigue, however her activity tolerance has improved significantly and she is walking 9,000 steps a day. Continue current medical therapy with ARB, beta-blocker and lasix 20 mg daily. Repeat echo in 1 month.   3. Essential hypertension: Previously hypotensive post hospital discharge. BP now stable on current medical therapy. Continue current medical therapy and continue to monitor blood pressure at home.   4. Hyperlipidemia:  LDL 57 on 06/20/20. Continue high intensity statin in the setting of CAD.   5. PAD:  Incidental finding on CTA during recent hospital visit with stenosis to the proximal celiac axis  and internal iliac arteries. Follow-up ABIs showed  > 50 stenoses in iliac arteries, pressures in legs were normal. Follow-up with Dr. Fletcher Anon recommended. However, given she is currently asymptomatic with daily exercise and on brilinta 60 mg bid indefinitely, she would like to delay this visit for now. We will cancel her upcoming appointment with Dr. Fletcher Anon per her request. She will follow-up with Dr. Saunders Revel in 3 months.  If she has symptoms, she will discuss with him at that time.   6. Disposition: Repeat echo. Follow-up in office in 3 months.     Murray Hodgkins, NP 07/12/2020, 1:04 PM

## 2020-07-17 ENCOUNTER — Ambulatory Visit: Payer: No Typology Code available for payment source | Admitting: Nurse Practitioner

## 2020-08-09 ENCOUNTER — Ambulatory Visit (INDEPENDENT_AMBULATORY_CARE_PROVIDER_SITE_OTHER): Payer: No Typology Code available for payment source

## 2020-08-09 ENCOUNTER — Other Ambulatory Visit: Payer: Self-pay

## 2020-08-09 DIAGNOSIS — I428 Other cardiomyopathies: Secondary | ICD-10-CM | POA: Diagnosis not present

## 2020-08-09 LAB — ECHOCARDIOGRAM COMPLETE
Area-P 1/2: 3.53 cm2
Calc EF: 65.4 %
S' Lateral: 2.6 cm
Single Plane A2C EF: 64.7 %
Single Plane A4C EF: 65.5 %

## 2020-08-14 ENCOUNTER — Telehealth: Payer: Self-pay | Admitting: *Deleted

## 2020-08-14 DIAGNOSIS — I25118 Atherosclerotic heart disease of native coronary artery with other forms of angina pectoris: Secondary | ICD-10-CM

## 2020-08-14 DIAGNOSIS — I3139 Other pericardial effusion (noninflammatory): Secondary | ICD-10-CM

## 2020-08-14 NOTE — Telephone Encounter (Addendum)
Spoke with patient and reviewed results and recommendations with patient. She verbalized understanding of our conversation, order to be placed, and that I would have someone call to set up follow up echo. She was appreciative for the call with no further questions at this time.

## 2020-08-14 NOTE — Telephone Encounter (Signed)
-----   Message from Theora Gianotti, NP sent at 08/11/2020 10:46 AM EST ----- Heart squeezing function is now normal (was 35-40%, now 60-65%). There is still a moderate amount of fluid surrounding the heart.  We'd like to follow this with a limited echo in 1 month to ensure stability.

## 2020-08-15 ENCOUNTER — Ambulatory Visit: Payer: No Typology Code available for payment source | Admitting: Cardiovascular Disease

## 2020-08-29 ENCOUNTER — Ambulatory Visit: Payer: No Typology Code available for payment source | Admitting: Cardiovascular Disease

## 2020-09-12 ENCOUNTER — Telehealth: Payer: Self-pay

## 2020-09-12 NOTE — Telephone Encounter (Signed)
Called patient to confirm her ECHO appt tomorrow (09/13/2020).   Patient states that she has had a sore throat since yesterday. Patient wasn't sure if she needed to cancel appt or come in. Patient also denied all other symptoms.   Messaged [place]  Public affairs consultant) Via secure Chat to confirm COVID symptom procedures.  Harriett Sine Stated  "If not urgent might be good idea to reschedule until she doesnt have sore throat."   "Can we just verify with an App or RN that it can be put off for a little bit.and have the nurse document it. Just in case it was ever brought up again, also might be good idea to have RN call patient to say if anything changes and they feel worse to call us back."

## 2020-09-12 NOTE — Telephone Encounter (Signed)
Spoke with patient and she reports fatigue in addition to sore throat now and she just wants to be safe. She does not want to expose anyone and given that her neighbor passed from COVID she does not want to take any chance. Rescheduled her echocardiogram for now and advised to let us know if we need to push her appointments out any further. She verbalized understanding, was appreciative for the call, and had no further questions at this time.

## 2020-09-13 ENCOUNTER — Other Ambulatory Visit: Payer: No Typology Code available for payment source

## 2020-09-17 ENCOUNTER — Other Ambulatory Visit: Payer: Self-pay | Admitting: Nurse Practitioner

## 2020-09-22 ENCOUNTER — Other Ambulatory Visit: Payer: Self-pay

## 2020-09-22 ENCOUNTER — Ambulatory Visit: Payer: No Typology Code available for payment source | Admitting: Nurse Practitioner

## 2020-09-22 ENCOUNTER — Encounter: Payer: Self-pay | Admitting: Nurse Practitioner

## 2020-09-22 VITALS — BP 130/68 | HR 80 | Temp 98.5°F | Ht 66.58 in | Wt 148.4 lb

## 2020-09-22 DIAGNOSIS — I428 Other cardiomyopathies: Secondary | ICD-10-CM

## 2020-09-22 DIAGNOSIS — E892 Postprocedural hypoparathyroidism: Secondary | ICD-10-CM | POA: Diagnosis not present

## 2020-09-22 DIAGNOSIS — E063 Autoimmune thyroiditis: Secondary | ICD-10-CM

## 2020-09-22 DIAGNOSIS — E785 Hyperlipidemia, unspecified: Secondary | ICD-10-CM

## 2020-09-22 DIAGNOSIS — I739 Peripheral vascular disease, unspecified: Secondary | ICD-10-CM | POA: Diagnosis not present

## 2020-09-22 DIAGNOSIS — I25118 Atherosclerotic heart disease of native coronary artery with other forms of angina pectoris: Secondary | ICD-10-CM | POA: Diagnosis not present

## 2020-09-22 DIAGNOSIS — I10 Essential (primary) hypertension: Secondary | ICD-10-CM

## 2020-09-22 NOTE — Assessment & Plan Note (Signed)
Chronic, ongoing.  Continue current medication regimen and adjust as needed.  Thyroid labs today. 

## 2020-09-22 NOTE — Assessment & Plan Note (Signed)
History of removal in 2018 and has been stable since, continue to monitor. 

## 2020-09-22 NOTE — Assessment & Plan Note (Signed)
Ongoing, followed by cardiology.  Continue current medication regimen and collaboration with cardiology. 

## 2020-09-22 NOTE — Assessment & Plan Note (Addendum)
Stable, followed by cardiology.  Continue current medication regimen and collaboration with cardiology. 

## 2020-09-22 NOTE — Assessment & Plan Note (Signed)
Chronic, ongoing with history of MI.  Continue statin and adjust dose as needed.  If difficulty with increase in Atorvastatin, could consider switch to Rosuvastatin.  Discussed with patient and she will alert provider if ongoing leg cramp issues. 

## 2020-09-22 NOTE — Patient Instructions (Signed)
Chronic Kidney Disease, Adult Chronic kidney disease (CKD) happens when the kidneys are damaged over a long period of time. The kidneys are two organs that help with:  Getting rid of waste and extra fluid from the blood.  Making hormones that maintain the amount of fluid in your tissues and blood vessels.  Making sure that the body has the right amount of fluids and chemicals. Most of the time, CKD does not go away, but it can usually be controlled. Steps must be taken to slow down the kidney damage or to stop it from getting worse. If this is not done, the kidneys may stop working. Follow these instructions at home: Medicines  Take over-the-counter and prescription medicines only as told by your doctor. You may need to change the amount of medicines you take.  Do not take any new medicines unless your doctor says it is okay. Many medicines can make your kidney damage worse.  Do not take any vitamin and supplements unless your doctor says it is okay. Many vitamins and supplements can make your kidney damage worse. General instructions  Follow a diet as told by your doctor. You may need to stay away from: ? Alcohol. ? Salty foods. ? Foods that are high in:  Potassium.  Calcium.  Protein.  Do not use any products that contain nicotine or tobacco, such as cigarettes and e-cigarettes. If you need help quitting, ask your doctor.  Keep track of your blood pressure at home. Tell your doctor about any changes.  If you have diabetes, keep track of your blood sugar as told by your doctor.  Try to stay at a healthy weight. If you need help, ask your doctor.  Exercise at least 30 minutes a day, 5 days a week.  Stay up-to-date with your shots (immunizations) as told by your doctor.  Keep all follow-up visits as told by your doctor. This is important. Contact a doctor if:  Your symptoms get worse.  You have new symptoms. Get help right away if:  You have symptoms of end-stage  kidney disease. These may include: ? Headaches. ? Numbness in your hands or feet. ? Easy bruising. ? Having hiccups often. ? Chest pain. ? Shortness of breath. ? Stopping of menstrual periods in women.  You have a fever.  You have very little pee (urine).  You have pain or bleeding when you pee. Summary  Chronic kidney disease (CKD) happens when the kidneys are damaged over a long period of time.  Most of the time, this condition does not go away, but it can usually be controlled. Steps must be taken to slow down the kidney damage or to stop it from getting worse.  Treatment may include a combination of medicines and lifestyle changes. This information is not intended to replace advice given to you by your health care provider. Make sure you discuss any questions you have with your health care provider. Document Revised: 08/15/2017 Document Reviewed: 10/07/2016 Elsevier Patient Education  2020 [employer]  

## 2020-09-22 NOTE — Assessment & Plan Note (Signed)
Chronic, ongoing with BP well below goal at this time. Will continue current medication regimen and adjust as needed.  Continue collaboration with cardiology due to her significant family and personal cardiac history.  Recommend she monitor BP at least a few mornings a week at home and document.  DASH diet at home. Labs today.  Return in 6 months.

## 2020-09-22 NOTE — Assessment & Plan Note (Signed)
Continue Ticagrelor, ASA, and Atorvastatin for prevention + collaboration with cardiology.  High risk family and patient history.

## 2020-09-22 NOTE — Progress Notes (Signed)
BP 130/68   Pulse 80   Temp 98.5 F (36.9 C) (Oral)   Ht 5' 6.58" (1.691 m)   Wt 148 lb 6.4 oz (67.3 kg)   LMP 11/16/2007 (Approximate)   SpO2 99%   BMI 23.54 kg/m    Subjective:    Patient ID: April Raymond, female    DOB: 28-Dec-1960, 60 y.o.   MRN: [id number]  HPI: April Raymond is a 60 y.o. female  Chief Complaint  Patient presents with  . Hyperlipidemia  . thyroid  . Stable angina   HYPERTENSIONWITH CAD & HLD Followed by cardiology with last visit 07/12/20, stress-induced cardiomyopathy and angina -- in 2 weeks has visit and repeat echo scheduled.  Had MI at age 34. Has a family history of MI, mother in her 47's and her father's dad passed in his 61's. Had parathyroid removed in 2018, history of hyperparathyroid. She continues on Ticagrelor, Metoprolol, Losartan, Lasix daily and PRN (most often taking second pill), and NTG.  Has not had to use any NTG and is beginning to exercise again.  Echo on 05/31/20 -- 35-40% EF. Taking Atorvastatin 80 MG, has had a few leg cramps with this.   Hypertension status:stable Satisfied with current treatment?yes Duration of hypertension:chronic BP monitoring frequency:daily BP range:<120/70 at home on average  BP medication side effects:no Medication compliance:good compliance Aspirin:ticagrelor, ASA Recurrent headaches:no Visual changes:no Palpitations:no Dyspnea:mild at times Chest pain:none recently Lower extremity edema:no Dizzy/lightheaded:no The ASCVD Risk score Mikey Bussing DC Jr., et al., 2013) failed to calculate for the following reasons:   The patient has a prior MI or stroke diagnosis   CHRONIC KIDNEY DISEASE Noted on past labs with GFR 54 and CRT 1.11. CKD status: stable Medications renally dose: yes Previous renal evaluation: no Pneumovax:  Not up to Date Influenza Vaccine:  Up to Date  HYPOTHYROIDISM Continues on Levothyroxine 75 MCG daily.  Last TSH in September 3.463. Thyroid control  status:stable Satisfied with current treatment? yes Medication side effects: no Medication compliance: good compliance Etiology of hypothyroidism:  Recent dose adjustment:no Fatigue: no Cold intolerance: yes Heat intolerance: no Weight gain: no Weight loss: no Constipation: no Diarrhea/loose stools: sometimes Palpitations: no Lower extremity edema: no Anxiety/depressed mood: no  Relevant past medical, surgical, family and social history reviewed and updated as indicated. Interim medical history since our last visit reviewed. Allergies and medications reviewed and updated.  Review of Systems  Constitutional: Negative for activity change, appetite change, diaphoresis, fatigue and fever.  Respiratory: Negative for cough, chest tightness, shortness of breath and wheezing.   Cardiovascular: Negative for chest pain, palpitations and leg swelling.  Gastrointestinal: Negative.   Endocrine: Negative for cold intolerance and heat intolerance.  Neurological: Negative.   Psychiatric/Behavioral: Negative.     Per HPI unless specifically indicated above     Objective:    BP 130/68   Pulse 80   Temp 98.5 F (36.9 C) (Oral)   Ht 5' 6.58" (1.691 m)   Wt 148 lb 6.4 oz (67.3 kg)   LMP 11/16/2007 (Approximate)   SpO2 99%   BMI 23.54 kg/m   Wt Readings from Last 3 Encounters:  09/22/20 148 lb 6.4 oz (67.3 kg)  07/12/20 143 lb (64.9 kg)  07/10/20 142 lb (64.4 kg)    Physical Exam Vitals and nursing note reviewed.  Constitutional:      General: She is awake. She is not in acute distress.    Appearance: She is well-developed and well-groomed. She is not ill-appearing.  HENT:  Head: Normocephalic.     Right Ear: Hearing normal.     Left Ear: Hearing normal.  Eyes:     General: Lids are normal.        Right eye: No discharge.        Left eye: No discharge.     Conjunctiva/sclera: Conjunctivae normal.     Pupils: Pupils are equal, round, and reactive to light.   Cardiovascular:     Rate and Rhythm: Normal rate and regular rhythm.     Heart sounds: Normal heart sounds. No murmur heard. No gallop.   Pulmonary:     Effort: Pulmonary effort is normal. No accessory muscle usage or respiratory distress.     Breath sounds: Normal breath sounds.  Abdominal:     General: Bowel sounds are normal.     Palpations: Abdomen is soft.  Musculoskeletal:     Cervical back: Normal range of motion and neck supple.     Right lower leg: No edema.     Left lower leg: No edema.  Skin:    General: Skin is warm and dry.  Neurological:     Mental Status: She is alert and oriented to person, place, and time.  Psychiatric:        Attention and Perception: Attention normal.        Mood and Affect: Mood normal.        Speech: Speech normal.        Behavior: Behavior normal. Behavior is cooperative.        Thought Content: Thought content normal.    Results for orders placed or performed in visit on 08/09/20  ECHOCARDIOGRAM COMPLETE  Result Value Ref Range   S' Lateral 2.60 cm   Area-P 1/2 3.53 cm2   Single Plane A4C EF 65.5 %   Single Plane A2C EF 64.7 %   Calc EF 65.4 %      Assessment & Plan:   Problem List Items Addressed This Visit      Cardiovascular and Mediastinum   Coronary artery disease of native artery of native heart with stable angina pectoris ([place]) - Primary    Continue Ticagrelor, ASA, and Atorvastatin for prevention + collaboration with cardiology.  High risk family and patient history.      Essential hypertension    Chronic, ongoing with BP well below goal at this time. Will continue current medication regimen and adjust as needed.  Continue collaboration with cardiology due to her significant family and personal cardiac history.  Recommend she monitor BP at least a few mornings a week at home and document.  DASH diet at home. Labs today.  Return in 6 months.       Relevant Orders   Basic metabolic panel   Nonischemic cardiomyopathy  ([place])    Stable, followed by cardiology.  Continue current medication regimen and collaboration with cardiology.      PAD (peripheral artery disease) ([place])    Ongoing, followed by cardiology.  Continue current medication regimen and collaboration with cardiology.        Endocrine   Hashimoto's disease    Chronic, ongoing.  Continue current medication regimen and adjust as needed.  Thyroid labs today.      Relevant Orders   TSH   T4, free     Other   History of parathyroidectomy ([place])    History of removal in 2018 and has been stable since, continue to monitor.      Hyperlipidemia LDL goal <70  Chronic, ongoing with history of MI.  Continue statin and adjust dose as needed.  If difficulty with increase in Atorvastatin, could consider switch to Rosuvastatin.  Discussed with patient and she will alert provider if ongoing leg cramp issues.      Relevant Orders   Lipid Panel w/o Chol/HDL Ratio       Follow up plan: Return in about 6 months (around 03/22/2021) for HTN/HLD, Thyroid, CKD.

## 2020-09-23 LAB — BASIC METABOLIC PANEL
BUN/Creatinine Ratio: 22 (ref 12–28)
BUN: 25 mg/dL (ref 8–27)
CO2: 24 mmol/L (ref 20–29)
Calcium: 9.5 mg/dL (ref 8.7–10.3)
Chloride: 102 mmol/L (ref 96–106)
Creatinine, Ser: 1.16 mg/dL — ABNORMAL HIGH (ref 0.57–1.00)
GFR calc Af Amer: 59 mL/min/{1.73_m2} — ABNORMAL LOW (ref >59)
GFR calc non Af Amer: 51 mL/min/{1.73_m2} — ABNORMAL LOW (ref >59)
Glucose: 90 mg/dL (ref 65–99)
Potassium: 3.9 mmol/L (ref 3.5–5.2)
Sodium: 141 mmol/L (ref 134–144)

## 2020-09-23 LAB — LIPID PANEL W/O CHOL/HDL RATIO
Cholesterol, Total: 146 mg/dL (ref 100–199)
HDL: 66 mg/dL (ref >39)
LDL Chol Calc (NIH): 57 mg/dL (ref 0–99)
Triglycerides: 135 mg/dL (ref 0–149)
VLDL Cholesterol Cal: 23 mg/dL (ref 5–40)

## 2020-09-23 LAB — TSH: TSH: 3.33 u[IU]/mL (ref 0.450–4.500)

## 2020-09-23 LAB — T4, FREE: Free T4: 1.29 ng/dL (ref 0.82–1.77)

## 2020-09-23 NOTE — Progress Notes (Signed)
Contacted via MyChart   Good evening April Raymond, your labs have returned.  Kidney function continues to show some mild kidney disease that is remaining stable, we will continue to monitor closely since you are taking Lasix.  Cholesterol levels are at goal.  Thyroid labs are normal, continue current dosing of medication.  Have a great evening. Keep being awesome!!  Thank you for allowing me to participate in your care. Kindest regards, Will Heinkel

## 2020-10-05 ENCOUNTER — Ambulatory Visit (INDEPENDENT_AMBULATORY_CARE_PROVIDER_SITE_OTHER): Payer: No Typology Code available for payment source

## 2020-10-05 ENCOUNTER — Other Ambulatory Visit: Payer: Self-pay

## 2020-10-05 DIAGNOSIS — I3139 Other pericardial effusion (noninflammatory): Secondary | ICD-10-CM

## 2020-10-05 DIAGNOSIS — I313 Pericardial effusion (noninflammatory): Secondary | ICD-10-CM

## 2020-10-06 ENCOUNTER — Ambulatory Visit: Payer: No Typology Code available for payment source | Admitting: Nurse Practitioner

## 2020-10-06 ENCOUNTER — Encounter: Payer: Self-pay | Admitting: Nurse Practitioner

## 2020-10-06 VITALS — BP 110/60 | HR 68 | Ht 66.0 in | Wt 148.0 lb

## 2020-10-06 DIAGNOSIS — E785 Hyperlipidemia, unspecified: Secondary | ICD-10-CM

## 2020-10-06 DIAGNOSIS — I2510 Atherosclerotic heart disease of native coronary artery without angina pectoris: Secondary | ICD-10-CM

## 2020-10-06 DIAGNOSIS — R0600 Dyspnea, unspecified: Secondary | ICD-10-CM

## 2020-10-06 DIAGNOSIS — I5181 Takotsubo syndrome: Secondary | ICD-10-CM

## 2020-10-06 DIAGNOSIS — I428 Other cardiomyopathies: Secondary | ICD-10-CM | POA: Diagnosis not present

## 2020-10-06 DIAGNOSIS — I3139 Other pericardial effusion (noninflammatory): Secondary | ICD-10-CM

## 2020-10-06 DIAGNOSIS — I10 Essential (primary) hypertension: Secondary | ICD-10-CM

## 2020-10-06 DIAGNOSIS — I313 Pericardial effusion (noninflammatory): Secondary | ICD-10-CM

## 2020-10-06 DIAGNOSIS — I739 Peripheral vascular disease, unspecified: Secondary | ICD-10-CM

## 2020-10-06 LAB — ECHOCARDIOGRAM LIMITED
Calc EF: 55.8 %
S' Lateral: 3.1 cm
Single Plane A2C EF: 55.6 %
Single Plane A4C EF: 55.2 %

## 2020-10-06 NOTE — Patient Instructions (Addendum)
Medication Instructions:  Your physician has recommended you make the following change in your medication:  1. STOP Aspirin   *If you need a refill on your cardiac medications before your next appointment, please call your pharmacy*   Lab Work: None  If you have labs (blood work) drawn today and your tests are completely normal, you will receive your results only by:  [facility] (if you have MyChart) OR  A paper copy in the mail If you have any lab test that is abnormal or we need to change your treatment, we will call you to review the results.   Testing/Procedures: Pulmonary Function test ordered. Please see additional instructions on this.   The day prior to your pulmonary test you will require COVID testing. Go to the [facility] building between 08:00 AM and 1:00 PM to have that done.     Follow-Up: At [facility], you and your health needs are our priority.  As part of our continuing mission to provide you with exceptional heart care, we have created designated Provider Care Teams.  These Care Teams include your primary Cardiologist (physician) and Advanced Practice Providers (APPs -  Physician Assistants and Nurse Practitioners) who all work together to provide you with the care you need, when you need it.    Your next appointment:   6 month(s)  The format for your next appointment:   In Person  Provider:   Nelva Bush, MD or Murray Hodgkins, NP

## 2020-10-06 NOTE — Progress Notes (Signed)
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   Past Medical History    Past Medical History:  Diagnosis Date  . CAD (coronary artery disease)    a. 2017 s/p prior MI & PCI RCA ([place]); b. 05/2020 Abnl ETT: 1.5-47mm horizontal ST dep in inflat leads @ peak stress w/ freq PVCs/couplets/bigeminy. HTN response; b. 05/2020 NSTEMI/Cath: LM nl, LAD 74m, LCX 20, RCA 35ost, patent prev placed prox-dist stent. EF 25-35% w/ apical ballooning.  Marland Kitchen Hashimoto's disease   . Hidradenitis suppurativa   . Hydradenitis   . Hyperparathyroidism ([place])   . Hypertension   . Myocardial infarction ([place]) 2017  . Pericardial effusion    a. 05/2020 Echo: sm to mod circumferential peric eff w/o tamponade; b. 07/2020 Mod peric effusion; c. 09/2020 Echo: EF 60-65%, no rwma, small tomod effusion w/o compromise.  . Takotsubo cardiomyopathy    a. 05/2020 Echo: EF 35-40%, glob HK, though basal wall motion best preserved consistent w/ stress induced CM. Gr1 DD. Nl RV size/fxn. Sm to mod circumferential pericardial effusion w/o tamponade.   Past Surgical History:  Procedure Laterality Date  . BREAST BIOPSY Left 1993   neg  . CARDIAC CATHETERIZATION    . CYST EXCISION  x2  . LEFT HEART CATH AND CORONARY ANGIOGRAPHY N/A 05/31/2020   Procedure: LEFT HEART CATH AND CORONARY ANGIOGRAPHY;  Surgeon: Nelva Bush, MD;  Location: [facility];  Service: Cardiovascular;  Laterality: N/A;  . PARATHYROIDECTOMY    .  TONSILLECTOMY      Allergies  Allergies  Allergen Reactions  . Humira [Adalimumab] Rash    History of Present Illness   60 y/o female with history of CAD/NSTEMI, Takotsubo cardiomyopathy, HTN, hyperlipidemia, PAD, iron deficiency anemia 2/2 sm bowel AVMs, and Hashimoto's disease. MI 2017 (DES x3 RCA, complicated ny RV failure and cardiogenic shock requiring IABP), treated in [place]. She established care with our clinic earlier this year and was doing well up until 05/24/2020 when she presented to the clinic with complaints of chest tightness on exertion. She had an abnormal ETT and recommendations were made for follow-up cardiac catheterization, however, she developed chest pain while driving home from her ETT. She reported to [facility] ED later that day. CT neg for dissection; Troponin elevated at 6301. She underwent cardiac catheterization on 05/31/2020, in the setting of NSTEMI, with apical ballooning consistent with Takotsubo cardiomyopathy. Echo showed an EF of 35-40%.     At hospital follow-up visit on 06/07/2020, she reported ongoing SOB with exertion, mild fatigue and occasional symptomatic orthostatic hypotension, with slow improvement. No changes were made to her medication, however, follow-up creatine was slighlty elevated at 1.11. In the setting of hypotension and elevated creatinine, her lasix was decreased to 10 mg daily. She was also referred for ABIs in the setting of new PAD (found on CT 9/21), which showed  > 50 stenoses in bil iliac arteries. Pressures in legs normal. Referred to Dr. Fletcher Anon.  She returned to the office on 06/12/20 with complaints of PND/orthopnea and abdominal distention following a reduction in her  lasix dose. She was instructed to take 30 mg lasix x1 and 40 mg x1 the following day, then return to her dose of 20 mg daily, with an additional 20 mg daily as needed for shortness of breath or abdominal distention.   At follow-up on 10/27, she was doing well with only mild  fatigue. EF improved to 60-65% and she had increased her activity to walk 9000 steps daily without difficulty. She chose to cancel her appointment with Dr. Fletcher Anon for management of PAD due to no symptoms.   She returns today for follow-up following repeat limited 2D echo done yesterday. EF remains normal at 60-65%. She continues to experience SOB in the early morning after waking up which resolves after being awake and active for a few hours. Reports symptoms more noticeable after a hot shower or when walking her dog if dog attempts to walk at a faster pace; denies symptoms later in the day. She denies upper extremity or abdominal swelling; notes legs look a little swollen sometimes in the evening. Reports weight gain of 5 lbs since Sept but does not notice trend of weight gain on a day to day basis. She has been taking additional lasix 20 mg almost daily (for dyspnea in the AM) as advised by cards in the past. Does not note definite improvement in sob with the additional lasix. Former smoker 40 pack year hx with no previous pulmonary testing. Has taken brilinta 60 mg bid since 2018 as advised by cards in [place]. She was advised by our group to restart aspirin Sept 21.   Home Medications    Prior to Admission medications   Medication Sig Start Date End Date Taking? Authorizing Provider  aspirin 81 MG chewable tablet Chew 1 tablet (81 mg total) by mouth daily. 06/20/20   Cannady, Henrine Screws T, NP  atorvastatin (LIPITOR) 80 MG tablet Take 1 tablet (80 mg total) by mouth daily. 06/20/20   Marnee Guarneri T, NP  conjugated estrogens (PREMARIN) vaginal cream Place 0.5 grams twice weekly intravaginally. 04/24/20   Cannady, Henrine Screws T, NP  furosemide (LASIX) 20 MG tablet Take 1 tablet (20 mg total) by mouth as directed. Take once daily and the PM pill to be taken as needed for shortness of breath or swelling 06/12/20   Minna Merritts, MD  levothyroxine (SYNTHROID) 75 MCG tablet 1 tablet daily 6 days per week    [provider]  losartan (COZAAR) 25 MG tablet Take 0.5 tablets (12.5 mg total) by mouth daily. 06/20/20   Cannady, Henrine Screws T, NP  metoprolol succinate (TOPROL-XL) 25 MG 24 hr tablet TAKE 1/2 TABLET BY MOUTH EVERY DAY 09/17/20   Cannady, Henrine Screws T, NP  nitroGLYCERIN (NITROSTAT) 0.4 MG SL tablet Place 1 tablet (0.4 mg total) under the tongue every 5 (five) minutes as needed for chest pain. 11/05/19 07/12/29  End, Harrell Gave, MD  pantoprazole (PROTONIX) 40 MG tablet Take 1 tablet (40 mg total) by mouth daily. 11/05/19   Cannady, Henrine Screws T, NP  ticagrelor (BRILINTA) 60 MG TABS tablet Take 1 tablet (60 mg total) by mouth 2 (two) times daily. 11/05/19   Marnee Guarneri T, NP    Review of Systems    She continues to have SOB early in the day, resolves after several hours of being awake and active. Denies chest pain, palpitations, pnd, orthopnea, n, v, dizziness, syncope, edema, weight gain, or early satiety.  All other systems reviewed and are otherwise negative except as noted above.  Physical Exam  VS:  Vitals:   10/06/20 1327  BP: 110/60  Pulse: 68  SpO2: 98%    BP 110/60 (BP Location: Left Arm, Patient Position: Sitting, Cuff Size: Normal)   Pulse 68   Ht 5\' 6"  (1.676 m)   Wt 148 lb (67.1 kg)   LMP 11/16/2007 (Approximate)   SpO2 98%   BMI 23.89 kg/m  , Body mass index is 23.89 kg/m. GEN: Well nourished, well developed, in no acute distress. HEENT: normal. Neck: Supple, no JVD, carotid bruits, or masses. Cardiac: RRR, no murmurs, rubs, or gallops. No clubbing, cyanosis, edema.  Radials/DP/PT 2+ and equal bilaterally.  Respiratory:  Respirations regular and unlabored, clear to auscultation bilaterally. GI: Soft, nontender, nondistended, BS + x 4. MS: no deformity or atrophy. Skin: warm and dry, no rash. Small area of ecchymosis to right forearm from unknown injury noted. Neuro:  Strength and sensation are intact. Psych: Normal affect.  Accessory Clinical Findings    ECG personally  reviewed by me today - RSR at rate of 68 bpm - no acute changes from previous ecg.  Lab Results  Component Value Date   WBC 7.1 06/01/2020   HGB 12.3 06/01/2020   HCT 35.4 (L) 06/01/2020   MCV 94.7 06/01/2020   PLT 353 06/01/2020   Lab Results  Component Value Date   CREATININE 1.16 (H) 09/22/2020   BUN 25 09/22/2020   NA 141 09/22/2020   K 3.9 09/22/2020   CL 102 09/22/2020   CO2 24 09/22/2020   Lab Results  Component Value Date   ALT 20 05/30/2020   AST 33 05/30/2020   ALKPHOS 34 (L) 05/30/2020   BILITOT 0.7 05/30/2020   Lab Results  Component Value Date   CHOL 146 09/22/2020   HDL 66 09/22/2020   LDLCALC 57 09/22/2020   TRIG 135 09/22/2020     Assessment & Plan    1.  CAD/NSTEMI: History of CAD s/p MI and DES to the RCA x 3 in [place] in 2017.  Recent cath in the setting of abnl stress test and takotsubo event w/ nonobs dzs.  Stable. Denies chest pain/pressure. Has sob in the mornings which resolves after being active for a few hours. It does not limit her activity and she has no trouble walking at a fast pace several days per week. She has not taken ntg. Question if sob related to brilinta (currently taking 60 bid) ~ advised that in the future we can have her switch to plavix. Advised her to stop aspirin in the setting of easy bruising and past hx of GI bleeding.   2. SOB: Ongoing since 05/2020 stress induced CM (EF now nl).  As above, only really occurring in the early morning hours.  Discussed potential causes of sob since EF has normalized. With hx of smoking and no previous pulmonary testing, offered PFT which pt would like to pursue. She is aware that the test will be scheduled at [facility] and that we will follow-up after completion.   2. Nonischemic cardiomyopathy (Takotsubo CM)/HFrEF: EF 35-40% at time of admission in Sept w/ stress induced CM, but has since recovered (11/21 EF 60-65%). Pt continues to report sob as above. She has taken additional lasix 20 mg almost daily  given with the measure of "if she feels like she needs it." She did not take lasix today.  In an attempt to objectify her volume status/establish a baseline, I placed a ReDS vest today w/ reading of 21%.  I advised that with  this information, we know that @ current weight, she is euvolemic.  Therefor, she should base future additional lasix dosing off of daily wts/wt gain.    3. Pericardial effusion w/o tamponade: Echo 10/05/20 reveals small to mod pleural effusion. Overall stable. No further work-up needed at this time.  4. Essential hypertension: BP stable at 110/60. She reports similar numbers at home, denies dizziness, lightheadedness, or symptoms of orthostasis. Will continue current medications:  losartan 12.5 mg and toprol xl 12.5 mg.   5. Hyperlipidemia: LDL at goal on atorvastatin 80 mg. She c/o occasional bil foot cramps with exercise and wonders if it is s/e of statin. Advised her to try taking statin with heaviest meal of the day for better absorption or we can switch her to rosuvastatin. We could also trial statin holiday x 2 weeks to see if symptoms resolve. She would like to trial taking atorvastatin with dinner.   6. PAD: Stable. Pt denies symptoms, able to walk 9000 or more steps at least 5 days/week without difficulty. Will continue to follow.   7. Disposition: F/u in 6 months with me or Dr. Saunders Revel.    Murray Hodgkins, NP 10/06/2020, 4:25 PM

## 2020-10-10 ENCOUNTER — Telehealth: Payer: Self-pay | Admitting: Nurse Practitioner

## 2020-10-10 NOTE — Telephone Encounter (Signed)
Spoke with patient and reviewed PFT scheduled for Tuesday 10/17/20 with arrival time of 1:15 PM at the [facility]. Also advised she would need COVID swab on 10/16/20 between 08:00 AM and 1:00 PM at the [facility]. She has paper with PFT testing information and instructions. She confirmed dates and times with no further questions at this time. Instructed her to please call back if she should have any questions before upcoming test. She was appreciative for the call back.

## 2020-10-16 ENCOUNTER — Other Ambulatory Visit: Payer: Self-pay

## 2020-10-16 ENCOUNTER — Other Ambulatory Visit
Admission: RE | Admit: 2020-10-16 | Discharge: 2020-10-16 | Disposition: A | Discharge status: Home / Self Care | Payer: BLUE CROSS/BLUE SHIELD | Source: Ambulatory Visit | Attending: Nurse Practitioner | Admitting: Nurse Practitioner

## 2020-10-16 DIAGNOSIS — Z01812 Encounter for preprocedural laboratory examination: Secondary | ICD-10-CM | POA: Diagnosis present

## 2020-10-16 DIAGNOSIS — Z20822 Contact with and (suspected) exposure to covid-19: Secondary | ICD-10-CM | POA: Diagnosis not present

## 2020-10-16 LAB — SARS CORONAVIRUS 2 (TAT 6-24 HRS): SARS Coronavirus 2: NEGATIVE

## 2020-10-17 ENCOUNTER — Ambulatory Visit: Payer: BLUE CROSS/BLUE SHIELD | Attending: Nurse Practitioner

## 2020-10-17 ENCOUNTER — Other Ambulatory Visit: Payer: Self-pay

## 2020-10-17 DIAGNOSIS — Z87891 Personal history of nicotine dependence: Secondary | ICD-10-CM | POA: Insufficient documentation

## 2020-10-17 DIAGNOSIS — R0600 Dyspnea, unspecified: Secondary | ICD-10-CM | POA: Insufficient documentation

## 2020-10-17 MED ORDER — ALBUTEROL SULFATE (2.5 MG/3ML) 0.083% IN NEBU
2.5000 mg | INHALATION_SOLUTION | Freq: Once | RESPIRATORY_TRACT | Status: AC
  Administered 2020-10-17: 2.5 mg via RESPIRATORY_TRACT
  Filled 2020-10-17: qty 3

## 2020-10-18 ENCOUNTER — Telehealth: Payer: Self-pay | Admitting: Nurse Practitioner

## 2020-10-18 DIAGNOSIS — J449 Chronic obstructive pulmonary disease, unspecified: Secondary | ICD-10-CM

## 2020-10-18 DIAGNOSIS — R0602 Shortness of breath: Secondary | ICD-10-CM

## 2020-10-18 NOTE — Telephone Encounter (Signed)
I spoke with the patient regarding her pulmonary function test results. She is aware of Ignacia Bayley, NP's recommendations to have a consultation with Pulmonary. The patient voices understanding of her results and is agreeable with a referral to [facility].  I have advised her of their location and that someone from the Pulmonary office will reach out to her directly to schedule.   Referral placed.

## 2020-10-18 NOTE — Telephone Encounter (Signed)
Theora Gianotti, NP  10/18/2020 7:59 AM EST      PFT with moderate obstructive airway disease. This may be contributing to her shortness of breath in the mornings. Please refer to pulmonology.

## 2020-10-20 ENCOUNTER — Other Ambulatory Visit: Payer: Self-pay | Admitting: Nurse Practitioner

## 2020-10-20 DIAGNOSIS — Z1283 Encounter for screening for malignant neoplasm of skin: Secondary | ICD-10-CM

## 2020-10-20 LAB — PULMONARY FUNCTION TEST ARMC ONLY
DL/VA % pred: 71 %
DL/VA: 2.96 ml/min/mmHg/L
DLCO unc % pred: 67 %
DLCO unc: 14.6 ml/min/mmHg
FEF 25-75 Post: 0.61 L/sec
FEF 25-75 Pre: 0.97 L/sec
FEF2575-%Change-Post: -37 %
FEF2575-%Pred-Post: 24 %
FEF2575-%Pred-Pre: 39 %
FEV1-%Change-Post: -12 %
FEV1-%Pred-Post: 54 %
FEV1-%Pred-Pre: 62 %
FEV1-Post: 1.5 L
FEV1-Pre: 1.71 L
FEV1FVC-%Change-Post: -9 %
FEV1FVC-%Pred-Pre: 80 %
FEV6-%Change-Post: -5 %
FEV6-%Pred-Post: 74 %
FEV6-%Pred-Pre: 79 %
FEV6-Post: 2.57 L
FEV6-Pre: 2.73 L
FEV6FVC-%Change-Post: -2 %
FEV6FVC-%Pred-Post: 100 %
FEV6FVC-%Pred-Pre: 103 %
FVC-%Change-Post: -3 %
FVC-%Pred-Post: 74 %
FVC-%Pred-Pre: 76 %
FVC-Post: 2.64 L
Post FEV1/FVC ratio: 57 %
Post FEV6/FVC ratio: 97 %
Pre FEV1/FVC ratio: 62 %
Pre FEV6/FVC Ratio: 100 %
RV % pred: 111 %
RV: 2.34 L
TLC % pred: 92 %
TLC: 4.96 L

## 2020-11-07 ENCOUNTER — Other Ambulatory Visit: Payer: Self-pay | Admitting: Nurse Practitioner

## 2020-11-07 NOTE — Telephone Encounter (Signed)
Requested medications are due for refill today.  yes  Requested medications are on the active medications list.  No and yes   Notes to clinic.  One mediction is not delegated, the other appears to be discontinued.

## 2020-11-07 NOTE — Telephone Encounter (Signed)
Requested Prescriptions  Pending Prescriptions Disp Refills  . amLODipine (NORVASC) 2.5 MG tablet [Pharmacy Med Name: AMLODIPINE BESYLATE 2.5 MG TAB] 90 tablet 3    Sig: TAKE 1 TABLET BY MOUTH EVERY DAY     Cardiovascular:  Calcium Channel Blockers Passed - 11/07/2020  6:57 PM      Passed - Last BP in normal range    BP Readings from Last 1 Encounters:  10/06/20 110/60         Passed - Valid encounter within last 6 months    Recent Outpatient Visits          1 month ago Coronary artery disease of native artery of native heart with stable angina pectoris ([place])   [place], [place] T, NP   4 months ago Vaginal atrophy   [place], [place] T, NP   4 months ago Coronary artery disease of native artery of native heart with stable angina pectoris ([place])   [place], Barbaraann Faster, NP   5 months ago Essential hypertension   [place], Henrine Screws T, NP   7 months ago Need for Tdap vaccination   [place], Barbaraann Faster, NP      Future Appointments            In 4 months Cannady, Barbaraann Faster, NP [employer], [place]   In 5 months [place], [place], [place]           . BRILINTA 60 MG TABS tablet [Pharmacy Med Name: BRILINTA 60 MG TABLET] 180 tablet 3    Sig: TAKE 1 TABLET (60 MG TOTAL) BY MOUTH 2 (TWO) TIMES DAILY.     Not Delegated - Hematology: Antiplatelets - ticagrelor Failed - 11/07/2020  6:57 PM      Failed - This refill cannot be delegated      Failed - Cr in normal range and within 180 days    Creatinine, Ser  Date Value Ref Range Status  09/22/2020 1.16 (H) 0.57 - 1.00 mg/dL Final         Failed - HCT in normal range and within 180 days    HCT  Date Value Ref Range Status  06/01/2020 35.4 (L) 36.0 - 46.0 % Final   Hematocrit  Date Value Ref Range Status  11/05/2019 36.2 34.0 - 46.6 % Final         Passed - HGB in normal range and within 180 days     Hemoglobin  Date Value Ref Range Status  06/01/2020 12.3 12.0 - 15.0 g/dL Final  11/05/2019 12.0 11.1 - 15.9 g/dL Final         Passed - PLT in normal range and within 180 days    Platelets  Date Value Ref Range Status  06/01/2020 353 150 - 400 K/uL Final  11/05/2019 359 150 - 450 x10E3/uL Final         Passed - Valid encounter within last 6 months    Recent Outpatient Visits          1 month ago Coronary artery disease of native artery of native heart with stable angina pectoris ([place])   [place], [place] T, NP   4 months ago Vaginal atrophy   [place], [place] T, NP   4 months ago Coronary artery disease of native artery of native heart with stable angina pectoris ([place])   [place], Barbaraann Faster, NP  5 months ago Essential hypertension   [place], Barbaraann Faster, NP   7 months ago Need for Tdap vaccination   [place], Barbaraann Faster, NP      Future Appointments            In 4 months Cannady, Barbaraann Faster, NP [employer], [place]   In 5 months [place], [place], MD [employer]           . metoprolol tartrate (LOPRESSOR) 25 MG tablet [Pharmacy Med Name: METOPROLOL TARTRATE 25 MG TAB] 90 tablet 1    Sig: TAKE 1 TABLET BY MOUTH EVERY DAY     Cardiovascular:  Beta Blockers Passed - 11/07/2020  6:57 PM      Passed - Last BP in normal range    BP Readings from Last 1 Encounters:  10/06/20 110/60         Passed - Last Heart Rate in normal range    Pulse Readings from Last 1 Encounters:  10/06/20 68         Passed - Valid encounter within last 6 months    Recent Outpatient Visits          1 month ago Coronary artery disease of native artery of native heart with stable angina pectoris ([place])   [place], [place] T, NP   4 months ago Vaginal atrophy   [place], [place] T, NP   4 months ago Coronary artery  disease of native artery of native heart with stable angina pectoris ([place])   [place], Barbaraann Faster, NP   5 months ago Essential hypertension   [place], Henrine Screws T, NP   7 months ago Need for Tdap vaccination   [place], Barbaraann Faster, NP      Future Appointments            In 4 months Cannady, Barbaraann Faster, NP [employer] [facility], [place]   In 5 months [place], [place], MD [place]           . pantoprazole (PROTONIX) 40 MG tablet [Pharmacy Med Name: PANTOPRAZOLE SOD DR 40 MG TAB] 90 tablet 1    Sig: TAKE 1 TABLET BY MOUTH EVERY DAY     Gastroenterology: Proton Pump Inhibitors Passed - 11/07/2020  6:57 PM      Passed - Valid encounter within last 12 months    Recent Outpatient Visits          1 month ago Coronary artery disease of native artery of native heart with stable angina pectoris ([place])   [place], [place] T, NP   4 months ago Vaginal atrophy   [place], [place] T, NP   4 months ago Coronary artery disease of native artery of native heart with stable angina pectoris ([place])   [place] Cannady, Barbaraann Faster, NP   5 months ago Essential hypertension   [place], Henrine Screws T, NP   7 months ago Need for Tdap vaccination   [place], Barbaraann Faster, NP      Future Appointments            In 4 months Cannady, Barbaraann Faster, NP [employer] [facility], [place]   In 5 months [place], [place], [place]           . levothyroxine (SYNTHROID) 75 MCG tablet [Pharmacy Med Name: LEVOTHYROXINE 75 MCG  TABLET] 90 tablet 1    Sig: TAKE 1 TABLET (75 MCG TOTAL) BY MOUTH DAILY BEFORE BREAKFAST.     Endocrinology:  Hypothyroid Agents Failed - 11/07/2020  6:57 PM      Failed - TSH needs to be rechecked within 3 months after an abnormal result. Refill until TSH is due.      Passed - TSH in normal range and within 360  days    TSH  Date Value Ref Range Status  09/22/2020 3.330 0.450 - 4.500 uIU/mL Final         Passed - Valid encounter within last 12 months    Recent Outpatient Visits          1 month ago Coronary artery disease of native artery of native heart with stable angina pectoris ([place])   [place], [place] T, NP   4 months ago Vaginal atrophy   [place], [place] T, NP   4 months ago Coronary artery disease of native artery of native heart with stable angina pectoris ([place])   [place], Barbaraann Faster, NP   5 months ago Essential hypertension   [place], Henrine Screws T, NP   7 months ago Need for Tdap vaccination   [place], Barbaraann Faster, NP      Future Appointments            In 4 months Cannady, Barbaraann Faster, NP [employer] [facility], [place]   In 5 months [place], [place], [place]

## 2020-11-08 NOTE — Telephone Encounter (Signed)
Routing to provider  

## 2020-11-15 ENCOUNTER — Ambulatory Visit: Payer: No Typology Code available for payment source | Admitting: Surgery

## 2020-11-15 ENCOUNTER — Encounter: Payer: Self-pay | Admitting: Surgery

## 2020-11-15 ENCOUNTER — Other Ambulatory Visit: Payer: Self-pay

## 2020-11-15 VITALS — BP 129/82 | HR 80 | Temp 98.2°F | Ht 66.0 in | Wt 148.6 lb

## 2020-11-15 DIAGNOSIS — D171 Benign lipomatous neoplasm of skin and subcutaneous tissue of trunk: Secondary | ICD-10-CM

## 2020-11-15 DIAGNOSIS — D174 Benign lipomatous neoplasm of intrathoracic organs: Secondary | ICD-10-CM | POA: Diagnosis not present

## 2020-11-15 NOTE — Patient Instructions (Addendum)
A referral to a Thoracic surgeon has been placed. They will call you for an appointment. If you have not heard from them in a week, let us know. If you have any concerns or questions, please feel free to call our office.     Lipoma  A lipoma is a noncancerous (benign) tumor that is made up of fat cells. This is a very common type of soft-tissue growth. Lipomas are usually found under the skin (subcutaneous). They may occur in any tissue of the body that contains fat. Common areas for lipomas to appear include the back, arms, shoulders, buttocks, and thighs. Lipomas grow slowly, and they are usually painless. Most lipomas do not cause problems and do not require treatment. What are the causes? The cause of this condition is not known. What increases the risk? You are more likely to develop this condition if:  You are 77-59 years old.  You have a family history of lipomas. What are the signs or symptoms? A lipoma usually appears as a small, round bump under the skin. In most cases, the lump will:  Feel soft or rubbery.  Not cause pain or other symptoms. However, if a lipoma is located in an area where it pushes on nerves, it can become painful or cause other symptoms. How is this diagnosed? A lipoma can usually be diagnosed with a physical exam. You may also have tests to confirm the diagnosis and to rule out other conditions. Tests may include:  Imaging tests, such as a CT scan or an MRI.  Removal of a tissue sample to be looked at under a microscope (biopsy). How is this treated? Treatment for this condition depends on the size of the lipoma and whether it is causing any symptoms.  For small lipomas that are not causing problems, no treatment is needed.  If a lipoma is bigger or it causes problems, surgery may be done to remove the lipoma. Lipomas can also be removed to improve appearance. Most often, the procedure is done after applying a medicine that numbs the area (local  anesthetic).  Liposuction may be done to reduce the size of the lipoma before it is removed through surgery, or it may be done to remove the lipoma. Lipomas are removed with this method in order to limit incision size and scarring. A liposuction tube is inserted through a small incision into the lipoma, and the contents of the lipoma are removed through the tube with suction. Follow these instructions at home:  Watch your lipoma for any changes.  Keep all follow-up visits as told by your health care provider. This is important. Contact a health care provider if:  Your lipoma becomes larger or hard.  Your lipoma becomes painful, red, or increasingly swollen. These could be signs of infection or a more serious condition. Get help right away if:  You develop tingling or numbness in an area near the lipoma. This could indicate that the lipoma is causing nerve damage. Summary  A lipoma is a noncancerous tumor that is made up of fat cells.  Most lipomas do not cause problems and do not require treatment.  If a lipoma is bigger or it causes problems, surgery may be done to remove the lipoma.  Contact a health care provider if your lipoma becomes larger or hard, or if it becomes painful, red, or increasingly swollen. Pain, redness, and swelling could be signs of infection or a more serious condition. This information is not intended to replace advice given  to you by your health care provider. Make sure you discuss any questions you have with your health care provider. Document Revised: 04/19/2019 Document Reviewed: 04/19/2019 Elsevier Patient Education  [place].

## 2020-11-15 NOTE — Progress Notes (Signed)
11/15/2020  Reason for Visit:  Back lipoma  Referring Provider:  Marnee Guarneri, NP  History of Present Illness: April Raymond is a 60 y.o. female presenting for evaluation of a back lipoma.  The patient is unsure how long exactly it's been there for, but she thinks may be a couple of years.  Back in March 2021, she had a thoracic MRI, which identified a 3.6 x 2.5 x 1.8 cm lipoma in the posterior right chest wall, extending between two ribs and protruding into the right chest cavity.  It appears well circumscribed and that it does not involve the lung itself.  This was just being watched for the time being, but more recently she's just getting some more symptoms from it, mostly with discomfort when lying back and with activity.  However, she's unsure if the discomfort is because of her back itself or because of the lipoma.  Denies any shooting pain or numbness, redness of the overlying skin, or drainage.  She does feel like it's hard and near the shoulder blade.  Of note, she also has a history of prior MI with stent placement in 2017, and is currently on Brilinta and Aspirin.  She also just recently had PFTs done which showed moderate obstructive airway disease, and she's being referred to Pulmonology.  Past Medical History: Past Medical History:  Diagnosis Date  . CAD (coronary artery disease)    a. 2017 s/p prior MI & PCI RCA ([place]); b. 05/2020 Abnl ETT: 1.5-15mm horizontal ST dep in inflat leads @ peak stress w/ freq PVCs/couplets/bigeminy. HTN response; b. 05/2020 NSTEMI/Cath: LM nl, LAD 28m, LCX 20, RCA 35ost, patent prev placed prox-dist stent. EF 25-35% w/ apical ballooning.  Marland Kitchen Hashimoto's disease   . Hidradenitis suppurativa   . Hydradenitis   . Hyperparathyroidism ([place])   . Hypertension   . Myocardial infarction ([place]) 2017  . Pericardial effusion    a. 05/2020 Echo: sm to mod circumferential peric eff w/o tamponade; b. 07/2020 Mod peric effusion; c. 09/2020 Echo: EF 60-65%, no rwma,  small tomod effusion w/o compromise.  . Takotsubo cardiomyopathy    a. 05/2020 Echo: EF 35-40%, glob HK, though basal wall motion best preserved consistent w/ stress induced CM. Gr1 DD. Nl RV size/fxn. Sm to mod circumferential pericardial effusion w/o tamponade.     Past Surgical History: Past Surgical History:  Procedure Laterality Date  . BREAST BIOPSY Left 1993   neg  . CARDIAC CATHETERIZATION    . CYST EXCISION  x2  . LEFT HEART CATH AND CORONARY ANGIOGRAPHY N/A 05/31/2020   Procedure: LEFT HEART CATH AND CORONARY ANGIOGRAPHY;  Surgeon: Nelva Bush, MD;  Location: [facility];  Service: Cardiovascular;  Laterality: N/A;  . PARATHYROIDECTOMY    . TONSILLECTOMY      Home Medications: Prior to Admission medications   Medication Sig Start Date End Date Taking? Authorizing Provider  atorvastatin (LIPITOR) 80 MG tablet Take 1 tablet (80 mg total) by mouth daily. 06/20/20  Yes Cannady, Jolene T, NP  BRILINTA 60 MG TABS tablet TAKE 1 TABLET (60 MG TOTAL) BY MOUTH 2 (TWO) TIMES DAILY. 11/08/20  Yes Cannady, Henrine Screws T, NP  conjugated estrogens (PREMARIN) vaginal cream Place 0.5 grams twice weekly intravaginally. 04/24/20  Yes Cannady, Jolene T, NP  furosemide (LASIX) 20 MG tablet Take 1 tablet (20 mg total) by mouth as directed. Take once daily and the PM pill to be taken as needed for shortness of breath or swelling 06/12/20  Yes Gollan,  Kathlene November, MD  levothyroxine (SYNTHROID) 75 MCG tablet TAKE 1 TABLET (75 MCG TOTAL) BY MOUTH DAILY BEFORE BREAKFAST. 11/07/20  Yes Cannady, Jolene T, NP  losartan (COZAAR) 25 MG tablet Take 0.5 tablets (12.5 mg total) by mouth daily. 06/20/20  Yes Cannady, Jolene T, NP  metoprolol succinate (TOPROL-XL) 25 MG 24 hr tablet TAKE 1/2 TABLET BY MOUTH EVERY DAY 09/17/20  Yes Cannady, Jolene T, NP  nitroGLYCERIN (NITROSTAT) 0.4 MG SL tablet Place 1 tablet (0.4 mg total) under the tongue every 5 (five) minutes as needed for chest pain. 11/05/19 07/12/29 Yes End,  Harrell Gave, MD  pantoprazole (PROTONIX) 40 MG tablet TAKE 1 TABLET BY MOUTH EVERY DAY 11/07/20  Yes Venita Lick, NP    Allergies: Allergies  Allergen Reactions  . Humira [Adalimumab] Rash    Social History:  reports that she quit smoking about 5 years ago. Her smoking use included cigarettes. She has a 40.00 pack-year smoking history. She has never used smokeless tobacco. She reports current alcohol use. She reports that she does not use drugs.   Family History: Family History  Problem Relation Age of Onset  . Heart attack Mother 31  . COPD Mother   . Stroke Mother   . Diverticulitis Mother   . Cancer Mother        breast  . Hypertension Mother   . Breast cancer Mother 68  . Hypertension Father   . Hypertension Brother   . Stroke Maternal Grandfather   . Alzheimer's disease Paternal Grandmother   . Heart attack Paternal Grandfather   . Breast cancer Cousin        mat cousin    Review of Systems: Review of Systems  Constitutional: Negative for chills and fever.  Respiratory: Negative for shortness of breath.   Cardiovascular: Negative for chest pain.  Gastrointestinal: Negative for abdominal pain, nausea and vomiting.  Musculoskeletal: Positive for back pain.  Skin: Negative for rash.    Physical Exam BP 129/82   Pulse 80   Temp 98.2 F (36.8 C) (Oral)   Ht 5\' 6"  (1.676 m)   Wt 148 lb 9.6 oz (67.4 kg)   LMP 11/16/2007 (Approximate)   SpO2 99%   BMI 23.98 kg/m  CONSTITUTIONAL: No acute distress HEENT:  Normocephalic, atraumatic, extraocular motion intact. RESPIRATORY:  Normal respiratory effort without pathologic use of accessory muscles. CARDIOVASCULAR:  Regular rhythm and rate. MUSCULOSKELETAL:  Normal muscle strength and tone in all four extremities.  No peripheral edema or cyanosis. SKIN:  The patient has a 3 cm palpable mass in the right posterior chest wall, distal to the tip of the scapula, in the intercostal space between ribs 8 and 9.  The mass  feels hard and somewhat raised.  It does not change with her shoulder positioning.  No overlying erythema or drainage noted. NEUROLOGIC:  Motor and sensation is grossly normal.  Cranial nerves are grossly intact. PSYCH:  Alert and oriented to person, place and time. Affect is normal.  Laboratory Analysis: No results found for this or any previous visit (from the past 24 hour(s)).  Imaging: MRI thoracic spine 12/02/19: FINDINGS: There is a 3.6 x 2.5 x 1.8 cm lobulated lipoma in the posterior right chest wall protruding between 2 ribs into the right hemithorax. There is no significant non-fatty component to this benign-appearing lipoma. Is deep to the chest wall musculature. The adjacent ribs appear normal. The adjacent vessels are normal. There is slight enhancement of the margins of the mass after contrast  administration but there is no internal enhancement of significance.  IMPRESSION: Benign-appearing lipoma in the posterior right chest wall. I suspect that surgical removal would be complicated. If the lesion progresses, this could be re-evaluated but the current exam demonstrates no worrisome characteristics.  Assessment and Plan: This is a 60 y.o. female with a back lipoma protruding through the right posterior chest wall.  I have personally viewed the imaging studies and discussed them with the patient.  Her mass is extending deep to the intercostal musculature between 8th and 9th rib, and protruding into the posterior chest cavity. The right lung does not appear to be involved.  Based on the location and depth, I think it would be better to refer the patient to Thoracic surgery for evaluation and consideration for excision.  Her symptoms are currently not too significant, with only some discomfort.  However, discussed with her that this mass could get bigger with time, which could then push on the ribs or the neurovascular bundle.  At this point, Dr. Genevive Bi who is our Thoracic Surgeon is not  available and it's uncertain when he'd be available to see her.  Thus, we'll send the referral to [facility] CT team.  Patient is in agreement and all of her questions have been answered.  Face-to-face time spent with the patient and care providers was 40 minutes, with more than 50% of the time spent counseling, educating, and coordinating care of the patient.     Melvyn Neth, [facility]

## 2020-11-20 ENCOUNTER — Ambulatory Visit: Payer: No Typology Code available for payment source | Admitting: Nurse Practitioner

## 2020-11-23 ENCOUNTER — Encounter: Payer: No Typology Code available for payment source | Admitting: Thoracic Surgery (Cardiothoracic Vascular Surgery)

## 2020-11-24 ENCOUNTER — Encounter: Payer: Self-pay | Admitting: Thoracic Surgery (Cardiothoracic Vascular Surgery)

## 2020-11-24 ENCOUNTER — Institutional Professional Consult (permissible substitution): Payer: No Typology Code available for payment source | Admitting: Thoracic Surgery (Cardiothoracic Vascular Surgery)

## 2020-11-24 ENCOUNTER — Other Ambulatory Visit: Payer: Self-pay

## 2020-11-24 VITALS — BP 114/74 | HR 73 | Resp 20 | Ht 66.0 in

## 2020-11-24 DIAGNOSIS — D171 Benign lipomatous neoplasm of skin and subcutaneous tissue of trunk: Secondary | ICD-10-CM | POA: Diagnosis not present

## 2020-11-24 NOTE — Progress Notes (Signed)
[address]       [place] [address]             [phone]                    [name] [place] Medical Record #[id number] Date of Birth: [birth year]  Referring: Olean Ree, MD Primary Care: Venita Lick, NP Primary Cardiologist: Nelva Bush, MD  Chief Complaint:    Chief Complaint  Patient presents with  . Lipoma    PFT 2/1, ECHO 1/20, CTA chest 9/14  . Consult    Initial surgical consult    History of Present Illness:    April Raymond 60 y.o. female referred for surgical evaluation of a right back lipoma.  The patient states that she noticed the 1 day while looking in the mirror.  Her concern is that this may be enlarging, and that surgery may be more difficult if she allows it to grow much more.  She denies any pain or paresthesias along her back.    Past Medical History:  Diagnosis Date  . CAD (coronary artery disease)    a. 2017 s/p prior MI & PCI RCA ([place]); b. 05/2020 Abnl ETT: 1.5-74mm horizontal ST dep in inflat leads @ peak stress w/ freq PVCs/couplets/bigeminy. HTN response; b. 05/2020 NSTEMI/Cath: LM nl, LAD 42m, LCX 20, RCA 35ost, patent prev placed prox-dist stent. EF 25-35% w/ apical ballooning.  Marland Kitchen Hashimoto's disease   . Hidradenitis suppurativa   . Hydradenitis   . Hyperparathyroidism ([place])   . Hypertension   . Myocardial infarction ([place]) 2017  . Pericardial effusion    a. 05/2020 Echo: sm to mod circumferential peric eff w/o tamponade; b. 07/2020 Mod peric effusion; c. 09/2020 Echo: EF 60-65%, no rwma, small tomod effusion w/o compromise.  . Takotsubo cardiomyopathy    a. 05/2020 Echo: EF 35-40%, glob HK, though basal wall motion best preserved consistent w/ stress induced CM. Gr1 DD. Nl RV size/fxn. Sm to mod circumferential pericardial effusion w/o tamponade.    Past Surgical History:  Procedure Laterality Date  . BREAST BIOPSY Left 1993   neg  . CARDIAC CATHETERIZATION    . CYST EXCISION  x2  . LEFT HEART  CATH AND CORONARY ANGIOGRAPHY N/A 05/31/2020   Procedure: LEFT HEART CATH AND CORONARY ANGIOGRAPHY;  Surgeon: Nelva Bush, MD;  Location: [facility];  Service: Cardiovascular;  Laterality: N/A;  . PARATHYROIDECTOMY    . TONSILLECTOMY      Family History  Problem Relation Age of Onset  . Heart attack Mother 74  . COPD Mother   . Stroke Mother   . Diverticulitis Mother   . Cancer Mother        breast  . Hypertension Mother   . Breast cancer Mother 89  . Hypertension Father   . Hypertension Brother   . Stroke Maternal Grandfather   . Alzheimer's disease Paternal Grandmother   . Heart attack Paternal Grandfather   . Breast cancer Cousin        mat cousin     Social History   Tobacco Use  Smoking Status Former Smoker  . Packs/day: 1.00  . Years: 40.00  . Pack years: 40.00  . Types: Cigarettes  . Quit date: 09/18/2015  . Years since quitting: 5.1  Smokeless Tobacco Never Used    Social History   Substance and Sexual Activity  Alcohol Use Yes   Comment: on occasion  Allergies  Allergen Reactions  . Humira [Adalimumab] Rash    Current Outpatient Medications  Medication Sig Dispense Refill  . atorvastatin (LIPITOR) 80 MG tablet Take 1 tablet (80 mg total) by mouth daily. 90 tablet 4  . BRILINTA 60 MG TABS tablet TAKE 1 TABLET (60 MG TOTAL) BY MOUTH 2 (TWO) TIMES DAILY. 180 tablet 4  . conjugated estrogens (PREMARIN) vaginal cream Place 0.5 grams twice weekly intravaginally. 42.5 g 12  . furosemide (LASIX) 20 MG tablet Take 1 tablet (20 mg total) by mouth as directed. Take once daily and the PM pill to be taken as needed for shortness of breath or swelling 180 tablet 3  . levothyroxine (SYNTHROID) 75 MCG tablet TAKE 1 TABLET (75 MCG TOTAL) BY MOUTH DAILY BEFORE BREAKFAST. 90 tablet 1  . losartan (COZAAR) 25 MG tablet Take 0.5 tablets (12.5 mg total) by mouth daily. 45 tablet 4  . metoprolol succinate (TOPROL-XL) 25 MG 24 hr tablet TAKE 1/2 TABLET BY  MOUTH EVERY DAY 45 tablet 4  . nitroGLYCERIN (NITROSTAT) 0.4 MG SL tablet Place 1 tablet (0.4 mg total) under the tongue every 5 (five) minutes as needed for chest pain. 25 tablet 3  . pantoprazole (PROTONIX) 40 MG tablet TAKE 1 TABLET BY MOUTH EVERY DAY 90 tablet 1   No current facility-administered medications for this visit.    Review of Systems  Constitutional: Negative for weight loss.  Respiratory: Negative for shortness of breath.   Cardiovascular: Negative for chest pain.  Musculoskeletal: Negative for back pain.    PHYSICAL EXAMINATION: BP 114/74 (BP Location: Right Arm, Patient Position: Sitting)   Pulse 73   Resp 20   Ht 5\' 6"  (1.676 m)   LMP 11/16/2007 (Approximate)   SpO2 97% Comment: RA  BMI 23.98 kg/m   Physical Exam Constitutional:      General: She is not in acute distress.    Appearance: Normal appearance. She is normal weight. She is not ill-appearing.  HENT:     Head: Normocephalic.  Eyes:     Extraocular Movements: Extraocular movements intact.  Cardiovascular:     Rate and Rhythm: Normal rate.  Pulmonary:     Effort: Pulmonary effort is normal. No respiratory distress.    Musculoskeletal:        General: Normal range of motion.     Cervical back: Normal range of motion.  Skin:    General: Skin is warm and dry.  Neurological:     General: No focal deficit present.     Mental Status: She is alert and oriented to person, place, and time.      Diagnostic Studies & Laboratory data:     Recent Radiology Findings:   No results found.     I have independently reviewed the above radiology studies  and reviewed the findings with the patient.   Recent Lab Findings: Lab Results  Component Value Date   WBC 7.1 06/01/2020   HGB 12.3 06/01/2020   HCT 35.4 (L) 06/01/2020   PLT 353 06/01/2020   GLUCOSE 90 09/22/2020   CHOL 146 09/22/2020   TRIG 135 09/22/2020   HDL 66 09/22/2020   LDLCALC 57 09/22/2020   ALT 20 05/30/2020   AST 33 05/30/2020    NA 141 09/22/2020   K 3.9 09/22/2020   CL 102 09/22/2020   CREATININE 1.16 (H) 09/22/2020   BUN 25 09/22/2020   CO2 24 09/22/2020   TSH 3.330 09/22/2020   INR 0.9 05/30/2020  Assessment / Plan:   60 year old female who presents with a lipoma along her right back.  It is difficult to palpate, but there is some prominence along her right scapular border.  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  There does not appear to be any pleural invasion.  We had a long discussion about the risks and benefits of surgery, and I explained to her that this would require surgical incision.  There is a chance that there will be some pleural injury thus she may require chest tube placement at the end of the case.  I do not think that there is any invasion of the ribs thus a chest wall resection likely will not be required, but if so this will be reconstructed with mesh.  I would like to obtain a repeat MRI to ensure that the lipoma has not changed significantly in the year.  I do not think that this can be approached robotically, but formal scan will help me delineate this.  She also has a history of coronary artery disease and Takotsubo cardiomyopathy for which she is being followed by Dr. Saunders Revel.  She is currently on Brilinta.  She would like to proceed with surgery which will be performed in the middle of May.  I will touch base with Dr. Saunders Revel, and see her back in early May to rediscuss surgical options.      [name] 11/24/2020 1:51 PM

## 2020-12-04 ENCOUNTER — Encounter: Payer: Self-pay | Admitting: Pulmonary Disease

## 2020-12-04 ENCOUNTER — Ambulatory Visit: Payer: No Typology Code available for payment source | Admitting: Pulmonary Disease

## 2020-12-04 ENCOUNTER — Other Ambulatory Visit
Admission: RE | Admit: 2020-12-04 | Discharge: 2020-12-04 | Disposition: A | Discharge status: Home / Self Care | Payer: BLUE CROSS/BLUE SHIELD | Source: Ambulatory Visit | Attending: Pulmonary Disease | Admitting: Pulmonary Disease

## 2020-12-04 ENCOUNTER — Other Ambulatory Visit: Payer: Self-pay

## 2020-12-04 VITALS — BP 122/70 | HR 68 | Temp 97.1°F | Ht 66.0 in | Wt 146.0 lb

## 2020-12-04 DIAGNOSIS — R0602 Shortness of breath: Secondary | ICD-10-CM | POA: Diagnosis not present

## 2020-12-04 DIAGNOSIS — J449 Chronic obstructive pulmonary disease, unspecified: Secondary | ICD-10-CM | POA: Diagnosis not present

## 2020-12-04 DIAGNOSIS — Z87891 Personal history of nicotine dependence: Secondary | ICD-10-CM

## 2020-12-04 MED ORDER — ALBUTEROL SULFATE 108 (90 BASE) MCG/ACT IN AEPB: 2.0000 | INHALATION_SPRAY | Freq: Four times a day (QID) | RESPIRATORY_TRACT | 3 refills | Status: DC | PRN

## 2020-12-04 MED ORDER — ANORO ELLIPTA 62.5-25 MCG/INH IN AEPB: 1.0000 | INHALATION_SPRAY | Freq: Every day | RESPIRATORY_TRACT | 0 refills | Status: AC

## 2020-12-04 NOTE — Progress Notes (Signed)
Subjective:    Patient ID: April Raymond, female    DOB: December 23, 1960, 60 y.o.   MRN: [id number] Chief Complaint  Patient presents with  . pulmonary consult    Per Murray Hodgkins, NP. C/o unable to take deep breath.    HPI Patient is a 60 year old former smoker (40-pack-year history) who presents for evaluation of dyspnea that occurs mostly in the mornings.  She is kindly referred by Cherre Blanc, NP at the patient states she had stress induced cardiomyopathy in September 2021 and has noted  dyspnea since then.  She states that humidity makes her symptoms worse.  She has never been diagnosed with pulmonary disease in the past.  She does not endorse any orthopnea, paroxysmal nocturnal dyspnea or lower extremity edema.  She has no gastroesophageal reflux symptoms.  No chest pain.  She has not had any fevers, chills or sweats.  Morning cough that is mostly productive of yellowish to grayish sputum.  No hemoptysis.  She has not noted anything that improves her symptoms of dyspnea.  She will note dyspnea on inclines as well as going up stairs.  She can do well on level ground.  She does not have any military history or prior occupational exposure.   DATA: 10/05/2018 two 2D echo: Small to moderate size pericardial effusion without hemodynamic compromise, LVEF 60 to 65%.  Indeterminate diastolics.  Normal RV function.  No valvular abnormality 10/17/2020 PFTs: FEV1 1.71 L or 62% predicted, FVC 2.73 L or 76% predicted, FEV1/FVC 62%, no bronchodilator response.  Diffusion capacity mildly reduced.  Consistent with moderate COPD.   Review of Systems A 10 point review of systems was performed and it is as noted above otherwise negative.  Past Medical History:  Diagnosis Date  . CAD (coronary artery disease)    a. 2017 s/p prior MI & PCI RCA ([place]); b. 05/2020 Abnl ETT: 1.5-82mm horizontal ST dep in inflat leads @ peak stress w/ freq PVCs/couplets/bigeminy. HTN response; b. 05/2020 NSTEMI/Cath: LM  nl, LAD 10m, LCX 20, RCA 35ost, patent prev placed prox-dist stent. EF 25-35% w/ apical ballooning.  Marland Kitchen Hashimoto's disease   . Hidradenitis suppurativa   . Hydradenitis   . Hyperparathyroidism ([place])   . Hypertension   . Myocardial infarction ([place]) 2017  . Pericardial effusion    a. 05/2020 Echo: sm to mod circumferential peric eff w/o tamponade; b. 07/2020 Mod peric effusion; c. 09/2020 Echo: EF 60-65%, no rwma, small tomod effusion w/o compromise.  . Takotsubo cardiomyopathy    a. 05/2020 Echo: EF 35-40%, glob HK, though basal wall motion best preserved consistent w/ stress induced CM. Gr1 DD. Nl RV size/fxn. Sm to mod circumferential pericardial effusion w/o tamponade.   Past Surgical History:  Procedure Laterality Date  . BREAST BIOPSY Left 1993   neg  . CARDIAC CATHETERIZATION    . CYST EXCISION  x2  . LEFT HEART CATH AND CORONARY ANGIOGRAPHY N/A 05/31/2020   Procedure: LEFT HEART CATH AND CORONARY ANGIOGRAPHY;  Surgeon: Nelva Bush, MD;  Location: [facility];  Service: Cardiovascular;  Laterality: N/A;  . PARATHYROIDECTOMY    . TONSILLECTOMY     Family History  Problem Relation Age of Onset  . Heart attack Mother 37  . COPD Mother   . Stroke Mother   . Diverticulitis Mother   . Cancer Mother        breast  . Hypertension Mother   . Breast cancer Mother 59  . Hypertension Father   . Hypertension  Brother   . Stroke Maternal Grandfather   . Alzheimer's disease Paternal Grandmother   . Heart attack Paternal Grandfather   . Breast cancer Cousin        mat cousin   Social History   Tobacco Use  . Smoking status: Former Smoker    Packs/day: 1.00    Years: 40.00    Pack years: 40.00    Types: Cigarettes    Quit date: 09/18/2015    Years since quitting: 5.2  . Smokeless tobacco: Never Used  Substance Use Topics  . Alcohol use: Yes    Comment: on occasion   Allergies  Allergen Reactions  . Humira [Adalimumab] Rash   Current Meds  Medication Sig  .  atorvastatin (LIPITOR) 80 MG tablet Take 1 tablet (80 mg total) by mouth daily.  Marland Kitchen BRILINTA 60 MG TABS tablet TAKE 1 TABLET (60 MG TOTAL) BY MOUTH 2 (TWO) TIMES DAILY.  Marland Kitchen conjugated estrogens (PREMARIN) vaginal cream Place 0.5 grams twice weekly intravaginally.  . furosemide (LASIX) 20 MG tablet Take 1 tablet (20 mg total) by mouth as directed. Take once daily and the PM pill to be taken as needed for shortness of breath or swelling  . levothyroxine (SYNTHROID) 75 MCG tablet TAKE 1 TABLET (75 MCG TOTAL) BY MOUTH DAILY BEFORE BREAKFAST.  Marland Kitchen losartan (COZAAR) 25 MG tablet Take 0.5 tablets (12.5 mg total) by mouth daily.  . metoprolol succinate (TOPROL-XL) 25 MG 24 hr tablet TAKE 1/2 TABLET BY MOUTH EVERY DAY  . nitroGLYCERIN (NITROSTAT) 0.4 MG SL tablet Place 1 tablet (0.4 mg total) under the tongue every 5 (five) minutes as needed for chest pain.  . pantoprazole (PROTONIX) 40 MG tablet TAKE 1 TABLET BY MOUTH EVERY DAY   Immunization History  Administered Date(s) Administered  . Influenza,inj,Quad PF,6+ Mos 05/19/2020  . Influenza-Unspecified 07/19/2019  . Moderna Sars-Covid-2 Vaccination 12/16/2019, 01/13/2020, 08/08/2020  . Tdap 03/21/2020       Objective:   Physical Exam BP 122/70 (BP Location: Left Arm, Cuff Size: Normal)   Pulse 68   Temp (!) 97.1 F (36.2 C) (Temporal)   Ht 5\' 6"  (1.676 m)   Wt 146 lb (66.2 kg)   LMP 11/16/2007 (Approximate)   SpO2 98%   BMI 23.57 kg/m  GENERAL: Well-developed, well-nourished woman in no acute distress.  Fully ambulatory.  No conversational dyspnea. HEAD: Normocephalic, atraumatic.  EYES: Pupils equal, round, reactive to light.  No scleral icterus.  MOUTH: Nose/mouth/throat not examined due to masking requirements for COVID 19. NECK: Supple. No thyromegaly. Trachea midline. No JVD.  No adenopathy. PULMONARY: Good air entry bilaterally.  Coarse with no other adventitious sounds. CARDIOVASCULAR: S1 and S2. Regular rate and rhythm.  No rubs,  murmurs or gallops heard. ABDOMEN: Benign. MUSCULOSKELETAL: No joint deformity, no clubbing, no edema.  NEUROLOGIC: No focal deficit, no gait disturbance, speech is fluent. SKIN: Intact,warm,dry.  No rashes. PSYCH: Mood and behavior normal.     Assessment & Plan:     ICD-10-CM   1. COPD, moderate ([place])  J44.9 Alpha-1 antitrypsin phenotype    CANCELED: Alpha-1 antitrypsin phenotype   Appears to be poorly compensated Trial of Anoro 1 inhalation daily As needed albuterol  2. Shortness of breath  R06.02    Preserved cardiac function Likely due to poorly compensated COPD Plan as above  3. Former smoker  Z87.891    Patient remains abstinent of cigarettes   Orders Placed This Encounter  Procedures  . Alpha-1 antitrypsin phenotype  Standing Status:   Future    Number of Occurrences:   1    Standing Expiration Date:   12/04/2021   Meds ordered this encounter  Medications  . umeclidinium-vilanterol (ANORO ELLIPTA) 62.5-25 MCG/INH AEPB    Sig: Inhale 1 puff into the lungs daily for 1 day.    Dispense:  7 each    Refill:  0    Order Specific Question:   Lot Number?    Answer:   [id number]    Order Specific Question:   Expiration Date?    Answer:   10/17/2021    Order Specific Question:   Manufacturer?    Answer:   [employer] [[id number]]    Order Specific Question:   Quantity    Answer:   2  . Albuterol Sulfate (PROAIR RESPICLICK) 998 (90 Base) MCG/ACT AEPB    Sig: Inhale 2 puffs into the lungs every 6 (six) hours as needed (sob/wheezing).    Dispense:  1 each    Refill:  3   Poorly compensated moderate COPD.  Will obtain alpha-1 antitrypsin.  Trial of Anoro.  As needed albuterol.  As an additional thought, the patient is also on Brillinta which can cause shortness of breath particularly in patients with COPD.  If her shortness of breath is not improved with the above interventions will need to consider switching the patient to a different antiplatelet agent.  We will see her in  follow-up in 2 months time she is to contact us prior to that time should any new difficulties arise.  Renold Don, MD [facility]   *This note was dictated using voice recognition software/Dragon.  Despite best efforts to proofread, errors can occur which can change the meaning.  Any change was purely unintentional.

## 2020-12-04 NOTE — Patient Instructions (Signed)
We are going to give you a trial of Anoro Ellipta, this is an inhaler to use only once a day make sure you rinse your mouth well after use it.  Let us know how you are doing with this inhaler.  We can send the prescription in if you do well with it.  We have also sent a prescription in for albuterol which is an emergency inhaler which you can carry with you and your pocket book and use if you get unduly short of breath.  We will see in follow-up in 2 months time call sooner should any new problems arise.

## 2020-12-05 LAB — ALPHA-1 ANTITRYPSIN PHENOTYPE: A-1 Antitrypsin, Ser: 80 mg/dL — ABNORMAL LOW (ref 101–187)

## 2020-12-06 ENCOUNTER — Other Ambulatory Visit: Payer: Self-pay | Admitting: Nurse Practitioner

## 2020-12-12 ENCOUNTER — Telehealth: Payer: Self-pay | Admitting: Pulmonary Disease

## 2020-12-12 MED ORDER — ANORO ELLIPTA 62.5-25 MCG/INH IN AEPB: 1.0000 | INHALATION_SPRAY | Freq: Every day | RESPIRATORY_TRACT | 11 refills | Status: AC

## 2020-12-12 NOTE — Telephone Encounter (Signed)
Rx for Anoro has been sent to preferred pharmacy, as patient feels that this medication is effective.  Nothing further is needed at this time.

## 2020-12-20 ENCOUNTER — Other Ambulatory Visit: Payer: Self-pay | Admitting: *Deleted

## 2020-12-20 DIAGNOSIS — D171 Benign lipomatous neoplasm of skin and subcutaneous tissue of trunk: Secondary | ICD-10-CM

## 2021-01-12 ENCOUNTER — Encounter: Payer: Self-pay | Admitting: Pulmonary Disease

## 2021-01-12 ENCOUNTER — Encounter: Payer: Self-pay | Admitting: Nurse Practitioner

## 2021-01-12 DIAGNOSIS — J449 Chronic obstructive pulmonary disease, unspecified: Secondary | ICD-10-CM | POA: Insufficient documentation

## 2021-01-15 ENCOUNTER — Other Ambulatory Visit: Payer: Self-pay

## 2021-01-15 ENCOUNTER — Ambulatory Visit
Admission: RE | Admit: 2021-01-15 | Discharge: 2021-01-15 | Disposition: A | Discharge status: Home / Self Care | Payer: BLUE CROSS/BLUE SHIELD | Source: Ambulatory Visit | Attending: Thoracic Surgery (Cardiothoracic Vascular Surgery) | Admitting: Thoracic Surgery (Cardiothoracic Vascular Surgery)

## 2021-01-15 DIAGNOSIS — D171 Benign lipomatous neoplasm of skin and subcutaneous tissue of trunk: Secondary | ICD-10-CM | POA: Diagnosis present

## 2021-01-15 IMAGING — MR MR CHEST MEDIASTINUM WO/W CM
8 series · 16 of 16 positions shown · IV contrast (6ml Gadavist)
Comparison: MRI thoracic spine dated [DATE]. CTA chest dated
[DATE].

CLINICAL DATA: Follow-up right posterior chest wall lipoma

EXAM:
MR CHEST WITH AND WITHOUT CONTRAST
TECHNIQUE: Multiplanar, multisequence MR imaging of the right posterior chest
was performed before and after the administration of intravenous
contrast.
CONTRAST:  6mL GADAVIST GADOBUTROL 1 MMOL/ML IV SOLN

[Series 3: T1 · axial · 5.0mm · 0.66mm/px · z∈[-58,+146]mm · 2 of 35 slices shown (1 of 2)]
[im 1/35]
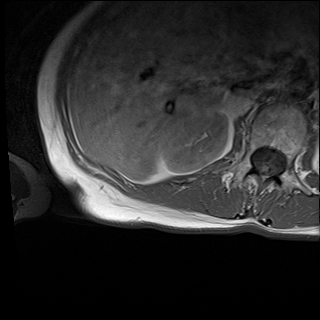
[im 35/35]
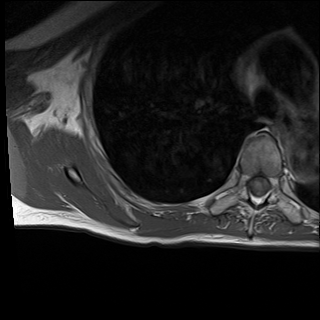

[Series 4: T2 fat-sat · axial · 5.0mm · 0.60mm/px · z∈[-68,+136]mm · 2 of 35 slices shown]
[im 1/35]
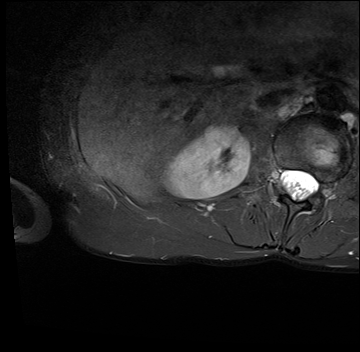
[im 35/35]
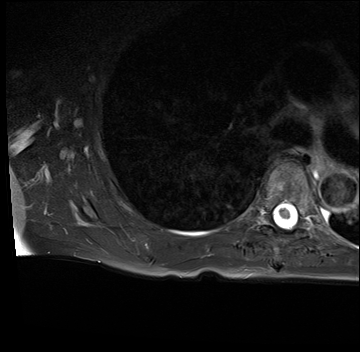

[Series 5: T1 fat-sat · axial · 5.0mm · 0.81mm/px · z∈[-58,+146]mm · 2 of 35 slices shown]
[im 1/35]
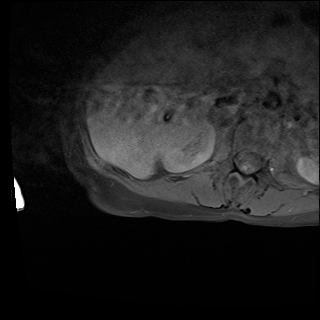
[im 35/35]
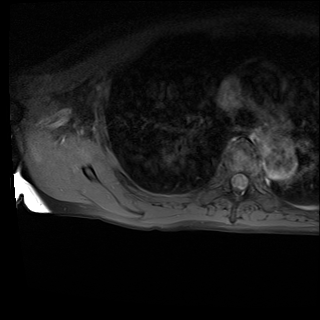

[Series 6: T1 · sagittal · 4.0mm · 0.66mm/px · 2 of 30 slices shown (2 of 2)]
[im 1/30]
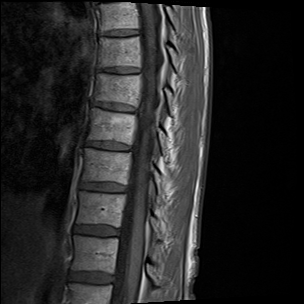
[im 30/30]
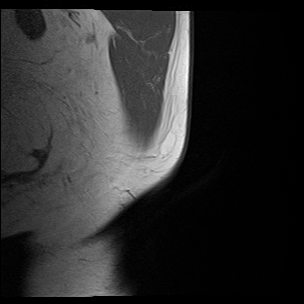

[Series 8: STIR · coronal · 4.0mm · 0.62mm/px · 2 of 25 slices shown]
[im 1/25]
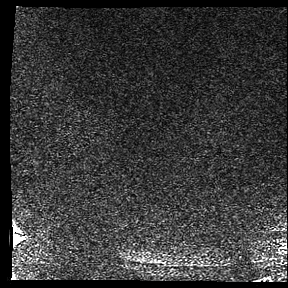
[im 25/25]
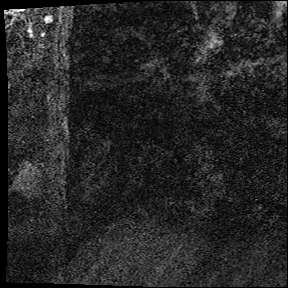

[Series 11: T1 fat-sat post-contrast · coronal · 4.0mm · 1.17mm/px · 2 of 25 slices shown (1 of 3)]
[im 1/25]
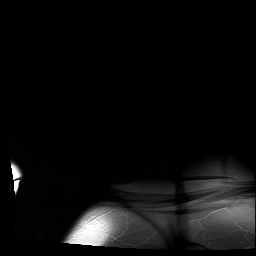
[im 25/25]
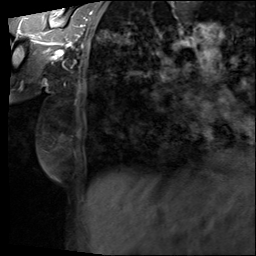

[Series 12: T1 fat-sat post-contrast · axial · 5.0mm · 0.66mm/px · z∈[-58,+146]mm · 2 of 35 slices shown (2 of 3)]
[im 1/35]
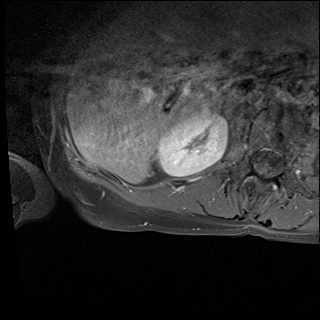
[im 35/35]
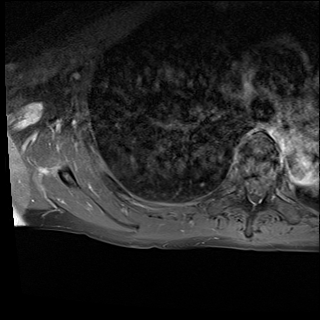

[Series 13: T1 fat-sat post-contrast · sagittal · 4.0mm · 0.72mm/px · 2 of 30 slices shown (3 of 3)]
[im 1/30]
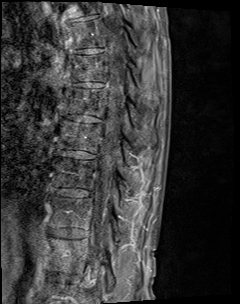
[im 30/30]
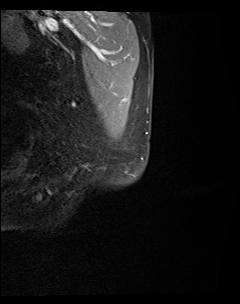

[16 of 16 positions shown; findings below may reference images not displayed]

FINDINGS: 2.0 x 2.2 x 3.1 cm lobulated fatty lesion in the right posterior
chest wall (series 3/image 14; series 6/image 18), previously 1.8 x
2.5 x 3.6 cm, unchanged.

When correlating with prior CT chest, this is in the right posterior
8th/9th rib interspace.

This lesion is deep to the posterior chest wall musculature (series
3/image 15) and protrudes anteriorly along the posterior pleural
surface (series 3/image 13).

No non-fatty soft tissue component within the lesion. No enhancement
within the lesion following contrast administration. No adjacent
osseous abnormality/destruction.
IMPRESSION: 3.1 cm lobulated fatty lesion at the right posterior 8th/9th rib
interspace, unchanged, compatible with a simple lipoma on imaging.

## 2021-01-15 MED ORDER — GADOBUTROL 1 MMOL/ML IV SOLN
6.0000 mL | Freq: Once | INTRAVENOUS | Status: AC | PRN
  Administered 2021-01-15: 6 mL via INTRAVENOUS

## 2021-01-16 ENCOUNTER — Ambulatory Visit: Payer: BLUE CROSS/BLUE SHIELD

## 2021-01-19 ENCOUNTER — Ambulatory Visit: Payer: No Typology Code available for payment source | Admitting: Thoracic Surgery (Cardiothoracic Vascular Surgery)

## 2021-02-02 ENCOUNTER — Ambulatory Visit: Payer: No Typology Code available for payment source | Admitting: Thoracic Surgery (Cardiothoracic Vascular Surgery)

## 2021-02-19 ENCOUNTER — Other Ambulatory Visit: Payer: Self-pay

## 2021-02-19 ENCOUNTER — Encounter: Payer: Self-pay | Admitting: Pulmonary Disease

## 2021-02-19 ENCOUNTER — Ambulatory Visit: Payer: No Typology Code available for payment source | Admitting: Pulmonary Disease

## 2021-02-19 VITALS — BP 126/74 | HR 79 | Temp 97.8°F | Ht 66.0 in | Wt 144.6 lb

## 2021-02-19 DIAGNOSIS — J449 Chronic obstructive pulmonary disease, unspecified: Secondary | ICD-10-CM

## 2021-02-19 DIAGNOSIS — Z148 Genetic carrier of other disease: Secondary | ICD-10-CM | POA: Diagnosis not present

## 2021-02-19 DIAGNOSIS — R0602 Shortness of breath: Secondary | ICD-10-CM | POA: Diagnosis not present

## 2021-02-19 DIAGNOSIS — Z87891 Personal history of nicotine dependence: Secondary | ICD-10-CM | POA: Diagnosis not present

## 2021-02-19 MED ORDER — ANORO ELLIPTA 62.5-25 MCG/INH IN AEPB: 1.0000 | INHALATION_SPRAY | Freq: Every day | RESPIRATORY_TRACT | 3 refills | Status: DC

## 2021-02-19 NOTE — Progress Notes (Signed)
Subjective:    Patient ID: April Raymond, female    DOB: 1961/07/17, 60 y.o.   MRN: [id number] Chief Complaint  Patient presents with  . Follow-up    HPI April Raymond is a 60 year old former smoker who presents for follow-up on the issue of dyspnea first noted in around September 2021 after a diagnosis of stress-induced cardiomyopathy.  This is a follow-up visit.  She was initially evaluated on 04 December 2020.  She is on Brillinta which of course can aggravate dyspnea however benefits outweigh symptoms and this circumstance.  We gave her a trial of Anoro Ellipta time she finds that this medication helps her tremendously.  She is now feeling markedly better with regards to her dyspnea.  It is now barely noticeable at all.  An alpha-1 antitrypsin phenotype and level obtain after her initial visit here showed that she has phenotype MZ and a slightly low level at 80 mg/dL.  We discussed the implications of this phenotype which is associated with carrier state however on occasion, patients with MZ phenotype require supplementation.  She will need to have her levels monitored every 6 months as she may require supplementation in the future.  This would not be done unless she gets as low as 50 mg/dL or below.  This was all explained to the patient.  Implications of carrier state with regards to her children and grandchildren were also explained to her and it was recommended that they get tested.  To her recollection no family members shows significant dyspnea though 1 grandchild has asthma.  Since her initial visit here she has been doing very well with Anoro as noted above.  She has not had any fevers, chills or sweats.  No cough or sputum production.  No chest pain.  No orthopnea or paroxysmal nocturnal dyspnea.  DATA: 10/05/2020 2D echo: Small to moderate size pericardial effusion without hemodynamic compromise, LVEF 60 to 65%.  Indeterminate diastolics.  Normal RV function.  No valvular abnormality 10/17/2020  PFTs: FEV1 1.71 L or 62% predicted, FVC 2.73 L or 76% predicted, FEV1/FVC 62%, no bronchodilator response.  Diffusion capacity mildly reduced.  Consistent with moderate COPD. 12/04/2020 alpha-1-antitrypsin: Phenotype MZ, level 80 mg/dl.  Review of Systems A 10 point review of systems was performed and it is as noted above otherwise negative.  Patient Active Problem List   Diagnosis Date Noted  . Alpha-1-antitrypsin deficiency carrier 02/20/2021  . Moderate COPD (chronic obstructive pulmonary disease) ([place]) 01/12/2021  . PAD (peripheral artery disease) ([place]) 09/22/2020  . Nonischemic cardiomyopathy ([place]) 05/31/2020  . Mass of skin of back 11/05/2019  . Former smoker 09/30/2019  . History of MI (myocardial infarction) 09/30/2019  . History of parathyroidectomy ([place]) 09/30/2019  . Coronary artery disease of native artery of native heart with stable angina pectoris ([place]) 09/30/2019  . Essential hypertension 09/30/2019  . Hyperlipidemia LDL goal <70 09/30/2019  . Hashimoto's disease 09/30/2019  . Vaginal atrophy 09/30/2019  . Hidradenitis suppurativa 09/30/2019   Social History   Tobacco Use  . Smoking status: Former Smoker    Packs/day: 1.00    Years: 40.00    Pack years: 40.00    Types: Cigarettes    Quit date: 09/18/2015    Years since quitting: 5.4  . Smokeless tobacco: Never Used  Substance Use Topics  . Alcohol use: Yes    Comment: on occasion   Allergies  Allergen Reactions  . Humira [Adalimumab] Rash   Current Meds  Medication Sig  . Albuterol Sulfate (PROAIR  RESPICLICK) 419 (90 Base) MCG/ACT AEPB Inhale 2 puffs into the lungs every 6 (six) hours as needed (sob/wheezing).  Marland Kitchen atorvastatin (LIPITOR) 80 MG tablet Take 1 tablet (80 mg total) by mouth daily.  Marland Kitchen BRILINTA 60 MG TABS tablet TAKE 1 TABLET (60 MG TOTAL) BY MOUTH 2 (TWO) TIMES DAILY.  Marland Kitchen conjugated estrogens (PREMARIN) vaginal cream Place 0.5 grams twice weekly intravaginally.  . furosemide (LASIX) 20 MG tablet  Take 1 tablet (20 mg total) by mouth as directed. Take once daily and the PM pill to be taken as needed for shortness of breath or swelling  . levothyroxine (SYNTHROID) 75 MCG tablet TAKE 1 TABLET (75 MCG TOTAL) BY MOUTH DAILY BEFORE BREAKFAST.  Marland Kitchen losartan (COZAAR) 25 MG tablet Take 0.5 tablets (12.5 mg total) by mouth daily.  . metoprolol succinate (TOPROL-XL) 25 MG 24 hr tablet TAKE 1/2 TABLET BY MOUTH EVERY DAY  . nitroGLYCERIN (NITROSTAT) 0.4 MG SL tablet Place 1 tablet (0.4 mg total) under the tongue every 5 (five) minutes as needed for chest pain.  . pantoprazole (PROTONIX) 40 MG tablet TAKE 1 TABLET BY MOUTH EVERY DAY   Immunization History  Administered Date(s) Administered  . Influenza,inj,Quad PF,6+ Mos 05/19/2020  . Influenza-Unspecified 07/19/2019  . Moderna SARS-COV2 Booster Vaccination 01/12/2021  . Moderna Sars-Covid-2 Vaccination 12/16/2019, 01/13/2020, 08/08/2020  . Tdap 03/21/2020      Objective:   Physical Exam BP 126/74 (BP Location: Left Arm, Cuff Size: Normal)   Pulse 79   Temp 97.8 F (36.6 C) (Temporal)   Ht 5\' 6"  (1.676 m)   Wt 144 lb 9.6 oz (65.6 kg)   LMP 11/16/2007 (Approximate)   SpO2 96%   BMI 23.34 kg/m  GENERAL: Well-developed, well-nourished woman in no acute distress.  Fully ambulatory.  No conversational dyspnea. HEAD: Normocephalic, atraumatic.  EYES: Pupils equal, round, reactive to light.  No scleral icterus.  MOUTH: Nose/mouth/throat not examined due to masking requirements for COVID 19. NECK: Supple. No thyromegaly. Trachea midline. No JVD.  No adenopathy. PULMONARY: Good air entry bilaterally. No adventitious sounds. CARDIOVASCULAR: S1 and S2. Regular rate and rhythm.  No rubs, murmurs or gallops heard. ABDOMEN: Benign. MUSCULOSKELETAL: No joint deformity, no clubbing, no edema.  NEUROLOGIC: No focal deficit, no gait disturbance, speech is fluent. SKIN: Intact,warm,dry.  No rashes. PSYCH: Mood and behavior normal.     Assessment &  Plan:     ICD-10-CM   1. COPD, moderate ([place])  J44.9 Alpha-1-antitrypsin   Continue Anoro and as needed albuterol Monitor with PFTs yearly  2. Alpha-1-antitrypsin deficiency carrier  Z14.8    Mildly decreased alpha-1 levels Check alpha-1 levels every 6 months May need supplementation if level below 50 mg/dL  3. Shortness of breath  R06.02    Markedly improved with Anoro Continue to remain active  4. Former smoker  Z87.891    No evidence of relapse   Orders Placed This Encounter  Procedures  . Alpha-1-antitrypsin    Standing Status:   Future    Standing Expiration Date:   02/19/2022   Meds ordered this encounter  Medications  . umeclidinium-vilanterol (ANORO ELLIPTA) 62.5-25 MCG/INH AEPB    Sig: Inhale 1 puff into the lungs daily.    Dispense:  180 each    Refill:  3   We discussed the implications of alpha-1 MZ phenotype for the patient and for family members.  Patient was given information from [employer] [facility] [employer].  She was encouraged to visit the site as well.  We will see  the patient in follow-up in 4 months time she is to contact us prior to that time should any new difficulties arise.  Renold Don, MD [facility]   *This note was dictated using voice recognition software/Dragon.  Despite best efforts to proofread, errors can occur which can change the meaning.  Any change was purely unintentional.

## 2021-02-19 NOTE — Patient Instructions (Addendum)
As we discussed today on alpha-1 antitrypsin CARRIER this does not mean that you have the disease per se.  You can find more information through the alpha-1 foundation.  Continue using Anoro.  You have done the best thing you could have done for your lungs by quitting smoking.  Your COPD is classified as moderate.  We will check breathing test once a year to keep monitoring this.  Continue to be active as this helps your lungs as well.  We will see you in follow-up in 4 months time call sooner should any new problems arise.  We will check another alpha 1 level at that time.

## 2021-02-20 ENCOUNTER — Encounter: Payer: Self-pay | Admitting: Pulmonary Disease

## 2021-02-20 ENCOUNTER — Other Ambulatory Visit: Payer: Self-pay | Admitting: Nurse Practitioner

## 2021-02-20 DIAGNOSIS — Z148 Genetic carrier of other disease: Secondary | ICD-10-CM | POA: Insufficient documentation

## 2021-02-20 DIAGNOSIS — Z1231 Encounter for screening mammogram for malignant neoplasm of breast: Secondary | ICD-10-CM

## 2021-02-20 HISTORY — DX: Genetic carrier of other disease: Z14.8

## 2021-02-23 ENCOUNTER — Ambulatory Visit: Payer: No Typology Code available for payment source | Admitting: Thoracic Surgery (Cardiothoracic Vascular Surgery)

## 2021-03-20 ENCOUNTER — Other Ambulatory Visit: Payer: Self-pay | Admitting: Cardiovascular Disease

## 2021-03-23 ENCOUNTER — Other Ambulatory Visit: Payer: Self-pay

## 2021-03-23 ENCOUNTER — Ambulatory Visit: Payer: No Typology Code available for payment source | Admitting: Nurse Practitioner

## 2021-03-23 ENCOUNTER — Encounter: Payer: Self-pay | Admitting: Nurse Practitioner

## 2021-03-23 VITALS — BP 116/69 | HR 76 | Temp 98.5°F | Wt 144.2 lb

## 2021-03-23 DIAGNOSIS — I428 Other cardiomyopathies: Secondary | ICD-10-CM | POA: Diagnosis not present

## 2021-03-23 DIAGNOSIS — I10 Essential (primary) hypertension: Secondary | ICD-10-CM | POA: Diagnosis not present

## 2021-03-23 DIAGNOSIS — Z87891 Personal history of nicotine dependence: Secondary | ICD-10-CM

## 2021-03-23 DIAGNOSIS — J449 Chronic obstructive pulmonary disease, unspecified: Secondary | ICD-10-CM

## 2021-03-23 DIAGNOSIS — Z23 Encounter for immunization: Secondary | ICD-10-CM

## 2021-03-23 DIAGNOSIS — I25118 Atherosclerotic heart disease of native coronary artery with other forms of angina pectoris: Secondary | ICD-10-CM

## 2021-03-23 DIAGNOSIS — Z148 Genetic carrier of other disease: Secondary | ICD-10-CM

## 2021-03-23 DIAGNOSIS — E785 Hyperlipidemia, unspecified: Secondary | ICD-10-CM

## 2021-03-23 DIAGNOSIS — E063 Autoimmune thyroiditis: Secondary | ICD-10-CM

## 2021-03-23 DIAGNOSIS — Z1159 Encounter for screening for other viral diseases: Secondary | ICD-10-CM

## 2021-03-23 MED ORDER — SHINGRIX 50 MCG/0.5ML IM SUSR: 0.5000 mL | Freq: Once | INTRAMUSCULAR | 0 refills | Status: DC

## 2021-03-23 MED ORDER — SHINGRIX 50 MCG/0.5ML IM SUSR: 0.5000 mL | Freq: Once | INTRAMUSCULAR | 0 refills | Status: AC

## 2021-03-23 NOTE — Assessment & Plan Note (Signed)
New diagnosis with pulmonary, continue this collaboration with current inhaler regimen.

## 2021-03-23 NOTE — Progress Notes (Signed)
BP 116/69   Pulse 76   Temp 98.5 F (36.9 C) (Oral)   Wt 144 lb 3.2 oz (65.4 kg)   LMP 11/16/2007 (Approximate)   SpO2 99%   BMI 23.27 kg/m    Subjective:    Patient ID: April Raymond, female    DOB: [birth year], 60 y.o.   MRN: [id number]  HPI: April Raymond is a 60 y.o. female  Chief Complaint  Patient presents with   Hyperlipidemia   Hypertension   Thyroid Problem   Chronic Kidney Disease   HYPERTENSION WITH CAD & HLD Followed by cardiology , last visit 10/06/20, stress-induced cardiomyopathy and angina.  History of MI at age 78.  Has a family history of MI, mother in her 65's and her father's dad passed in his 49's.  Had parathyroid removed in 2018, history of hyperparathyroid.  She continues on Ticagrelor, Metoprolol, Losartan, Lasix daily and PRN (most often taking second pill), and NTG.  Has not had to use any NTG and is beginning to exercise again.  Echo on 10/05/20 noted EF 60-65%. Taking Atorvastatin 80 MG. Hypertension status: stable  Satisfied with current treatment? yes Duration of hypertension: chronic BP monitoring frequency:  occasionally BP range:  BP medication side effects:  no Medication compliance: good compliance Aspirin: ticagrelor, ASA Recurrent headaches: no Visual changes: no Palpitations: no Dyspnea: mild at times Chest pain: none recently Lower extremity edema: no Dizzy/lightheaded: no  The ASCVD Risk score ([place]., et al., 2013) failed to calculate for the following reasons:   The patient has a prior MI or stroke diagnosis   CHRONIC KIDNEY DISEASE Noted on past labs with recent GFR 51 and CRT 1.16. CKD status: stable Medications renally dose: yes Previous renal evaluation: no Pneumovax:  Not up to Date Influenza Vaccine:  Up to Date  HYPOTHYROIDISM Continues on Levothyroxine 75 MCG daily.  Last TSH in January 3.330. Thyroid control status:stable Satisfied with current treatment? yes Medication side effects: no Medication  compliance: good compliance Etiology of hypothyroidism:  Recent dose adjustment:no Fatigue: no Cold intolerance: yes Heat intolerance: no Weight gain: no Weight loss: no Constipation: no Diarrhea/loose stools: sometimes Palpitations: no Lower extremity edema: no Anxiety/depressed mood: no  COPD History of smoking, quit at 55 -- smoked 1 PPD.  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  Was started on Anoro and has Albuterol to use as needed, but has not needed to use. COPD status: stable Satisfied with current treatment?: yes Oxygen use: no Dyspnea frequency: none Cough frequency: none Rescue inhaler frequency:  none Limitation of activity: no Productive cough: none Last Spirometry: 2022 Pneumovax: Up to Date Influenza: Up to Date   Relevant past medical, surgical, family and social history reviewed and updated as indicated. Interim medical history since our last visit reviewed. Allergies and medications reviewed and updated.  Review of Systems  Constitutional:  Negative for activity change, appetite change, diaphoresis, fatigue and fever.  Respiratory:  Negative for cough, chest tightness, shortness of breath and wheezing.   Cardiovascular:  Negative for chest pain, palpitations and leg swelling.  Gastrointestinal: Negative.   Endocrine: Negative for cold intolerance and heat intolerance.  Neurological: Negative.   Psychiatric/Behavioral: Negative.     Per HPI unless specifically indicated above     Objective:    BP 116/69   Pulse 76   Temp 98.5 F (36.9 C) (Oral)   Wt 144 lb 3.2 oz (65.4 kg)   LMP 11/16/2007 (Approximate)   SpO2 99%  BMI 23.27 kg/m   Wt Readings from Last 3 Encounters:  03/23/21 144 lb 3.2 oz (65.4 kg)  02/19/21 144 lb 9.6 oz (65.6 kg)  12/04/20 146 lb (66.2 kg)    Physical Exam Vitals and nursing note reviewed.  Constitutional:      General: She is awake. She is not in acute distress.    Appearance: She is  well-developed and well-groomed. She is not ill-appearing.  HENT:     Head: Normocephalic.     Right Ear: Hearing normal.     Left Ear: Hearing normal.  Eyes:     General: Lids are normal.        Right eye: No discharge.        Left eye: No discharge.     Conjunctiva/sclera: Conjunctivae normal.     Pupils: Pupils are equal, round, and reactive to light.  Cardiovascular:     Rate and Rhythm: Normal rate and regular rhythm.     Heart sounds: Normal heart sounds. No murmur heard.   No gallop.  Pulmonary:     Effort: Pulmonary effort is normal. No accessory muscle usage or respiratory distress.     Breath sounds: Normal breath sounds.  Abdominal:     General: Bowel sounds are normal.     Palpations: Abdomen is soft.  Musculoskeletal:     Cervical back: Normal range of motion and neck supple.     Right lower leg: No edema.     Left lower leg: No edema.  Skin:    General: Skin is warm and dry.  Neurological:     Mental Status: She is alert and oriented to person, place, and time.  Psychiatric:        Attention and Perception: Attention normal.        Mood and Affect: Mood normal.        Speech: Speech normal.        Behavior: Behavior normal. Behavior is cooperative.        Thought Content: Thought content normal.   Results for orders placed or performed during the hospital encounter of 12/04/20  Alpha-1 antitrypsin phenotype  Result Value Ref Range   A-1 Antitrypsin Pheno MZ    A-1 Antitrypsin, Ser 80 (L) 101 - 187 mg/dL      Assessment & Plan:   Problem List Items Addressed This Visit       Cardiovascular and Mediastinum   Coronary artery disease of native artery of native heart with stable angina pectoris ([place]) - Primary    Continue Ticagrelor, ASA, and Atorvastatin for prevention + collaboration with cardiology.  High risk family and patient history.       Essential hypertension    Chronic, ongoing with BP well below goal at this time. Will continue current  medication regimen and adjust as needed.  Continue collaboration with cardiology due to her significant family and personal cardiac history.  Recommend she monitor BP at least a few mornings a week at home and document.  DASH diet at home. Labs today: CMP and lipid.  Return in 6 months.        Relevant Orders   Comprehensive metabolic panel   Nonischemic cardiomyopathy ([place])    Stable, followed by cardiology.  Continue current medication regimen and collaboration with cardiology.         Respiratory   Moderate COPD (chronic obstructive pulmonary disease) ([facility])    New diagnosis with pulmonary in patient with history of smoking. Continue current collaboration with  pulmonary and inhaler regimen as ordered by them.  Recommend continued cessation of smoking.         Endocrine   Hashimoto's disease    Chronic, ongoing.  Continue current medication regimen and adjust as needed.  Thyroid labs next visit.         Other   Former smoker    Recommend continued cessation of smoking.       Hyperlipidemia LDL goal <70    Chronic, ongoing with history of MI.  Continue statin and adjust dose as needed. Could consider Rosuvastatin in future if any issues with Atorvastatin.  Lipid panel today, she is not fasting.       Relevant Orders   Lipid Panel w/o Chol/HDL Ratio   Alpha-1-antitrypsin deficiency carrier    New diagnosis with pulmonary, continue this collaboration with current inhaler regimen.       Other Visit Diagnoses     Need for hepatitis C screening test       Hep C screening on labs today per 1 time guideline recommendation, discussed with patient.   Relevant Orders   Hepatitis C antibody   Need for shingles vaccine       Shingrix ordered to pharmacy        Follow up plan: Return in about 5 months (around 09/04/2021) for Annual physical.

## 2021-03-23 NOTE — Assessment & Plan Note (Signed)
New diagnosis with pulmonary in patient with history of smoking. Continue current collaboration with pulmonary and inhaler regimen as ordered by them.  Recommend continued cessation of smoking.

## 2021-03-23 NOTE — Assessment & Plan Note (Addendum)
Chronic, ongoing with history of MI.  Continue statin and adjust dose as needed. Could consider Rosuvastatin in future if any issues with Atorvastatin.  Lipid panel today, she is not fasting.

## 2021-03-23 NOTE — Assessment & Plan Note (Signed)
Continue Ticagrelor, ASA, and Atorvastatin for prevention + collaboration with cardiology.  High risk family and patient history.

## 2021-03-23 NOTE — Assessment & Plan Note (Signed)
Chronic, ongoing with BP well below goal at this time. Will continue current medication regimen and adjust as needed.  Continue collaboration with cardiology due to her significant family and personal cardiac history.  Recommend she monitor BP at least a few mornings a week at home and document.  DASH diet at home. Labs today: CMP and lipid.  Return in 6 months.

## 2021-03-23 NOTE — Assessment & Plan Note (Signed)
Stable, followed by cardiology.  Continue current medication regimen and collaboration with cardiology. 

## 2021-03-23 NOTE — Patient Instructions (Signed)

## 2021-03-23 NOTE — Assessment & Plan Note (Signed)
Recommend continued cessation of smoking.

## 2021-03-23 NOTE — Assessment & Plan Note (Signed)
Chronic, ongoing.  Continue current medication regimen and adjust as needed.  Thyroid labs next visit.

## 2021-03-24 LAB — COMPREHENSIVE METABOLIC PANEL
ALT: 22 IU/L (ref 0–32)
AST: 28 IU/L (ref 0–40)
Albumin/Globulin Ratio: 2.4 — ABNORMAL HIGH (ref 1.2–2.2)
Albumin: 5 g/dL — ABNORMAL HIGH (ref 3.8–4.8)
Alkaline Phosphatase: 51 IU/L (ref 44–121)
BUN/Creatinine Ratio: 27 (ref 12–28)
BUN: 25 mg/dL (ref 8–27)
Bilirubin Total: 0.4 mg/dL (ref 0.0–1.2)
CO2: 25 mmol/L (ref 20–29)
Calcium: 9.9 mg/dL (ref 8.7–10.3)
Chloride: 102 mmol/L (ref 96–106)
Creatinine, Ser: 0.93 mg/dL (ref 0.57–1.00)
Globulin, Total: 2.1 g/dL (ref 1.5–4.5)
Glucose: 90 mg/dL (ref 65–99)
Potassium: 4 mmol/L (ref 3.5–5.2)
Sodium: 143 mmol/L (ref 134–144)
Total Protein: 7.1 g/dL (ref 6.0–8.5)
eGFR: 70 mL/min/{1.73_m2} (ref >59)

## 2021-03-24 LAB — LIPID PANEL W/O CHOL/HDL RATIO
Cholesterol, Total: 147 mg/dL (ref 100–199)
HDL: 61 mg/dL (ref >39)
LDL Chol Calc (NIH): 58 mg/dL (ref 0–99)
Triglycerides: 170 mg/dL — ABNORMAL HIGH (ref 0–149)
VLDL Cholesterol Cal: 28 mg/dL (ref 5–40)

## 2021-03-24 LAB — HEPATITIS C ANTIBODY: Hep C Virus Ab: 0.1 s/co ratio (ref 0.0–0.9)

## 2021-03-24 NOTE — Progress Notes (Signed)
Contacted via MyChart   Good evening April Raymond, overall labs continue to be stable.  Great job!!  Keep being awesome!!  Thank you for allowing me to participate in your care.  I appreciate you. Kindest regards, Amanada Philbrick

## 2021-04-19 ENCOUNTER — Ambulatory Visit: Payer: No Typology Code available for payment source | Admitting: Dermatology

## 2021-04-22 IMAGING — CR DG CERVICAL SPINE COMP WITH FLEX & EXTEND
1 series · 7 of 7 positions shown · non-contrast
Comparison: None.

CLINICAL DATA: Neck and bilateral shoulder pain

EXAM:
CERVICAL SPINE COMPLETE WITH FLEXION AND EXTENSION VIEWS

[Series 1: dg cervical spine with flex & extend · 0.14mm/px · 7 of 7 slices shown]
[im 1/7]
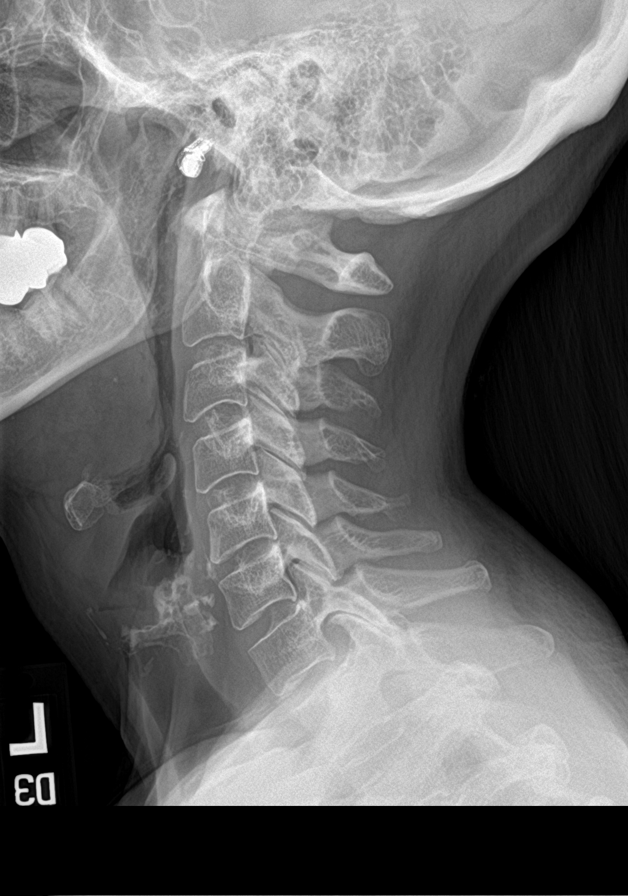
[im 2/7]
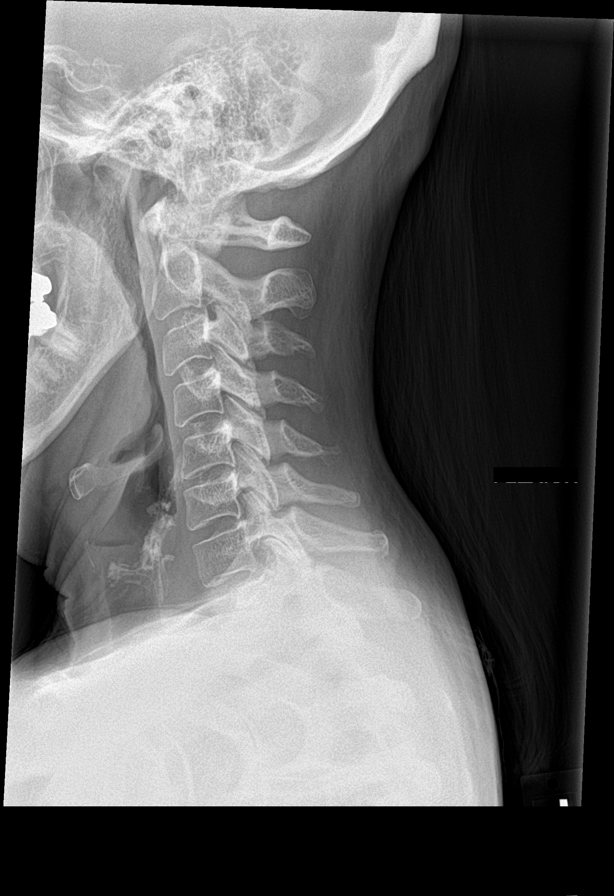
[im 3/7]
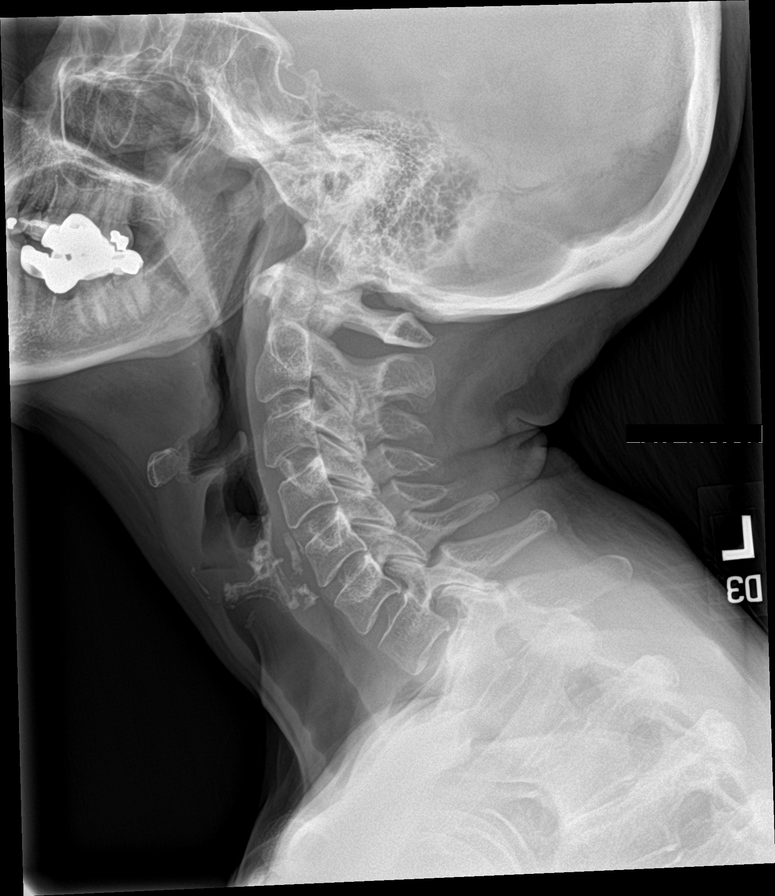
[im 4/7]
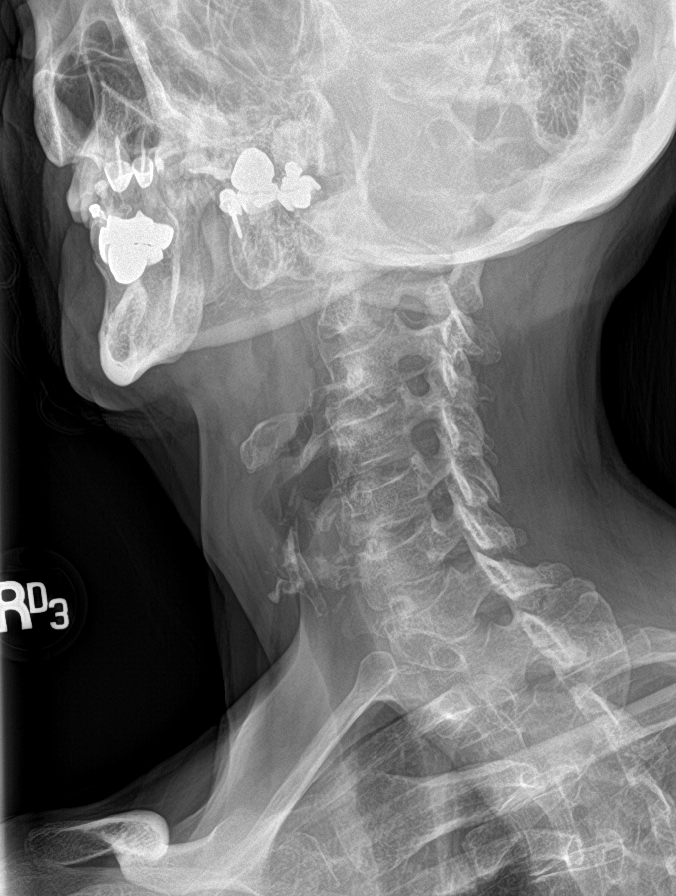
[im 5/7]
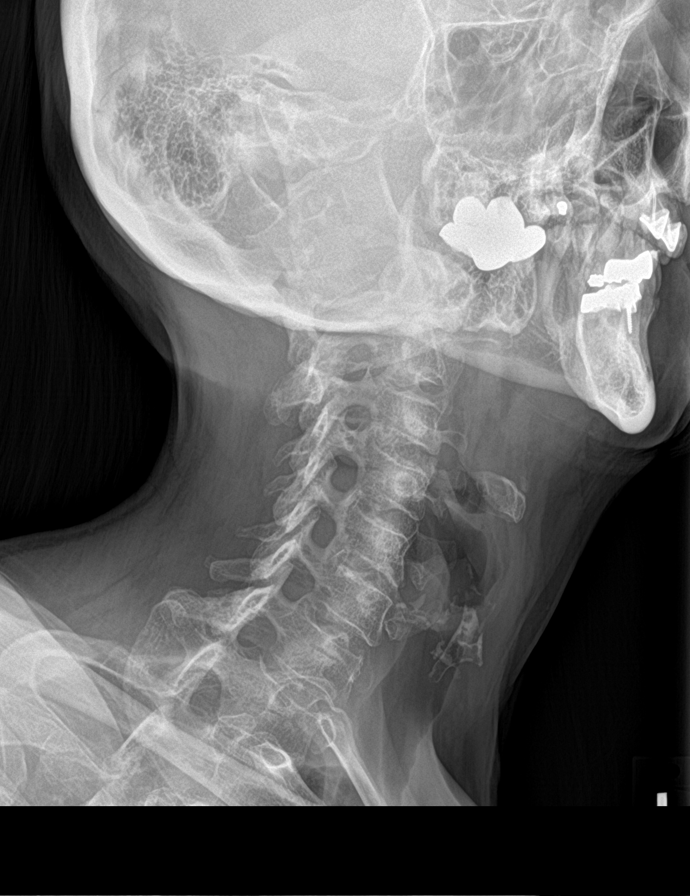
[im 6/7]
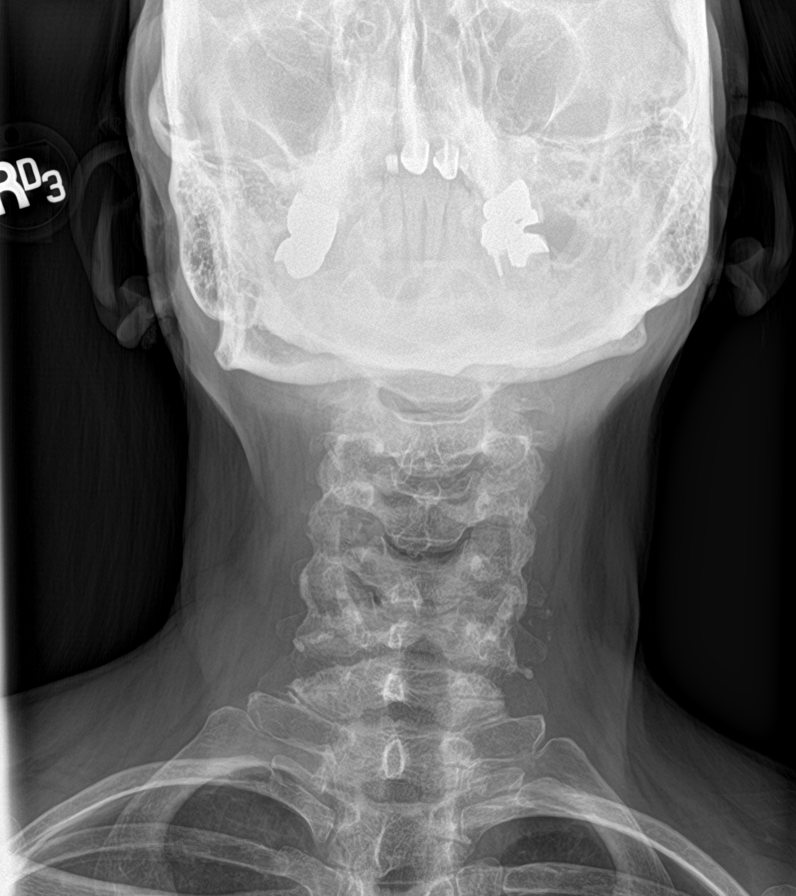
[im 7/7]
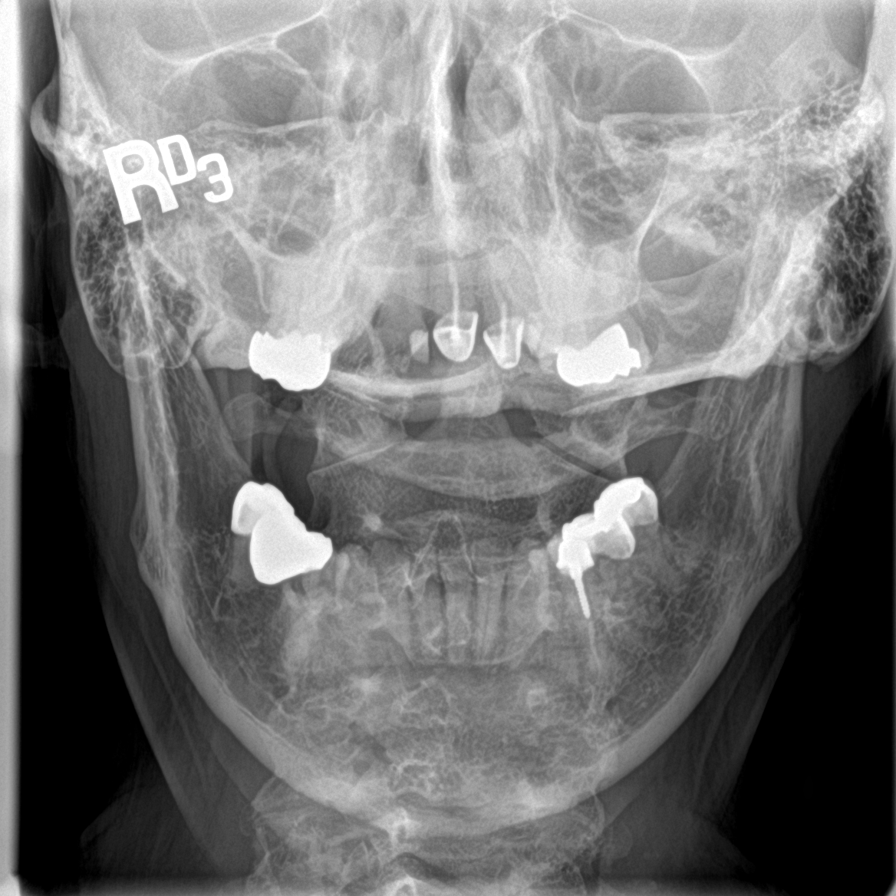

[7 of 7 positions shown; findings below may reference images not displayed]

FINDINGS: Mild narrowing of the C5-6 interspace. Normal alignment. No fracture
or dislocation. No significant osseous degenerative change. No
osseous foraminal encroachment. Multiple dental restorations. No
dynamic instability on lateral flexion/extension.
IMPRESSION: Early degenerative disc disease C5-6.

No fracture or other bone abnormalities.

## 2021-04-22 IMAGING — CR DG SHOULDER 2+V*R*
1 series · 3 of 3 positions shown · non-contrast
Comparison: None.

CLINICAL DATA: Neck and bilateral shoulder pain

EXAM:
RIGHT SHOULDER - 2+ VIEW

[Series 1: dg shoulder right · 0.14mm/px · 3 of 3 slices shown]
[im 1/3]
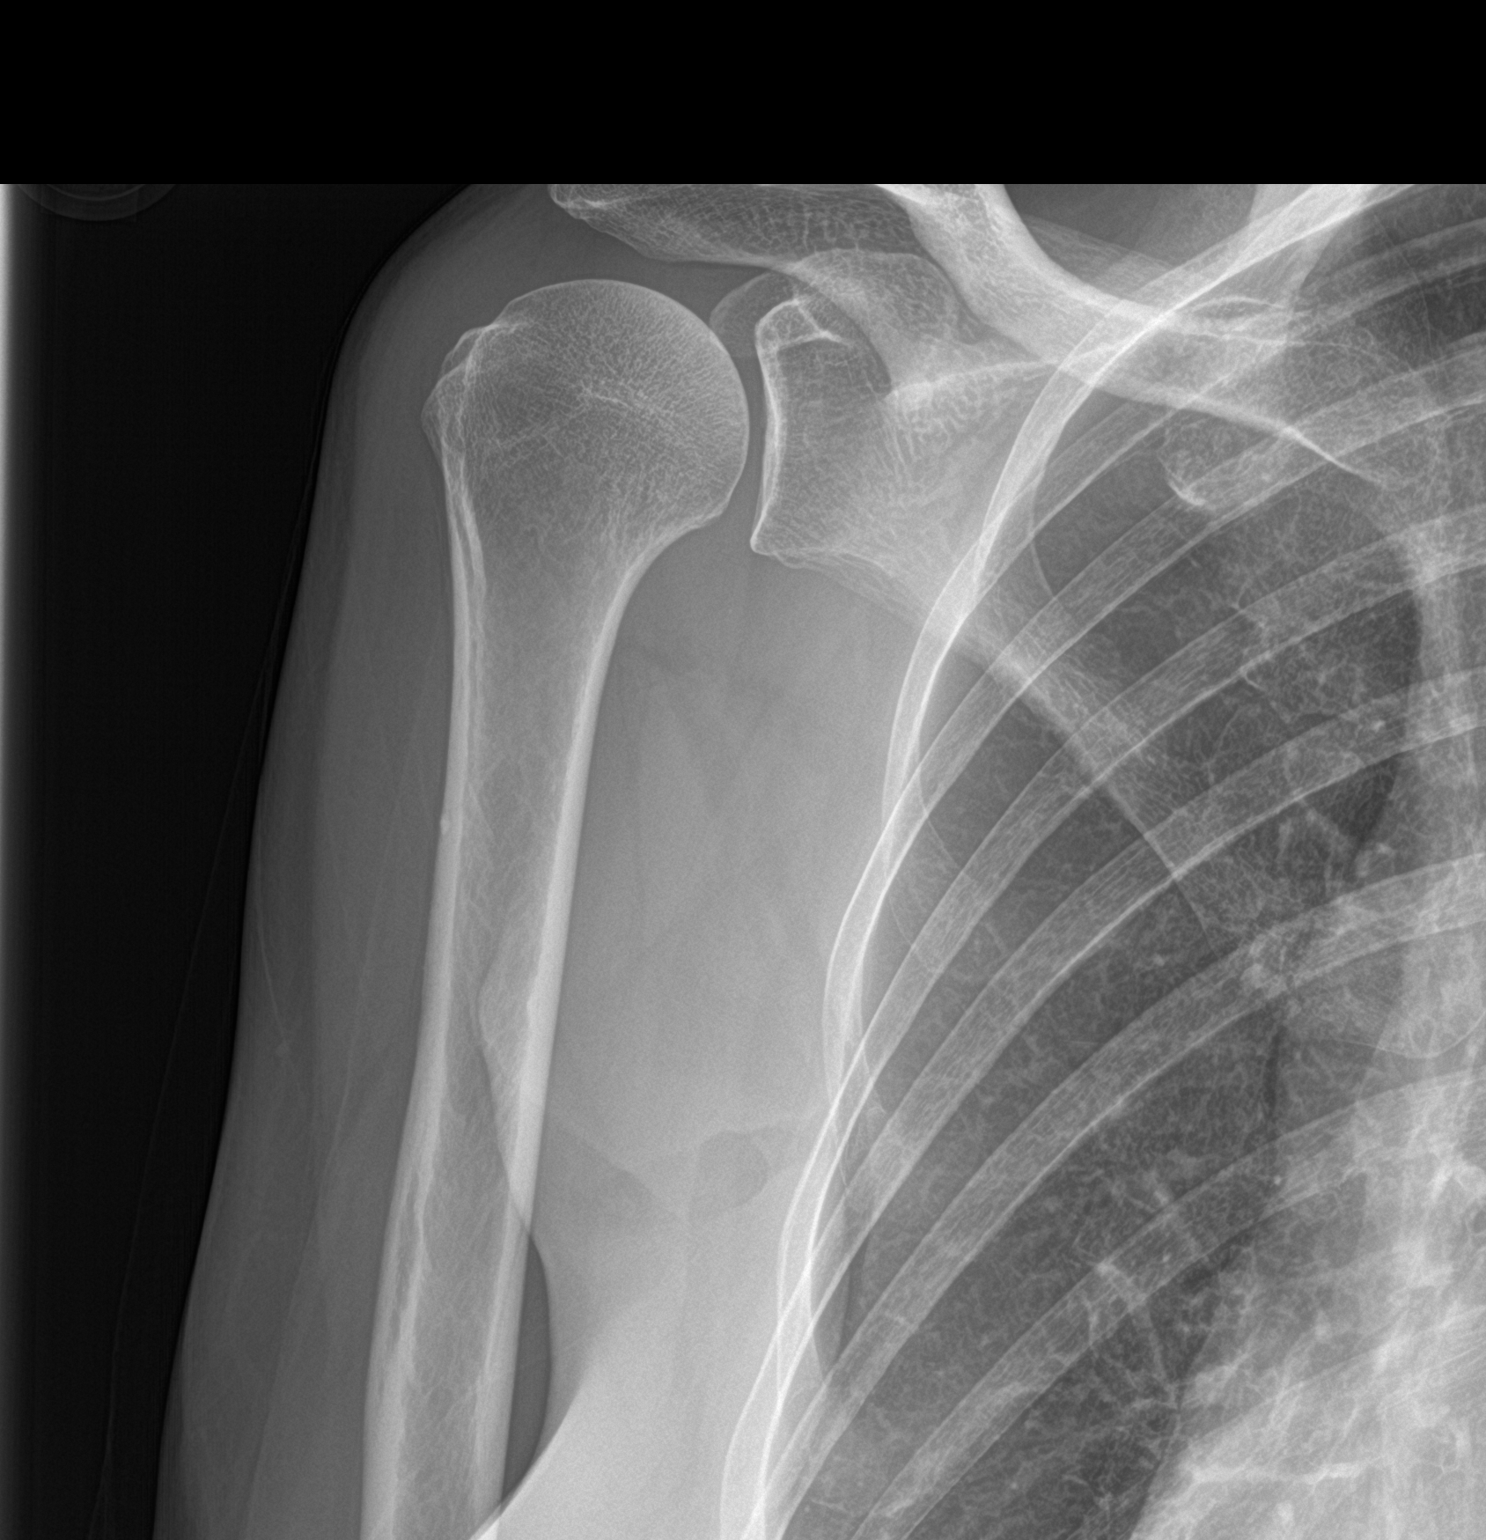
[im 2/3]
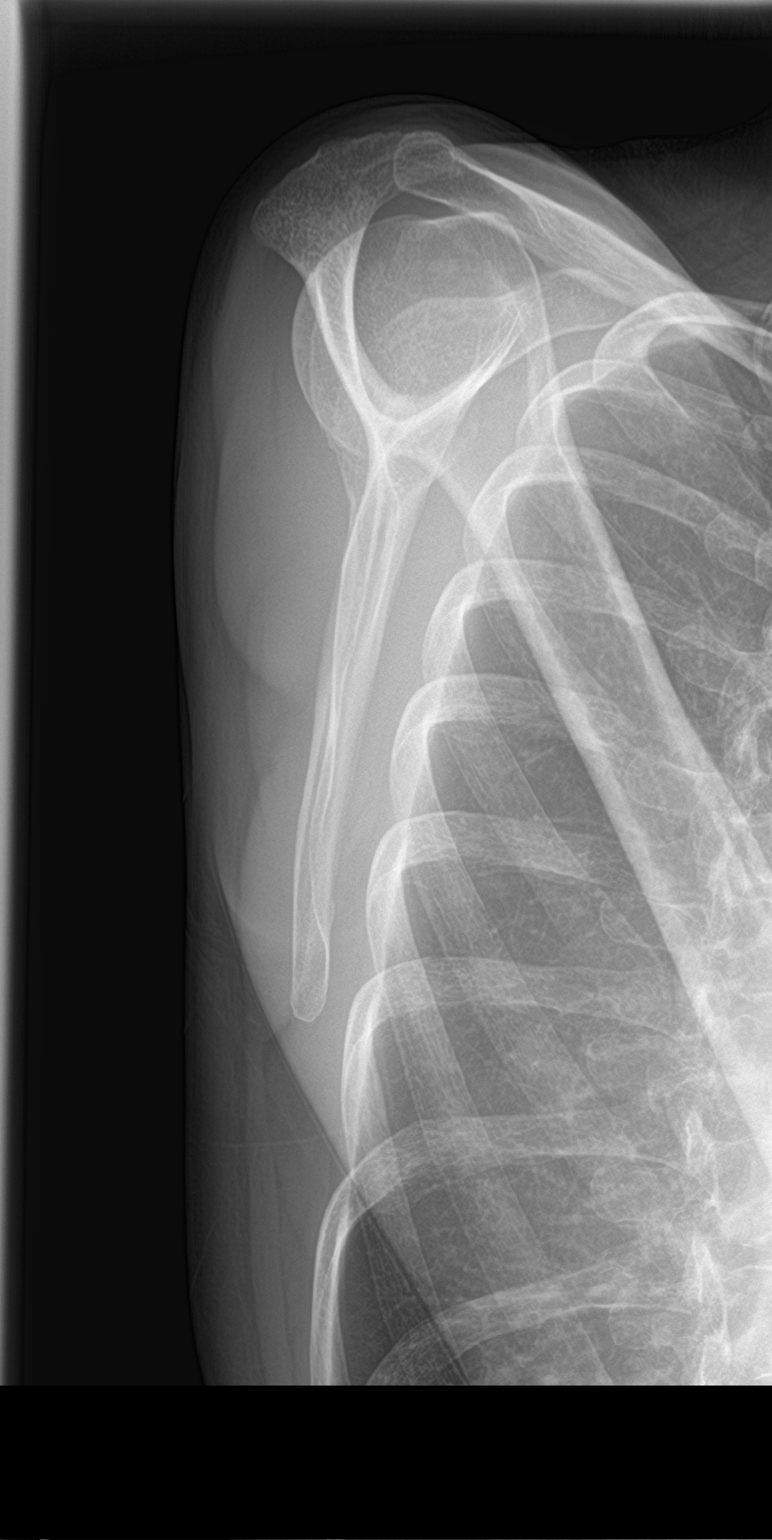
[im 3/3]
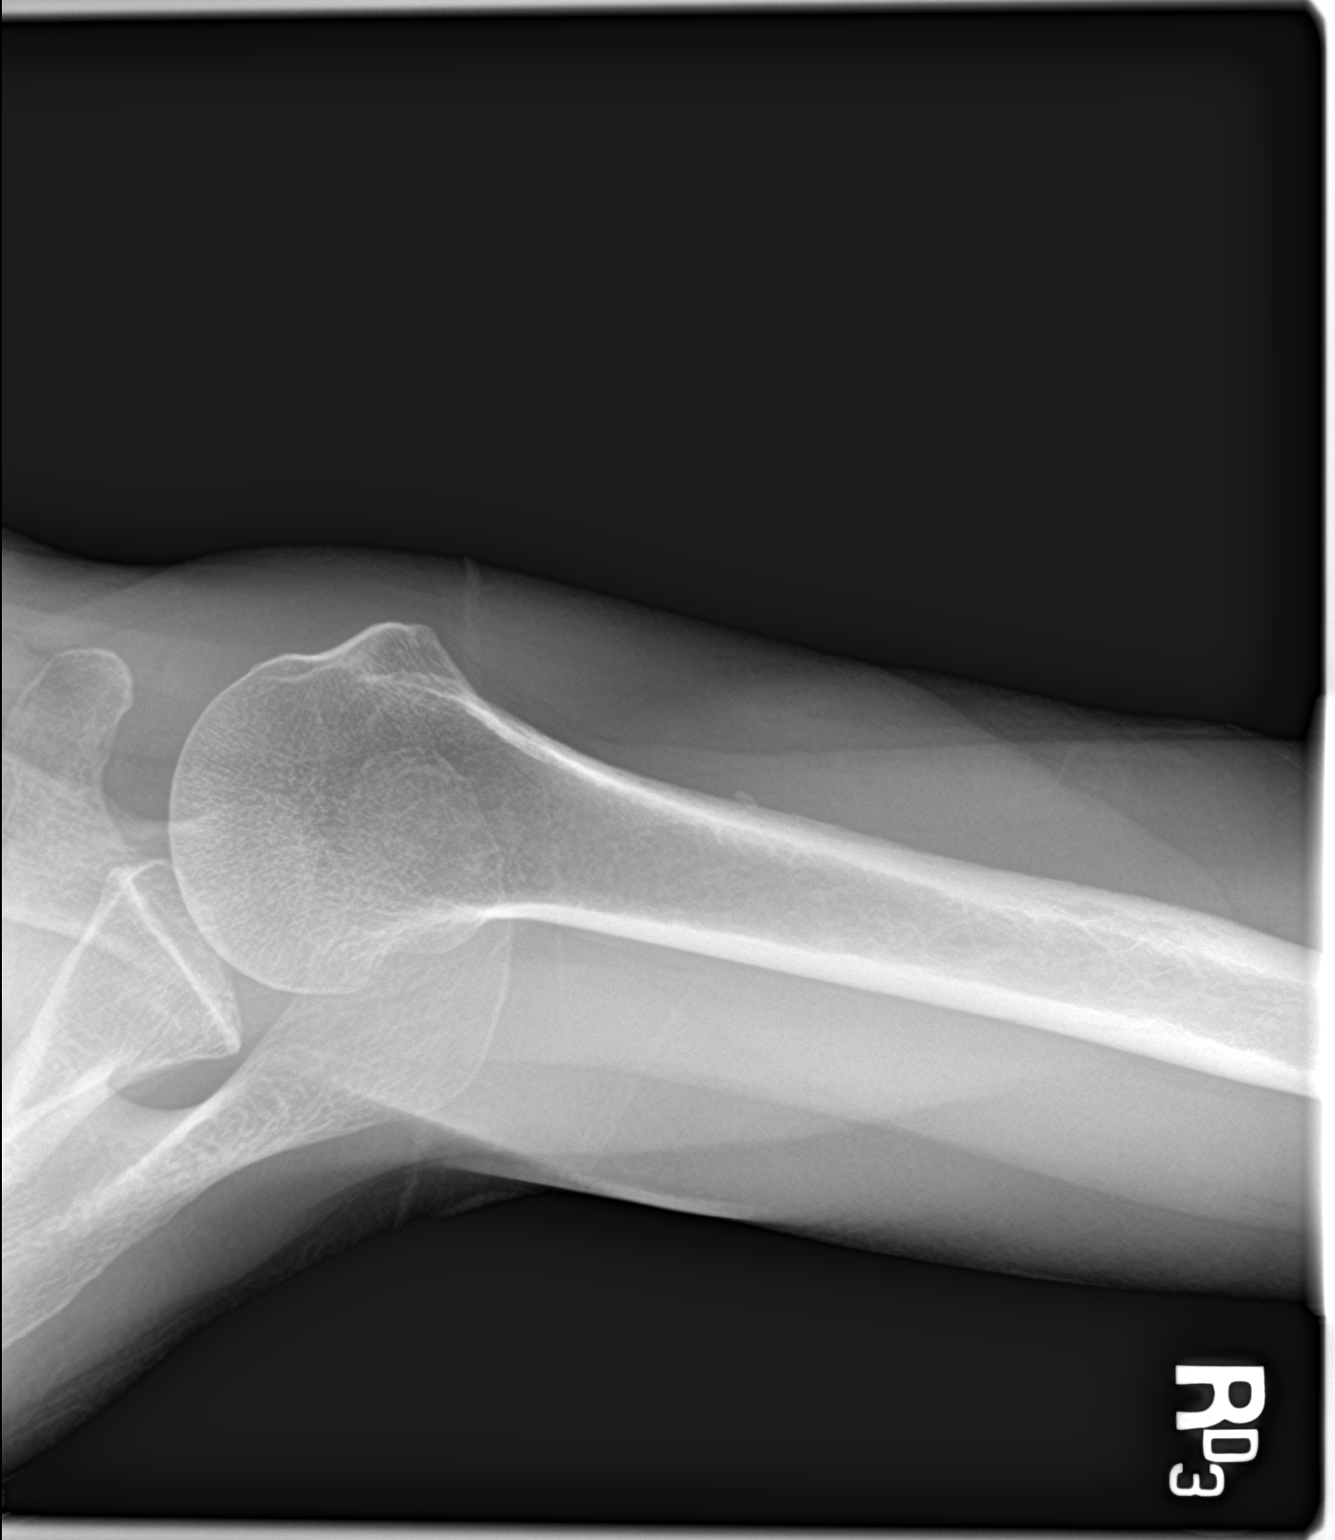

[3 of 3 positions shown; findings below may reference images not displayed]

FINDINGS: There is no evidence of fracture or dislocation. There is no
evidence of arthropathy or other focal bone abnormality. Soft
tissues are unremarkable.
IMPRESSION: Negative.

## 2021-04-24 ENCOUNTER — Other Ambulatory Visit: Payer: Self-pay

## 2021-04-24 ENCOUNTER — Ambulatory Visit
Admission: RE | Admit: 2021-04-24 | Discharge: 2021-04-24 | Disposition: A | Discharge status: Home / Self Care | Payer: BLUE CROSS/BLUE SHIELD | Source: Ambulatory Visit | Attending: Nurse Practitioner | Admitting: Nurse Practitioner

## 2021-04-24 DIAGNOSIS — Z1231 Encounter for screening mammogram for malignant neoplasm of breast: Secondary | ICD-10-CM | POA: Diagnosis not present

## 2021-04-24 IMAGING — MG MM DIGITAL SCREENING BILAT W/ TOMO AND CAD
8 series · 8 of 24 positions shown · non-contrast
Comparison: Previous exam(s).

CLINICAL DATA: Screening.

EXAM:
DIGITAL SCREENING BILATERAL MAMMOGRAM WITH TOMOSYNTHESIS AND CAD
TECHNIQUE: Bilateral screening digital craniocaudal and mediolateral oblique
mammograms were obtained. Bilateral screening digital breast
tomosynthesis was performed. The images were evaluated with
computer-aided detection.

[L MLO synth-2D]
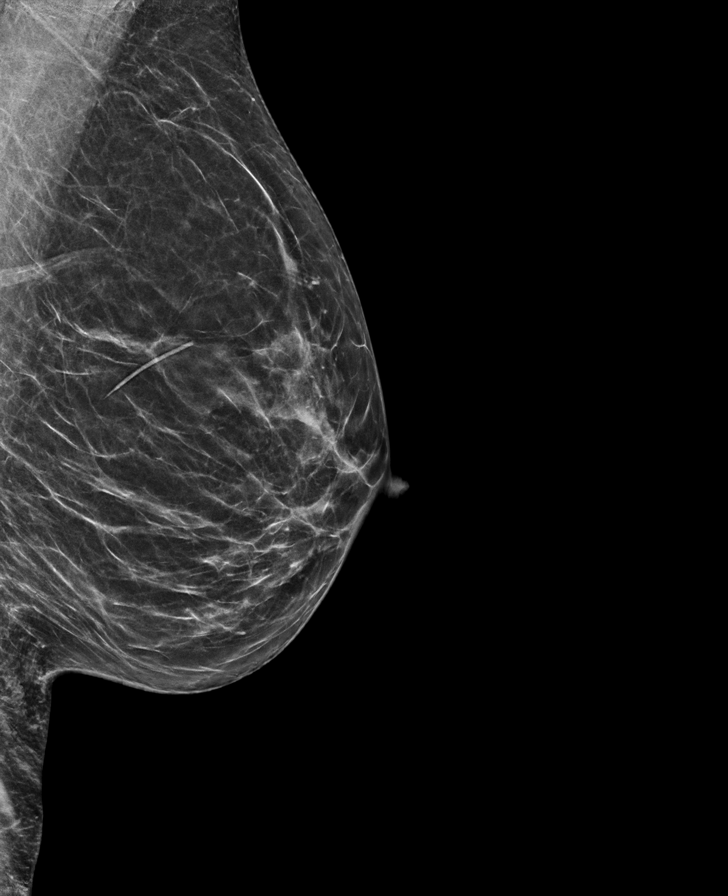

[L CC synth-2D]
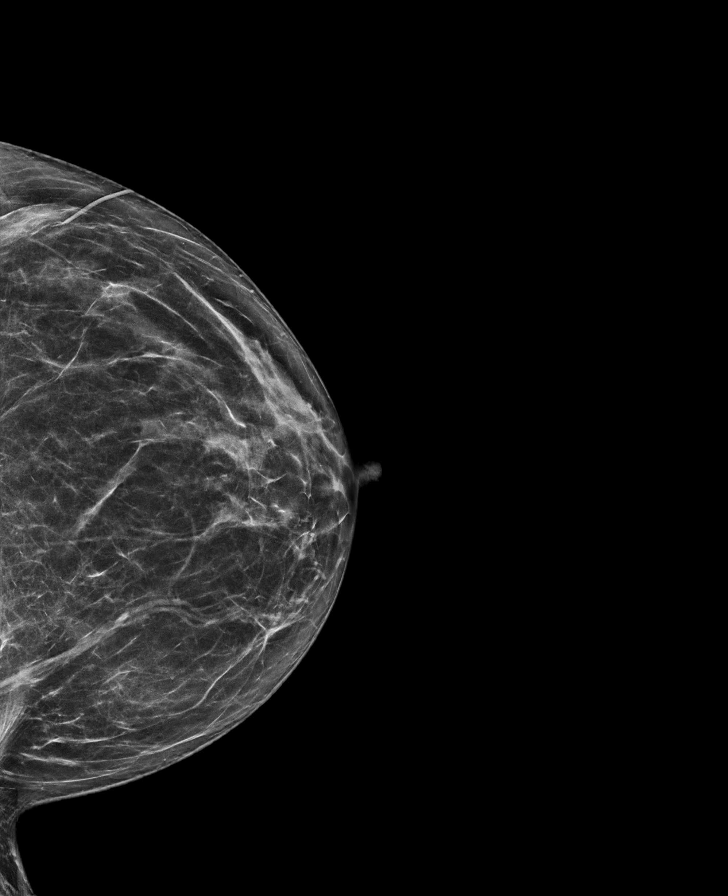

[R MLO synth-2D]
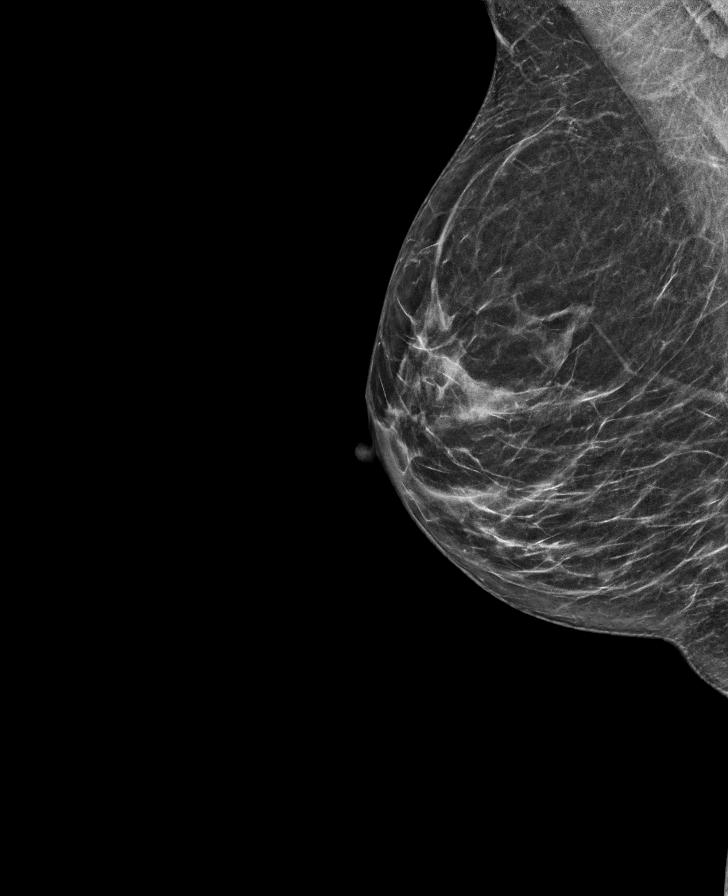

[R CC synth-2D]
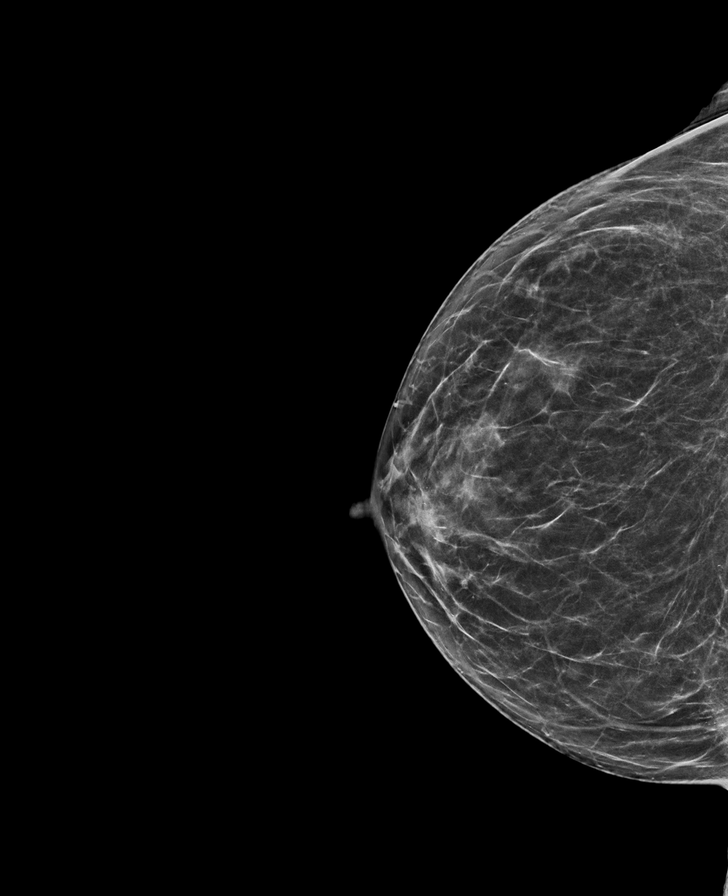

[R MLO tomo · tomo slice 30/59.0]
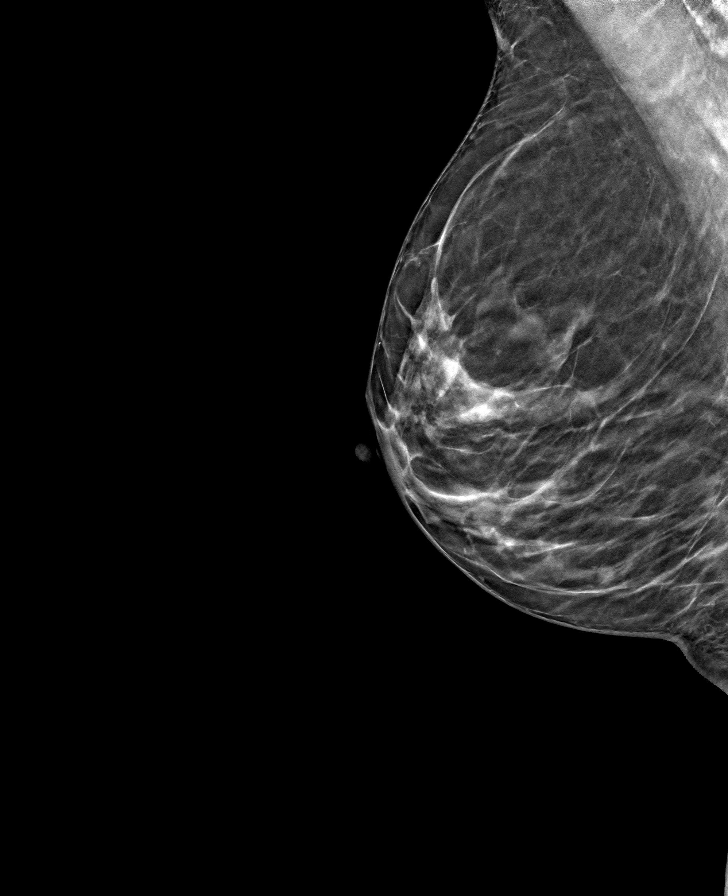

[L MLO tomo · tomo slice 31/60.0]
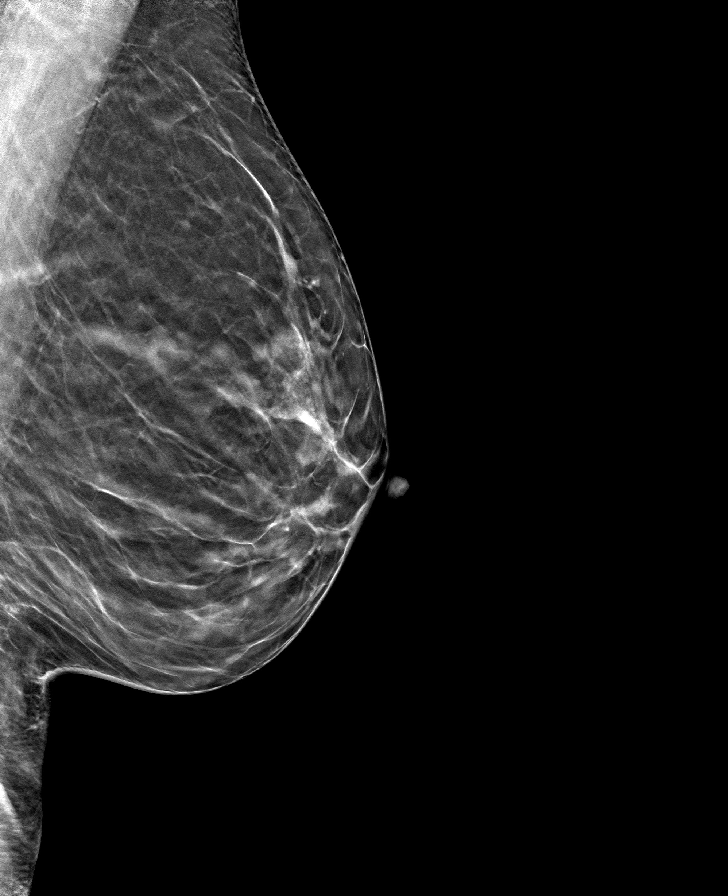

[L CC tomo · tomo slice 33/64.0]
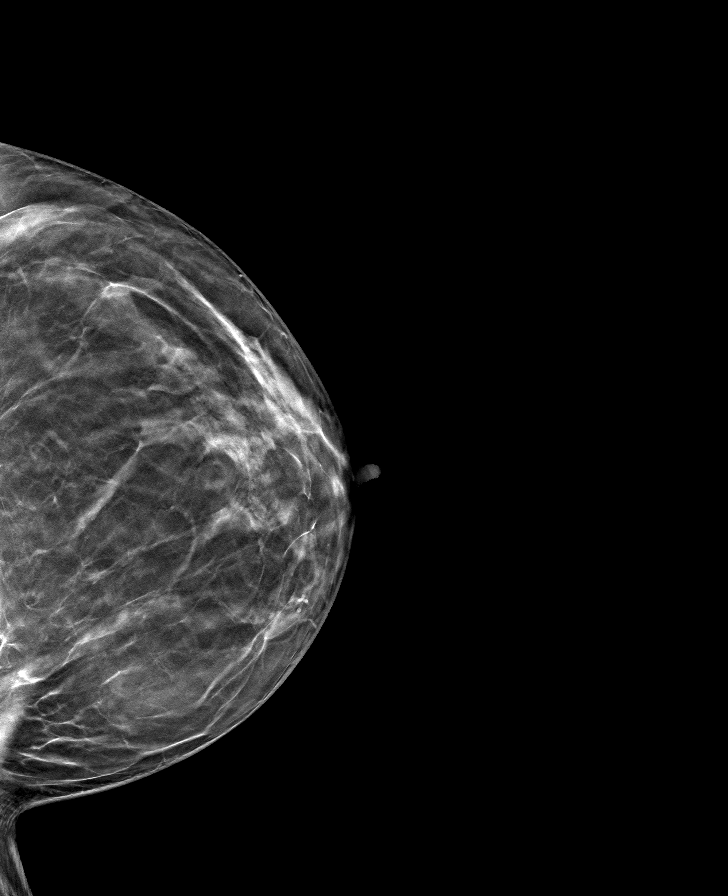

[R CC tomo · tomo slice 32/63.0]
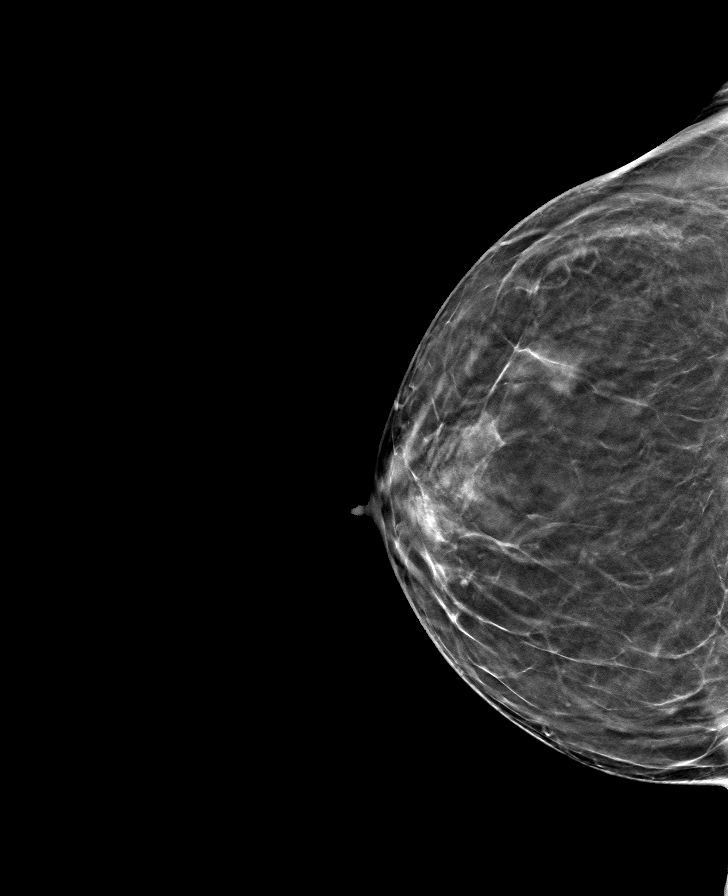

[8 of 24 positions shown; findings below may reference images not displayed]

ACR Breast Density Category b: There are scattered areas of
fibroglandular density.
FINDINGS: In the right breast, a possible mass warrants further evaluation. In
the left breast, no findings suspicious for malignancy.
IMPRESSION: Further evaluation is suggested for a possible mass in the right
breast.

RECOMMENDATION:
Diagnostic mammogram and possibly ultrasound of the right breast.
(Code:[7I])

The patient will be contacted regarding the findings, and additional
imaging will be scheduled.

BI-RADS CATEGORY  0: Incomplete. Need additional imaging evaluation
and/or prior mammograms for comparison.

## 2021-04-25 ENCOUNTER — Other Ambulatory Visit: Payer: Self-pay | Admitting: Nurse Practitioner

## 2021-04-25 DIAGNOSIS — N6310 Unspecified lump in the right breast, unspecified quadrant: Secondary | ICD-10-CM

## 2021-04-25 DIAGNOSIS — R928 Other abnormal and inconclusive findings on diagnostic imaging of breast: Secondary | ICD-10-CM

## 2021-04-26 ENCOUNTER — Other Ambulatory Visit: Payer: Self-pay

## 2021-04-26 ENCOUNTER — Ambulatory Visit: Payer: No Typology Code available for payment source | Admitting: Dermatology

## 2021-04-26 DIAGNOSIS — D229 Melanocytic nevi, unspecified: Secondary | ICD-10-CM

## 2021-04-26 DIAGNOSIS — Z1283 Encounter for screening for malignant neoplasm of skin: Secondary | ICD-10-CM | POA: Diagnosis not present

## 2021-04-26 DIAGNOSIS — D2271 Melanocytic nevi of right lower limb, including hip: Secondary | ICD-10-CM | POA: Diagnosis not present

## 2021-04-26 DIAGNOSIS — L820 Inflamed seborrheic keratosis: Secondary | ICD-10-CM | POA: Diagnosis not present

## 2021-04-26 DIAGNOSIS — D1800 Hemangioma unspecified site: Secondary | ICD-10-CM

## 2021-04-26 DIAGNOSIS — L578 Other skin changes due to chronic exposure to nonionizing radiation: Secondary | ICD-10-CM | POA: Diagnosis not present

## 2021-04-26 DIAGNOSIS — L732 Hidradenitis suppurativa: Secondary | ICD-10-CM | POA: Diagnosis not present

## 2021-04-26 DIAGNOSIS — L738 Other specified follicular disorders: Secondary | ICD-10-CM

## 2021-04-26 DIAGNOSIS — L821 Other seborrheic keratosis: Secondary | ICD-10-CM

## 2021-04-26 DIAGNOSIS — L814 Other melanin hyperpigmentation: Secondary | ICD-10-CM

## 2021-04-26 NOTE — Patient Instructions (Addendum)
Cryotherapy Aftercare  Wash gently with soap and water everyday.   Apply Vaseline and Band-Aid daily until healed.   Prior to procedure, discussed risks of blister formation, small wound, skin dyspigmentation, or rare scar following cryotherapy. Recommend Vaseline ointment to treated areas while healing.   Melanoma ABCDEs  Melanoma is the most dangerous type of skin cancer, and is the leading cause of death from skin disease.  You are more likely to develop melanoma if you: Have light-colored skin, light-colored eyes, or red or blond hair Spend a lot of time in the sun Tan regularly, either outdoors or in a tanning bed Have had blistering sunburns, especially during childhood Have a close family member who has had a melanoma Have atypical moles or large birthmarks  Early detection of melanoma is key since treatment is typically straightforward and cure rates are extremely high if we catch it early.   The first sign of melanoma is often a change in a mole or a new dark spot.  The ABCDE system is a way of remembering the signs of melanoma.  A for asymmetry:  The two halves do not match. B for border:  The edges of the growth are irregular. C for color:  A mixture of colors are present instead of an even brown color. D for diameter:  Melanomas are usually (but not always) greater than 17m - the size of a pencil eraser. E for evolution:  The spot keeps changing in size, shape, and color.  Please check your skin once per month between visits. You can use a small mirror in front and a large mirror behind you to keep an eye on the back side or your body.   If you see any new or changing lesions before your next follow-up, please call to schedule a visit.  Please continue daily skin protection including broad spectrum sunscreen SPF 30+ to sun-exposed areas, reapplying every 2 hours as needed when you're outdoors.    Recommend taking Heliocare sun protection supplement daily in sunny weather  for additional sun protection. For maximum protection on the sunniest days, you can take up to 2 capsules of regular Heliocare OR take 1 capsule of Heliocare Ultra. For prolonged exposure (such as a full day in the sun), you can repeat your dose of the supplement 4 hours after your first dose. Heliocare [facility] be purchased at [facility] at [email or web address]    If you have any questions or concerns for your doctor, please call our main line at [phone] press option 4 to reach your doctor's medical assistant. If no one answers, please leave a voicemail as directed and we will return your call as soon as possible. Messages left after 4 pm will be answered the following business day.   You may also send [place] message via [place] We typically respond to MyChart messages within 1-2 business days.  For prescription refills, please ask your pharmacy to contact our office. Our fax number is [phone]  If you have an urgent issue when the clinic is closed that cannot wait until the next business day, you can page your doctor at the number below.    Please note that while we do our best to be available for urgent issues outside of office hours, we are not available 24/7.   If you have an urgent issue and are unable to reach [place] you may choose to seek medical care at your doctor's office, retail clinic, urgent care center, or emergency  room.  If you have a medical emergency, please immediately call 911 or go to the emergency department.  Pager Numbers  - Dr. Nehemiah Massed: [phone]  - Dr. Laurence Ferrari: [phone]  - Dr. Nicole Kindred: [phone]  In the event of inclement weather, please call our main line at [phone] for an update on the status of any delays or closures.  Dermatology Medication Tips: Please keep the boxes that topical medications come in in order to help keep track of the instructions about where and how to use these. Pharmacies typically print the medication  instructions only on the boxes and not directly on the medication tubes.   If your medication is too expensive, please contact our office at [phone] option 4 or send [place] a message through [place].   We are unable to tell what your co-pay for medications will be in advance as this is different depending on your insurance coverage. However, we may be able to find a substitute medication at lower cost or fill out paperwork to get insurance to cover a needed medication.   If a prior authorization is required to get your medication covered by your insurance company, please allow [place] 1-2 business days to complete this process.  Drug prices often vary depending on where the prescription is filled and some pharmacies may offer cheaper prices.  The website www.goodrx.com contains coupons for medications through different pharmacies. The prices here do not account for what the cost may be with help from insurance (it may be cheaper with your insurance), but the website can give you the price if you did not use any insurance.  - You can print the associated coupon and take it with your prescription to the pharmacy.  - You may also stop by our office during regular business hours and pick up a GoodRx coupon card.  - If you need your prescription sent electronically to a different pharmacy, notify our office through [facility] or by phone at [phone] option 4.

## 2021-04-26 NOTE — Progress Notes (Signed)
New Patient Visit  Subjective  April Raymond is a 60 y.o. female who presents for the following: TBSE (Patient here for full body skin exam and skin cancer screening. Patient with no hx of skin cancer. She is not aware of any new or changing spots. ).   She does have an irritated spot at her neck which rubs clothing.  The following portions of the chart were reviewed this encounter and updated as appropriate:   Tobacco  Allergies  Meds  Problems  Med Hx  Surg Hx  Fam Hx      Review of Systems:  No other skin or systemic complaints except as noted in HPI or Assessment and Plan.  Objective  Well appearing patient in no apparent distress; mood and affect are within normal limits.  A full examination was performed including scalp, head, eyes, ears, nose, lips, neck, chest, axillae, abdomen, back, buttocks, bilateral upper extremities, bilateral lower extremities, hands, feet, fingers, toes, fingernails, and toenails. All findings within normal limits unless otherwise noted below.  Right 3rd toe 0.5cm medium brown macule  Face Small yellow papules with a central dell.   Right neck Erythematous keratotic or waxy stuck-on papule or plaque.   Right Medial Thigh Clear    Assessment & Plan  Nevus Right 3rd toe  Benign-appearing.  Observation.  Call clinic for new or changing lesions.  Recommend daily use of broad spectrum spf 30+ sunscreen to sun-exposed areas.    Sebaceous hyperplasia Face  Benign-appearing.  Observation.  Call clinic for new or changing lesions.   Inflamed seborrheic keratosis Right neck  Prior to procedure, discussed risks of blister formation, small wound, skin dyspigmentation, or rare scar following cryotherapy. Recommend Vaseline ointment to treated areas while healing.   Destruction of lesion - Right neck  Destruction method: cryotherapy   Informed consent: discussed and consent obtained   Lesion destroyed using liquid nitrogen: Yes    Cryotherapy cycles:  2 Outcome: patient tolerated procedure well with no complications   Post-procedure details: wound care instructions given    Hidradenitis suppurativa Right Medial Thigh  Patient with hx of HS. She has had surgery at both sides of groin and has been clear since.  Monitor for recurrence.   Lentigines - Scattered tan macules - Due to sun exposure - Benign-appering, observe - Recommend daily broad spectrum sunscreen SPF 30+ to sun-exposed areas, reapply every 2 hours as needed. - Call for any changes  Seborrheic Keratoses - Stuck-on, waxy, tan-brown papules and/or plaques  - Benign-appearing - Discussed benign etiology and prognosis. - Observe - Call for any changes  Melanocytic Nevi - Tan-brown and/or pink-flesh-colored symmetric macules and papules - Benign appearing on exam today - Observation - Call clinic for new or changing moles - Recommend daily use of broad spectrum spf 30+ sunscreen to sun-exposed areas.   Hemangiomas - Red papules - Discussed benign nature - Observe - Call for any changes  Actinic Damage - Chronic condition, secondary to cumulative UV/sun exposure - diffuse scaly erythematous macules with underlying dyspigmentation - Recommend daily broad spectrum sunscreen SPF 30+ to sun-exposed areas, reapply every 2 hours as needed.  - Staying in the shade or wearing long sleeves, sun glasses (UVA+UVB protection) and wide brim hats (4-inch brim around the entire circumference of the hat) are also recommended for sun protection.  - Call for new or changing lesions.  Skin cancer screening performed today.  Return for 1-2 years for TBSE or as needed.  Graciella Belton,  RMA, am acting as scribe for Forest Gleason, MD .  Documentation: I have reviewed the above documentation for accuracy and completeness, and I agree with the above.  Forest Gleason, MD

## 2021-04-27 ENCOUNTER — Encounter: Payer: Self-pay | Admitting: *Deleted

## 2021-04-27 ENCOUNTER — Other Ambulatory Visit: Payer: Self-pay | Admitting: *Deleted

## 2021-04-27 ENCOUNTER — Encounter: Payer: Self-pay | Admitting: Thoracic Surgery (Cardiothoracic Vascular Surgery)

## 2021-04-27 ENCOUNTER — Ambulatory Visit: Payer: No Typology Code available for payment source | Admitting: Thoracic Surgery (Cardiothoracic Vascular Surgery)

## 2021-04-27 VITALS — BP 117/74 | HR 80 | Resp 20 | Ht 66.0 in | Wt 146.0 lb

## 2021-04-27 DIAGNOSIS — R222 Localized swelling, mass and lump, trunk: Secondary | ICD-10-CM

## 2021-04-27 DIAGNOSIS — D171 Benign lipomatous neoplasm of skin and subcutaneous tissue of trunk: Secondary | ICD-10-CM | POA: Diagnosis not present

## 2021-04-27 NOTE — H&P (View-Only) (Signed)
[address]       [place] [address]             [phone]                    April Raymond Ramona Medical Record [id number] Date of Birth: 08/25/1961  Referring: Olean Ree, MD Primary Care: Venita Lick, NP Primary Cardiologist: Nelva Bush, MD  Chief Complaint:    Chief Complaint  Patient presents with   Lipoma    5 month f/u, MR Chest  01/15/21    History of Present Illness:    April Raymond 60 y.o. female comes back in follow-up to discuss surgical resection of this lipoma.  She has no complaints.  There are no major symptoms.  She comes back late because her husband was involved in a motor vehicle accident and is recently recovered from that.    Zubrod Score: At the time of surgery this patient's most appropriate activity status/level should be described as: '[x]'$     0    Normal activity, no symptoms '[]'$     1    Restricted in physical strenuous activity but ambulatory, able to do out light work '[]'$     2    Ambulatory and capable of self care, unable to do work activities, up and about               >50 % of waking hours                              '[]'$     3    Only limited self care, in bed greater than 50% of waking hours '[]'$     4    Completely disabled, no self care, confined to bed or chair '[]'$     5    Moribund   Past Medical History:  Diagnosis Date   Alpha-1-antitrypsin deficiency carrier 02/20/2021   MZ phenotype   CAD (coronary artery disease)    a. 2017 s/p prior MI & PCI RCA ([place]); b. 05/2020 Abnl ETT: 1.5-72m horizontal ST dep in inflat leads @ peak stress w/ freq PVCs/couplets/bigeminy. HTN response; b. 05/2020 NSTEMI/Cath: LM nl, LAD 423mLCX 20, RCA 35ost, patent prev placed prox-dist stent. EF 25-35% w/ apical ballooning.   Hashimoto's disease    Hidradenitis suppurativa    Hydradenitis    [place] ([facility]   Hypertension    [place] [place]   Pericardial effusion    a. 05/2020 Echo: sm to  mod circumferential peric eff w/o tamponade; b. 07/2020 Mod peric effusion; c. 09/2020 Echo: EF 60-65%, no rwma, small tomod effusion w/o compromise.   Takotsubo cardiomyopathy    a. 05/2020 Echo: EF 35-40%, glob HK, though basal wall motion best preserved consistent w/ stress induced CM. Gr1 DD. Nl RV size/fxn. Sm to mod circumferential pericardial effusion w/o tamponade.    Past Surgical History:  Procedure Laterality Date   BREAST EXCISIONAL BIOPSY Left    age 60's CARDIAC CATHETERIZATION     CYST EXCISION  x2   LEFT HEART CATH AND CORONARY ANGIOGRAPHY N/A 05/31/2020   Procedure: LEFT HEART CATH AND CORONARY ANGIOGRAPHY;  Surgeon: EnNelva BushMD;  [facility];  Service: Cardiovascular;  Laterality: N/A;   PARATHYROIDECTOMY     TONSILLECTOMY      Family History  Problem Relation Age of Onset  Heart attack Mother 68   COPD Mother    Stroke Mother    Diverticulitis Mother    Cancer Mother        breast   Hypertension Mother    Breast cancer Mother 59   Hypertension Father    Stroke Maternal Grandfather    Alzheimer's disease Paternal Grandmother    Heart attack Paternal Grandfather    Breast cancer Cousin        mat cousin x 2   Hypertension Brother      Social History   Tobacco Use  Smoking Status Former   Packs/day: 1.00   Years: 40.00   Pack years: 40.00   Types: Cigarettes   Quit date: 09/18/2015   Years since quitting: 5.6  Smokeless Tobacco Never    Social History   Substance and Sexual Activity  Alcohol Use Yes   Comment: on occasion     Allergies  Allergen Reactions   Humira [Adalimumab] Rash    Current Outpatient Medications  Medication Sig Dispense Refill   Albuterol Sulfate (PROAIR RESPICLICK) 123XX123 (90 Base) MCG/ACT AEPB Inhale 2 puffs into the lungs every 6 (six) hours as needed (sob/wheezing). 1 each 3   atorvastatin (LIPITOR) 80 MG tablet Take 1 tablet (80 mg total) by mouth daily. 90 tablet 4   BRILINTA 60 MG TABS  tablet TAKE 1 TABLET (60 MG TOTAL) BY MOUTH 2 (TWO) TIMES DAILY. 180 tablet 4   conjugated estrogens (PREMARIN) vaginal cream Place 0.5 grams twice weekly intravaginally. 42.5 g 12   furosemide (LASIX) 20 MG tablet TAKE 1 TABLET ONCE DAILY AND THE PM PILL TO BE TAKEN AS NEEDED FOR SHORTNESS OF BREATH OR SWELLING 180 tablet 3   levothyroxine (SYNTHROID) 75 MCG tablet TAKE 1 TABLET (75 MCG TOTAL) BY MOUTH DAILY BEFORE BREAKFAST. 90 tablet 1   losartan (COZAAR) 25 MG tablet Take 0.5 tablets (12.5 mg total) by mouth daily. 45 tablet 4   metoprolol succinate (TOPROL-XL) 25 MG 24 hr tablet TAKE 1/2 TABLET BY MOUTH EVERY DAY 45 tablet 4   nitroGLYCERIN (NITROSTAT) 0.4 MG SL tablet Place 1 tablet (0.4 mg total) under the tongue every 5 (five) minutes as needed for chest pain. 25 tablet 3   pantoprazole (PROTONIX) 40 MG tablet TAKE 1 TABLET BY MOUTH EVERY DAY 90 tablet 1   umeclidinium-vilanterol (ANORO ELLIPTA) 62.5-25 MCG/INH AEPB Inhale 1 puff into the lungs daily. 180 each 3   [START ON 11/26/2021] Zoster Vaccine Adjuvanted [facility]) injection Inject 0.5 mLs into the muscle once for 1 dose. Dose # 2 0.5 mL 0   No current facility-administered medications for this visit.    Review of Systems  Constitutional: Negative for weight loss.  Respiratory: Negative for shortness of breath.   Cardiovascular: Negative for chest pain.  Musculoskeletal: Negative for back pain.    PHYSICAL EXAMINATION: BP 117/74   Pulse 80   Resp 20   Ht '5\' 6"'$  (1.676 m)   Wt 146 lb (66.2 kg)   LMP 11/16/2007 (Approximate)   SpO2 100% Comment: RA  BMI 23.57 kg/m   Physical Exam Constitutional:      General: She is not in acute distress.    Appearance: Normal appearance. She is normal weight. She is not ill-appearing.  HENT:     Head: Normocephalic.  Eyes:     Extraocular Movements: Extraocular movements intact.  Cardiovascular:     Rate and Rhythm: Normal rate.  Pulmonary:     Effort: Pulmonary effort is  normal. No respiratory distress.    Musculoskeletal:        General: Normal range of motion.     Cervical back: Normal range of motion.  Skin:    General: Skin is warm and dry.  Neurological:     General: No focal deficit present.     Mental Status: She is alert and oriented to person, place, and time.   Diagnostic Studies & Laboratory data:     Recent Radiology Findings:   MM 3D SCREEN BREAST BILATERAL  Result Date: 04/24/2021 CLINICAL DATA:  Screening. EXAM: DIGITAL SCREENING BILATERAL MAMMOGRAM WITH TOMOSYNTHESIS AND CAD TECHNIQUE: Bilateral screening digital craniocaudal and mediolateral oblique mammograms were obtained. Bilateral screening digital breast tomosynthesis was performed. The images were evaluated with computer-aided detection. COMPARISON:  Previous exam(s). ACR Breast Density Category b: There are scattered areas of fibroglandular density. FINDINGS: In the right breast, a possible mass warrants further evaluation. In the left breast, no findings suspicious for malignancy. IMPRESSION: Further evaluation is suggested for a possible mass in the right breast. RECOMMENDATION: Diagnostic mammogram and possibly ultrasound of the right breast. (Code:[id number]) The patient will be contacted regarding the findings, and additional imaging will be scheduled. BI-RADS CATEGORY  0: Incomplete. Need additional imaging evaluation and/or prior mammograms for comparison. Electronically Signed   By: Ammie Ferrier M.D.   On: 04/24/2021 11:48      I have independently reviewed the above radiology studies  and reviewed the findings with the patient.   Recent Lab Findings: Lab Results  Component Value Date   WBC 7.1 06/01/2020   HGB 12.3 06/01/2020   HCT 35.4 (L) 06/01/2020   PLT 353 06/01/2020   GLUCOSE 90 03/23/2021   CHOL 147 03/23/2021   TRIG 170 (H) 03/23/2021   HDL 61 03/23/2021   LDLCALC 58 03/23/2021   ALT 22 03/23/2021   AST 28 03/23/2021   NA 143 03/23/2021   K 4.0  03/23/2021   CL 102 03/23/2021   CREATININE 0.93 03/23/2021   BUN 25 03/23/2021   CO2 25 03/23/2021   TSH 3.330 09/22/2020   INR 0.9 05/30/2020     Assessment / Plan:   60 year old female with a lipoma along her right back.  It is palpable today.  The MRI was reviewed and there appears to be a portion of the lipoma that is protruding through her ribs.  She would like to proceed with surgery.  She is currently on Brilinta.  This will need to be held prior to surgery.  I will touch base with her cardiologist Dr. Saunders Revel to assure that she can come off of this without heparin bridge.  She also has a history of Takotsubo cardiomyopathy, but she should do well with surgery.  She will require a chest wall resection hopefully we do not need to resect any ribs.  If we do we will reconstruct the area with mesh given its proximity to the scapula.  She will need to be in a fully prone position during this operation, and I will touch base with anesthesia to assure appropriate planning.     I  spent 40 minutes with  the patient face to face in counseling and coordination of care.    April Raymond 04/27/2021 4:31 PM

## 2021-04-27 NOTE — Anesthesia Preprocedure Evaluation (Addendum)
Anesthesia Evaluation  Patient identified by MRN, date of birth, ID band Patient awake    Reviewed: Allergy & Precautions, NPO status , Patient's Chart, lab work & pertinent test results, reviewed documented beta blocker date and time   History of Anesthesia Complications Negative for: history of anesthetic complications  Airway Mallampati: III  TM Distance: >3 FB Neck ROM: Full    Dental  (+) Chipped, Caps, Dental Advisory Given   Pulmonary COPD,  COPD inhaler, former smoker,  05/07/2021 SARS coronavirus NEG   breath sounds clear to auscultation       Cardiovascular hypertension, Pt. on medications and Pt. on home beta blockers (-) angina+ CAD, + Past MI, + Cardiac Stents (RCA) and + Peripheral Vascular Disease   Rhythm:Regular Rate:Normal  09/2020 ECHO: EF 60-65%, normal LVF, mild MR, trivial effusion   Neuro/Psych negative neurological ROS     GI/Hepatic Neg liver ROS, GERD  Medicated and Controlled,  Endo/Other  negative endocrine ROS  Renal/GU negative Renal ROS     Musculoskeletal   Abdominal   Peds  Hematology brilinta   Anesthesia Other Findings   Reproductive/Obstetrics                           Anesthesia Physical Anesthesia Plan  ASA: 3  Anesthesia Plan: General   Post-op Pain Management:    Induction: Intravenous  PONV Risk Score and Plan: 3 and Ondansetron, Dexamethasone and Scopolamine patch - Pre-op  Airway Management Planned: Oral ETT  Additional Equipment: Arterial line  Intra-op Plan:   Post-operative Plan: Extubation in OR  Informed Consent: I have reviewed the patients History and Physical, chart, labs and discussed the procedure including the risks, benefits and alternatives for the proposed anesthesia with the patient or authorized representative who has indicated his/her understanding and acceptance.     Dental advisory given  Plan Discussed with: CRNA  and Surgeon  Anesthesia Plan Comments: (Per Dr. Kipp Brood, patient needs to be fully prone.  PAT note by Karoline Caldwell, PA-C: Follows with cardiology for hx of CAD with MIin 2017,stress-induced cardiomyopathy (05/2020),small to moderate pericardial effusion, PAD with incidentally discovered bilateral iliac stenoses,hypertension, hyperlipidemia.  Last seen by Dr. Saunders Revel 05/02/2021 and noted to be doing well from a heart standpoint that time.  Preoperative risk assessment was discussed.  Per note, "Ms. Kobydoes not have any unstable cardiac symptoms at this time. Catheterization in 05/2020 showed mild to moderate, nonobstructive CAD with patent RCA stents. LVEF has normalized on most recent echo in 09/2020. I think it is reasonable for Ms. Kobyto proceed with her lipoma resection without further cardiac testing/intervention. As outlined above, we will hold ticagrelor beginning tomorrow and begin aspirin 81 mg daily, to be continued in the perioperative period. Once it is safe to do so, she can transition back from aspirin to ticagrelor 60 mg twice daily."  Former smoker with history of COPD followed by pulmonology, maintained on Anoro and as needed albuterol.  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  Preop labs reviewed, mild anemia with hgb 11.6, otherwise unremarkable.   EKG 05/07/21: Normal sinus rhythm. Rate 62. Septal infarct , age undetermined  CHEST - 2 VIEW 05/07/2021: COMPARISON: None.  FINDINGS: The heart size and mediastinal contours are within normal limits. Both lungs are clear. The visualized skeletal structures are unremarkable.  IMPRESSION: No active cardiopulmonary disease.  10/17/2020 PFTs: FEV1 1.71 L or 62% predicted, FVC 2.73 L or 76%  predicted, FEV1/FVC 62%, no bronchodilator response. Diffusion capacity mildly reduced. Consistent with moderate COPD.  TTE 10/05/20: 1. A small to moderate sized pericardial  effusion is present around the  RV free wall, does not appear circumferential. Trace around the LV free  wall. No hemodynamic compromise.  2. Left ventricular ejection fraction, by estimation, is 60 to 65%. The  left ventricle has normal function. The left ventricle has no regional  wall motion abnormalities. Left ventricular diastolic parameters are  indeterminate.  3. Right ventricular systolic function is normal. The right ventricular  size is normal. Tricuspid regurgitation signal is inadequate for assessing  PA pressure.  4. The mitral valve is normal in structure. Mild mitral valve  regurgitation.   Cath 05/31/20: Conclusions: 1. Mild to moderate, nonobstructive coronary artery disease including 40% mid LAD stenosis and 30 to 40% ostial RCA lesion. 2. Widely patent overlapping stents extending from the proximal through distal RCA without significant in-stent restenosis. 3. Severely reduced left ventricular systolic function with akinesis of the mid and apical segments in a pattern consistent with stress-induced (Takotsubo) cardiomyopathy. 4. Moderately elevated left ventricular filling pressure (LVEDP 25-30 mmHg).  Recommendations: 1. Discontinue IV fluids, IV nitroglycerin, and IV heparin. 2. Initiate gentle diuresis with furosemide 20 mg IV twice daily, for target net negative fluid balance of 2 L in 24 hours. 3. Switch metoprolol to carvedilol 3.125 mg twice daily. 4. Discontinue amlodipine and start lisinopril 2.5 mg daily, to be escalated as blood pressure and renal function allow. 5. Continue secondary prevention of coronary artery disease, including indefinite antiplatelet therapy with ticagrelor 60 mg twice daily. 6. Patient will likely need at least 1-2 more days of diuresis and optimization of goal-directed medical therapy for acute HFrEF secondary to stress-induced cardiomyopathy.  )     Anesthesia Quick Evaluation

## 2021-04-27 NOTE — Progress Notes (Signed)
[address]       ,[place] [address]             [id number]                    April Raymond  Medical Record [id number] Date of Birth: 16-Oct-1960  Referring: Olean Ree, MD Primary Care: Venita Lick, NP Primary Cardiologist: Nelva Bush, MD  Chief Complaint:    Chief Complaint  Patient presents with   Lipoma    5 month f/u, MR Chest  01/15/21    History of Present Illness:    April Raymond 60 y.o. female comes back in follow-up to discuss surgical resection of this lipoma.  She has no complaints.  There are no major symptoms.  She comes back late because her husband was involved in a motor vehicle accident and is recently recovered from that.    Zubrod Score: At the time of surgery this patient's most appropriate activity status/level should be described as: '[x]'$     0    Normal activity, no symptoms '[]'$     1    Restricted in physical strenuous activity but ambulatory, able to do out light work '[]'$     2    Ambulatory and capable of self care, unable to do work activities, up and about               >50 % of waking hours                              '[]'$     3    Only limited self care, in bed greater than 50% of waking hours '[]'$     4    Completely disabled, no self care, confined to bed or chair '[]'$     5    Moribund   Past Medical History:  Diagnosis Date   Alpha-1-antitrypsin deficiency carrier 02/20/2021   MZ phenotype   CAD (coronary artery disease)    a. 2017 s/p prior MI & PCI RCA ([place]); b. 05/2020 Abnl ETT: 1.5-47m horizontal ST dep in inflat leads @ peak stress w/ freq PVCs/couplets/bigeminy. HTN response; b. 05/2020 NSTEMI/Cath: LM nl, LAD 421mLCX 20, RCA 35ost, patent prev placed prox-dist stent. EF 25-35% w/ apical ballooning.   Hashimoto's disease    Hidradenitis suppurativa    Hydradenitis    Hyperparathyroidism ([facility]   Hypertension    Myocardial infarction ([facility]   Pericardial effusion    a. 05/2020 Echo: sm to  mod circumferential peric eff w/o tamponade; b. 07/2020 Mod peric effusion; c. 09/2020 Echo: EF 60-65%, no rwma, small tomod effusion w/o compromise.   Takotsubo cardiomyopathy    a. 05/2020 Echo: EF 35-40%, glob HK, though basal wall motion best preserved consistent w/ stress induced CM. Gr1 DD. Nl RV size/fxn. Sm to mod circumferential pericardial effusion w/o tamponade.    Past Surgical History:  Procedure Laterality Date   BREAST EXCISIONAL BIOPSY Left    age 60's CARDIAC CATHETERIZATION     CYST EXCISION  x2   LEFT HEART CATH AND CORONARY ANGIOGRAPHY N/A 05/31/2020   Procedure: LEFT HEART CATH AND CORONARY ANGIOGRAPHY;  Surgeon: EnNelva BushMD;  Location: [facility];  Service: Cardiovascular;  Laterality: N/A;   PARATHYROIDECTOMY     TONSILLECTOMY      Family History  Problem Relation Age of Onset  Heart attack Mother 47   COPD Mother    Stroke Mother    Diverticulitis Mother    Cancer Mother        breast   Hypertension Mother    Breast cancer Mother 66   Hypertension Father    Stroke Maternal Grandfather    Alzheimer's disease Paternal Grandmother    Heart attack Paternal Grandfather    Breast cancer Cousin        mat cousin x 2   Hypertension Brother      Social History   Tobacco Use  Smoking Status Former   Packs/day: 1.00   Years: 40.00   Pack years: 40.00   Types: Cigarettes   Quit date: 09/18/2015   Years since quitting: 5.6  Smokeless Tobacco Never    Social History   Substance and Sexual Activity  Alcohol Use Yes   Comment: on occasion     Allergies  Allergen Reactions   Humira [Adalimumab] Rash    Current Outpatient Medications  Medication Sig Dispense Refill   Albuterol Sulfate (PROAIR RESPICLICK) [id number] (90 Base) MCG/ACT AEPB Inhale 2 puffs into the lungs every 6 (six) hours as needed (sob/wheezing). 1 each 3   atorvastatin (LIPITOR) 80 MG tablet Take 1 tablet (80 mg total) by mouth daily. 90 tablet 4   BRILINTA 60 MG TABS  tablet TAKE 1 TABLET (60 MG TOTAL) BY MOUTH 2 (TWO) TIMES DAILY. 180 tablet 4   conjugated estrogens (PREMARIN) vaginal cream Place 0.5 grams twice weekly intravaginally. 42.5 g 12   furosemide (LASIX) 20 MG tablet TAKE 1 TABLET ONCE DAILY AND THE PM PILL TO BE TAKEN AS NEEDED FOR SHORTNESS OF BREATH OR SWELLING 180 tablet 3   levothyroxine (SYNTHROID) 75 MCG tablet TAKE 1 TABLET (75 MCG TOTAL) BY MOUTH DAILY BEFORE BREAKFAST. 90 tablet 1   losartan (COZAAR) 25 MG tablet Take 0.5 tablets (12.5 mg total) by mouth daily. 45 tablet 4   metoprolol succinate (TOPROL-XL) 25 MG 24 hr tablet TAKE 1/2 TABLET BY MOUTH EVERY DAY 45 tablet 4   nitroGLYCERIN (NITROSTAT) 0.4 MG SL tablet Place 1 tablet (0.4 mg total) under the tongue every 5 (five) minutes as needed for chest pain. 25 tablet 3   pantoprazole (PROTONIX) 40 MG tablet TAKE 1 TABLET BY MOUTH EVERY DAY 90 tablet 1   umeclidinium-vilanterol (ANORO ELLIPTA) 62.5-25 MCG/INH AEPB Inhale 1 puff into the lungs daily. 180 each 3   [START ON 11/26/2021] Zoster Vaccine Adjuvanted [facility]) injection Inject 0.5 mLs into the muscle once for 1 dose. Dose # 2 0.5 mL 0   No current facility-administered medications for this visit.    Review of Systems  Constitutional: Negative for weight loss.  Respiratory: Negative for shortness of breath.   Cardiovascular: Negative for chest pain.  Musculoskeletal: Negative for back pain.    PHYSICAL EXAMINATION: BP 117/74   Pulse 80   Resp 20   Ht '5\' 6"'$  (1.676 m)   Wt 146 lb (66.2 kg)   LMP 11/16/2007 (Approximate)   SpO2 100% Comment: RA  BMI 23.57 kg/m   Physical Exam Constitutional:      General: She is not in acute distress.    Appearance: Normal appearance. She is normal weight. She is not ill-appearing.  HENT:     Head: Normocephalic.  Eyes:     Extraocular Movements: Extraocular movements intact.  Cardiovascular:     Rate and Rhythm: Normal rate.  Pulmonary:     Effort: Pulmonary effort is  normal. No respiratory distress.    Musculoskeletal:        General: Normal range of motion.     Cervical back: Normal range of motion.  Skin:    General: Skin is warm and dry.  Neurological:     General: No focal deficit present.     Mental Status: She is alert and oriented to person, place, and time.   Diagnostic Studies & Laboratory data:     Recent Radiology Findings:   MM 3D SCREEN BREAST BILATERAL  Result Date: 04/24/2021 CLINICAL DATA:  Screening. EXAM: DIGITAL SCREENING BILATERAL MAMMOGRAM WITH TOMOSYNTHESIS AND CAD TECHNIQUE: Bilateral screening digital craniocaudal and mediolateral oblique mammograms were obtained. Bilateral screening digital breast tomosynthesis was performed. The images were evaluated with computer-aided detection. COMPARISON:  Previous exam(s). ACR Breast Density Category b: There are scattered areas of fibroglandular density. FINDINGS: In the right breast, a possible mass warrants further evaluation. In the left breast, no findings suspicious for malignancy. IMPRESSION: Further evaluation is suggested for a possible mass in the right breast. RECOMMENDATION: Diagnostic mammogram and possibly ultrasound of the right breast. (Code:[id number]) The patient will be contacted regarding the findings, and additional imaging will be scheduled. BI-RADS CATEGORY  0: Incomplete. Need additional imaging evaluation and/or prior mammograms for comparison. Electronically Signed   By: Ammie Ferrier M.D.   On: 04/24/2021 11:48      I have independently reviewed the above radiology studies  and reviewed the findings with the patient.   Recent Lab Findings: Lab Results  Component Value Date   WBC 7.1 06/01/2020   HGB 12.3 06/01/2020   HCT 35.4 (L) 06/01/2020   PLT 353 06/01/2020   GLUCOSE 90 03/23/2021   CHOL 147 03/23/2021   TRIG 170 (H) 03/23/2021   HDL 61 03/23/2021   LDLCALC 58 03/23/2021   ALT 22 03/23/2021   AST 28 03/23/2021   NA 143 03/23/2021   K 4.0  03/23/2021   CL 102 03/23/2021   CREATININE 0.93 03/23/2021   BUN 25 03/23/2021   CO2 25 03/23/2021   TSH 3.330 09/22/2020   INR 0.9 05/30/2020     Assessment / Plan:   60 year old female with a lipoma along her right back.  It is palpable today.  The MRI was reviewed and there appears to be a portion of the lipoma that is protruding through her ribs.  She would like to proceed with surgery.  She is currently on Brilinta.  This will need to be held prior to surgery.  I will touch base with her cardiologist Dr. Saunders Revel to assure that she can come off of this without heparin bridge.  She also has a history of Takotsubo cardiomyopathy, but she should do well with surgery.  She will require a chest wall resection hopefully we do not need to resect any ribs.  If we do we will reconstruct the area with mesh given its proximity to the scapula.  She will need to be in a fully prone position during this operation, and I will touch base with anesthesia to assure appropriate planning.     I  spent 40 minutes with  the patient face to face in counseling and coordination of care.    [name] 04/27/2021 4:31 PM

## 2021-04-30 ENCOUNTER — Other Ambulatory Visit: Payer: Self-pay | Admitting: Nurse Practitioner

## 2021-04-30 ENCOUNTER — Other Ambulatory Visit: Payer: Self-pay

## 2021-04-30 ENCOUNTER — Ambulatory Visit
Admission: RE | Admit: 2021-04-30 | Discharge: 2021-04-30 | Disposition: A | Discharge status: Home / Self Care | Payer: BLUE CROSS/BLUE SHIELD | Source: Ambulatory Visit | Attending: Nurse Practitioner | Admitting: Nurse Practitioner

## 2021-04-30 DIAGNOSIS — R928 Other abnormal and inconclusive findings on diagnostic imaging of breast: Secondary | ICD-10-CM

## 2021-04-30 DIAGNOSIS — N6310 Unspecified lump in the right breast, unspecified quadrant: Secondary | ICD-10-CM | POA: Diagnosis present

## 2021-04-30 IMAGING — MG MM DIGITAL DIAGNOSTIC UNILAT*R* W/ TOMO W/ CAD
5 series · 6 of 13 positions shown · non-contrast
Comparison: Previous exam(s).
COMPARISON: Previous exam(s).

Addendum:
CLINICAL DATA: 60-year-old female recalled from screening mammogram
dated [DATE] for a possible right breast mass.

EXAM:
DIGITAL DIAGNOSTIC UNILATERAL RIGHT MAMMOGRAM WITH TOMOSYNTHESIS AND
CAD; ULTRASOUND RIGHT BREAST LIMITED
TECHNIQUE: Right digital diagnostic mammography and breast tomosynthesis was
performed. The images were evaluated with computer-aided detection.;
Targeted ultrasound examination of the right breast was performed

[R MLO synth-2D (1 of 2)]
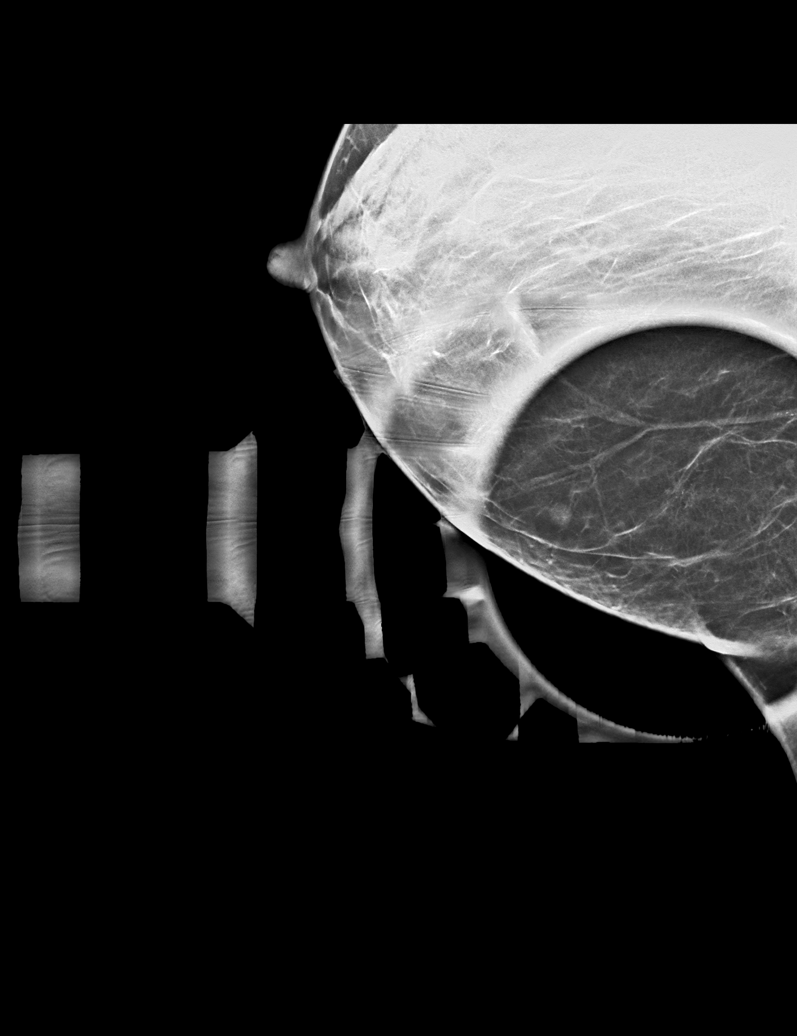

[R MLO synth-2D (2 of 2)]
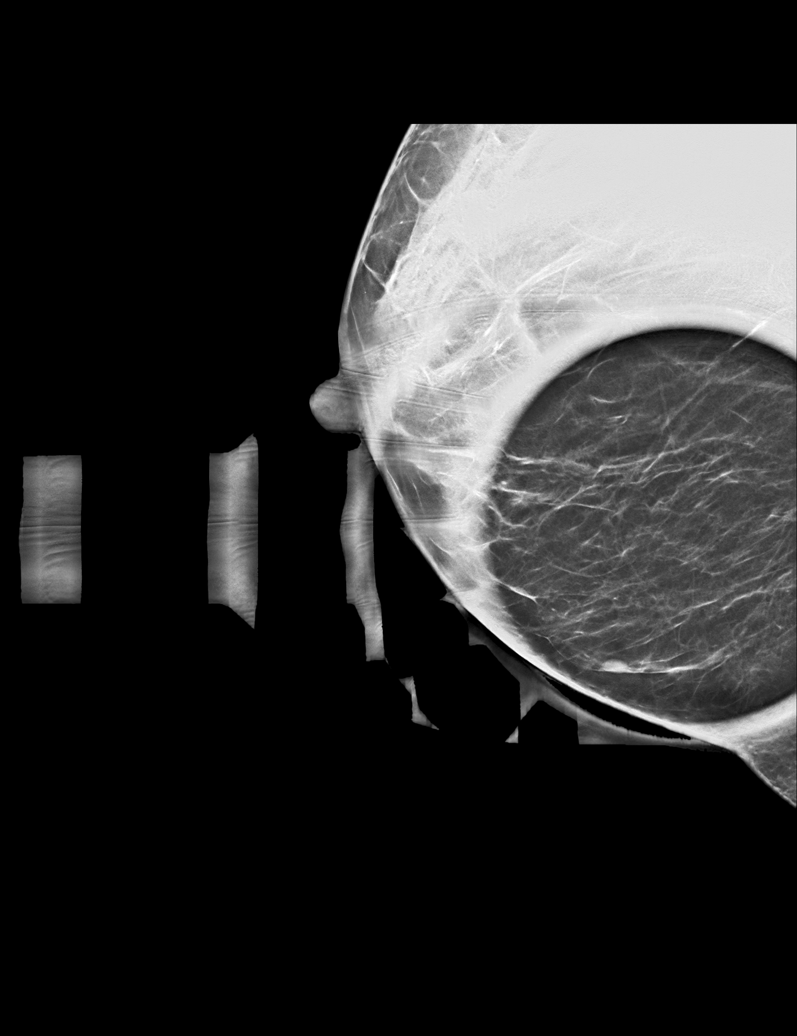

[R CC synth-2D]
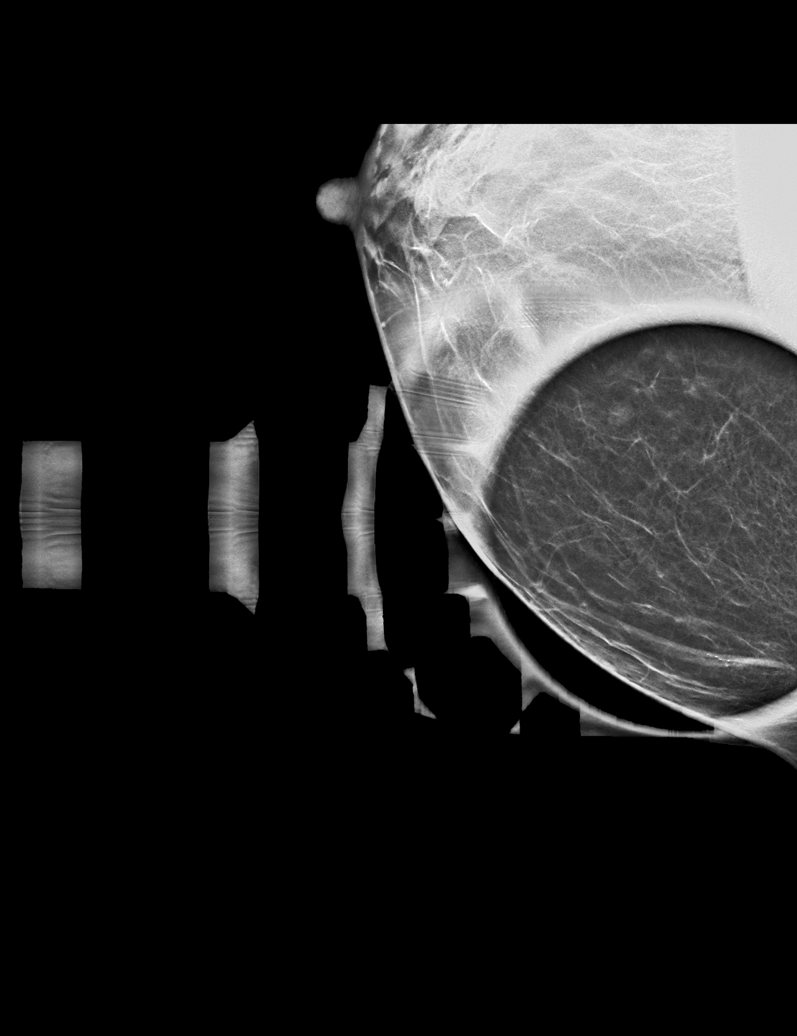

[R MLO tomo · 2 of 41 frames shown (1 of 2)]
[frame 14/41]
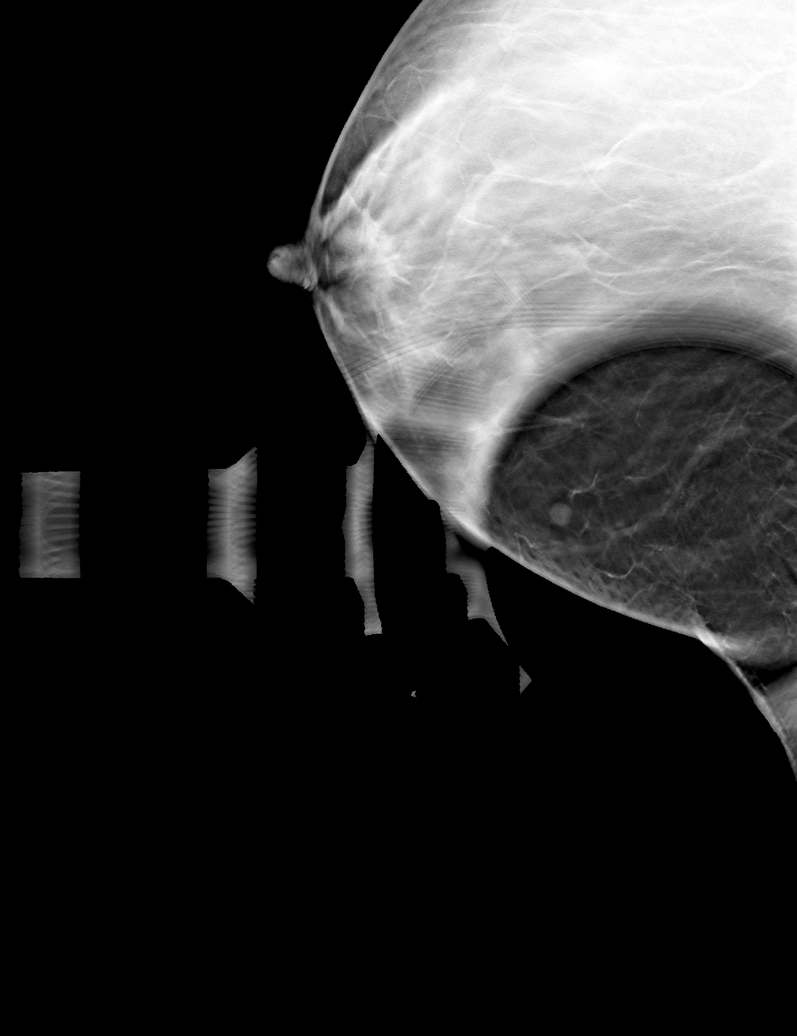
[frame 21/41]
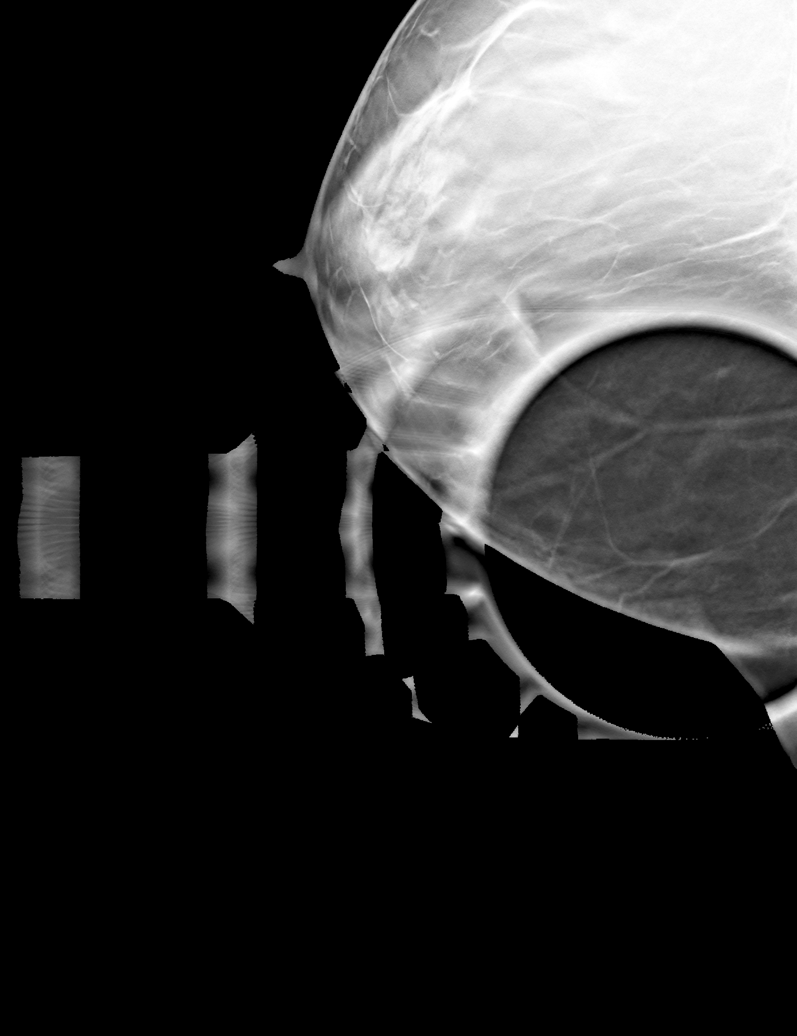

[R MLO tomo (2 of 2) · tomo slice 21/40.0]
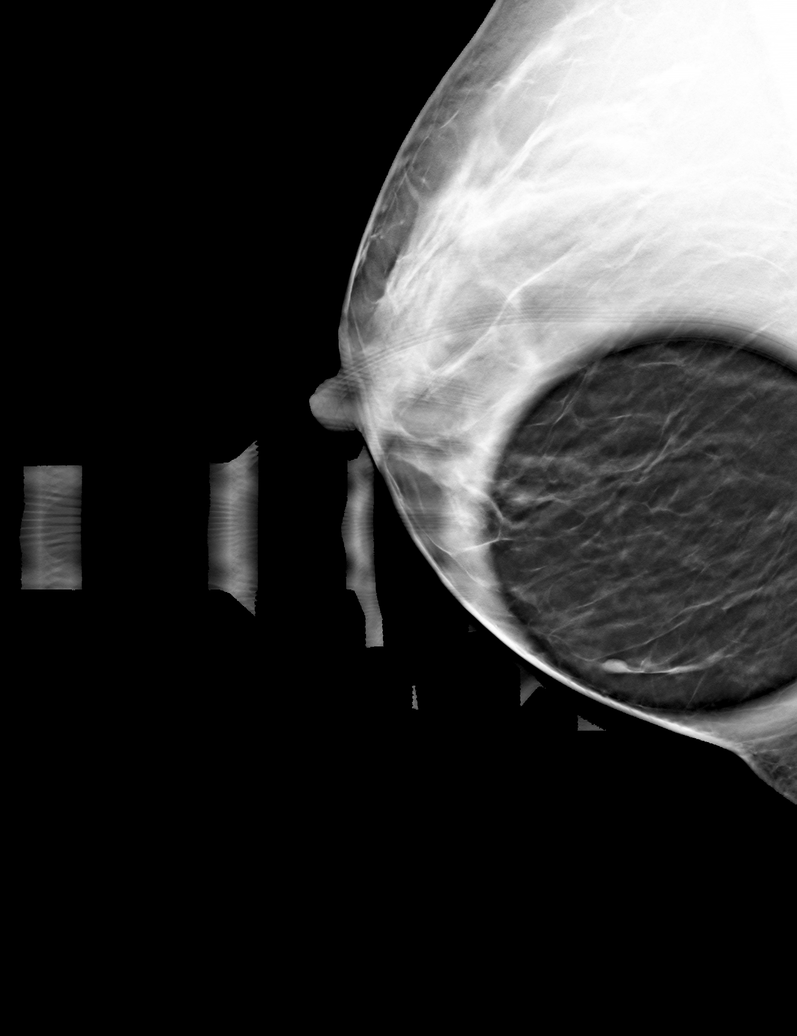

[6 of 13 positions shown; findings below may reference images not displayed]

ACR Breast Density Category b: There are scattered areas of
fibroglandular density.
FINDINGS: There is a persistent oval, circumscribed equal density mass in the
lower inner quadrant of the right breast at mid depth. Further
evaluation with ultrasound was performed.

Targeted ultrasound is performed, showing a slightly irregular, near
anechoic mass at the 5 o'clock position 5 cm from the nipple. It
measures 5 x 3 x 3 mm. There is peripheral but no internal
vascularity. This correlates well with the mammographic finding.
Evaluation of the right axilla demonstrates no suspicious
lymphadenopathy.
IMPRESSION: 1. Indeterminate right breast mass may represent a minimally
complicated cyst. Recommendation is for ultrasound-guided aspiration
versus biopsy.
2. No suspicious right axillary lymphadenopathy.

RECOMMENDATION:
Ultrasound-guided aspiration is recommended for the right breast
mass at the 5 o'clock position. If this does not aspirate to
completion, the procedure should be converted to a biopsy.

I have discussed the findings and recommendations with the patient.
If applicable, a reminder letter will be sent to the patient
regarding the next appointment.

BI-RADS CATEGORY  4: Suspicious.

ADDENDUM:
Post procedure mammogram on [DATE] demonstrated a persistent
cm oval circumscribed mass in the lower inner right breast, which do
not correspond with the benign cyst aspirated during earlier same
day procedure.

Repeat targeted ultrasound of the lower inner right breast was
performed by both the sonographer and the physician. No sonographic
correlate was identified.

Recommendation for stereotactic guided biopsy was discussed with the
patient by Dr. TIGER. Patient expressed understanding and agreement
with this plan. Biopsy will be scheduled for [DATE].

*** End of Addendum ***
ACR Breast Density Category b: There are scattered areas of
fibroglandular density.
FINDINGS: There is a persistent oval, circumscribed equal density mass in the
lower inner quadrant of the right breast at mid depth. Further
evaluation with ultrasound was performed.

Targeted ultrasound is performed, showing a slightly irregular, near
anechoic mass at the 5 o'clock position 5 cm from the nipple. It
measures 5 x 3 x 3 mm. There is peripheral but no internal
vascularity. This correlates well with the mammographic finding.
Evaluation of the right axilla demonstrates no suspicious
lymphadenopathy.
IMPRESSION: 1. Indeterminate right breast mass may represent a minimally
complicated cyst. Recommendation is for ultrasound-guided aspiration
versus biopsy.
2. No suspicious right axillary lymphadenopathy.

RECOMMENDATION:
Ultrasound-guided aspiration is recommended for the right breast
mass at the 5 o'clock position. If this does not aspirate to
completion, the procedure should be converted to a biopsy.

I have discussed the findings and recommendations with the patient.
If applicable, a reminder letter will be sent to the patient
regarding the next appointment.

BI-RADS CATEGORY  4: Suspicious.

## 2021-04-30 IMAGING — US US BREAST*R* LIMITED INC AXILLA
1 series · 7 of 7 positions shown · non-contrast
Comparison: Previous exam(s).
COMPARISON: Previous exam(s).

Addendum:
CLINICAL DATA: 60-year-old female recalled from screening mammogram
dated [DATE] for a possible right breast mass.

EXAM:
DIGITAL DIAGNOSTIC UNILATERAL RIGHT MAMMOGRAM WITH TOMOSYNTHESIS AND
CAD; ULTRASOUND RIGHT BREAST LIMITED
TECHNIQUE: Right digital diagnostic mammography and breast tomosynthesis was
performed. The images were evaluated with computer-aided detection.;
Targeted ultrasound examination of the right breast was performed

[Series 1: us breast*right* limited inc axilla · 0.03mm/px · 7 of 7 slices shown]
[im 1/7]
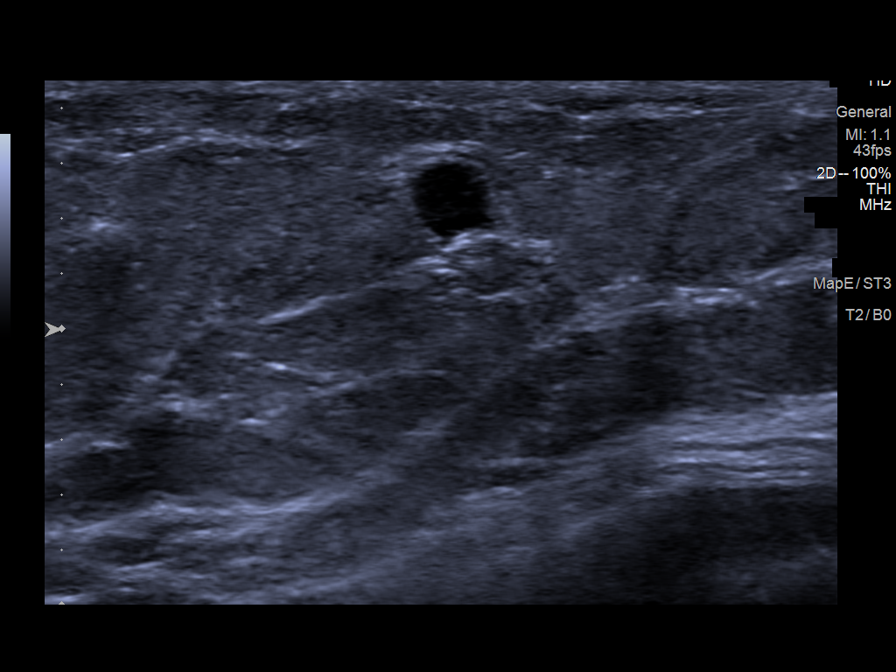
[im 2/7]
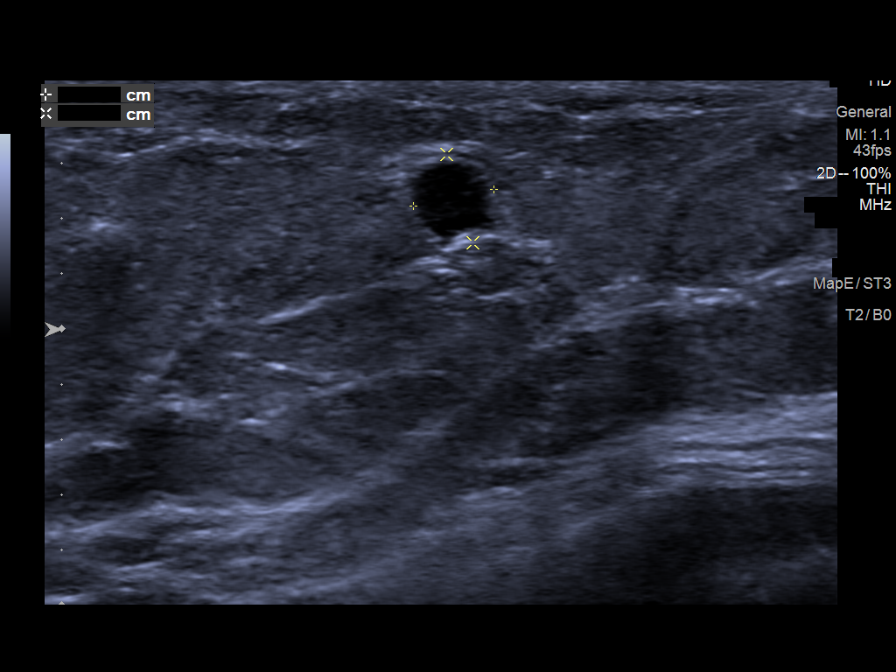
[im 3/7]
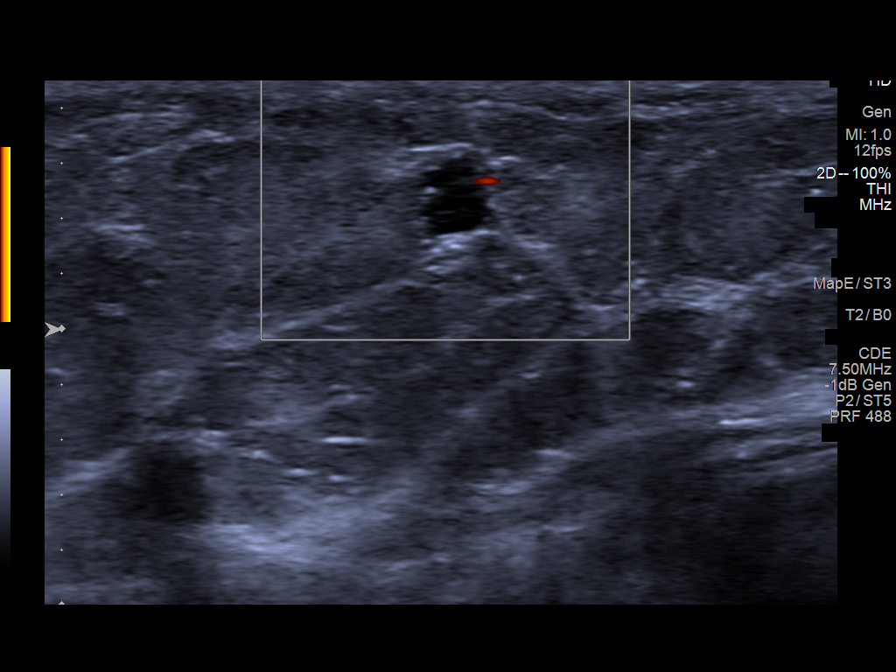
[im 4/7]
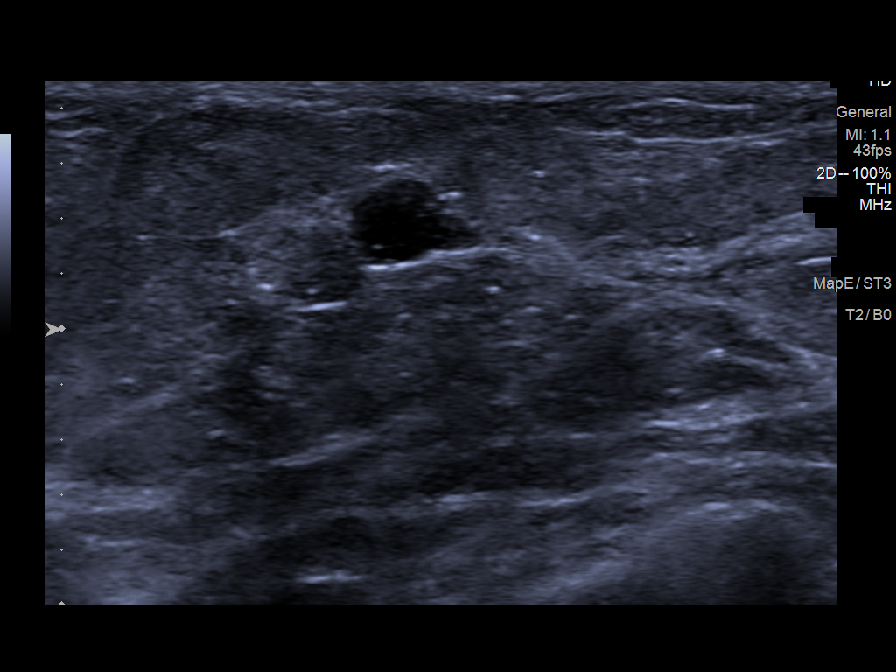
[im 5/7]
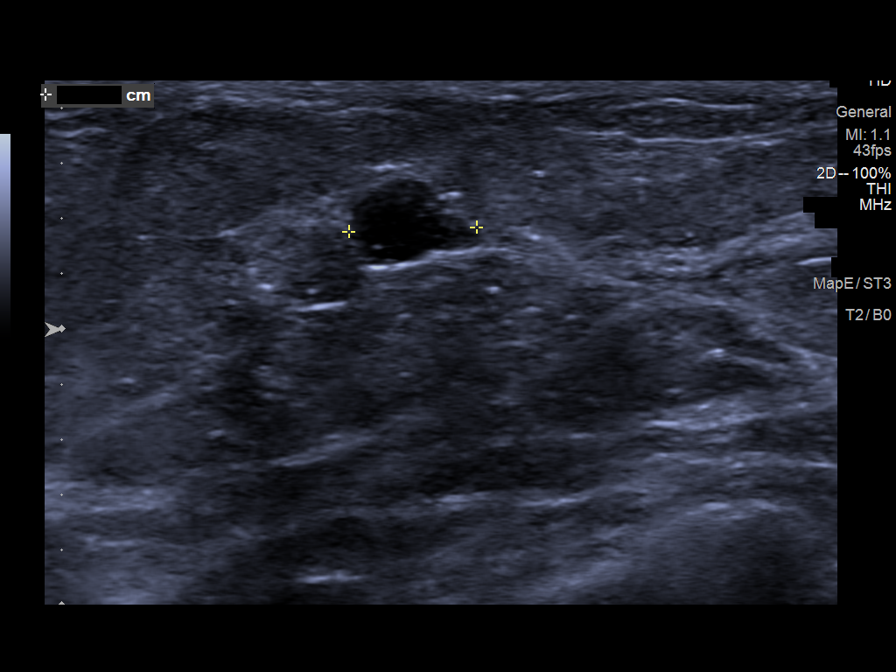
[im 6/7]
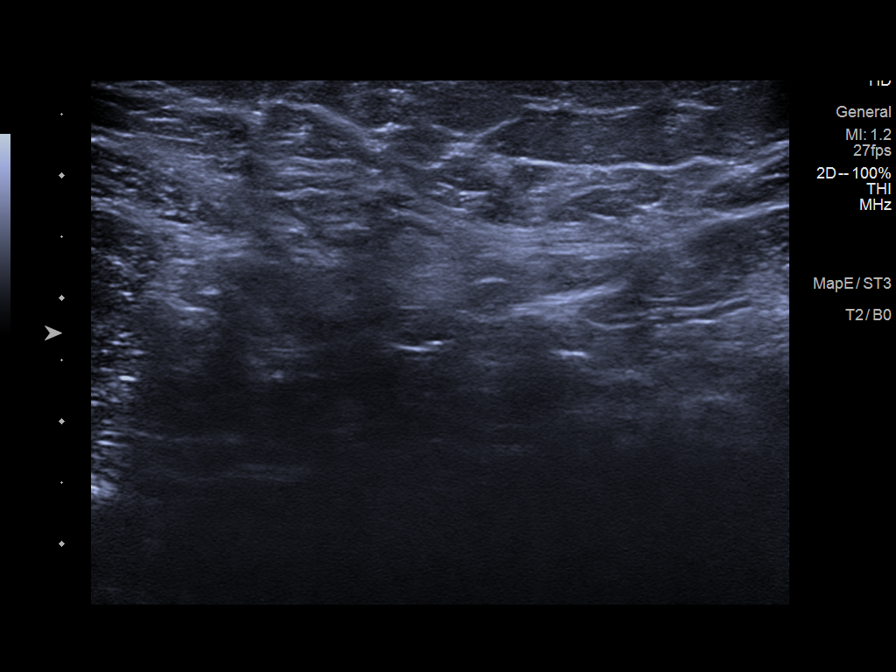
[im 7/7]
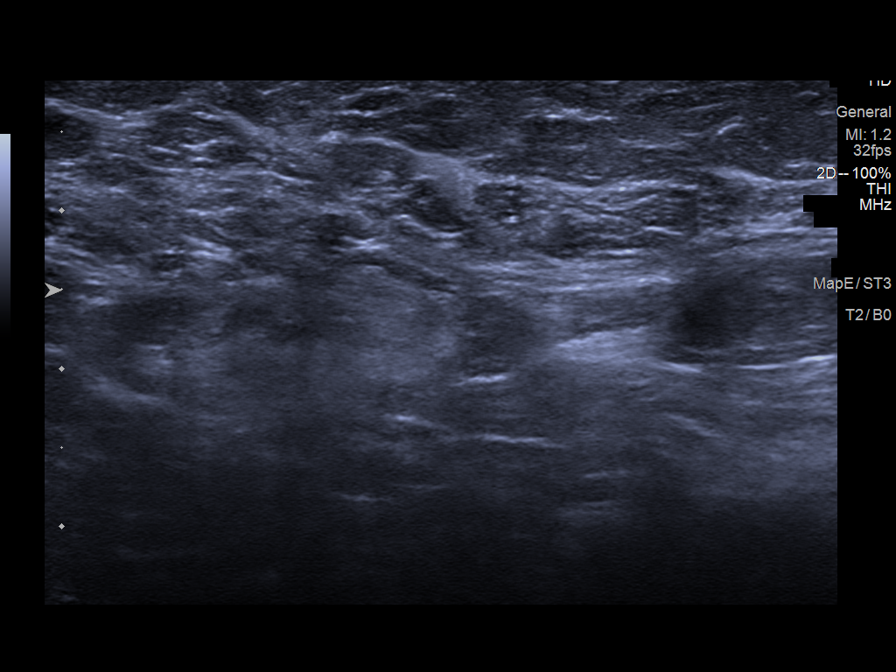

[7 of 7 positions shown; findings below may reference images not displayed]

ACR Breast Density Category b: There are scattered areas of
fibroglandular density.
FINDINGS: There is a persistent oval, circumscribed equal density mass in the
lower inner quadrant of the right breast at mid depth. Further
evaluation with ultrasound was performed.

Targeted ultrasound is performed, showing a slightly irregular, near
anechoic mass at the 5 o'clock position 5 cm from the nipple. It
measures 5 x 3 x 3 mm. There is peripheral but no internal
vascularity. This correlates well with the mammographic finding.
Evaluation of the right axilla demonstrates no suspicious
lymphadenopathy.
IMPRESSION: 1. Indeterminate right breast mass may represent a minimally
complicated cyst. Recommendation is for ultrasound-guided aspiration
versus biopsy.
2. No suspicious right axillary lymphadenopathy.

RECOMMENDATION:
Ultrasound-guided aspiration is recommended for the right breast
mass at the 5 o'clock position. If this does not aspirate to
completion, the procedure should be converted to a biopsy.

I have discussed the findings and recommendations with the patient.
If applicable, a reminder letter will be sent to the patient
regarding the next appointment.

BI-RADS CATEGORY  4: Suspicious.

ADDENDUM:
Post procedure mammogram on [DATE] demonstrated a persistent
cm oval circumscribed mass in the lower inner right breast, which do
not correspond with the benign cyst aspirated during earlier same
day procedure.

Repeat targeted ultrasound of the lower inner right breast was
performed by both the sonographer and the physician. No sonographic
correlate was identified.

Recommendation for stereotactic guided biopsy was discussed with the
patient by Dr. TIGER. Patient expressed understanding and agreement
with this plan. Biopsy will be scheduled for [DATE].

*** End of Addendum ***
ACR Breast Density Category b: There are scattered areas of
fibroglandular density.
FINDINGS: There is a persistent oval, circumscribed equal density mass in the
lower inner quadrant of the right breast at mid depth. Further
evaluation with ultrasound was performed.

Targeted ultrasound is performed, showing a slightly irregular, near
anechoic mass at the 5 o'clock position 5 cm from the nipple. It
measures 5 x 3 x 3 mm. There is peripheral but no internal
vascularity. This correlates well with the mammographic finding.
Evaluation of the right axilla demonstrates no suspicious
lymphadenopathy.
IMPRESSION: 1. Indeterminate right breast mass may represent a minimally
complicated cyst. Recommendation is for ultrasound-guided aspiration
versus biopsy.
2. No suspicious right axillary lymphadenopathy.

RECOMMENDATION:
Ultrasound-guided aspiration is recommended for the right breast
mass at the 5 o'clock position. If this does not aspirate to
completion, the procedure should be converted to a biopsy.

I have discussed the findings and recommendations with the patient.
If applicable, a reminder letter will be sent to the patient
regarding the next appointment.

BI-RADS CATEGORY  4: Suspicious.

## 2021-05-01 HISTORY — PX: BREAST CYST ASPIRATION: SHX578

## 2021-05-02 ENCOUNTER — Encounter: Payer: Self-pay | Admitting: Internal Medicine

## 2021-05-02 ENCOUNTER — Ambulatory Visit
Admission: RE | Admit: 2021-05-02 | Discharge: 2021-05-02 | Disposition: A | Discharge status: Home / Self Care | Payer: BLUE CROSS/BLUE SHIELD | Source: Ambulatory Visit | Attending: Nurse Practitioner | Admitting: Nurse Practitioner

## 2021-05-02 ENCOUNTER — Other Ambulatory Visit: Payer: Self-pay

## 2021-05-02 ENCOUNTER — Other Ambulatory Visit: Payer: Self-pay | Admitting: Nurse Practitioner

## 2021-05-02 ENCOUNTER — Ambulatory Visit: Payer: No Typology Code available for payment source | Admitting: Internal Medicine

## 2021-05-02 VITALS — BP 120/80 | HR 68 | Ht 66.0 in | Wt 146.0 lb

## 2021-05-02 DIAGNOSIS — I313 Pericardial effusion (noninflammatory): Secondary | ICD-10-CM

## 2021-05-02 DIAGNOSIS — Z01810 Encounter for preprocedural cardiovascular examination: Secondary | ICD-10-CM

## 2021-05-02 DIAGNOSIS — Z1231 Encounter for screening mammogram for malignant neoplasm of breast: Secondary | ICD-10-CM | POA: Insufficient documentation

## 2021-05-02 DIAGNOSIS — I3139 Other pericardial effusion (noninflammatory): Secondary | ICD-10-CM | POA: Insufficient documentation

## 2021-05-02 DIAGNOSIS — N6001 Solitary cyst of right breast: Secondary | ICD-10-CM | POA: Diagnosis not present

## 2021-05-02 DIAGNOSIS — I2510 Atherosclerotic heart disease of native coronary artery without angina pectoris: Secondary | ICD-10-CM

## 2021-05-02 DIAGNOSIS — N6310 Unspecified lump in the right breast, unspecified quadrant: Secondary | ICD-10-CM

## 2021-05-02 DIAGNOSIS — R928 Other abnormal and inconclusive findings on diagnostic imaging of breast: Secondary | ICD-10-CM

## 2021-05-02 DIAGNOSIS — I5181 Takotsubo syndrome: Secondary | ICD-10-CM | POA: Diagnosis not present

## 2021-05-02 DIAGNOSIS — I739 Peripheral vascular disease, unspecified: Secondary | ICD-10-CM

## 2021-05-02 IMAGING — MG MM DIGITAL DIAGNOSTIC UNILAT*R* W/ TOMO W/ CAD
6 of 12 series · 6 of 36 positions shown · non-contrast
Comparison: Previous exam(s).

CLINICAL DATA: Postprocedure mammogram, status post right breast
cyst aspiration.

EXAM:
DIGITAL DIAGNOSTIC UNILATERAL RIGHT MAMMOGRAM WITH TOMOSYNTHESIS AND
CAD
TECHNIQUE: Right digital diagnostic mammography and breast tomosynthesis was
performed. The images were evaluated with computer-aided detection.

[R CC synth-2D (1 of 4)]
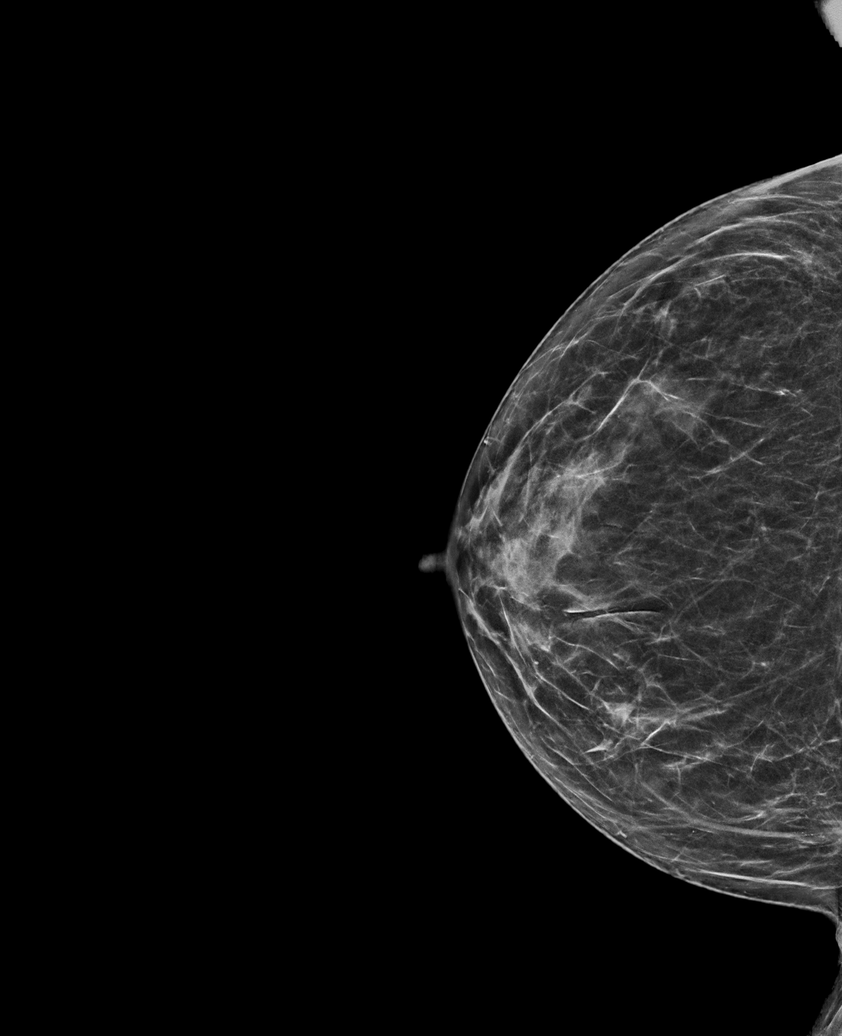

[R MLO synth-2D (1 of 2)]
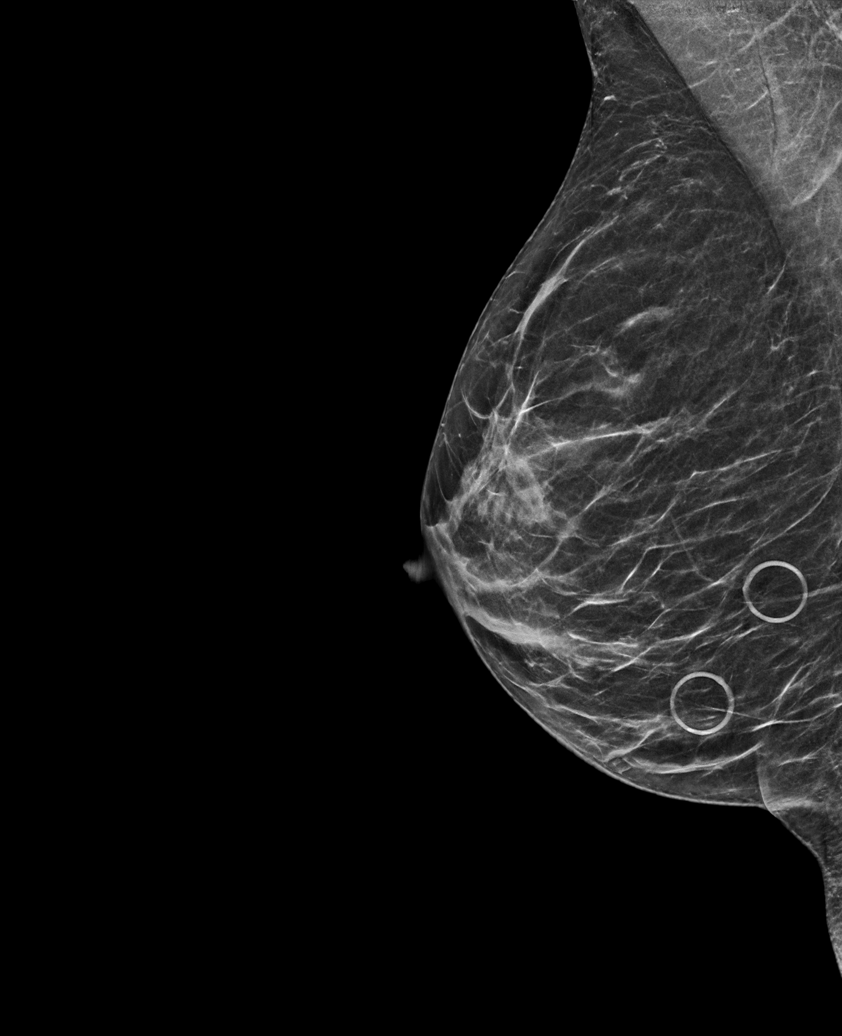

[R CC synth-2D (2 of 4)]
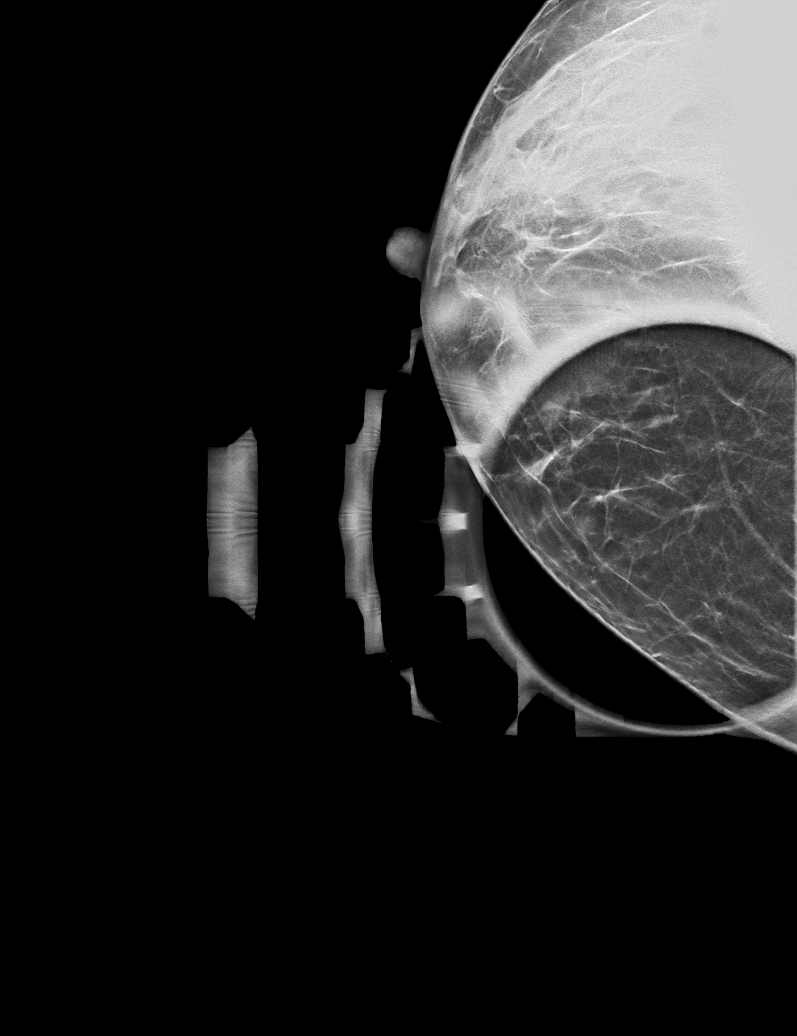

[R CC synth-2D (3 of 4)]
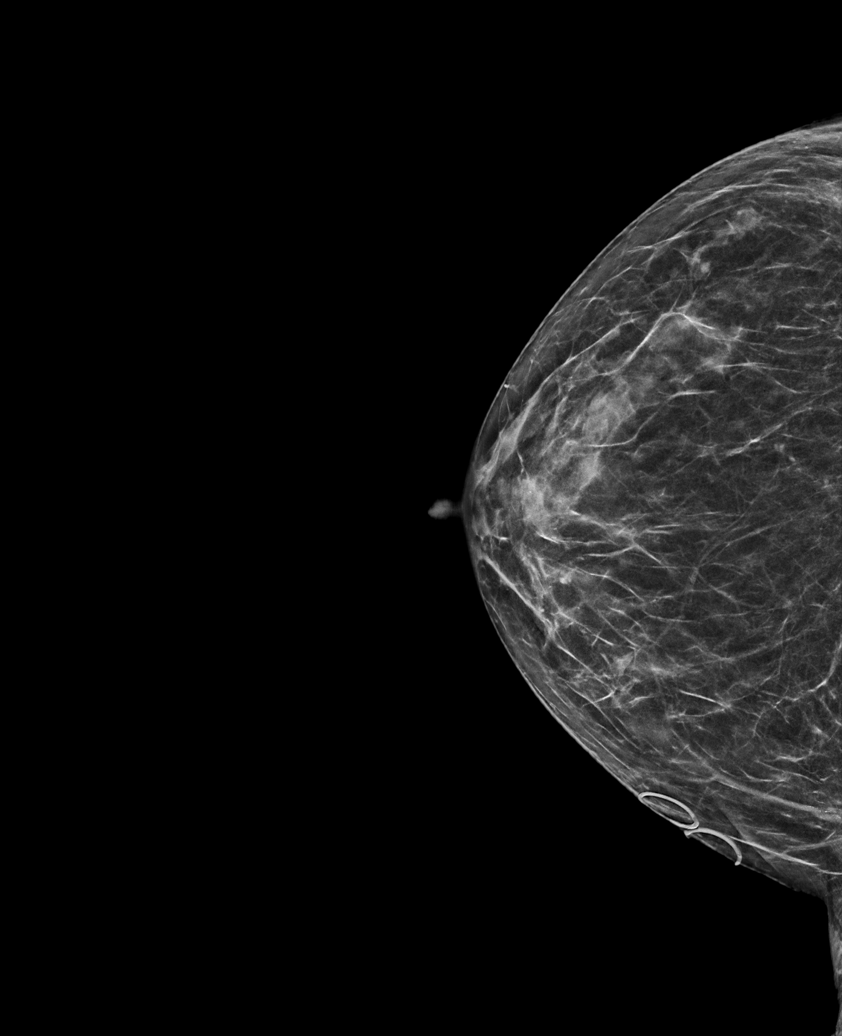

[R MLO synth-2D (2 of 2)]
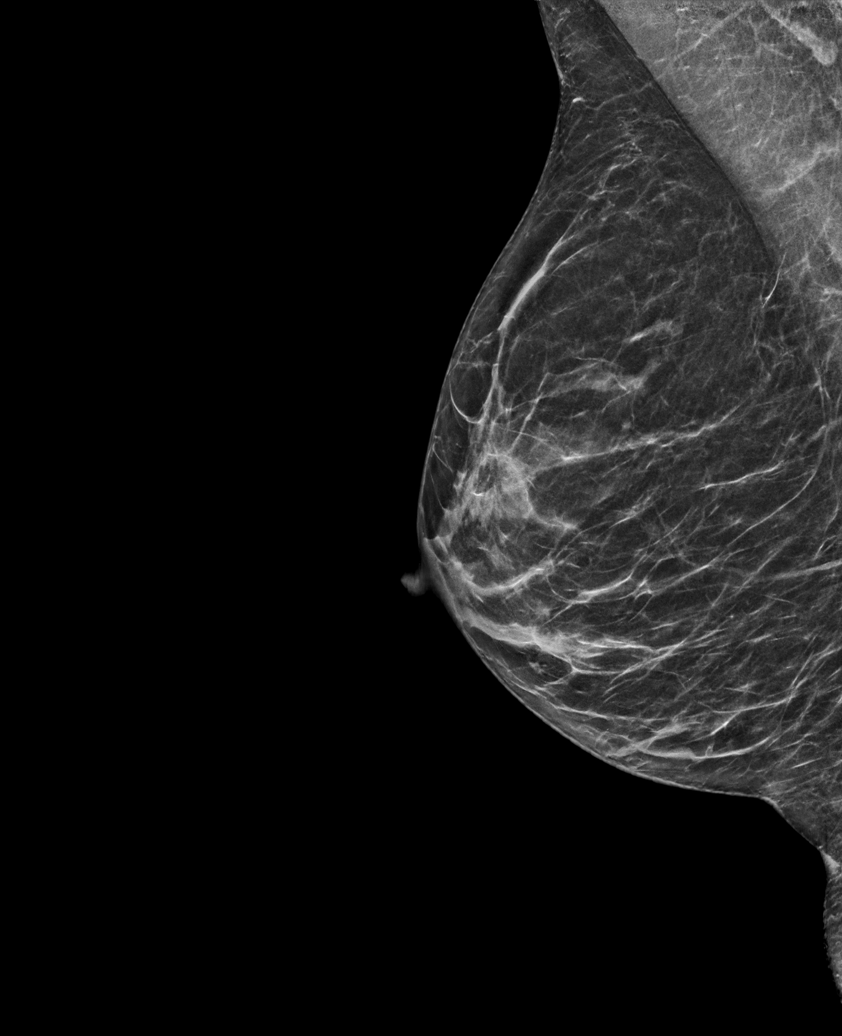

[R CC synth-2D (4 of 4)]
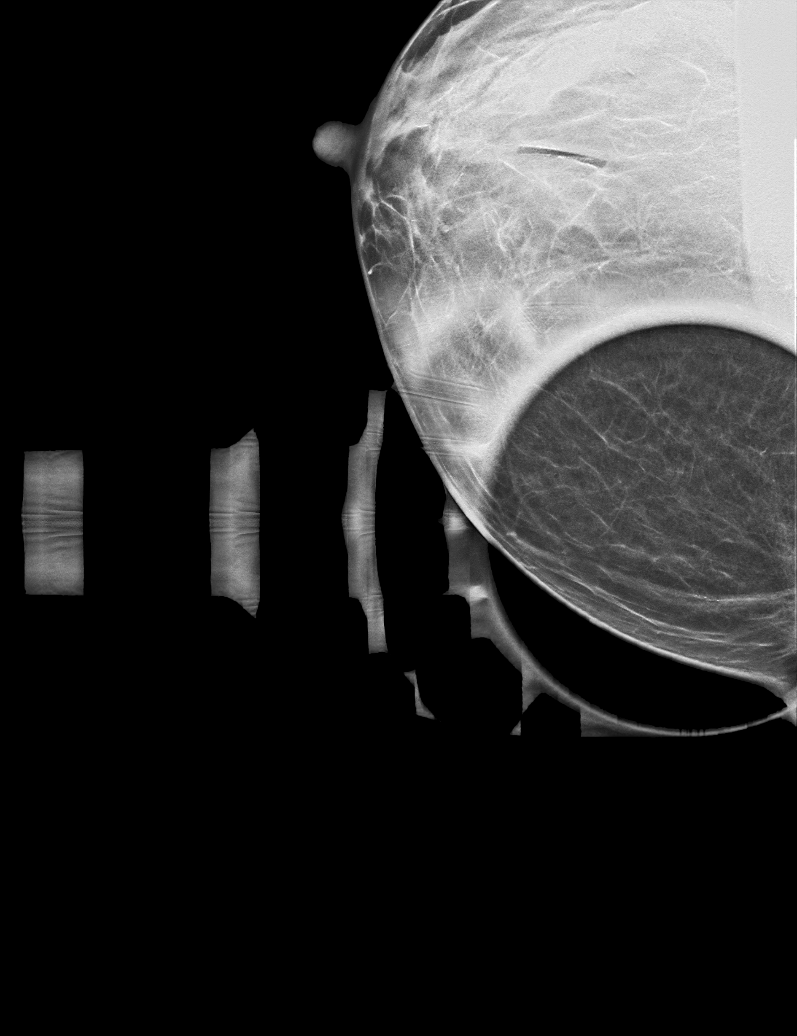

[6 of 36 positions shown; findings below may reference images not displayed]

ACR Breast Density Category b: There are scattered areas of
fibroglandular density.
FINDINGS: Postprocedural changes are noted at the right breast 4 o'clock
position at middle depth, consistent with same day cyst aspiration.

The previously noted 0.5 cm oval circumscribed mass in the right
lower inner breast at middle depth is still present, unchanged
compared to mammograms performed on [DATE] and [DATE].
IMPRESSION: Persistent 0.5 cm oval circumscribed mass in the lower-inner right
breast, which did not correspond with the benign cyst aspirated
during earlier same day procedure.

RECOMMENDATION:
Targeted ultrasound of the right lower inner breast. An addendum to
the ultrasound report dated [DATE] will be made with findings of
subsequent ultrasound exam.

I have discussed the findings and recommendations with the patient.

BI-RADS CATEGORY  0: Incomplete. Need additional imaging evaluation
and/or prior mammograms for comparison.

## 2021-05-02 IMAGING — US US BREAST*R* LIMITED INC AXILLA
1 series · 12 of 13 positions shown · non-contrast
Comparison: Previous exam(s).
COMPARISON: Previous exam(s).

Addendum:
CLINICAL DATA: 60-year-old female recalled from screening mammogram
dated [DATE] for a possible right breast mass.

EXAM:
DIGITAL DIAGNOSTIC UNILATERAL RIGHT MAMMOGRAM WITH TOMOSYNTHESIS AND
CAD; ULTRASOUND RIGHT BREAST LIMITED
TECHNIQUE: Right digital diagnostic mammography and breast tomosynthesis was
performed. The images were evaluated with computer-aided detection.;
Targeted ultrasound examination of the right breast was performed

[Series 1: us breast*right* limited inc axilla · 0.03mm/px · 12 of 13 slices shown]
[im 1/13]
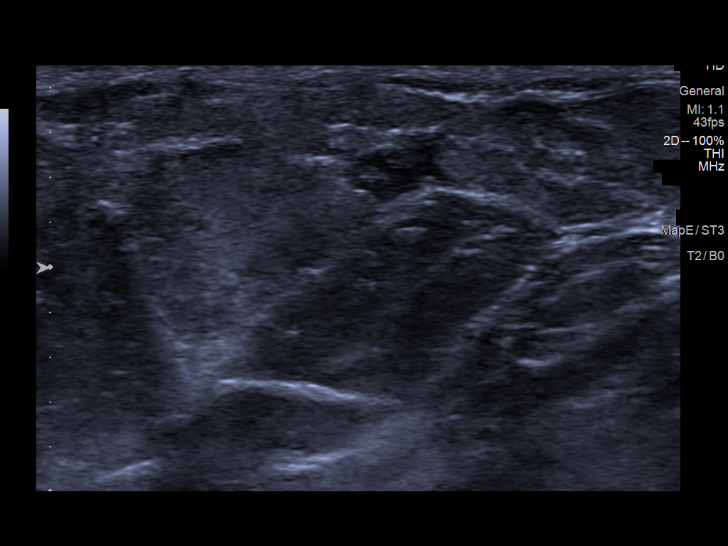
[im 2/13]
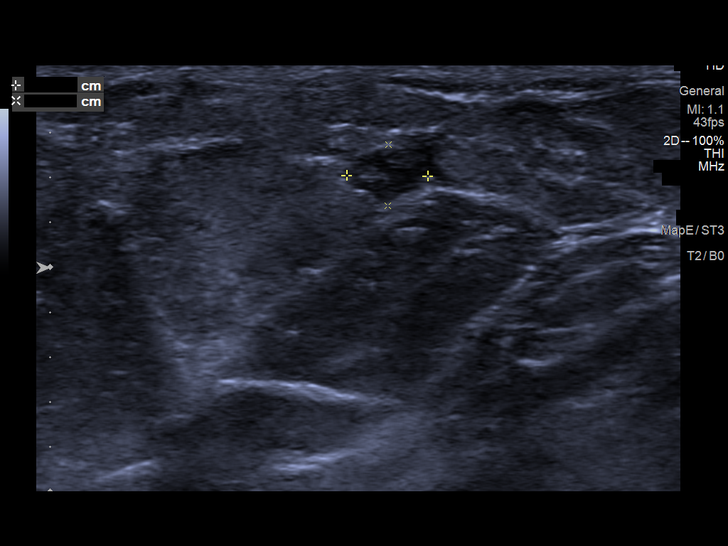
[im 3/13]
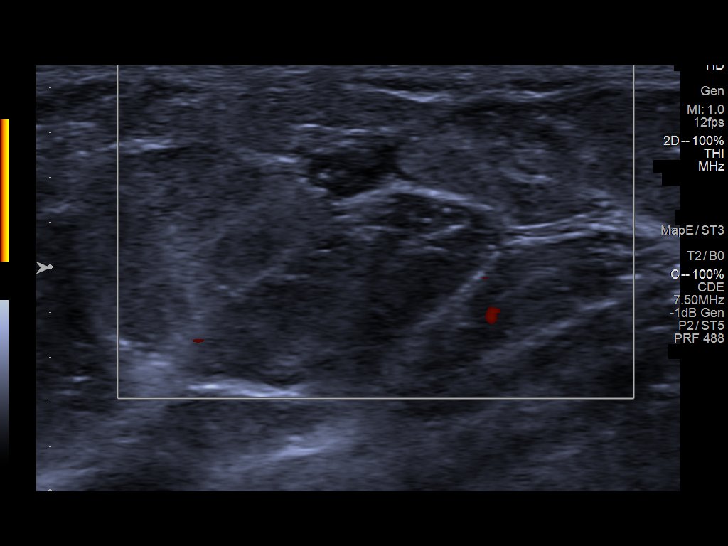
[im 4/13]
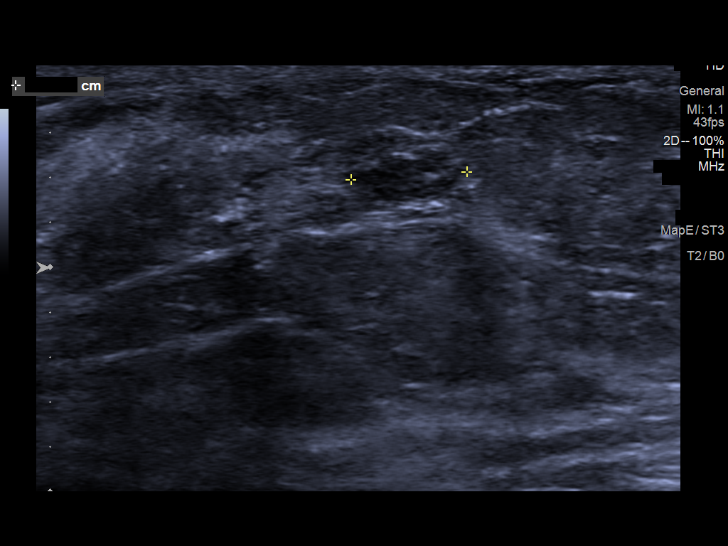
[im 5/13]
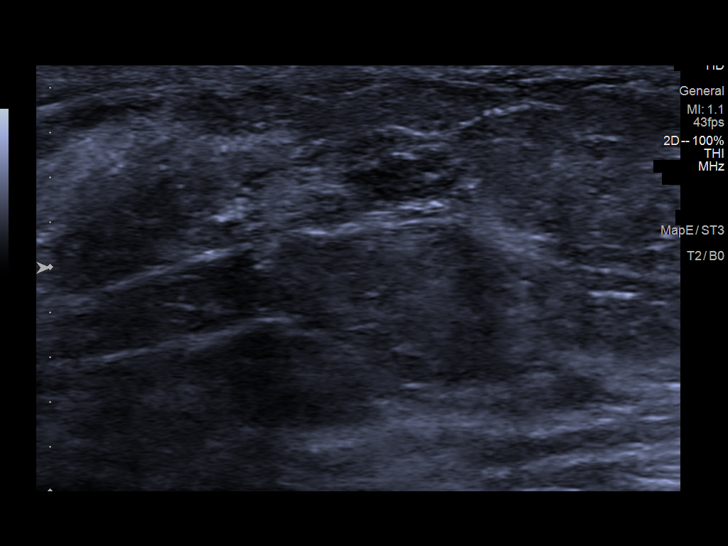
[im 6/13]
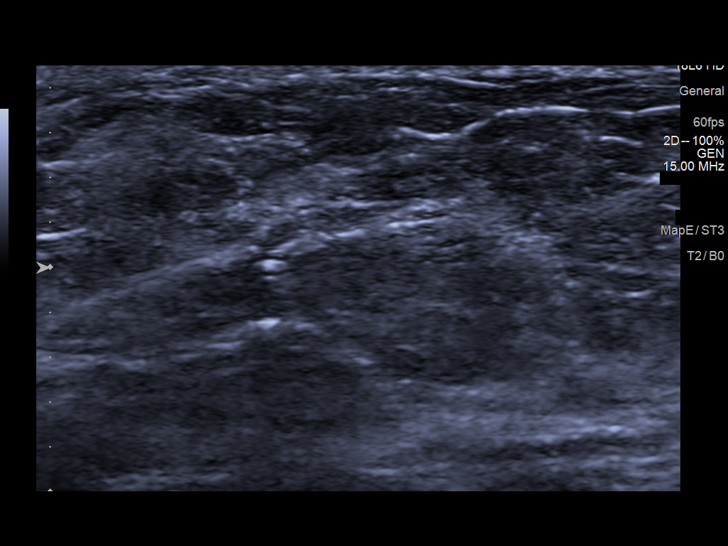
[im 8/13]
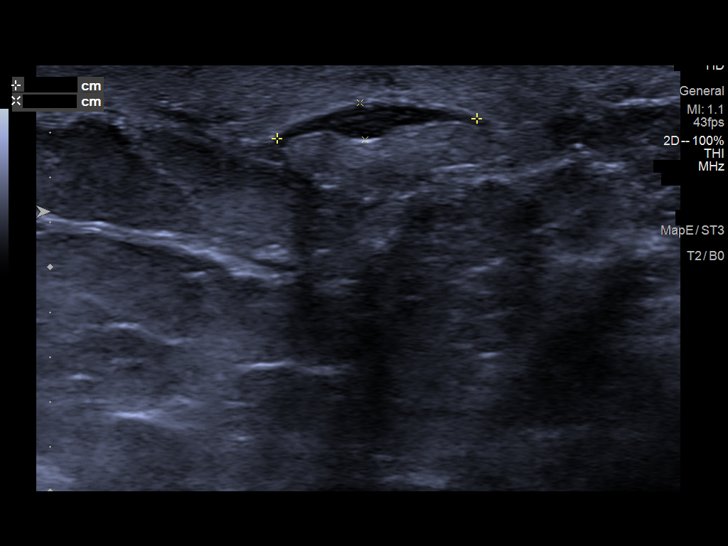
[im 9/13]
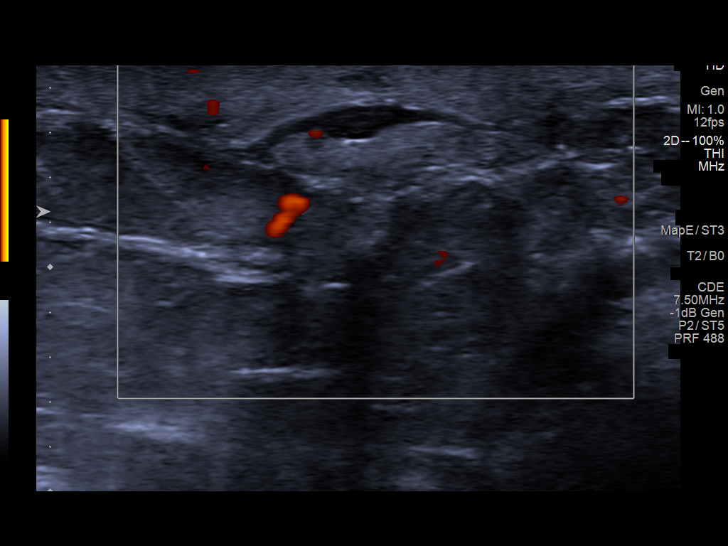
[im 10/13]
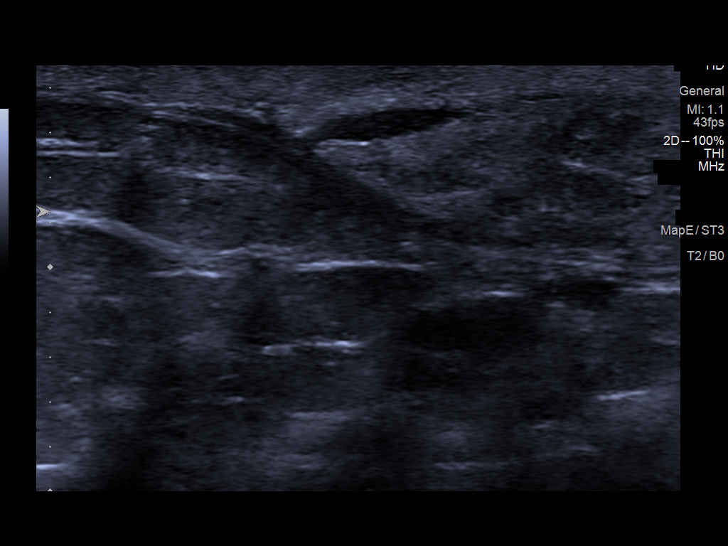
[im 11/13]
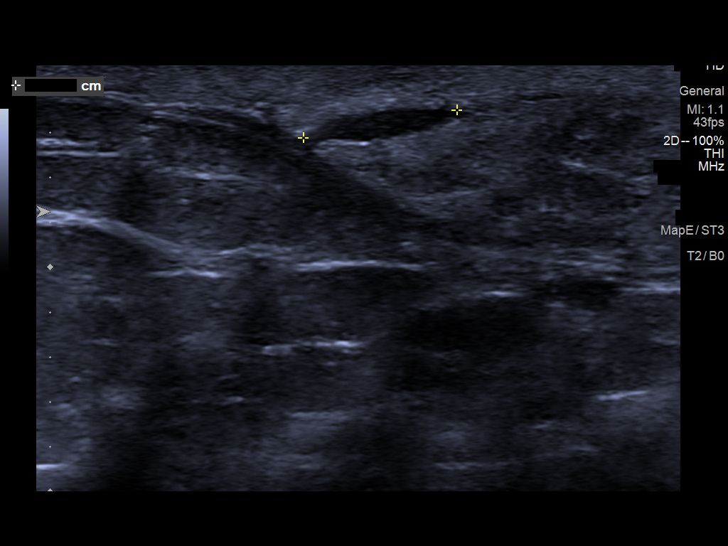
[im 12/13]
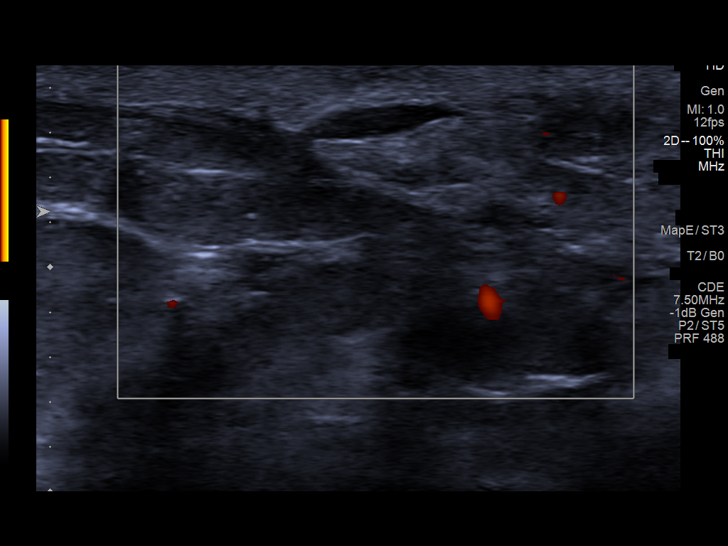
[im 13/13]
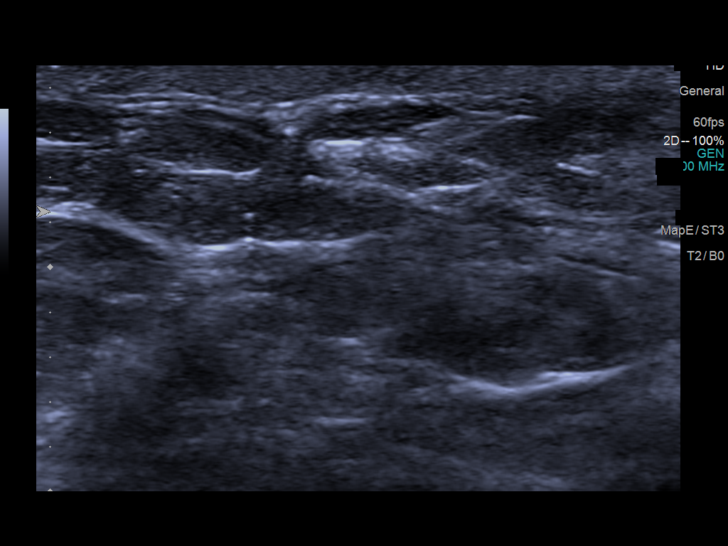

[12 of 13 positions shown; findings below may reference images not displayed]

ACR Breast Density Category b: There are scattered areas of
fibroglandular density.
FINDINGS: There is a persistent oval, circumscribed equal density mass in the
lower inner quadrant of the right breast at mid depth. Further
evaluation with ultrasound was performed.

Targeted ultrasound is performed, showing a slightly irregular, near
anechoic mass at the 5 o'clock position 5 cm from the nipple. It
measures 5 x 3 x 3 mm. There is peripheral but no internal
vascularity. This correlates well with the mammographic finding.
Evaluation of the right axilla demonstrates no suspicious
lymphadenopathy.
IMPRESSION: 1. Indeterminate right breast mass may represent a minimally
complicated cyst. Recommendation is for ultrasound-guided aspiration
versus biopsy.
2. No suspicious right axillary lymphadenopathy.

RECOMMENDATION:
Ultrasound-guided aspiration is recommended for the right breast
mass at the 5 o'clock position. If this does not aspirate to
completion, the procedure should be converted to a biopsy.

I have discussed the findings and recommendations with the patient.
If applicable, a reminder letter will be sent to the patient
regarding the next appointment.

BI-RADS CATEGORY  4: Suspicious.

ADDENDUM:
Post procedure mammogram on [DATE] demonstrated a persistent
cm oval circumscribed mass in the lower inner right breast, which do
not correspond with the benign cyst aspirated during earlier same
day procedure.

Repeat targeted ultrasound of the lower inner right breast was
performed by both the sonographer and the physician. No sonographic
correlate was identified.

Recommendation for stereotactic guided biopsy was discussed with the
patient by Dr. TIGER. Patient expressed understanding and agreement
with this plan. Biopsy will be scheduled for [DATE].

*** End of Addendum ***
ACR Breast Density Category b: There are scattered areas of
fibroglandular density.
FINDINGS: There is a persistent oval, circumscribed equal density mass in the
lower inner quadrant of the right breast at mid depth. Further
evaluation with ultrasound was performed.

Targeted ultrasound is performed, showing a slightly irregular, near
anechoic mass at the 5 o'clock position 5 cm from the nipple. It
measures 5 x 3 x 3 mm. There is peripheral but no internal
vascularity. This correlates well with the mammographic finding.
Evaluation of the right axilla demonstrates no suspicious
lymphadenopathy.
IMPRESSION: 1. Indeterminate right breast mass may represent a minimally
complicated cyst. Recommendation is for ultrasound-guided aspiration
versus biopsy.
2. No suspicious right axillary lymphadenopathy.

RECOMMENDATION:
Ultrasound-guided aspiration is recommended for the right breast
mass at the 5 o'clock position. If this does not aspirate to
completion, the procedure should be converted to a biopsy.

I have discussed the findings and recommendations with the patient.
If applicable, a reminder letter will be sent to the patient
regarding the next appointment.

BI-RADS CATEGORY  4: Suspicious.

## 2021-05-02 MED ORDER — ASPIRIN EC 81 MG PO TBEC: 81.0000 mg | DELAYED_RELEASE_TABLET | Freq: Every day | ORAL | Status: DC

## 2021-05-02 NOTE — Progress Notes (Signed)
Follow-up Outpatient Visit Date: 05/02/2021  Primary Care Provider: Venita Lick, NP [place] [address]  Chief Complaint: Follow-up coronary artery disease  HPI:  Ms. April Raymond is a 60 y.o. 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  She was last seen in our office in January by Ignacia Bayley, NP, at which time she reported stable shortness of breath in the morning, resolving with activity.  Most recent echo in January showed normal LVEF with small to moderate pericardial effusion overlying the right heart.  She is scheduled for excision of a chest wall lipoma extending to the pleura with Dr. Kipp Brood next week.  Today, Ms. April Raymond reports that she is feeling well from a heart standpoint, denying chest pain, shortness of breath, palpitations, lightheadedness, edema, and claudication.  She is undergoing workup of a mass/cyst in the right breast.  She is tolerating her medications well without side effects.  Bruising has improved with discontinuation of aspirin.  --------------------------------------------------------------------------------------------------  Past Medical History:  Diagnosis Date   Alpha-1-antitrypsin deficiency carrier 02/20/2021   MZ phenotype   CAD (coronary artery disease)    a. 2017 s/p prior MI & PCI RCA ([place]); b. 05/2020 Abnl ETT: 1.5-66m horizontal ST dep in inflat leads @ peak stress w/ freq PVCs/couplets/bigeminy. HTN response; b. 05/2020 NSTEMI/Cath: LM nl, LAD 45mLCX 20, RCA 35ost, patent prev placed prox-dist stent. EF 25-35% w/ apical  ballooning.   Hashimoto's disease    Hidradenitis suppurativa    Hydradenitis    [place]   [place]    [place]   Pericardial effusion    a. 05/2020 Echo: sm to mod circumferential peric eff w/o tamponade; b. 07/2020 Mod peric effusion; c. 09/2020 Echo: EF 60-65%, no rwma, small tomod effusion w/o compromise.   Takotsubo cardiomyopathy    a. 05/2020 Echo: EF 35-40%, glob HK, though basal wall motion best preserved consistent w/ stress induced CM. Gr1 DD. Nl RV size/fxn. Sm to mod circumferential pericardial effusion w/o tamponade.   Past Surgical History:  Procedure Laterality Date   BREAST EXCISIONAL BIOPSY Left    age 89's CARDIAC CATHETERIZATION     CYST EXCISION  x2   LEFT HEART CATH AND CORONARY ANGIOGRAPHY N/A 05/31/2020   Procedure: LEFT HEART CATH AND CORONARY ANGIOGRAPHY;  Surgeon: EnNelva BushMD;  [facility];  Service: Cardiovascular;  Laterality: N/A;   PARATHYROIDECTOMY     TONSILLECTOMY       Current Meds  Medication Sig   Albuterol Sulfate (PROAIR RESPICLICK) [id number] Base) MCG/ACT AEPB Inhale 2 puffs into the lungs every 6 (six) hours as needed (sob/wheezing).   atorvastatin (LIPITOR) 80 MG tablet Take 1 tablet (80 mg total) by mouth daily.   BRILINTA 60 MG TABS tablet TAKE 1 TABLET (60 MG TOTAL) BY MOUTH 2 (TWO) TIMES DAILY.   conjugated estrogens (PREMARIN) vaginal cream Place 0.5 grams twice weekly intravaginally.   furosemide (LASIX) 20 MG tablet TAKE 1 TABLET ONCE DAILY AND THE PM PILL TO BE TAKEN AS NEEDED FOR SHORTNESS OF BREATH OR SWELLING   levothyroxine (SYNTHROID) 75 MCG tablet TAKE 1 TABLET (75 MCG TOTAL) BY MOUTH DAILY BEFORE BREAKFAST.  losartan (COZAAR) 25 MG tablet Take 0.5 tablets (12.5 mg total) by mouth daily.   metoprolol succinate (TOPROL-XL) 25 MG 24 hr tablet TAKE 1/2 TABLET BY MOUTH EVERY DAY   nitroGLYCERIN (NITROSTAT) 0.4 MG SL tablet Place 1 tablet (0.4 mg total) under  the tongue every 5 (five) minutes as needed for chest pain.   pantoprazole (PROTONIX) 40 MG tablet TAKE 1 TABLET BY MOUTH EVERY DAY   umeclidinium-vilanterol (ANORO ELLIPTA) 62.5-25 MCG/INH AEPB Inhale 1 puff into the lungs daily.    Allergies: Humira [adalimumab]  Social History   Tobacco Use   Smoking status: Former    Packs/day: 1.00    Years: 40.00    Pack years: 40.00    Types: Cigarettes    Quit date: 09/18/2015    Years since quitting: 5.6   Smokeless tobacco: Never  Vaping Use   Vaping Use: Never used  Substance Use Topics   Alcohol use: Yes    Comment: on occasion   Drug use: Never    Family History  Problem Relation Age of Onset   Heart attack Mother 79   COPD Mother    Stroke Mother    Diverticulitis Mother    Cancer Mother        breast   Hypertension Mother    Breast cancer Mother 18   Hypertension Father    Stroke Maternal Grandfather    Alzheimer's disease Paternal Grandmother    Heart attack Paternal Grandfather    Breast cancer Cousin        mat cousin x 2   Hypertension Brother     Review of Systems: A 12-system review of systems was performed and was negative except as noted in the HPI.  --------------------------------------------------------------------------------------------------  Physical Exam: BP 120/80 (BP Location: Left Arm, Patient Position: Sitting, Cuff Size: Normal)   Pulse 68   Ht '5\' 6"'$  (1.676 m)   Wt 146 lb (66.2 kg)   LMP 11/16/2007 (Approximate)   SpO2 98%   BMI 23.57 kg/m   General:  NAD. Neck: No JVD or HJR. Lungs: Clear to auscultation bilaterally without wheezes or crackles. Heart: Regular rate and rhythm without murmurs, rubs, or gallops. Abdomen: Soft, nontender, nondistended. Extremities: No lower extremity edema.  EKG: Normal sinus rhythm with rightward axis, poor R wave progression in V1 and V2, and low voltage.  No significant change from prior tracing on 06/07/2020.  Lab Results  Component Value Date    WBC 7.1 06/01/2020   HGB 12.3 06/01/2020   HCT 35.4 (L) 06/01/2020   MCV 94.7 06/01/2020   PLT 353 06/01/2020    Lab Results  Component Value Date   NA 143 03/23/2021   K 4.0 03/23/2021   CL 102 03/23/2021   CO2 25 03/23/2021   BUN 25 03/23/2021   CREATININE 0.93 03/23/2021   GLUCOSE 90 03/23/2021   ALT 22 03/23/2021    Lab Results  Component Value Date   CHOL 147 03/23/2021   HDL 61 03/23/2021   LDLCALC 58 03/23/2021   TRIG 170 (H) 03/23/2021    --------------------------------------------------------------------------------------------------  ASSESSMENT AND PLAN: Coronary artery disease: No angina reported.  Continue aggressive secondary prevention with atorvastatin 80 mg daily and low-dose metoprolol.  We discussed current antiplatelet therapy and will switch to aspirin 81 mg daily beginning tomorrow in anticipation of lipoma resection next week.  When it is safe to do so from a postoperative standpoint, Ms. Tuffy should transition back from aspirin to ticagrelor 60 mg twice daily.  Stress-induced cardiomyopathy: LVEF normalized on most recent follow-up echo in January.  No signs or symptoms of heart failure.  Continue current doses of metoprolol, losartan, and furosemide.  Pericardial effusion: Small to moderate effusion overlying right atrium again noted on echo in January.  Given absence of symptoms, defer further work-up at this time.  PAD: No claudication reported.  ABIs normal in 06/2020 though there was suggestion of inflow disease on the right based on waveforms.  Given absence of symptoms, continue medical therapy.  Preoperative cardiovascular risk assessment: Ms. Dedeaux does not have any unstable cardiac symptoms at this time.  Catheterization in 05/2020 showed mild to moderate, nonobstructive CAD with patent RCA stents.  LVEF has normalized on most recent echo in 09/2020.  I think it is reasonable for Ms. Pincock to proceed with her lipoma resection without further  cardiac testing/intervention.  As outlined above, we will hold ticagrelor beginning tomorrow and begin aspirin 81 mg daily, to be continued in the perioperative period.  Once it is safe to do so, she can transition back from aspirin to ticagrelor 60 mg twice daily.  Follow-up: Return to clinic in 6 months.  Nelva Bush, MD 05/02/2021 10:42 AM

## 2021-05-02 NOTE — Patient Instructions (Signed)
Medication Instructions:   Your physician has recommended you make the following change in your medication:   1) STOP Brilinta prior to surgery. Take your LAST DOSE this evening 05/02/21.    -  You may restart per your surgeons instructions  2) START Aspirin 81 mg daily WHILE HOLDING BRILINTA. START FIRST dose tomorrow 05/03/21.   -  After you RESTART Brilinta, STOP taking Aspirin.   *If you need a refill on your cardiac medications before your next appointment, please call your pharmacy*   Lab Work:  None ordered  Testing/Procedures:  None ordered   Follow-Up: At [facility], you and your health needs are our priority.  As part of our continuing mission to provide you with exceptional heart care, we have created designated Provider Care Teams.  These Care Teams include your primary Cardiologist (physician) and Advanced Practice Providers (APPs -  Physician Assistants and Nurse Practitioners) who all work together to provide you with the care you need, when you need it.  We recommend signing up for the patient portal called "MyChart".  Sign up information is provided on this After Visit Summary.  MyChart is used to connect with patients for Virtual Visits (Telemedicine).  Patients are able to view lab/test results, encounter notes, upcoming appointments, etc.  Non-urgent messages can be sent to your provider as well.   To learn more about what you can do with MyChart, go to [email or web address].    Your next appointment:   6 month(s)  The format for your next appointment:   In Person  Provider:   You may see Nelva Bush, MD or one of the following Advanced Practice Providers on your designated Care Team:   Murray Hodgkins, NP Christell Faith, PA-C Marrianne Mood, PA-C Cadence [place], [place]

## 2021-05-04 ENCOUNTER — Ambulatory Visit
Admission: RE | Admit: 2021-05-04 | Discharge: 2021-05-04 | Disposition: A | Discharge status: Home / Self Care | Payer: BLUE CROSS/BLUE SHIELD | Source: Ambulatory Visit | Attending: Nurse Practitioner | Admitting: Nurse Practitioner

## 2021-05-04 ENCOUNTER — Other Ambulatory Visit: Payer: Self-pay

## 2021-05-04 DIAGNOSIS — R928 Other abnormal and inconclusive findings on diagnostic imaging of breast: Secondary | ICD-10-CM | POA: Diagnosis not present

## 2021-05-04 DIAGNOSIS — N6310 Unspecified lump in the right breast, unspecified quadrant: Secondary | ICD-10-CM | POA: Diagnosis present

## 2021-05-04 HISTORY — PX: BREAST BIOPSY: SHX20

## 2021-05-04 IMAGING — MG MM BREAST BX W/ LOC DEV 1ST LESION IMAGE BX SPEC STEREO GUIDE*R*
8 of 12 series · 8 of 24 positions shown · non-contrast
Comparison: Previous exams.
COMPARISON: Previous exams.

Addendum:
CLINICAL DATA: 0.5 cm mass in the lower inner right breast

EXAM:
RIGHT BREAST STEREOTACTIC CORE NEEDLE BIOPSY

[R (1 of 6)]
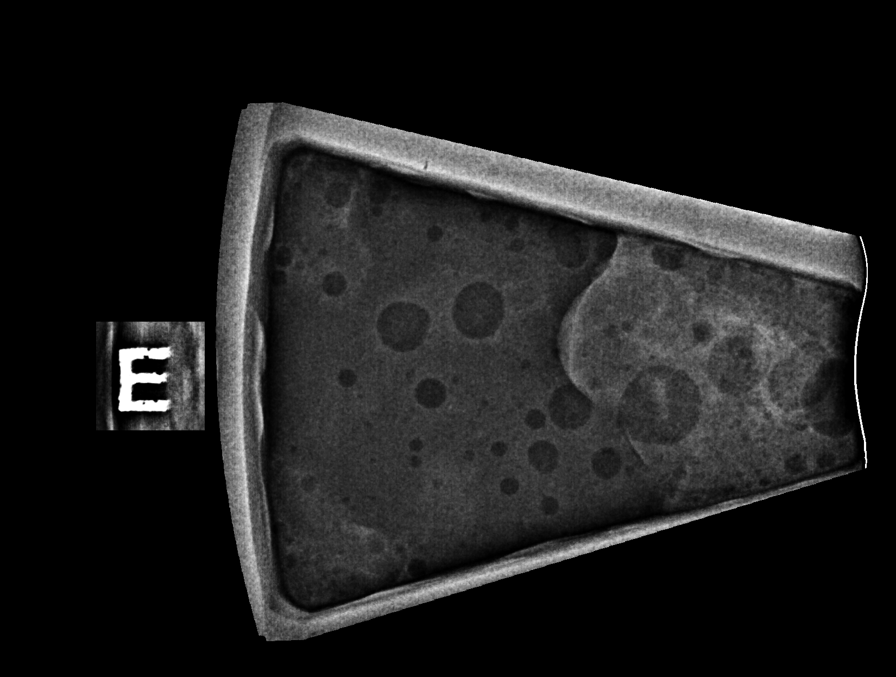

[R (2 of 6)]
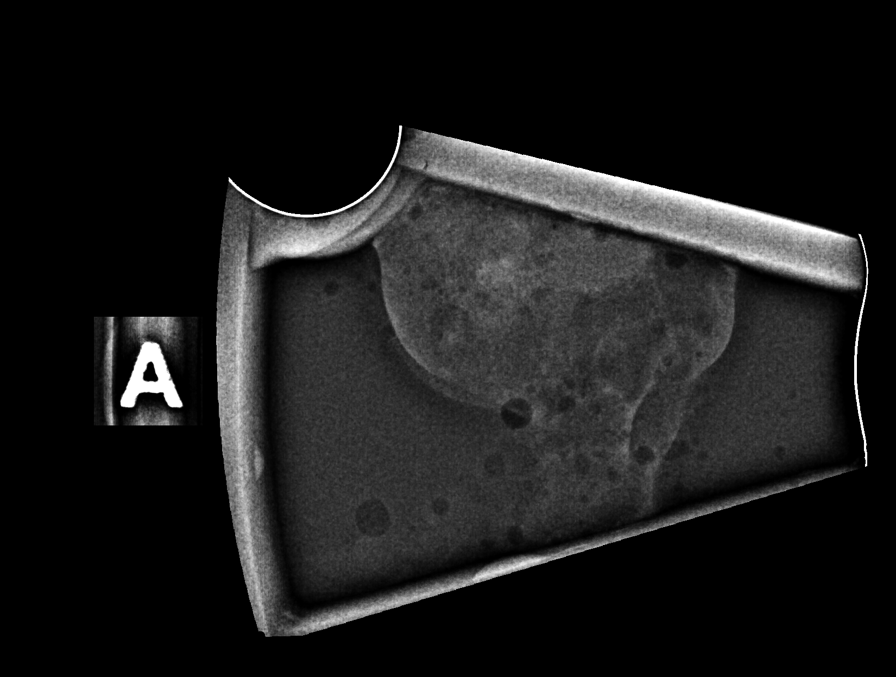

[R (3 of 6)]
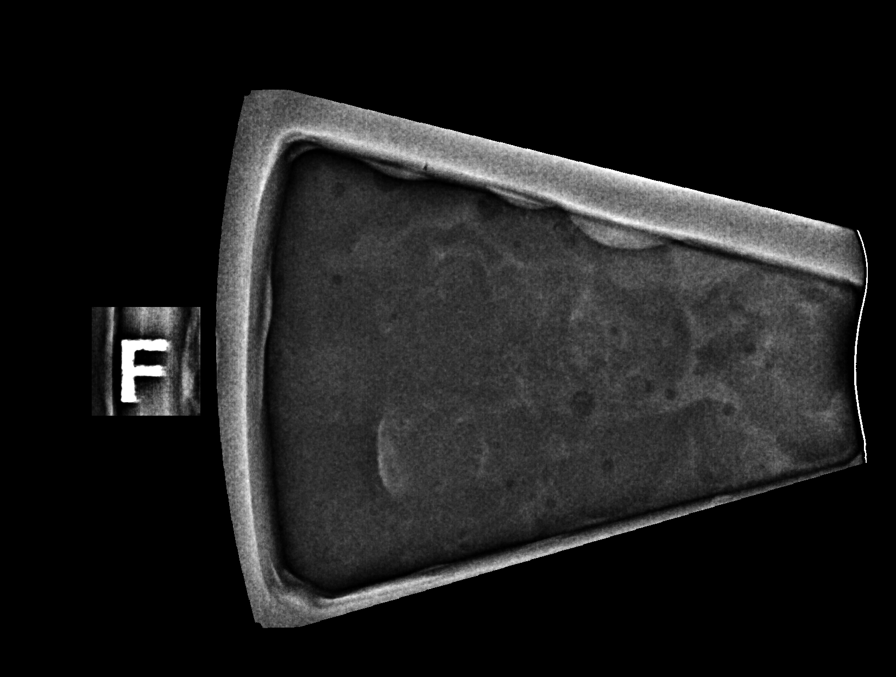

[R (4 of 6)]
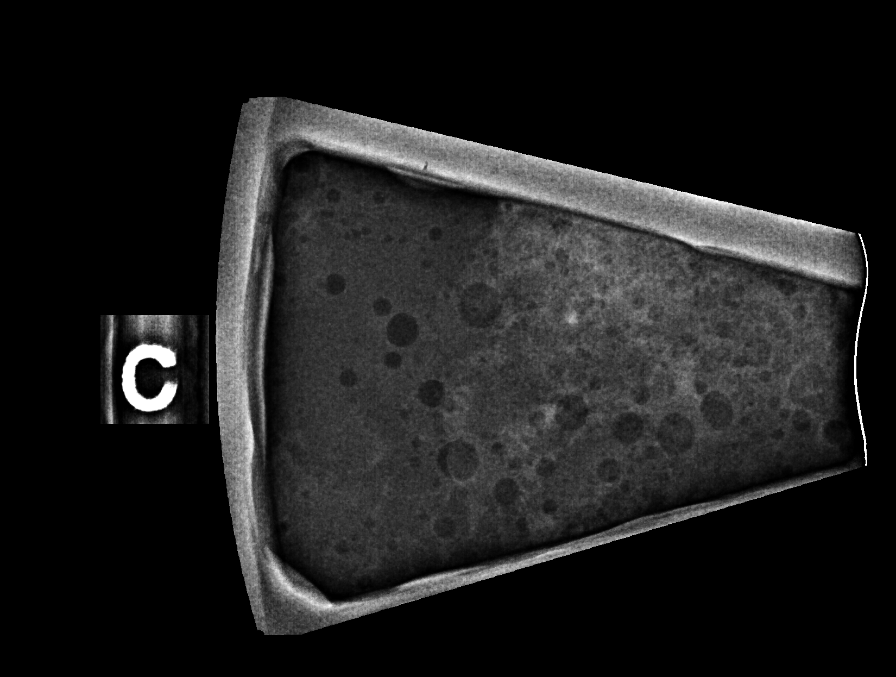

[R (5 of 6)]
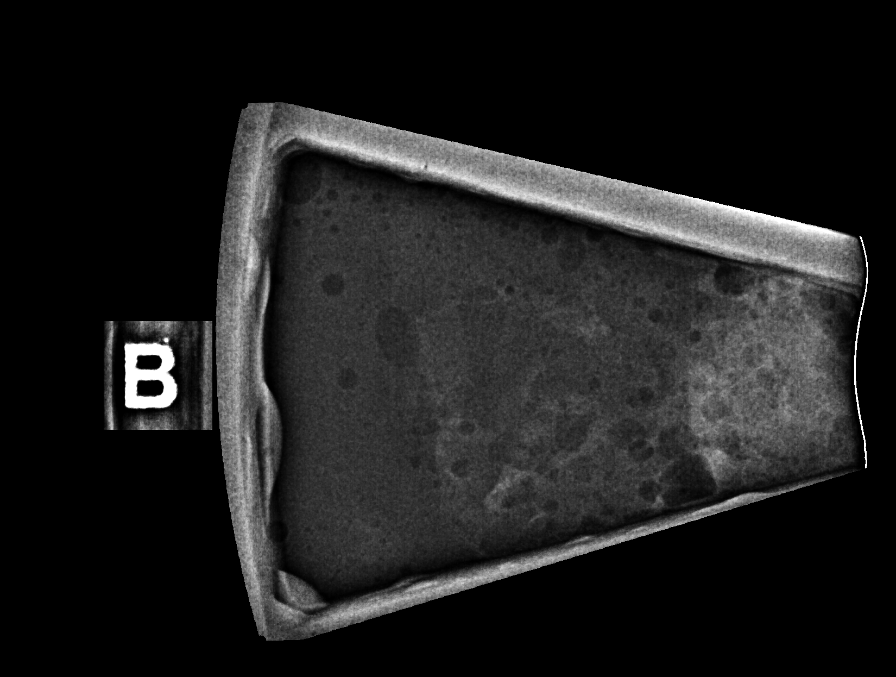

[R (6 of 6)]
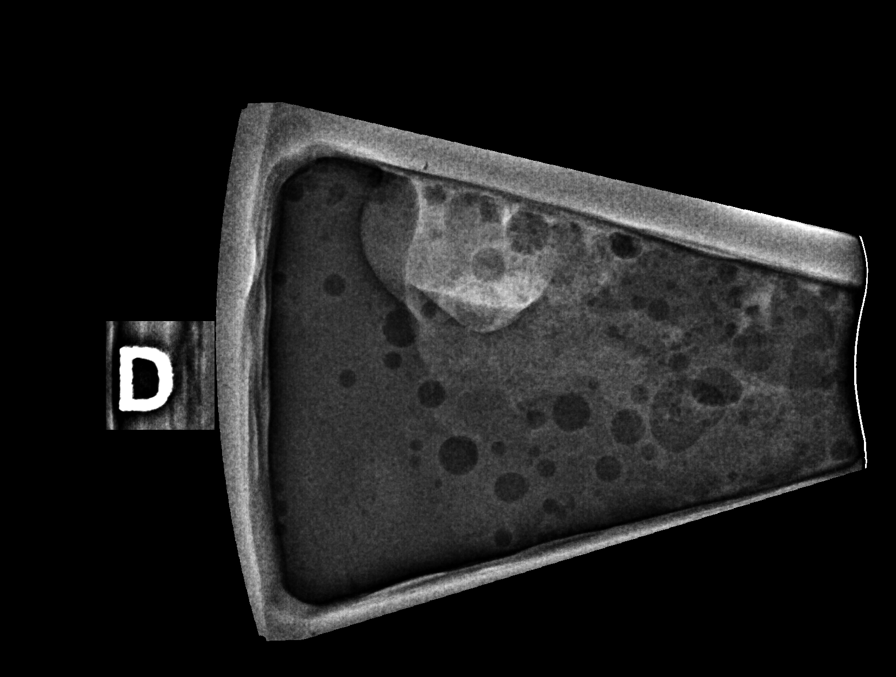

[R ML (1 of 2)]
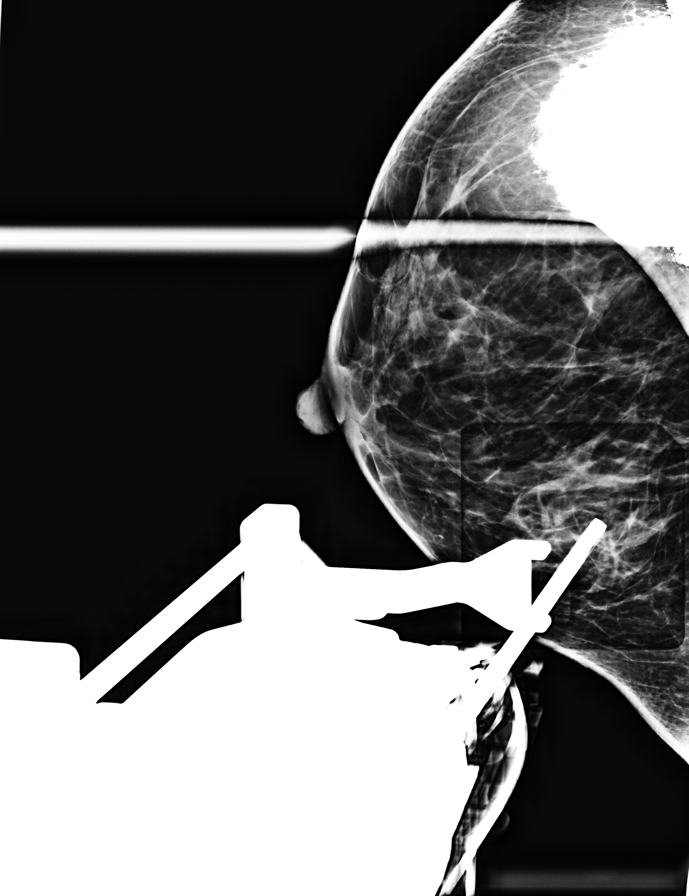

[R ML (2 of 2)]
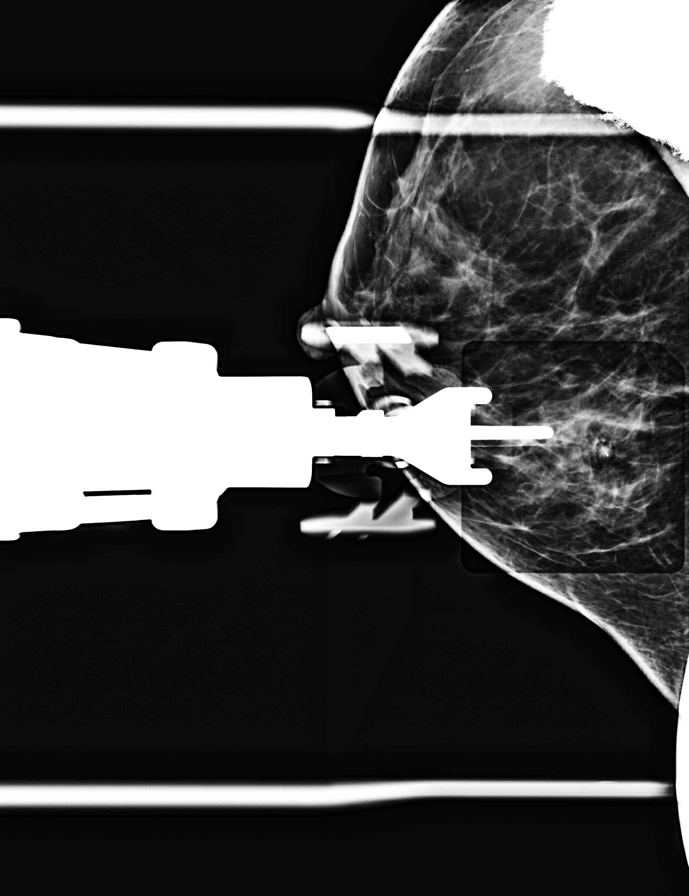

[8 of 24 positions shown; findings below may reference images not displayed]



Using sterile technique and 1% Lidocaine as local anesthetic, under
stereotactic guidance, a 9 gauge vacuum assisted device was used to
perform core needle biopsy of a 0.5 cm mass in the lower inner right
breast using a medial approach. Specimen radiograph was performed.

Lesion quadrant: Lower inner quadrant

At the conclusion of the procedure, an X shaped tissue marker clip
was deployed into the biopsy cavity. Follow-up 2-view mammogram was
performed and dictated separately.
IMPRESSION: Stereotactic-guided biopsy of a 0.5 cm mass in the lower inner right
breast. No apparent complications.

ADDENDUM:
PATHOLOGY revealed: A. RIGHT BREAST, LOWER INNER QUADRANT;
STEREOTACTIC BIOPSY: - BENIGN BREAST TISSUE WITH MILD STROMAL
FIBROSIS AND HEMORRHAGE / HEMATOMA. Comment: There are no definitive
changes seen to account for the circumscribed mass described on
imaging studies. The findings may not be representative of the
targeted lesion. Clinical correlation is recommended.

Pathology results are CONCORDANT with imaging findings, per Dr.
RINDANG BIBIT.

Pathology results and recommendations were discussed with patient
via telephone on [DATE]. Patient reported doing well after the
biopsy with no adverse symptoms, and only slight tenderness at the
site. Post biopsy care instructions were reviewed, questions were
answered and my direct phone number was provided. Patient was
instructed to call [HOSPITAL] for any additional
questions or concerns related to biopsy site.

Recommendation: Patient instructed to return in six months for
unilateral RIGHT breast diagnostic mammogram and possible ultrasound
to ensure stability of biopsied area. Patient informed a reminder
notice will be sent regarding this appointment and she will need to
call mammography site to schedule this appointment.

Pathology results reported by RINDANG BIBIT RN on [DATE].



Using sterile technique and 1% Lidocaine as local anesthetic, under
stereotactic guidance, a 9 gauge vacuum assisted device was used to
perform core needle biopsy of a 0.5 cm mass in the lower inner right
breast using a medial approach. Specimen radiograph was performed.

Lesion quadrant: Lower inner quadrant

At the conclusion of the procedure, an X shaped tissue marker clip
was deployed into the biopsy cavity. Follow-up 2-view mammogram was
performed and dictated separately.
IMPRESSION: Stereotactic-guided biopsy of a 0.5 cm mass in the lower inner right
breast. No apparent complications.

## 2021-05-04 IMAGING — MG MM BREAST LOCALIZATION CLIP
4 series · 4 of 12 positions shown · non-contrast
Comparison: Previous exam(s).

CLINICAL DATA: Postprocedure mammogram

EXAM:
3D DIAGNOSTIC RIGHT MAMMOGRAM POST STEREOTACTIC BIOPSY

[R ML synth-2D]
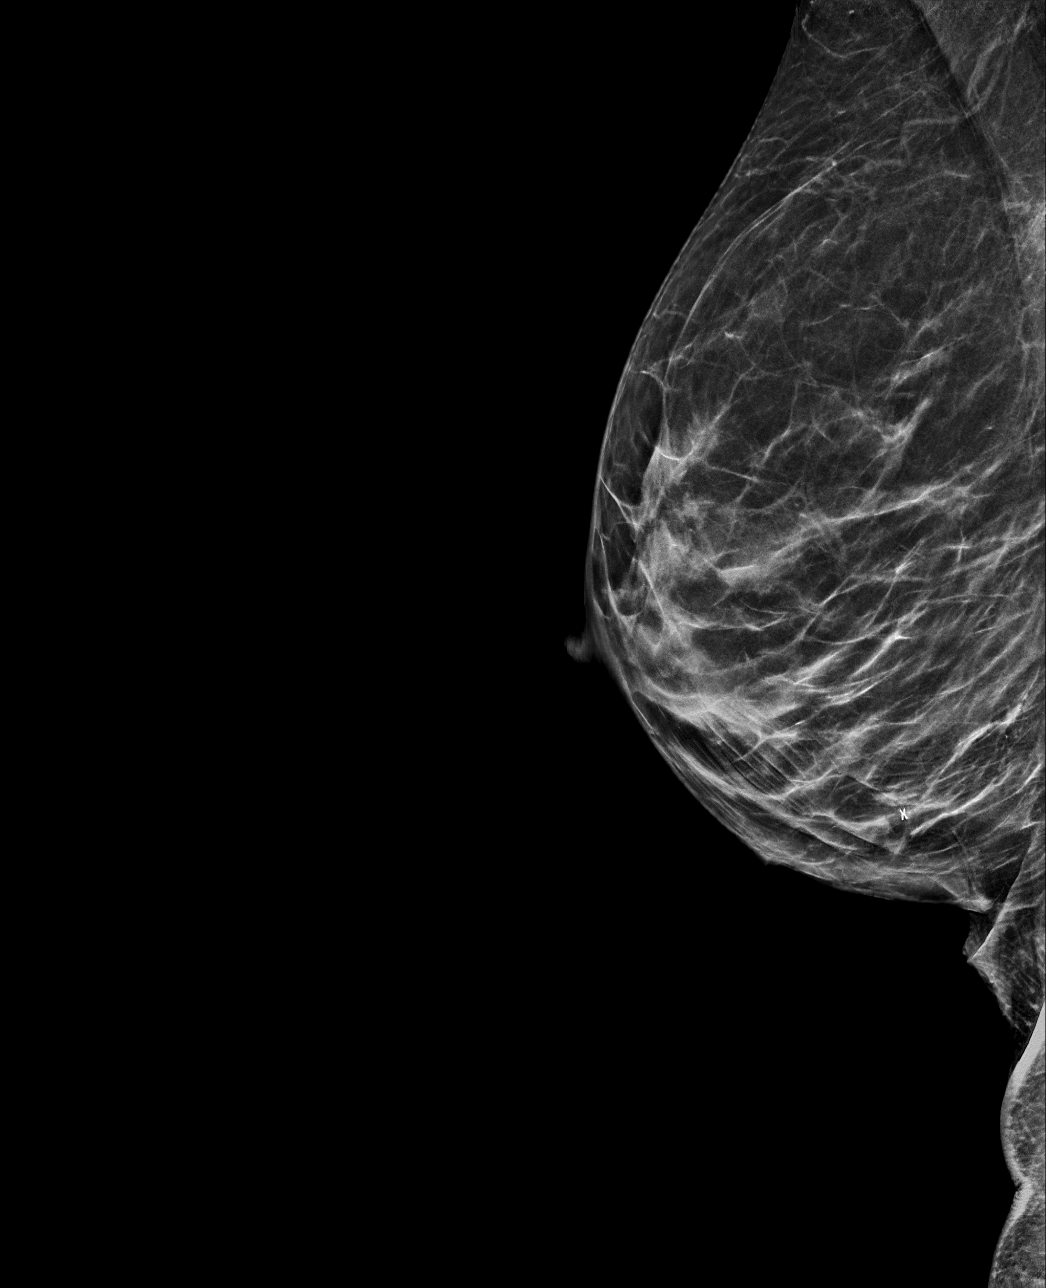

[R CC synth-2D]
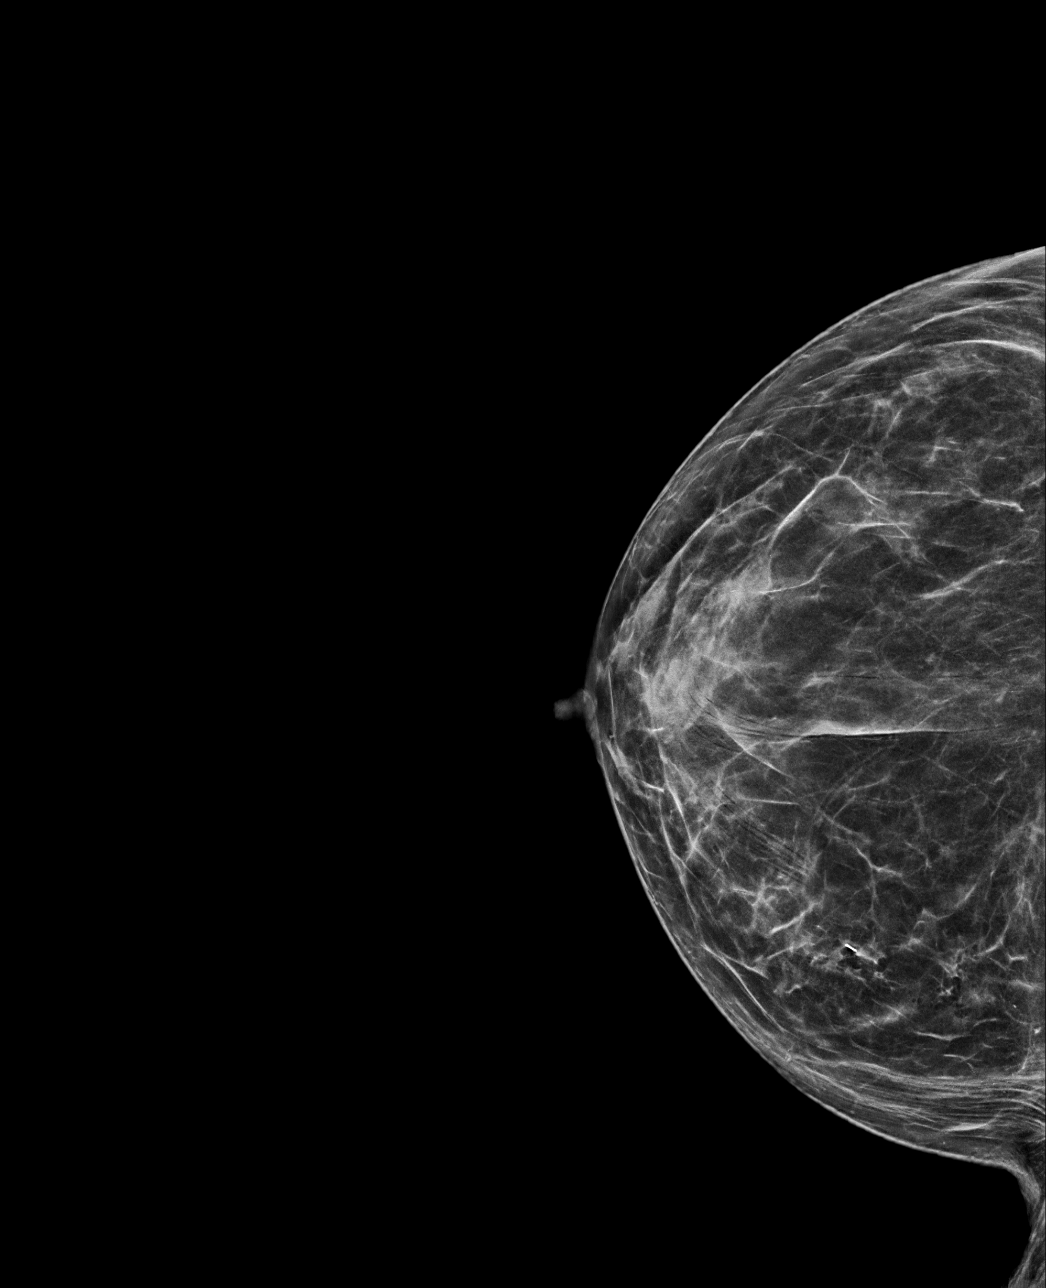

[R ML tomo · tomo slice 29/57.0]
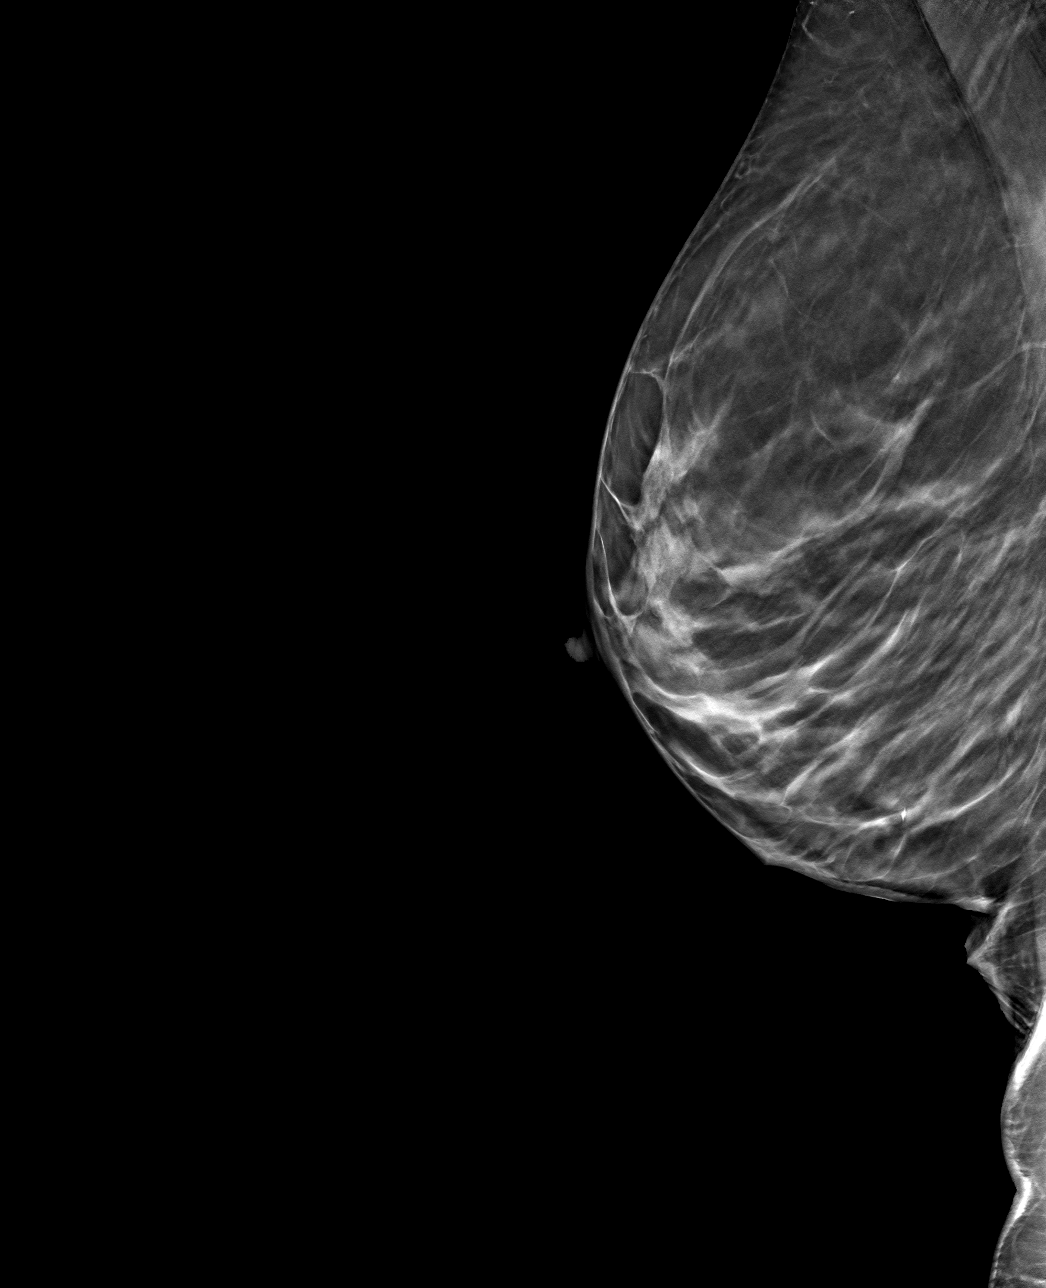

[R CC tomo · tomo slice 29/57.0]
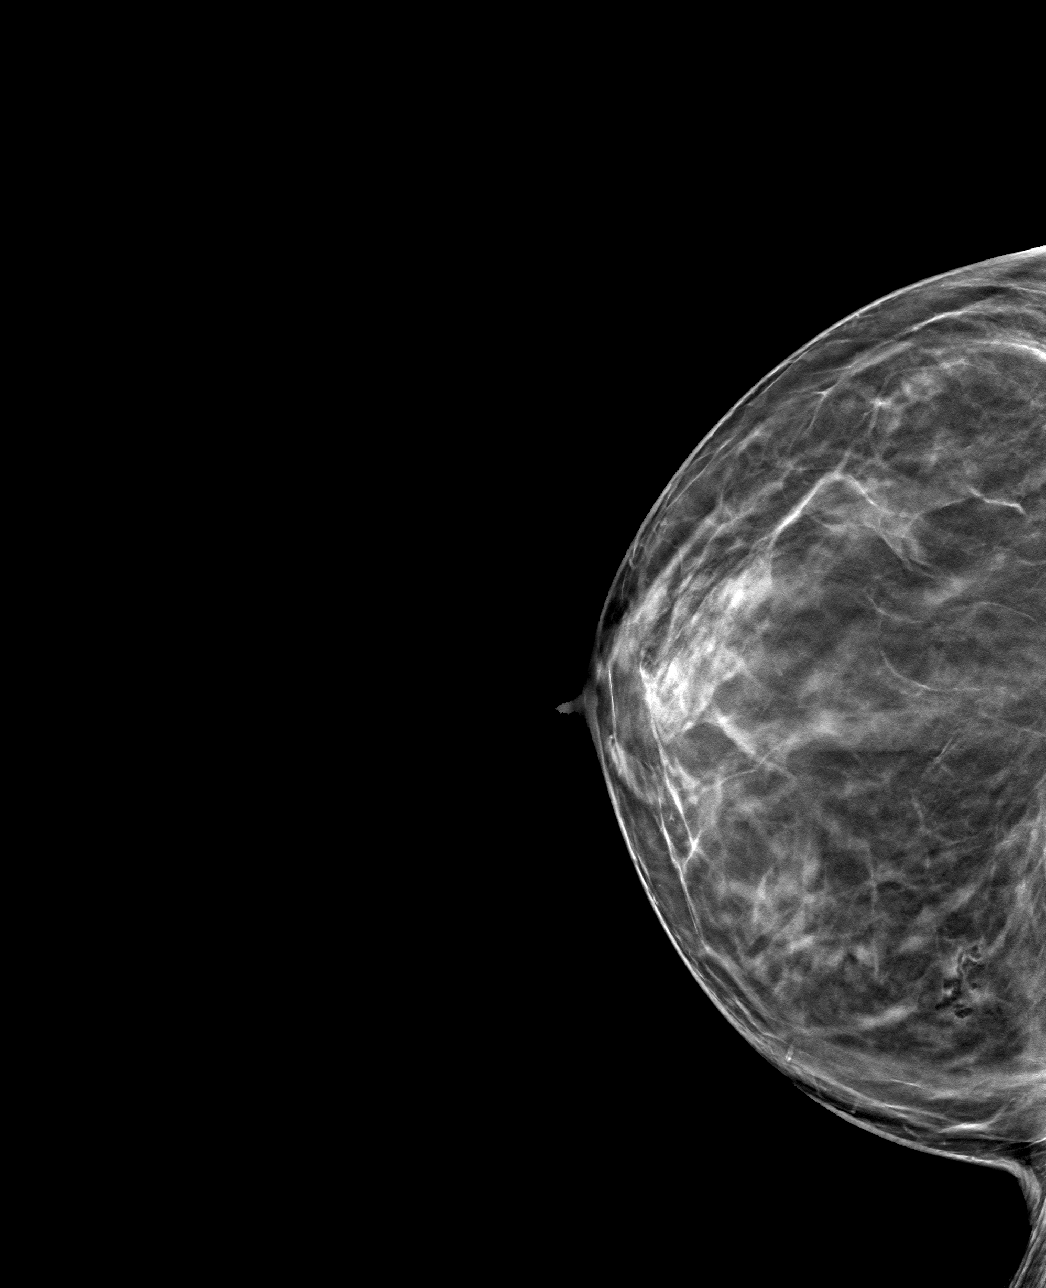

[4 of 12 positions shown; findings below may reference images not displayed]

FINDINGS: 3D Mammographic images were obtained following stereotactic guided
biopsy of 0.5 cm mass in the lower inner right breast. The biopsy
marking clip is in expected position at the site of biopsy.
IMPRESSION: Appropriate positioning of the X shaped biopsy marking clip at the
site of biopsy in the lower inner right breast at middle depth.

Final Assessment: Post Procedure Mammograms for Marker Placement

## 2021-05-04 NOTE — Progress Notes (Signed)
Surgical Instructions    Your procedure is scheduled on Wednesday, August 24th. 2022.   Report to [facility] Main Entrance "A" at 05:30 A.M., then check in with the Admitting office.  Call this number if you have problems the morning of surgery:  [phone]   If you have any questions prior to your surgery date call [phone]: Open Monday-Friday 8am-4pm    Remember:  Do not eat or drink after midnight the night before your surgery    Take these medicines the morning of surgery with A SIP OF WATER:  atorvastatin (LIPITOR)  levothyroxine (SYNTHROID) metoprolol succinate (TOPROL-XL)  pantoprazole (PROTONIX)   If needed:  Albuterol Sulfate (PROAIR RESPICLICK) - please, bring the inhaler with you the day of surgery nitroGLYCERIN (NITROSTAT)  umeclidinium-vilanterol (ANORO ELLIPTA)  Per Dr. Saunders Revel, Harrell Gave (note from 05/02/2021) - As outlined above, we will hold ticagrelor beginning tomorrow and begin aspirin 81 mg daily, to be continued in the perioperative period.  Once it is safe to do so, she can transition back from aspirin to ticagrelor 60 mg twice daily.  Follow your surgeon's instructions on when to stop Aspirin.  If no instructions were given by your surgeon then you will need to call the office to get those instructions.     As of today, STOP taking any Aspirin (unless otherwise instructed by your surgeon) Aleve, Naproxen, Ibuprofen, Motrin, Advil, Goody's, BC's, all herbal medications, fish oil, and all vitamins.          Do not wear jewelry or makeup Do not wear lotions, powders, perfumes, or deodorant. Do not shave 48 hours prior to surgery.   Do not bring valuables to the hospital. DO Not wear nail polish, gel polish, artificial nails, or any other type of covering on natural nails including finger and toenails. If patients have artificial nails, gel coating, etc. that need to be removed by a nail salon please have this removed prior to surgery or surgery may need  to be canceled/delayed if the surgeon/ anesthesia feels like the patient is unable to be adequately monitored.             [facility] is not responsible for any belongings or valuables.  Do NOT Smoke (Tobacco/Vaping) or drink Alcohol 24 hours prior to your procedure If you use a CPAP at night, you may bring all equipment for your overnight stay.   Contacts, glasses, dentures or bridgework may not be worn into surgery, please bring cases for these belongings   For patients admitted to the hospital, discharge time will be determined by your treatment team.   Patients discharged the day of surgery will not be allowed to drive home, and someone needs to stay with them for 24 hours.  ONLY 1 SUPPORT PERSON MAY BE PRESENT WHILE YOU ARE IN SURGERY. IF YOU ARE TO BE ADMITTED ONCE YOU ARE IN YOUR ROOM YOU WILL BE ALLOWED TWO (2) VISITORS.  Minor children may have two parents present. Special consideration for safety and communication needs will be reviewed on a case by case basis.  Special instructions:    Oral Hygiene is also important to reduce your risk of infection.  Remember - BRUSH YOUR TEETH THE MORNING OF SURGERY WITH YOUR REGULAR TOOTHPASTE   [facility]- Preparing For Surgery  Before surgery, you can play an important role. Because skin is not sterile, your skin needs to be as free of germs as possible. You can reduce the number of germs on your skin by washing  with CHG (chlorahexidine gluconate) Soap before surgery.  CHG is an antiseptic cleaner which kills germs and bonds with the skin to continue killing germs even after washing.     Please do not use if you have an allergy to CHG or antibacterial soaps. If your skin becomes reddened/irritated stop using the CHG.  Do not shave (including legs and underarms) for at least 48 hours prior to first CHG shower. It is OK to shave your face.  Please follow these instructions carefully.     Shower the NIGHT BEFORE SURGERY and the MORNING OF  SURGERY with CHG Soap.   If you chose to wash your hair, wash your hair first as usual with your normal shampoo. After you shampoo, rinse your hair and body thoroughly to remove the shampoo.  Then [employer] and genitals (private parts) with your normal soap and rinse thoroughly to remove soap.  After that Use CHG Soap as you would any other liquid soap. You can apply CHG directly to the skin and wash gently with a scrungie or a clean washcloth.   Apply the CHG Soap to your body ONLY FROM THE NECK DOWN.  Do not use on open wounds or open sores. Avoid contact with your eyes, ears, mouth and genitals (private parts). Wash Face and genitals (private parts)  with your normal soap.   Wash thoroughly, paying special attention to the area where your surgery will be performed.  Thoroughly rinse your body with warm water from the neck down.  DO NOT shower/wash with your normal soap after using and rinsing off the CHG Soap.  Pat yourself dry with a CLEAN TOWEL.  Wear CLEAN PAJAMAS to bed the night before surgery  Place CLEAN SHEETS on your bed the night before your surgery  DO NOT SLEEP WITH PETS.   Day of Surgery:  Take a shower with CHG soap. Wear Clean/Comfortable clothing the morning of surgery Do not apply any deodorants/lotions.   Remember to brush your teeth WITH YOUR REGULAR TOOTHPASTE.   Please read over the following fact sheets that you were given.

## 2021-05-05 ENCOUNTER — Encounter: Payer: Self-pay | Admitting: Dermatology

## 2021-05-07 ENCOUNTER — Ambulatory Visit (HOSPITAL_COMMUNITY)
Admission: RE | Admit: 2021-05-07 | Discharge: 2021-05-07 | Disposition: A | Discharge status: Home / Self Care | Payer: BC Managed Care – PPO | Source: Ambulatory Visit | Attending: Thoracic Surgery (Cardiothoracic Vascular Surgery) | Admitting: Thoracic Surgery (Cardiothoracic Vascular Surgery)

## 2021-05-07 ENCOUNTER — Encounter (HOSPITAL_COMMUNITY)
Admission: RE | Admit: 2021-05-07 | Discharge: 2021-05-07 | Disposition: A | Discharge status: Home / Self Care | Payer: BC Managed Care – PPO | Source: Ambulatory Visit | Attending: Thoracic Surgery (Cardiothoracic Vascular Surgery) | Admitting: Thoracic Surgery (Cardiothoracic Vascular Surgery)

## 2021-05-07 ENCOUNTER — Encounter (HOSPITAL_COMMUNITY): Payer: Self-pay

## 2021-05-07 ENCOUNTER — Other Ambulatory Visit: Payer: Self-pay

## 2021-05-07 DIAGNOSIS — R222 Localized swelling, mass and lump, trunk: Secondary | ICD-10-CM | POA: Diagnosis not present

## 2021-05-07 DIAGNOSIS — Z79899 Other long term (current) drug therapy: Secondary | ICD-10-CM | POA: Diagnosis not present

## 2021-05-07 DIAGNOSIS — Z7901 Long term (current) use of anticoagulants: Secondary | ICD-10-CM | POA: Insufficient documentation

## 2021-05-07 DIAGNOSIS — I252 Old myocardial infarction: Secondary | ICD-10-CM | POA: Insufficient documentation

## 2021-05-07 DIAGNOSIS — Z955 Presence of coronary angioplasty implant and graft: Secondary | ICD-10-CM | POA: Insufficient documentation

## 2021-05-07 DIAGNOSIS — Z20822 Contact with and (suspected) exposure to covid-19: Secondary | ICD-10-CM | POA: Insufficient documentation

## 2021-05-07 DIAGNOSIS — I2510 Atherosclerotic heart disease of native coronary artery without angina pectoris: Secondary | ICD-10-CM | POA: Diagnosis not present

## 2021-05-07 DIAGNOSIS — Z87891 Personal history of nicotine dependence: Secondary | ICD-10-CM | POA: Insufficient documentation

## 2021-05-07 DIAGNOSIS — J449 Chronic obstructive pulmonary disease, unspecified: Secondary | ICD-10-CM | POA: Diagnosis not present

## 2021-05-07 DIAGNOSIS — Z01818 Encounter for other preprocedural examination: Secondary | ICD-10-CM

## 2021-05-07 DIAGNOSIS — I10 Essential (primary) hypertension: Secondary | ICD-10-CM | POA: Diagnosis not present

## 2021-05-07 HISTORY — DX: Angina pectoris, unspecified: I20.9

## 2021-05-07 HISTORY — DX: Chronic obstructive pulmonary disease, unspecified: J44.9

## 2021-05-07 LAB — URINALYSIS, ROUTINE W REFLEX MICROSCOPIC
Bilirubin Urine: NEGATIVE
Glucose, UA: NEGATIVE mg/dL
Hgb urine dipstick: NEGATIVE
Ketones, ur: NEGATIVE mg/dL
Leukocytes,Ua: NEGATIVE
Nitrite: NEGATIVE
Protein, ur: NEGATIVE mg/dL
Specific Gravity, Urine: 1.013 (ref 1.005–1.030)
pH: 6 (ref 5.0–8.0)

## 2021-05-07 LAB — COMPREHENSIVE METABOLIC PANEL
ALT: 20 U/L (ref 0–44)
AST: 26 U/L (ref 15–41)
Albumin: 3.9 g/dL (ref 3.5–5.0)
Alkaline Phosphatase: 36 U/L — ABNORMAL LOW (ref 38–126)
Anion gap: 9 (ref 5–15)
BUN: 27 mg/dL — ABNORMAL HIGH (ref 8–23)
CO2: 24 mmol/L (ref 22–32)
Calcium: 8.9 mg/dL (ref 8.9–10.3)
Chloride: 104 mmol/L (ref 98–111)
Creatinine, Ser: 0.95 mg/dL (ref 0.44–1.00)
GFR, Estimated: >60 mL/min (ref >60)
Glucose, Bld: 130 mg/dL — ABNORMAL HIGH (ref 70–99)
Potassium: 4.2 mmol/L (ref 3.5–5.1)
Sodium: 137 mmol/L (ref 135–145)
Total Bilirubin: 0.5 mg/dL (ref 0.3–1.2)
Total Protein: 6.4 g/dL — ABNORMAL LOW (ref 6.5–8.1)

## 2021-05-07 LAB — BLOOD GAS, ARTERIAL
Acid-Base Excess: 0.9 mmol/L (ref 0.0–2.0)
Bicarbonate: 24.8 mmol/L (ref 20.0–28.0)
FIO2: 21
O2 Saturation: 97.5 %
Patient temperature: 37
pCO2 arterial: 38.7 mmHg (ref 32.0–48.0)
pH, Arterial: 7.423 (ref 7.350–7.450)
pO2, Arterial: 88.8 mmHg (ref 83.0–108.0)

## 2021-05-07 LAB — CBC
HCT: 34.9 % — ABNORMAL LOW (ref 36.0–46.0)
Hemoglobin: 11.6 g/dL — ABNORMAL LOW (ref 12.0–15.0)
MCH: 32.8 pg (ref 26.0–34.0)
MCHC: 33.2 g/dL (ref 30.0–36.0)
MCV: 98.6 fL (ref 80.0–100.0)
Platelets: 343 10*3/uL (ref 150–400)
RBC: 3.54 MIL/uL — ABNORMAL LOW (ref 3.87–5.11)
RDW: 14.4 % (ref 11.5–15.5)
WBC: 6 10*3/uL (ref 4.0–10.5)
nRBC: 0 % (ref 0.0–0.2)

## 2021-05-07 LAB — SURGICAL PCR SCREEN
MRSA, PCR: NEGATIVE
Staphylococcus aureus: NEGATIVE

## 2021-05-07 LAB — TYPE AND SCREEN
ABO/RH(D): A POS
Antibody Screen: NEGATIVE

## 2021-05-07 LAB — SURGICAL PATHOLOGY

## 2021-05-07 LAB — PROTIME-INR
INR: 1 (ref 0.8–1.2)
Prothrombin Time: 12.8 seconds (ref 11.4–15.2)

## 2021-05-07 LAB — APTT: aPTT: 25 seconds (ref 24–36)

## 2021-05-07 LAB — SARS CORONAVIRUS 2 (TAT 6-24 HRS): SARS Coronavirus 2: NEGATIVE

## 2021-05-07 IMAGING — CR DG CHEST 2V
2 series · 2 of 2 positions shown · non-contrast
Comparison: None.

CLINICAL DATA: Pre-admission for chest wall mass.

EXAM:
CHEST - 2 VIEW

[w chest pa]
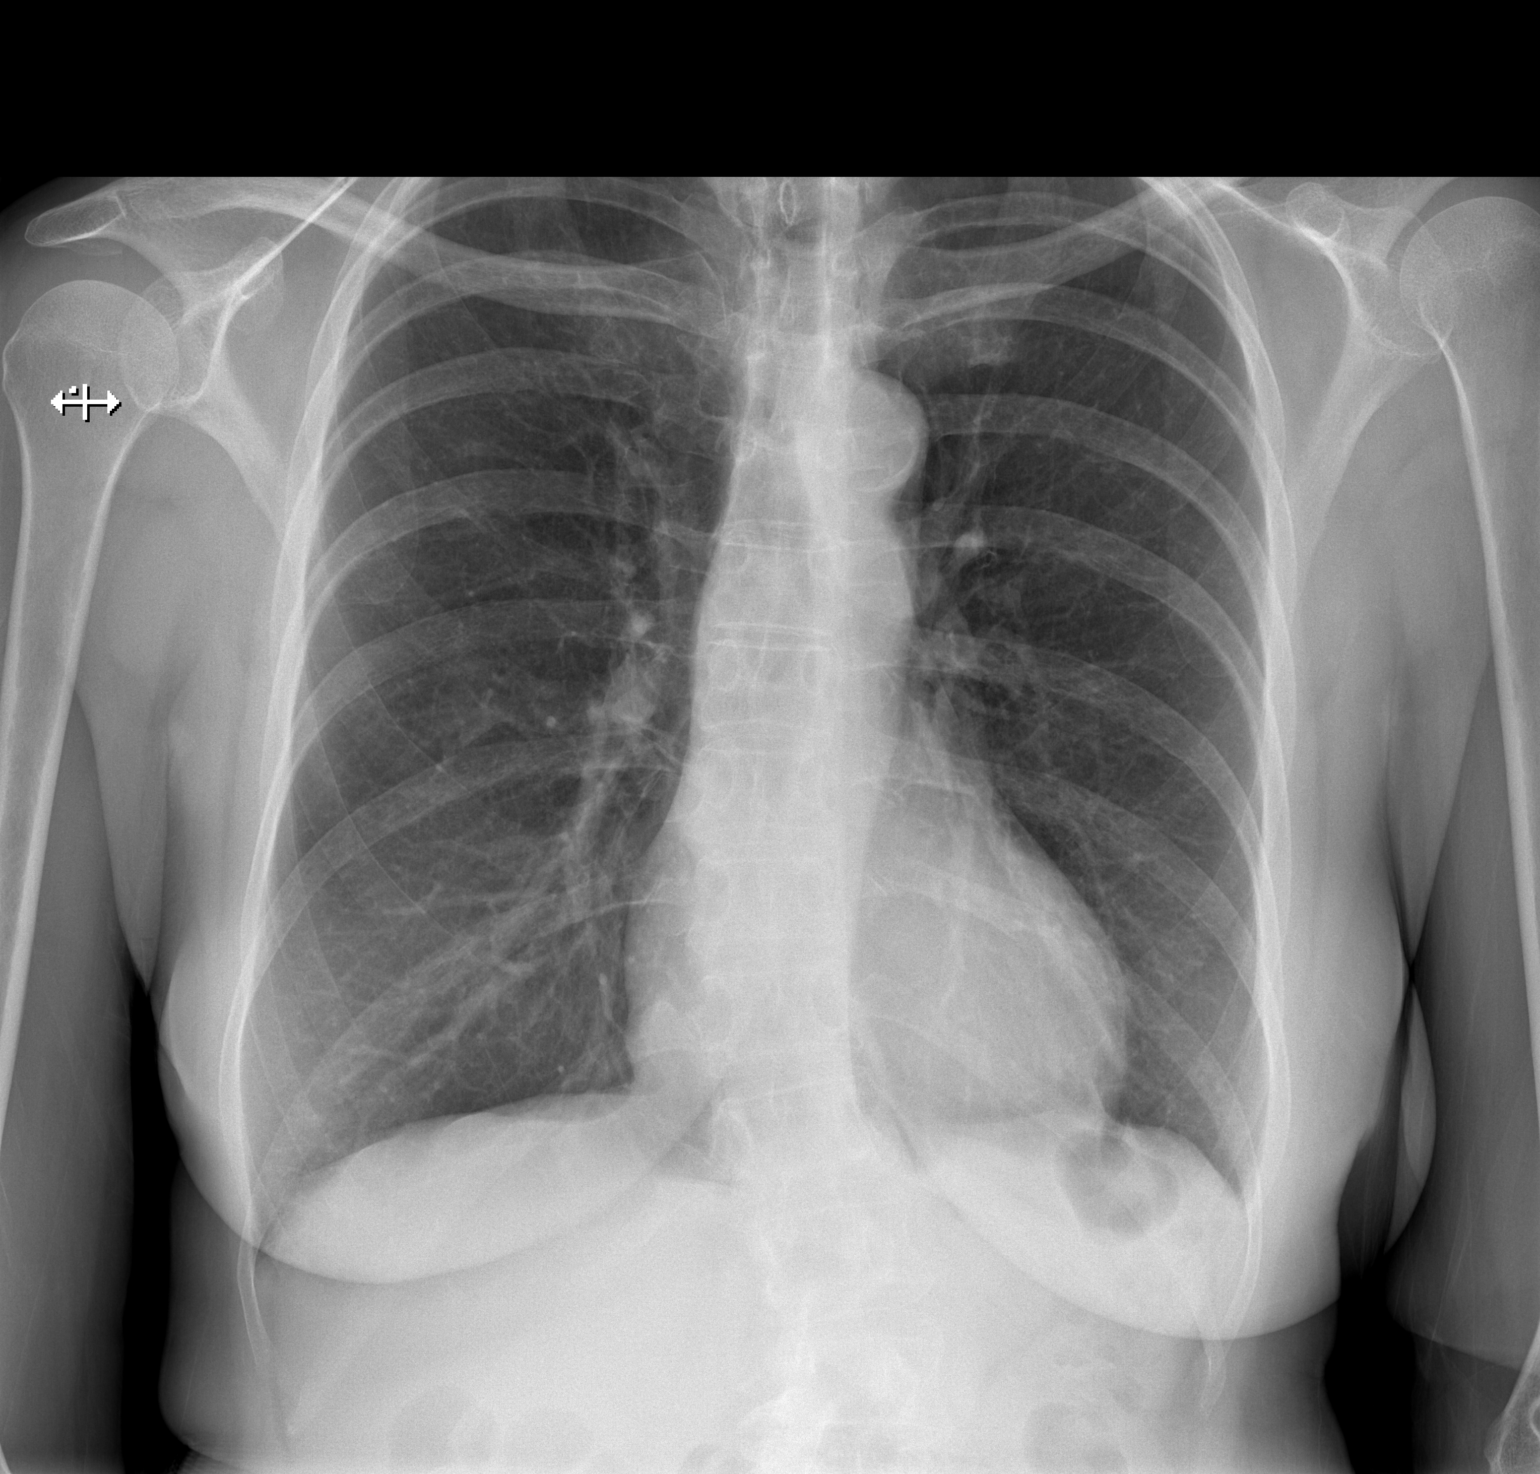

[w chest lat]
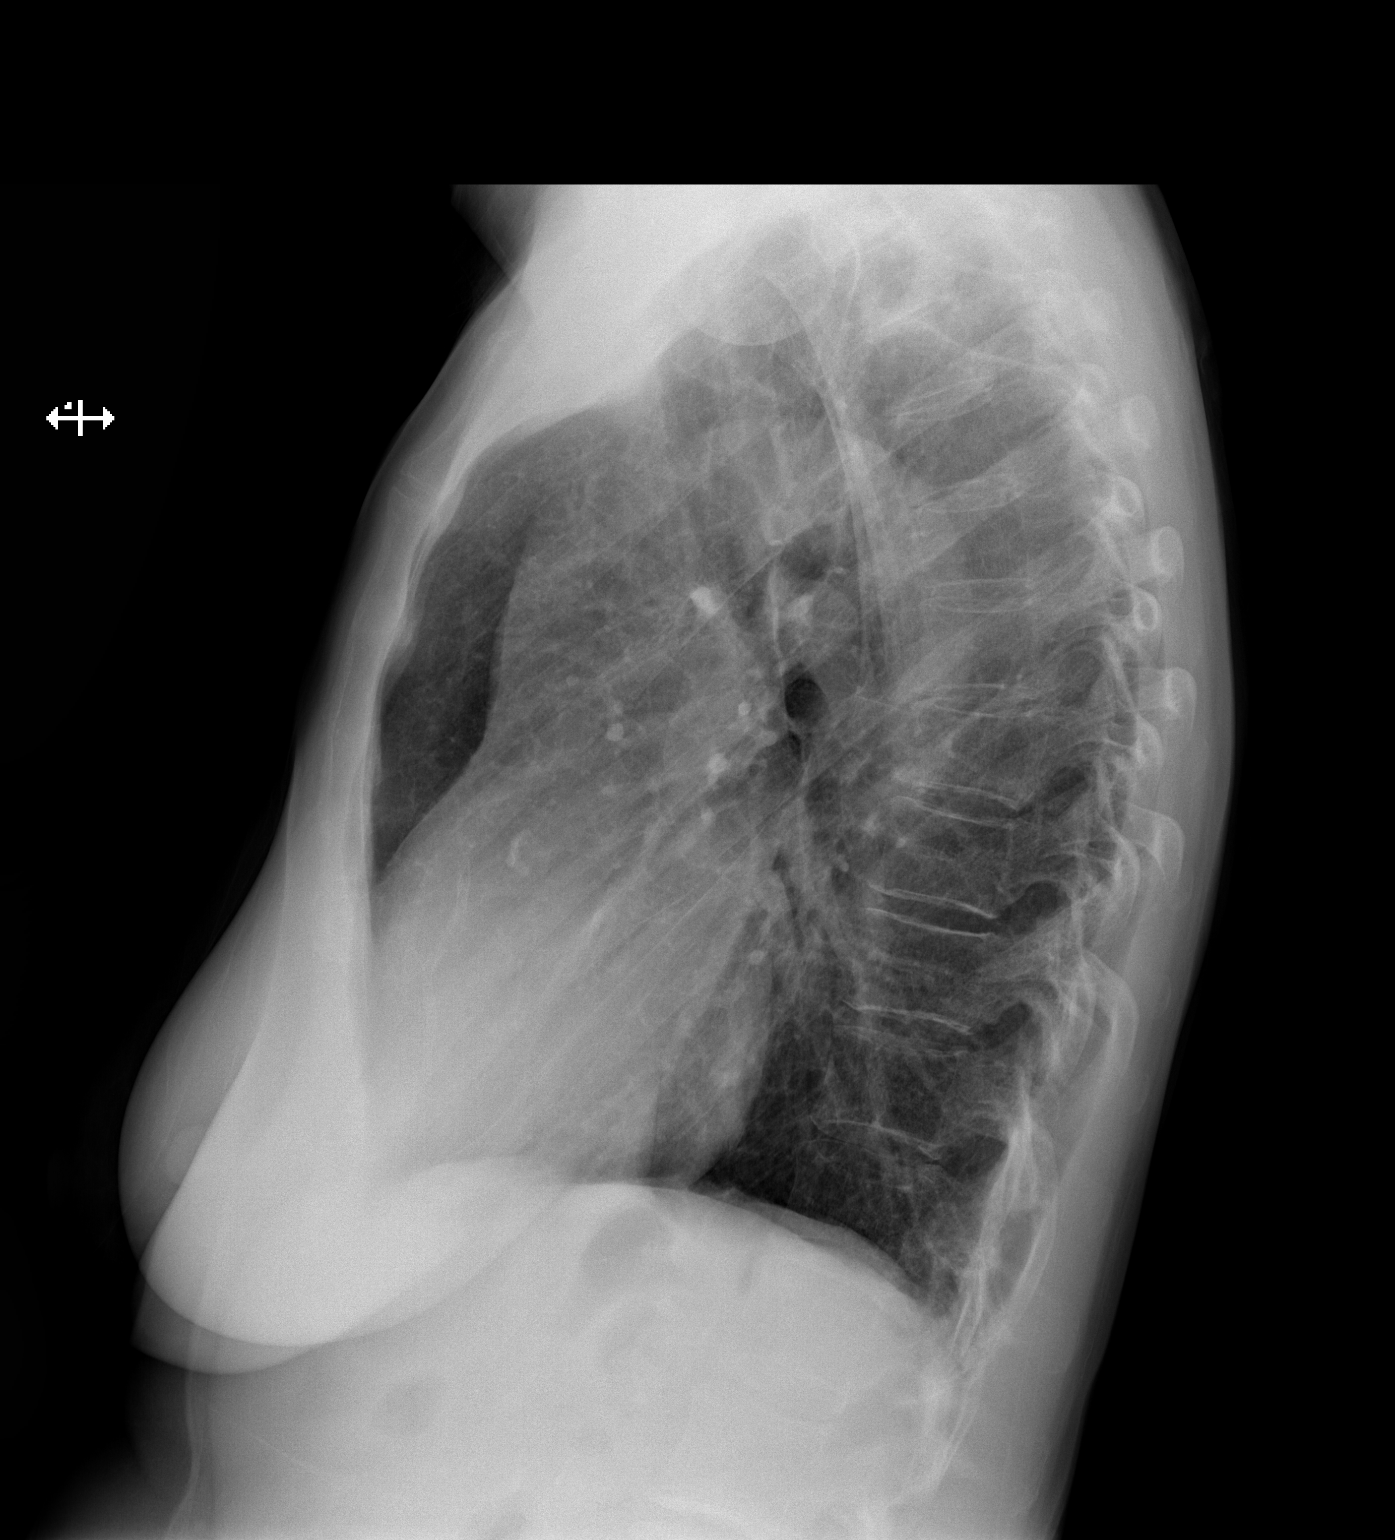

[2 of 2 positions shown; findings below may reference images not displayed]

FINDINGS: The heart size and mediastinal contours are within normal limits.
Both lungs are clear. The visualized skeletal structures are
unremarkable.
IMPRESSION: No active cardiopulmonary disease.

## 2021-05-07 NOTE — Progress Notes (Addendum)
PCP - Marnee Guarneri, NP Cardiologist - End, Harrell Gave, MD  PPM/ICD - denies Device Orders - N/A Rep Notified - N/A  Chest x-ray - 05/07/2021 EKG - 05/07/2021 Stress Test - 05/30/2020 ECHO - 10/05/2020 Cardiac Cath - 05/31/2020  Sleep Study - denies CPAP - N/A  Fasting Blood Sugar - N/A  Blood Thinner Instructions: Per Dr. Nelva Bush (note from 05/02/2021) - As outlined above, we will hold ticagrelor beginning tomorrow and begin aspirin 81 mg daily, to be continued in the perioperative period.  Once it is safe to do so, she can transition back from aspirin to ticagrelor 60 mg twice daily.  Aspirin - patient was instructed to ask MD if she will need to stop taking Aspirin before surgery.  Aspirin Instructions: Patient was instructed: As of today, STOP taking any Aspirin (unless otherwise instructed by your surgeon) Aleve, Naproxen, Ibuprofen, Motrin, Advil, Goody's, BC's, all herbal medications, fish oil, and all vitamins.  ERAS Protcol - no  COVID TEST- 05/07/2021 in PAT   Anesthesia review: yes - cardiac history  Patient denies shortness of breath, fever, cough and chest pain at PAT appointment   All instructions explained to the patient, with a verbal understanding of the material. Patient agrees to go over the instructions while at home for a better understanding. Patient also instructed to self quarantine after being tested for COVID-19. The opportunity to ask questions was provided.

## 2021-05-08 NOTE — Progress Notes (Signed)
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  Last seen by Dr. Saunders Revel 05/02/2021 and noted to be doing well from a heart standpoint that time.  Preoperative risk assessment was discussed.  Per note, "April Raymond does not have any unstable cardiac symptoms at this time.  Catheterization in 05/2020 showed mild to moderate, nonobstructive CAD with patent RCA stents.  LVEF has normalized on most recent echo in 09/2020.  I think it is reasonable for April Raymond to proceed with her lipoma resection without further cardiac testing/intervention.  As outlined above, we will hold ticagrelor beginning tomorrow and begin aspirin 81 mg daily, to be continued in the perioperative period.  Once it is safe to do so, she can transition back from aspirin to ticagrelor 60 mg twice daily."  Former smoker with history of COPD followed by pulmonology, maintained on Anoro and as needed albuterol.  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  Preop labs reviewed, mild anemia with hgb 11.6, otherwise unremarkable.   EKG 05/07/21: Normal sinus rhythm. Rate 62. Septal infarct , age undetermined  CHEST - 2 VIEW 05/07/2021: COMPARISON:  None.   FINDINGS: The heart size and mediastinal contours are within normal limits. Both lungs are clear. The visualized skeletal structures are unremarkable.   IMPRESSION: No active cardiopulmonary disease.   10/17/2020 PFTs: FEV1 1.71 L or 62% predicted, FVC 2.73 L or 76% predicted, FEV1/FVC 62%, no bronchodilator response.  Diffusion capacity mildly reduced.  Consistent with moderate COPD.  TTE 10/05/20:  1. A small to moderate sized pericardial effusion is present around the  RV free wall, does not appear circumferential. Trace around the LV free   wall. No hemodynamic compromise.   2. Left ventricular ejection fraction, by estimation, is 60 to 65%. The  left ventricle has normal function. The left ventricle has no regional  wall motion abnormalities. Left ventricular diastolic parameters are  indeterminate.   3. Right ventricular systolic function is normal. The right ventricular  size is normal. Tricuspid regurgitation signal is inadequate for assessing  PA pressure.   4. The mitral valve is normal in structure. Mild mitral valve  regurgitation.   Cath 05/31/20: Conclusions: Mild to moderate, nonobstructive coronary artery disease including 40% mid LAD stenosis and 30 to 40% ostial RCA lesion. Widely patent overlapping stents extending from the proximal through distal RCA without significant in-stent restenosis. Severely reduced left ventricular systolic function with akinesis of the mid and apical segments in a pattern consistent with stress-induced (Takotsubo) cardiomyopathy. Moderately elevated left ventricular filling pressure (LVEDP 25-30 mmHg).   Recommendations: Discontinue IV fluids, IV nitroglycerin, and IV heparin. Initiate gentle diuresis with furosemide 20 mg IV twice daily, for target net negative fluid balance of 2 L in 24 hours. Switch metoprolol to carvedilol 3.125 mg twice daily. Discontinue amlodipine and start lisinopril 2.5 mg daily, to be escalated as blood pressure and renal function allow. Continue secondary prevention of coronary artery disease, including indefinite antiplatelet therapy with ticagrelor 60 mg twice daily. Patient will likely need at least 1-2 more days of diuresis and optimization of goal-directed medical therapy for acute HFrEF secondary to stress-induced cardiomyopathy.  Wynonia Musty [facility]/Anesthesiology Phone [phone] 05/08/2021 9:59 AM

## 2021-05-09 ENCOUNTER — Ambulatory Visit (HOSPITAL_COMMUNITY): Payer: BC Managed Care – PPO | Admitting: Anesthesiology

## 2021-05-09 ENCOUNTER — Other Ambulatory Visit: Payer: Self-pay

## 2021-05-09 ENCOUNTER — Encounter (HOSPITAL_COMMUNITY): Payer: Self-pay | Admitting: Thoracic Surgery (Cardiothoracic Vascular Surgery)

## 2021-05-09 ENCOUNTER — Ambulatory Visit (HOSPITAL_COMMUNITY): Payer: BC Managed Care – PPO | Admitting: Vascular Surgery

## 2021-05-09 ENCOUNTER — Encounter (HOSPITAL_COMMUNITY)
Admission: RE | Disposition: A | Discharge status: Home / Self Care | Payer: Self-pay | Source: Home / Self Care | Attending: Thoracic Surgery (Cardiothoracic Vascular Surgery)

## 2021-05-09 ENCOUNTER — Observation Stay (HOSPITAL_COMMUNITY)
Admission: RE | Admit: 2021-05-09 | Discharge: 2021-05-10 | Disposition: A | Discharge status: Home / Self Care | Payer: BC Managed Care – PPO | Attending: Thoracic Surgery (Cardiothoracic Vascular Surgery) | Admitting: Thoracic Surgery (Cardiothoracic Vascular Surgery)

## 2021-05-09 ENCOUNTER — Inpatient Hospital Stay (HOSPITAL_COMMUNITY): Payer: BC Managed Care – PPO

## 2021-05-09 DIAGNOSIS — D1779 Benign lipomatous neoplasm of other sites: Secondary | ICD-10-CM

## 2021-05-09 DIAGNOSIS — J449 Chronic obstructive pulmonary disease, unspecified: Secondary | ICD-10-CM | POA: Diagnosis not present

## 2021-05-09 DIAGNOSIS — I10 Essential (primary) hypertension: Secondary | ICD-10-CM | POA: Insufficient documentation

## 2021-05-09 DIAGNOSIS — E785 Hyperlipidemia, unspecified: Secondary | ICD-10-CM | POA: Insufficient documentation

## 2021-05-09 DIAGNOSIS — R222 Localized swelling, mass and lump, trunk: Secondary | ICD-10-CM

## 2021-05-09 DIAGNOSIS — I739 Peripheral vascular disease, unspecified: Secondary | ICD-10-CM | POA: Insufficient documentation

## 2021-05-09 DIAGNOSIS — I2510 Atherosclerotic heart disease of native coronary artery without angina pectoris: Secondary | ICD-10-CM | POA: Insufficient documentation

## 2021-05-09 DIAGNOSIS — Z79899 Other long term (current) drug therapy: Secondary | ICD-10-CM | POA: Insufficient documentation

## 2021-05-09 DIAGNOSIS — Z86018 Personal history of other benign neoplasm: Secondary | ICD-10-CM | POA: Diagnosis present

## 2021-05-09 DIAGNOSIS — D171 Benign lipomatous neoplasm of skin and subcutaneous tissue of trunk: Secondary | ICD-10-CM | POA: Diagnosis not present

## 2021-05-09 HISTORY — PX: CHEST WALL RECONSTRUCTION: SHX5103

## 2021-05-09 LAB — ABO/RH: ABO/RH(D): A POS

## 2021-05-09 IMAGING — DX DG CHEST 1V PORT
1 series · 1 of 1 positions shown · non-contrast
Comparison: [DATE].

CLINICAL DATA: Status post resection of right chest wall lipoma.

EXAM:
PORTABLE CHEST 1 VIEW

[chest]
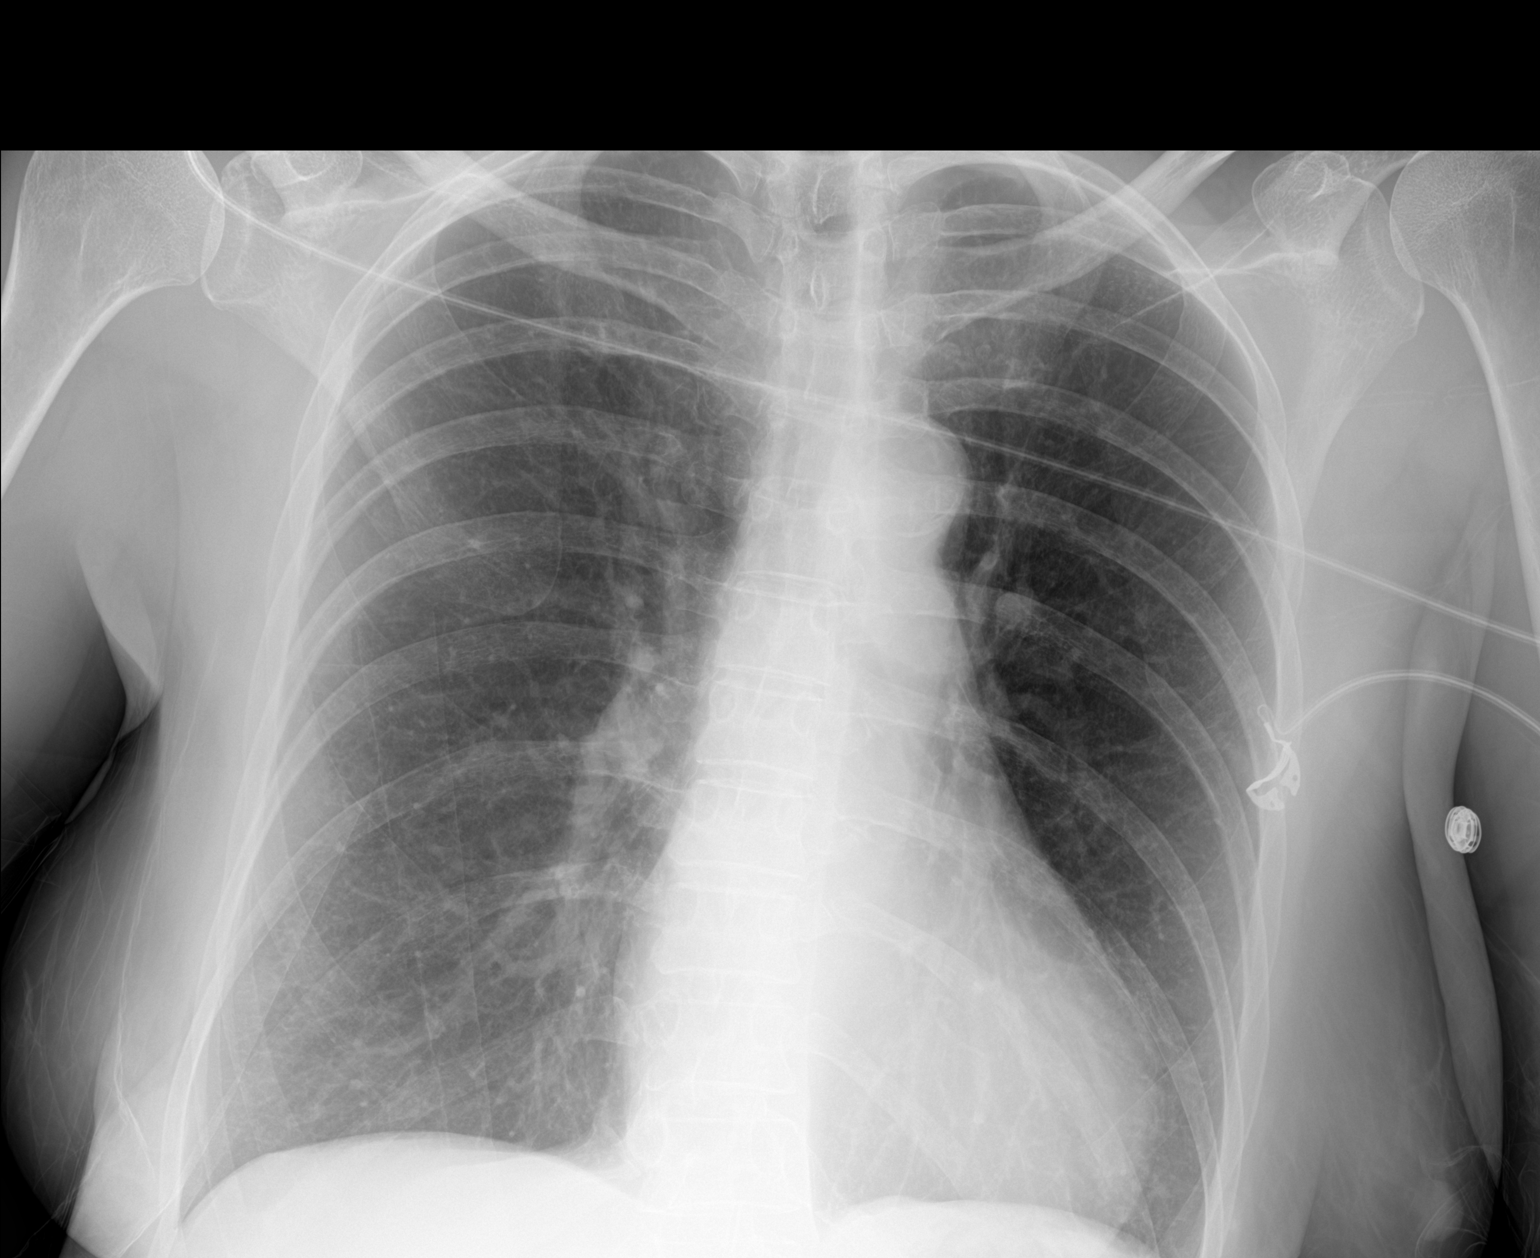

[1 of 1 positions shown; findings below may reference images not displayed]

FINDINGS: The heart size and mediastinal contours are within normal limits.
Both lungs are clear. No pneumothorax or pleural effusion is noted.
The visualized skeletal structures are unremarkable.
IMPRESSION: No active disease.

## 2021-05-09 SURGERY — RECONSTRUCTION, MAJOR, CHEST WALL
Anesthesia: General | Laterality: Right

## 2021-05-09 MED ORDER — ORAL CARE MOUTH RINSE: 15.0000 mL | Freq: Once | OROMUCOSAL | Status: AC

## 2021-05-09 MED ORDER — PHENYLEPHRINE HCL-NACL 20-0.9 MG/250ML-% IV SOLN
INTRAVENOUS | Status: DC | PRN
  Administered 2021-05-09: 25 ug/min via INTRAVENOUS

## 2021-05-09 MED ORDER — PROPOFOL 10 MG/ML IV BOLUS
INTRAVENOUS | Status: DC | PRN
  Administered 2021-05-09: 100 mg via INTRAVENOUS

## 2021-05-09 MED ORDER — ONDANSETRON HCL 4 MG/2ML IJ SOLN
INTRAMUSCULAR | Status: AC
  Filled 2021-05-09: qty 2

## 2021-05-09 MED ORDER — BUPIVACAINE HCL 0.5 % IJ SOLN
INTRAMUSCULAR | Status: AC
  Filled 2021-05-09: qty 1

## 2021-05-09 MED ORDER — TRAMADOL HCL 50 MG PO TABS: 50.0000 mg | ORAL_TABLET | Freq: Four times a day (QID) | ORAL | Status: DC | PRN

## 2021-05-09 MED ORDER — MIDAZOLAM HCL 2 MG/2ML IJ SOLN: 0.5000 mg | Freq: Once | INTRAMUSCULAR | Status: DC | PRN

## 2021-05-09 MED ORDER — ASPIRIN EC 81 MG PO TBEC
81.0000 mg | DELAYED_RELEASE_TABLET | Freq: Every day | ORAL | Status: DC
  Administered 2021-05-10: 81 mg via ORAL
  Filled 2021-05-09: qty 1

## 2021-05-09 MED ORDER — DIPHENHYDRAMINE HCL 50 MG/ML IJ SOLN
INTRAMUSCULAR | Status: DC | PRN
  Administered 2021-05-09: 12.5 mg via INTRAVENOUS

## 2021-05-09 MED ORDER — FUROSEMIDE 40 MG PO TABS
40.0000 mg | ORAL_TABLET | Freq: Every day | ORAL | Status: DC
  Administered 2021-05-10: 40 mg via ORAL
  Filled 2021-05-09: qty 1

## 2021-05-09 MED ORDER — MIDAZOLAM HCL 2 MG/2ML IJ SOLN
INTRAMUSCULAR | Status: DC | PRN
  Administered 2021-05-09: 2 mg via INTRAVENOUS

## 2021-05-09 MED ORDER — ONDANSETRON HCL 4 MG/2ML IJ SOLN
INTRAMUSCULAR | Status: DC | PRN
  Administered 2021-05-09: 4 mg via INTRAVENOUS

## 2021-05-09 MED ORDER — ACETAMINOPHEN 500 MG PO TABS: 1000.0000 mg | ORAL_TABLET | Freq: Once | ORAL | Status: AC

## 2021-05-09 MED ORDER — HYDROMORPHONE HCL 1 MG/ML IJ SOLN
0.2500 mg | INTRAMUSCULAR | Status: DC | PRN
  Administered 2021-05-09 (×2): 0.5 mg via INTRAVENOUS

## 2021-05-09 MED ORDER — 0.9 % SODIUM CHLORIDE (POUR BTL) OPTIME
TOPICAL | Status: DC | PRN
  Administered 2021-05-09: 2000 mL

## 2021-05-09 MED ORDER — LACTATED RINGERS IV SOLN: INTRAVENOUS | Status: DC | PRN

## 2021-05-09 MED ORDER — DEXAMETHASONE SODIUM PHOSPHATE 10 MG/ML IJ SOLN
INTRAMUSCULAR | Status: AC
  Filled 2021-05-09: qty 1

## 2021-05-09 MED ORDER — BUPIVACAINE LIPOSOME 1.3 % IJ SUSP
INTRAMUSCULAR | Status: AC
  Filled 2021-05-09: qty 20

## 2021-05-09 MED ORDER — LIDOCAINE 2% (20 MG/ML) 5 ML SYRINGE
INTRAMUSCULAR | Status: DC | PRN
Start: 2021-05-09 — End: 2021-05-09
  Administered 2021-05-09: 20 mg via INTRAVENOUS

## 2021-05-09 MED ORDER — LACTATED RINGERS IV SOLN: INTRAVENOUS | Status: DC

## 2021-05-09 MED ORDER — LIDOCAINE 2% (20 MG/ML) 5 ML SYRINGE
INTRAMUSCULAR | Status: AC
  Filled 2021-05-09: qty 5

## 2021-05-09 MED ORDER — CEFAZOLIN SODIUM-DEXTROSE 2-4 GM/100ML-% IV SOLN
2.0000 g | Freq: Three times a day (TID) | INTRAVENOUS | Status: AC
  Administered 2021-05-09 (×2): 2 g via INTRAVENOUS
  Filled 2021-05-09 (×2): qty 100

## 2021-05-09 MED ORDER — CHLORHEXIDINE GLUCONATE 0.12 % MT SOLN: 15.0000 mL | Freq: Once | OROMUCOSAL | Status: AC

## 2021-05-09 MED ORDER — FENTANYL CITRATE (PF) 100 MCG/2ML IJ SOLN
INTRAMUSCULAR | Status: DC | PRN
  Administered 2021-05-09: 250 ug via INTRAVENOUS

## 2021-05-09 MED ORDER — OXYCODONE HCL 5 MG/5ML PO SOLN: 5.0000 mg | Freq: Once | ORAL | Status: DC | PRN

## 2021-05-09 MED ORDER — ACETAMINOPHEN 500 MG PO TABS
1000.0000 mg | ORAL_TABLET | Freq: Four times a day (QID) | ORAL | Status: DC
  Administered 2021-05-09 – 2021-05-10 (×3): 1000 mg via ORAL
  Filled 2021-05-09 (×3): qty 2

## 2021-05-09 MED ORDER — CHLORHEXIDINE GLUCONATE 0.12 % MT SOLN
OROMUCOSAL | Status: AC
  Administered 2021-05-09: 15 mL via OROMUCOSAL
  Filled 2021-05-09: qty 15

## 2021-05-09 MED ORDER — ACETAMINOPHEN 160 MG/5ML PO SOLN: 1000.0000 mg | Freq: Four times a day (QID) | ORAL | Status: DC

## 2021-05-09 MED ORDER — SCOPOLAMINE 1 MG/3DAYS TD PT72
MEDICATED_PATCH | TRANSDERMAL | Status: AC
  Administered 2021-05-09: 1.5 mg via TRANSDERMAL
  Filled 2021-05-09: qty 1

## 2021-05-09 MED ORDER — KETOROLAC TROMETHAMINE 30 MG/ML IJ SOLN
30.0000 mg | Freq: Four times a day (QID) | INTRAMUSCULAR | Status: DC
  Administered 2021-05-09 – 2021-05-10 (×4): 30 mg via INTRAVENOUS
  Filled 2021-05-09 (×4): qty 1

## 2021-05-09 MED ORDER — LOSARTAN POTASSIUM 25 MG PO TABS
12.5000 mg | ORAL_TABLET | Freq: Every day | ORAL | Status: DC
  Administered 2021-05-10: 12.5 mg via ORAL
  Filled 2021-05-09: qty 1

## 2021-05-09 MED ORDER — ATORVASTATIN CALCIUM 80 MG PO TABS
80.0000 mg | ORAL_TABLET | Freq: Every day | ORAL | Status: DC
  Administered 2021-05-10: 80 mg via ORAL
  Filled 2021-05-09: qty 1

## 2021-05-09 MED ORDER — METOPROLOL SUCCINATE ER 25 MG PO TB24
12.5000 mg | ORAL_TABLET | Freq: Every day | ORAL | Status: DC
  Administered 2021-05-10: 12.5 mg via ORAL
  Filled 2021-05-09: qty 1

## 2021-05-09 MED ORDER — LEVOTHYROXINE SODIUM 75 MCG PO TABS
75.0000 ug | ORAL_TABLET | ORAL | Status: DC
  Administered 2021-05-10: 75 ug via ORAL
  Filled 2021-05-09: qty 1

## 2021-05-09 MED ORDER — PANTOPRAZOLE SODIUM 40 MG PO TBEC
40.0000 mg | DELAYED_RELEASE_TABLET | Freq: Every day | ORAL | Status: DC
  Administered 2021-05-10: 40 mg via ORAL
  Filled 2021-05-09: qty 1

## 2021-05-09 MED ORDER — ALBUTEROL SULFATE (2.5 MG/3ML) 0.083% IN NEBU: 3.0000 mL | INHALATION_SOLUTION | Freq: Four times a day (QID) | RESPIRATORY_TRACT | Status: DC | PRN

## 2021-05-09 MED ORDER — HYDROMORPHONE HCL 1 MG/ML IJ SOLN
INTRAMUSCULAR | Status: AC
  Filled 2021-05-09: qty 1

## 2021-05-09 MED ORDER — PROPOFOL 10 MG/ML IV BOLUS
INTRAVENOUS | Status: AC
  Filled 2021-05-09: qty 40

## 2021-05-09 MED ORDER — BUPIVACAINE LIPOSOME 1.3 % IJ SUSP
INTRAMUSCULAR | Status: DC | PRN
  Administered 2021-05-09: 50 mL

## 2021-05-09 MED ORDER — ROCURONIUM BROMIDE 10 MG/ML (PF) SYRINGE
PREFILLED_SYRINGE | INTRAVENOUS | Status: AC
  Filled 2021-05-09: qty 10

## 2021-05-09 MED ORDER — SUGAMMADEX SODIUM 200 MG/2ML IV SOLN
INTRAVENOUS | Status: DC | PRN
  Administered 2021-05-09: 200 mg via INTRAVENOUS

## 2021-05-09 MED ORDER — BISACODYL 5 MG PO TBEC
10.0000 mg | DELAYED_RELEASE_TABLET | Freq: Every day | ORAL | Status: DC
  Filled 2021-05-09: qty 2

## 2021-05-09 MED ORDER — UMECLIDINIUM-VILANTEROL 62.5-25 MCG/INH IN AEPB
1.0000 | INHALATION_SPRAY | Freq: Every day | RESPIRATORY_TRACT | Status: DC
  Administered 2021-05-10: 1 via RESPIRATORY_TRACT
  Filled 2021-05-09: qty 14

## 2021-05-09 MED ORDER — FENTANYL CITRATE (PF) 250 MCG/5ML IJ SOLN
INTRAMUSCULAR | Status: AC
  Filled 2021-05-09: qty 5

## 2021-05-09 MED ORDER — CEFAZOLIN SODIUM-DEXTROSE 2-4 GM/100ML-% IV SOLN
2.0000 g | INTRAVENOUS | Status: AC
  Administered 2021-05-09: 2 g via INTRAVENOUS

## 2021-05-09 MED ORDER — SENNOSIDES-DOCUSATE SODIUM 8.6-50 MG PO TABS
1.0000 | ORAL_TABLET | Freq: Every day | ORAL | Status: DC
  Administered 2021-05-09: 1 via ORAL
  Filled 2021-05-09: qty 1

## 2021-05-09 MED ORDER — STERILE WATER FOR IRRIGATION IR SOLN
Status: DC | PRN
  Administered 2021-05-09: 1000 mL

## 2021-05-09 MED ORDER — ACETAMINOPHEN 500 MG PO TABS
ORAL_TABLET | ORAL | Status: AC
  Administered 2021-05-09: 1000 mg via ORAL
  Filled 2021-05-09: qty 2

## 2021-05-09 MED ORDER — ROCURONIUM BROMIDE 10 MG/ML (PF) SYRINGE
PREFILLED_SYRINGE | INTRAVENOUS | Status: DC | PRN
  Administered 2021-05-09: 60 mg via INTRAVENOUS
  Administered 2021-05-09: 20 mg via INTRAVENOUS

## 2021-05-09 MED ORDER — DEXAMETHASONE SODIUM PHOSPHATE 10 MG/ML IJ SOLN
INTRAMUSCULAR | Status: DC | PRN
  Administered 2021-05-09: 5 mg via INTRAVENOUS

## 2021-05-09 MED ORDER — CEFAZOLIN SODIUM-DEXTROSE 2-4 GM/100ML-% IV SOLN
INTRAVENOUS | Status: AC
  Filled 2021-05-09: qty 100

## 2021-05-09 MED ORDER — PROMETHAZINE HCL 25 MG/ML IJ SOLN: 6.2500 mg | INTRAMUSCULAR | Status: DC | PRN

## 2021-05-09 MED ORDER — SCOPOLAMINE 1 MG/3DAYS TD PT72: 1.0000 | MEDICATED_PATCH | TRANSDERMAL | Status: DC

## 2021-05-09 MED ORDER — ONDANSETRON HCL 4 MG/2ML IJ SOLN: 4.0000 mg | Freq: Four times a day (QID) | INTRAMUSCULAR | Status: DC | PRN

## 2021-05-09 MED ORDER — PHENYLEPHRINE 40 MCG/ML (10ML) SYRINGE FOR IV PUSH (FOR BLOOD PRESSURE SUPPORT)
PREFILLED_SYRINGE | INTRAVENOUS | Status: DC | PRN
  Administered 2021-05-09 (×2): 120 ug via INTRAVENOUS

## 2021-05-09 MED ORDER — MIDAZOLAM HCL 2 MG/2ML IJ SOLN
INTRAMUSCULAR | Status: AC
  Filled 2021-05-09: qty 2

## 2021-05-09 MED ORDER — OXYCODONE HCL 5 MG PO TABS: 5.0000 mg | ORAL_TABLET | Freq: Once | ORAL | Status: DC | PRN

## 2021-05-09 SURGICAL SUPPLY — 77 items
BLADE CLIPPER SURG (BLADE) IMPLANT
CANISTER SUCT 3000ML PPV (MISCELLANEOUS) ×2 IMPLANT
CATH KIT ON-Q SILVERSOAK 5IN (CATHETERS) IMPLANT
CATH THORACIC 28FR (CATHETERS) IMPLANT
CATH THORACIC 36FR (CATHETERS) IMPLANT
CATH THORACIC 36FR RT ANG (CATHETERS) IMPLANT
CHLORAPREP W/TINT 26 (MISCELLANEOUS) ×2 IMPLANT
CLEANER TIP ELECTROSURG 2X2 (MISCELLANEOUS) ×2 IMPLANT
CLIP VESOCCLUDE MED 6/CT (CLIP) IMPLANT
CNTNR URN SCR LID CUP LEK RST (MISCELLANEOUS) ×2 IMPLANT
CONN ST 1/4X3/8  BEN (MISCELLANEOUS)
CONN ST 1/4X3/8 BEN (MISCELLANEOUS) IMPLANT
CONN Y 3/8X3/8X3/8  BEN (MISCELLANEOUS)
CONN Y 3/8X3/8X3/8 BEN (MISCELLANEOUS) IMPLANT
CONNECTOR BLAKE 2:1 CARIO BLK (MISCELLANEOUS) IMPLANT
CONT SPEC 4OZ STRL OR WHT (MISCELLANEOUS) ×2
DERMABOND ADVANCED (GAUZE/BANDAGES/DRESSINGS) ×1
DERMABOND ADVANCED .7 DNX12 (GAUZE/BANDAGES/DRESSINGS) ×1 IMPLANT
DISSECTOR BLUNT TIP ENDO 5MM (MISCELLANEOUS) IMPLANT
DRAIN CHANNEL 19F RND (DRAIN) IMPLANT
DRAIN CHANNEL 28F RND 3/8 FF (WOUND CARE) IMPLANT
DRAPE WARM FLUID 44X44 (DRAPES) ×2 IMPLANT
ELECT BLADE 4.0 EZ CLEAN MEGAD (MISCELLANEOUS) ×2
ELECT REM PT RETURN 9FT ADLT (ELECTROSURGICAL) ×2
ELECTRODE BLDE 4.0 EZ CLN MEGD (MISCELLANEOUS) ×1 IMPLANT
ELECTRODE REM PT RTRN 9FT ADLT (ELECTROSURGICAL) ×1 IMPLANT
GAUZE SPONGE 4X4 12PLY STRL (GAUZE/BANDAGES/DRESSINGS) ×2 IMPLANT
GLOVE SURG ENC TEXT LTX SZ7 (GLOVE) ×2 IMPLANT
GLOVE SURG ENC TEXT LTX SZ7.5 (GLOVE) ×2 IMPLANT
GLOVE SURG SYN 7.5  E (GLOVE) ×1
GLOVE SURG SYN 7.5 E (GLOVE) ×1 IMPLANT
GLOVE SURG UNDER POLY LF SZ6 (GLOVE) ×4 IMPLANT
GLOVE SURG UNDER POLY LF SZ6.5 (GLOVE) ×2 IMPLANT
GOWN STRL REUS W/ TWL LRG LVL3 (GOWN DISPOSABLE) ×3 IMPLANT
GOWN STRL REUS W/ TWL XL LVL3 (GOWN DISPOSABLE) ×2 IMPLANT
GOWN STRL REUS W/TWL LRG LVL3 (GOWN DISPOSABLE) ×3
GOWN STRL REUS W/TWL XL LVL3 (GOWN DISPOSABLE) ×2
KIT BASIN OR (CUSTOM PROCEDURE TRAY) ×2 IMPLANT
KIT SUCTION CATH 14FR (SUCTIONS) IMPLANT
KIT TURNOVER KIT B (KITS) ×2 IMPLANT
NS IRRIG 1000ML POUR BTL (IV SOLUTION) ×4 IMPLANT
PACK CHEST (CUSTOM PROCEDURE TRAY) ×2 IMPLANT
PACK UNIVERSAL I (CUSTOM PROCEDURE TRAY) ×2 IMPLANT
PAD ARMBOARD 7.5X6 YLW CONV (MISCELLANEOUS) ×4 IMPLANT
SEALANT SURG COSEAL 8ML (VASCULAR PRODUCTS) IMPLANT
SUT PROLENE 3 0 SH DA (SUTURE) IMPLANT
SUT PROLENE 4 0 RB 1 (SUTURE)
SUT PROLENE 4-0 RB1 .5 CRCL 36 (SUTURE) IMPLANT
SUT SILK  1 MH (SUTURE) ×2
SUT SILK 1 MH (SUTURE) ×2 IMPLANT
SUT SILK 1 TIES 10X30 (SUTURE) IMPLANT
SUT SILK 2 0SH CR/8 30 (SUTURE) IMPLANT
SUT SILK 3 0SH CR/8 30 (SUTURE) IMPLANT
SUT VIC AB 1 CTX 18 (SUTURE) IMPLANT
SUT VIC AB 1 CTX 36 (SUTURE)
SUT VIC AB 1 CTX36XBRD ANBCTR (SUTURE) IMPLANT
SUT VIC AB 2-0 CTX 36 (SUTURE) IMPLANT
SUT VIC AB 2-0 SH 27 (SUTURE) ×1
SUT VIC AB 2-0 SH 27XBRD (SUTURE) ×1 IMPLANT
SUT VIC AB 3-0 SH 27 (SUTURE) ×1
SUT VIC AB 3-0 SH 27XBRD (SUTURE) ×1 IMPLANT
SUT VIC AB 3-0 X1 27 (SUTURE) IMPLANT
SUT VICRYL 0 UR6 27IN ABS (SUTURE) IMPLANT
SUT VICRYL 2 TP 1 (SUTURE) IMPLANT
SWAB COLLECTION DEVICE MRSA (MISCELLANEOUS) IMPLANT
SWAB CULTURE ESWAB REG 1ML (MISCELLANEOUS) IMPLANT
SYR CONTROL 10ML LL (SYRINGE) ×2 IMPLANT
SYSTEM SAHARA CHEST DRAIN ATS (WOUND CARE) ×2 IMPLANT
TAPE CLOTH 4X10 WHT NS (GAUZE/BANDAGES/DRESSINGS) IMPLANT
TIP APPLICATOR SPRAY EXTEND 16 (VASCULAR PRODUCTS) IMPLANT
TOWEL GREEN STERILE (TOWEL DISPOSABLE) ×2 IMPLANT
TOWEL GREEN STERILE FF (TOWEL DISPOSABLE) ×2 IMPLANT
TRAP SPECIMEN MUCUS 40CC (MISCELLANEOUS) IMPLANT
TRAY FOLEY MTR SLVR 14FR STAT (SET/KITS/TRAYS/PACK) ×2 IMPLANT
TRAY FOLEY SLVR 16FR LF STAT (SET/KITS/TRAYS/PACK) IMPLANT
TROCAR XCEL 12X100 BLDLESS (ENDOMECHANICALS) IMPLANT
WATER STERILE IRR 1000ML POUR (IV SOLUTION) ×2 IMPLANT

## 2021-05-09 NOTE — Op Note (Signed)
     [address]       [place] [address]             [phone]       05/09/2021 Patient:  Hoyle Sauer A Borkowski Pre-Op Dx: Posterior chest wall mass Post-op Dx: Posterior chest wall lipoma. Procedure: -Resection of posterior chest wall lipoma - Intercostal nerve block  Surgeon and Role:      * Nahal Wanless, Lucile Crater, MD - Primary    *M. Roddenberry, PA-C- assisting   Anesthesia  general EBL: 5 ml Blood Administration: None Specimen: Chest wall mass   Counts: correct    Indications: 60 year old female with a lipoma along her right back.  It is palpable today.  The MRI was reviewed and there appears to be a portion of the lipoma that is protruding through her ribs.  She would like to proceed with surgery.  She is currently on Brilinta.  This will need to be held prior to surgery.  I will touch base with her cardiologist Dr. Saunders Revel to assure that she can come off of this without heparin bridge.  She also has a history of Takotsubo cardiomyopathy, but she should do well with surgery.  She will require a chest wall resection hopefully we do not need to resect any ribs.  If we do we will reconstruct the area with mesh given its proximity to the scapula.  She will need to be in a fully prone position during this operation, and I will touch base with anesthesia to assure appropriate planning.  Findings: Soft tissue mass was noted below the level of the scapula.  It was soft and mobile.  He did have a component that extended between the ribs.  There was no pleural invasion.  Operative Technique: After the risks, benefits and alternatives were thoroughly discussed, the patient was brought to the operative theatre.  Anesthesia was induced, the patient was then placed in a prone position and was prepped and draped in normal sterile fashion.  An appropriate surgical pause was performed, and pre-operative antibiotics were dosed accordingly.  We made a 5 cm incision along the mass.   Tissue flaps were elevated using a combination of Bovie cautery and blunt dissection.  The mass was then encircled and cored out using Bovie cautery.  I did extend out between the ribs but it was able to be elevated off of the pleural surface.  There is no evidence of any pleural injury.  Of the mass was then removed.    Exparel was then injected in an intercostal fashion and along the incision edges.  The wound was then closed with several layers of absorbable suture.  The patient tolerated the procedure without any immediate complications, and was transferred to the PACU in stable condition.  Mylez Venable Bary Leriche

## 2021-05-09 NOTE — Transfer of Care (Signed)
Immediate Anesthesia Transfer of Care Note  Patient: April Raymond  Procedure(s) Performed: CHEST WALL MASS RESECTION (Right)  Patient Location: PACU  Anesthesia Type:General  Level of Consciousness: drowsy  Airway & Oxygen Therapy: Patient Spontanous Breathing and Patient connected to face mask oxygen  Post-op Assessment: Report given to RN and Post -op Vital signs reviewed and stable  Post vital signs: Reviewed and stable  Last Vitals:  Vitals Value Taken Time  BP 132/87 05/09/21 0929  Temp    Pulse 68 05/09/21 0929  Resp 20 05/09/21 0930  SpO2 100 % 05/09/21 0930  Vitals shown include unvalidated device data.  Last Pain:  Vitals:   05/09/21 0622  PainSc: 0-No pain      Patients Stated Pain Goal: 3 ([phone] [phone]  Complications: No notable events documented.

## 2021-05-09 NOTE — Anesthesia Procedure Notes (Signed)
Procedure Name: Intubation Date/Time: 05/09/2021 7:58 AM Performed by: Barrington Ellison, CRNA Pre-anesthesia Checklist: Patient identified, Emergency Drugs available, Suction available and Patient being monitored Patient Re-evaluated:Patient Re-evaluated prior to induction Oxygen Delivery Method: Circle system utilized Preoxygenation: Pre-oxygenation with 100% oxygen Induction Type: IV induction Ventilation: Mask ventilation without difficulty Laryngoscope Size: Glidescope and 3 Grade View: Grade I Tube type: Oral Tube size: 7.0 mm Number of attempts: 1 Airway Equipment and Method: Stylet Placement Confirmation: ETT inserted through vocal cords under direct vision, positive ETCO2 and breath sounds checked- equal and bilateral Secured at: 22 cm Tube secured with: Tape Dental Injury: Teeth and Oropharynx as per pre-operative assessment  Comments: Intubated by Everlene Other SRNA

## 2021-05-09 NOTE — Interval H&P Note (Signed)
History and Physical Interval Note:  05/09/2021 7:38 AM  April Raymond  has presented today for surgery, with the diagnosis of Chest Wall Mass.  The various methods of treatment have been discussed with the patient and family. After consideration of risks, benefits and other options for treatment, the patient has consented to  Procedure(s): CHEST WALL MASS RESECTION, POSSIBLE MESH RECONSTRUCTION (N/A) as a surgical intervention.  The patient's history has been reviewed, patient examined, no change in status, stable for surgery.  I have reviewed the patient's chart and labs.  Questions were answered to the patient's satisfaction.     Oanh Devivo Bary Leriche

## 2021-05-09 NOTE — Discharge Instructions (Signed)
Discharge Instructions:  1. You may shower, please wash incisions daily with soap and water and keep dry.  If you wish to cover wounds with dressing you may do so but please keep clean and change daily.  No tub baths or swimming until incisions have completely healed.  If your incisions become red or develop any drainage please call our office at [phone]  2. No Driving until you are no longer using narcotic pain medications  3. Monitor your weight daily.. Please use the same scale and weigh at same time... If you gain 5-10 lbs in 48 hours with associated lower extremity swelling, please contact our office at [phone]  4. Fever of 101.5 for at least 24 hours with no source, please contact our office at [phone]  5. Activity- up as tolerated, please walk at least 3 times per day.  Avoid strenuous activity, no lifting, pushing, or pulling with your arms over 8-10 lbs for a minimum of 6 weeks  6. If any questions or concerns arise, please do not hesitate to contact our office at [phone]

## 2021-05-09 NOTE — Discharge Summary (Signed)
Physician Discharge Summary  Patient ID: KAYDN TRUPP MRN: [id number] DOB/AGE: 1961/01/05 60 y.o.  Admit date: 05/09/2021 Discharge date: 05/10/2021  Admission Diagnoses:  Mass of chest wall, right Hypertension Hyperlipidemia Coronary artery disease Hashimoto's disease Moderate COPD Peripheral arterial disease   Discharge Diagnoses:   Mass of chest wall, right Hypertension Hyperlipidemia Coronary artery disease Hashimoto's disease Moderate COPD Peripheral arterial disease   Discharged Condition: stable PATHOLOGY: Pending  History of Present Illness:    April Raymond 60 y.o. female referred for surgical evaluation of a right back lipoma.  The patient states that she noticed the one day while looking in the mirror.  Her concern is that this may be enlarging, and that surgery may be more difficult if she allows it to grow much more.  She denies any pain or paresthesias along her back.  On exam it is difficult to palpate, but there is some prominence along her right scapular border.  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  There does not appear to be any pleural invasion.   We had a long discussion about the risks and benefits of surgery, and I explained to her that this would require surgical incision.  There is a chance that there will be some pleural injury thus she may require chest tube placement at the end of the case.  I do not think that there is any invasion of the ribs thus a chest wall resection likely will not be required, but if so this will be reconstructed with mesh.  I would like to obtain a repeat MRI to ensure that the lipoma has not changed significantly in the year.  I do not think that this can be approached robotically, but formal scan will help me delineate this.   She also has a history of coronary artery disease and Takotsubo cardiomyopathy for which she is being followed by  Dr. Saunders Revel.  He has recommended she take aspirin '81mg'$  daily while holding her Brilinta for surgery.  Following the procedure, the Brilinta is to be resumed and aspirin discontinued.    Hospital Course:   Ms. Matheney was admitted for elective surgery on 05/09/21 and taken to the OR for resection of the right posterior chest wall lipoma under general anesthesia. This was closed primarily in the OR and following the procedure she was recovered in the post-anesthesia unit. She was admitted for observation and remained stable. Diet was resumed, advanced and well tolerated. She had no difficulty regaining independent moblity. The right posterior chest incision remained soft and dry. Follow up CXR showed clear lung fields and no PTX.  She was ready for discharge to home on post-op day 1.     Consults: None  Significant Diagnostic Studies:   CLINICAL DATA:  Follow-up right posterior chest wall lipoma   EXAM: MR CHEST WITH AND WITHOUT CONTRAST   TECHNIQUE: Multiplanar, multisequence MR imaging of the right posterior chest was performed before and after the administration of intravenous contrast.   CONTRAST:  56m GADAVIST GADOBUTROL 1 MMOL/ML IV SOLN   COMPARISON:  MRI thoracic spine dated 12/02/2019. CTA chest dated 05/31/2020.   FINDINGS: 2.0 x 2.2 x 3.1 cm lobulated fatty lesion in the right posterior chest wall (series 3/image 14; series 6/image 18), previously 1.8 x 2.5 x 3.6 cm, unchanged.   When correlating with prior CT chest, this is in the right posterior 8th/9th rib interspace.  This lesion is deep to the posterior chest wall musculature (series 3/image 15) and protrudes anteriorly along the posterior pleural surface (series 3/image 13).   No non-fatty soft tissue component within the lesion. No enhancement within the lesion following contrast administration. No adjacent osseous abnormality/destruction.   IMPRESSION: 3.1 cm lobulated fatty lesion at the right posterior 8th/9th  rib interspace, unchanged, compatible with a simple lipoma on imaging.     Electronically Signed   By: Julian Hy M.D.   On: 01/16/2021 08:38  CLINICAL DATA:  60 year old female with soreness following resection of posterior chest wall lipoma.     EXAM: PORTABLE CHEST 1 VIEW    (Post-op)   COMPARISON:  Portable chest 05/09/2021 and earlier.   FINDINGS: Portable AP semi upright view at 0633 hours. Stable borderline to mild cardiomegaly. Other mediastinal contours are within normal limits. Visualized tracheal air column is within normal limits. Lung volumes are stable at the upper limits of normal to mildly hyperinflated. Allowing for portable technique the lungs are clear. No pneumothorax. No acute osseous abnormality identified. Mild scoliosis.   IMPRESSION: Borderline cardiomegaly and pulmonary hyperinflation. No acute cardiopulmonary abnormality.     Electronically Signed   By: Genevie Ann M.D.   On: 05/10/2021 06:55      Treatments:   Surgery: 05/09/2021 Patient:  Hoyle Sauer A Haskew Pre-Op Dx: Posterior chest wall mass Post-op Dx: Posterior chest wall lipoma. Procedure: -Resection of posterior chest wall lipoma - Intercostal nerve block   Surgeon and Role:      * Lightfoot, Lucile Crater, MD - Primary    *M. Vladimir Lenhoff, PA-C- assisting     Anesthesia  general EBL: 5 ml Blood Administration: None Specimen: Chest wall mass     Counts: correct       Indications: 60 year old female with a lipoma along her right back.  It is palpable today.  The MRI was reviewed and there appears to be a portion of the lipoma that is protruding through her ribs.  She would like to proceed with surgery.  She is currently on Brilinta.  This will need to be held prior to surgery.  I will touch base with her cardiologist Dr. Saunders Revel to assure that she can come off of this without heparin bridge.  She also has a history of Takotsubo cardiomyopathy, but she should do well with surgery.   She will require a chest wall resection hopefully we do not need to resect any ribs.  If we do we will reconstruct the area with mesh given its proximity to the scapula.  She will need to be in a fully prone position during this operation, and I will touch base with anesthesia to assure appropriate planning.   Findings: Soft tissue mass was noted below the level of the scapula.  It was soft and mobile.  She did have a component that extended between the ribs.  There was no pleural invasion.  Discharge Exam: Blood pressure (!) 94/57, pulse 61, temperature 99.1 F (37.3 C), temperature source Oral, resp. rate 20, height '5\' 6"'$  (1.676 m), weight 65.3 kg, last menstrual period 11/16/2007, SpO2 96 %.  EXAM: General appearance: alert, cooperative, and no distress Neurologic: intact Heart: regular rate and rhythm Lungs: clear breath sounds, no dyspnea on RA.  Wound: the right posterior chest incision is soft, well approximated and dry,     Disposition:  Discharged to home in stable condition.  Allergies as of 05/10/2021       Reactions  Humira [adalimumab] Rash   Tape Rash        Medication List     STOP taking these medications    aspirin EC 81 MG tablet       TAKE these medications    Albuterol Sulfate 108 (90 Base) MCG/ACT Aepb Commonly known as: PROAIR RESPICLICK Inhale 2 puffs into the lungs every 6 (six) hours as needed (sob/wheezing).   Anoro Ellipta 62.5-25 MCG/INH Aepb Generic drug: umeclidinium-vilanterol Inhale 1 puff into the lungs daily.   atorvastatin 80 MG tablet Commonly known as: LIPITOR Take 1 tablet (80 mg total) by mouth daily.   Brilinta 60 MG Tabs tablet Generic drug: ticagrelor TAKE 1 TABLET (60 MG TOTAL) BY MOUTH 2 (TWO) TIMES DAILY.   furosemide 20 MG tablet Commonly known as: LASIX TAKE 1 TABLET ONCE DAILY AND THE PM PILL TO BE TAKEN AS NEEDED FOR SHORTNESS OF BREATH OR SWELLING   levothyroxine 75 MCG tablet Commonly known as:  SYNTHROID TAKE 1 TABLET (75 MCG TOTAL) BY MOUTH DAILY BEFORE BREAKFAST. What changed: See the new instructions.   losartan 25 MG tablet Commonly known as: COZAAR Take 0.5 tablets (12.5 mg total) by mouth daily.   metoprolol succinate 25 MG 24 hr tablet Commonly known as: TOPROL-XL TAKE 1/2 TABLET BY MOUTH EVERY DAY   nitroGLYCERIN 0.4 MG SL tablet Commonly known as: NITROSTAT Place 1 tablet (0.4 mg total) under the tongue every 5 (five) minutes as needed for chest pain.   oxyCODONE-acetaminophen 5-325 MG tablet Commonly known as: Percocet Take 1 tablet by mouth every 4 (four) hours as needed for up to 3 days for severe pain.   pantoprazole 40 MG tablet Commonly known as: PROTONIX TAKE 1 TABLET BY MOUTH EVERY DAY   Premarin vaginal cream Generic drug: conjugated estrogens Place 0.5 grams twice weekly intravaginally.        Follow-up Information     [name], MD. Go on 05/18/2021.   Specialty: Cardiothoracic Surgery Why: Your appointment with Dr. Kipp Brood is at 10:10am. Contact information: [address] [place] [address] [phone]                 Signed: Antony Odea, PA-C 05/10/2021, 8:22 AM

## 2021-05-09 NOTE — Plan of Care (Signed)

## 2021-05-09 NOTE — Anesthesia Procedure Notes (Signed)
Arterial Line Insertion Start/End8/24/2022 7:11 AM, 05/09/2021 7:21 AM Performed by: Annye Asa, MD, Barrington Ellison, CRNA, anesthesiologist  Patient location: Pre-op. Preanesthetic checklist: patient identified, IV checked, risks and benefits discussed, surgical consent, monitors and equipment checked, pre-op evaluation, timeout performed and anesthesia consent Lidocaine 1% used for infiltration Right, radial was placed Catheter size: 20 G Hand hygiene performed , maximum sterile barriers used  and Seldinger technique used Allen's test indicative of satisfactory collateral circulation Attempts: 2 (after CRNA attempts) Procedure performed using ultrasound guided technique. Ultrasound Notes:anatomy identified, needle tip was noted to be adjacent to the nerve/plexus identified and no ultrasound evidence of intravascular and/or intraneural injection Following insertion, dressing applied and Biopatch. Post procedure assessment: normal  Post procedure complications: second provider assisted. Patient tolerated the procedure well with no immediate complications.

## 2021-05-09 NOTE — Anesthesia Postprocedure Evaluation (Signed)
Anesthesia Post Note  Patient: April Raymond  Procedure(s) Performed: CHEST WALL MASS RESECTION (Right)     Patient location during evaluation: PACU Anesthesia Type: General Level of consciousness: awake and alert, patient cooperative and oriented Pain management: pain level controlled Vital Signs Assessment: post-procedure vital signs reviewed and stable Respiratory status: spontaneous breathing, nonlabored ventilation and respiratory function stable Cardiovascular status: blood pressure returned to baseline and stable Postop Assessment: no apparent nausea or vomiting Anesthetic complications: no   No notable events documented.  Last Vitals:  Vitals:   05/09/21 1000 05/09/21 1015  BP: 130/61 138/65  Pulse: 70 63  Resp: 20 10  Temp:    SpO2: 100% 100%    Last Pain:  Vitals:   05/09/21 1015  PainSc: Asleep                 Edla Para,E. Kayliee Atienza

## 2021-05-09 NOTE — Brief Op Note (Signed)
05/09/2021  8:54 AM  PATIENT:  Raynaldo Opitz  60 y.o. female  PRE-OPERATIVE DIAGNOSIS:  Chest Wall Mass  POST-OPERATIVE DIAGNOSIS:  Right Posterior Chest Wall Lipoma  PROCEDURE:  RESECTION LIPOMA RIGHT POSTERIOR CHEST WALL    SURGEON:  [name], MD - Primary  PHYSICIAN ASSISTANT: Caraline Deutschman  ASSISTANTS: Sherrett, Madelynn Done, Scrub Person   ANESTHESIA:   general  EBL:  <30m   BLOOD ADMINISTERED:none  DRAINS: none   LOCAL MEDICATIONS USED:  OTHER Exparel local  SPECIMEN:  Source of Specimen:  Right posterior chest wall mass  DISPOSITION OF SPECIMEN:  PATHOLOGY  COUNTS:  YES  DICTATION: .Dragon Dictation  PLAN OF CARE: Admit to inpatient   PATIENT DISPOSITION:  PACU - hemodynamically stable.   Delay start of Pharmacological VTE agent (>24hrs) due to surgical blood loss or risk of bleeding: yes

## 2021-05-09 NOTE — Hospital Course (Signed)
Hospital Course:   April Raymond was admitted for elective surgery on 05/09/21 and taken to the OR for resection of the right posterior chest wall lipoma under general anesthesia. This was closed primarily in the OR and following the procedure she was recovered in the post-anesthesia unit. She was admitted for observation and remained stable. Diet was resumed and advanced and well tolerated. She had no difficulty regaining independent moblity.

## 2021-05-10 ENCOUNTER — Observation Stay (HOSPITAL_COMMUNITY): Payer: BC Managed Care – PPO

## 2021-05-10 ENCOUNTER — Encounter (HOSPITAL_COMMUNITY): Payer: Self-pay | Admitting: Thoracic Surgery (Cardiothoracic Vascular Surgery)

## 2021-05-10 DIAGNOSIS — D171 Benign lipomatous neoplasm of skin and subcutaneous tissue of trunk: Secondary | ICD-10-CM | POA: Diagnosis not present

## 2021-05-10 LAB — BASIC METABOLIC PANEL
Anion gap: 7 (ref 5–15)
BUN: 23 mg/dL (ref 8–23)
CO2: 24 mmol/L (ref 22–32)
Calcium: 8.8 mg/dL — ABNORMAL LOW (ref 8.9–10.3)
Chloride: 104 mmol/L (ref 98–111)
Creatinine, Ser: 1.26 mg/dL — ABNORMAL HIGH (ref 0.44–1.00)
GFR, Estimated: 49 mL/min — ABNORMAL LOW (ref >60)
Glucose, Bld: 116 mg/dL — ABNORMAL HIGH (ref 70–99)
Potassium: 4.1 mmol/L (ref 3.5–5.1)
Sodium: 135 mmol/L (ref 135–145)

## 2021-05-10 LAB — SURGICAL PATHOLOGY

## 2021-05-10 IMAGING — DX DG CHEST 1V PORT
1 series · 1 of 1 positions shown · non-contrast
Comparison: Portable chest [DATE] and earlier.

CLINICAL DATA: 60-year-old female with soreness following resection
of posterior chest wall lipoma.

EXAM:
PORTABLE CHEST 1 VIEW

[chest ap]
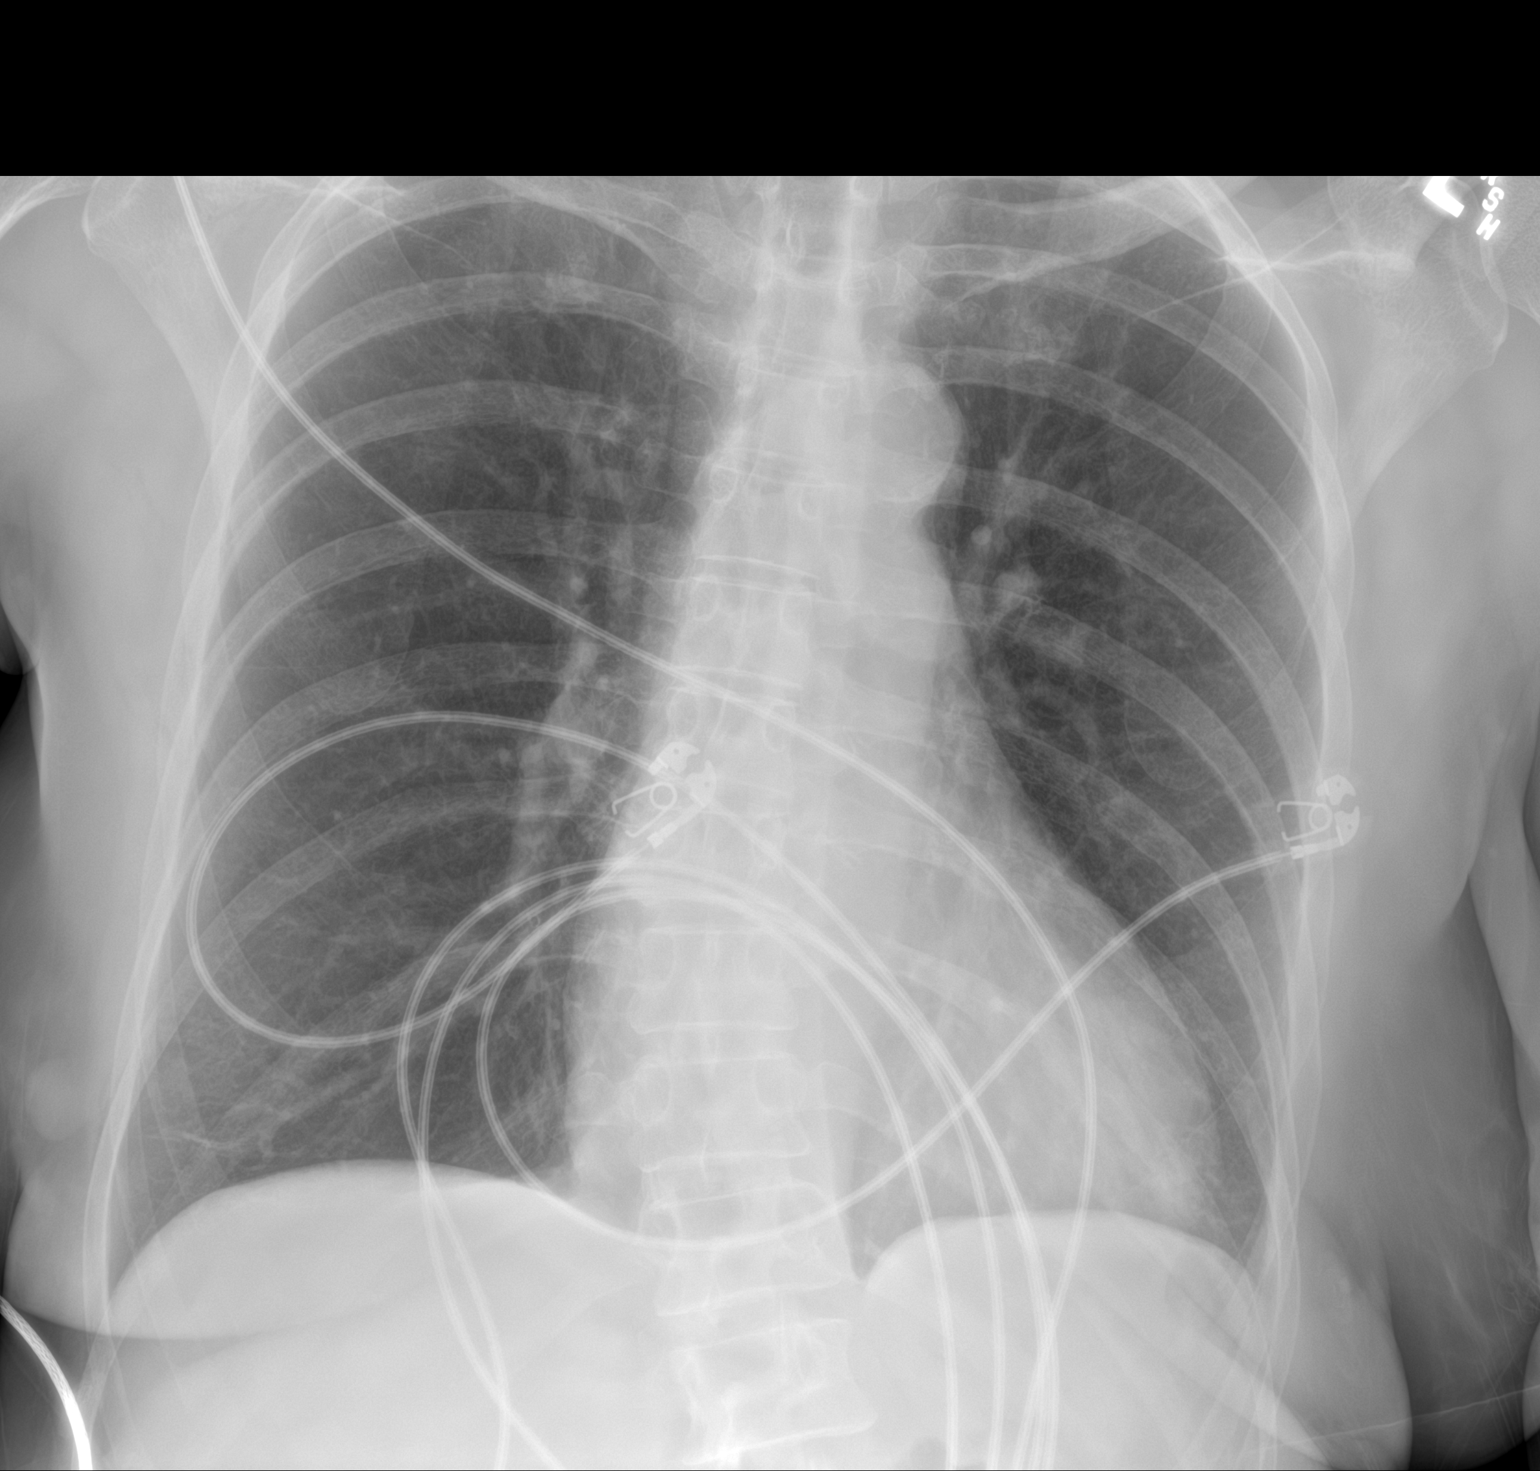

[1 of 1 positions shown; findings below may reference images not displayed]

FINDINGS: Portable AP semi upright view at [DR] hours. Stable borderline to
mild cardiomegaly. Other mediastinal contours are within normal
limits. Visualized tracheal air column is within normal limits. Lung
volumes are stable at the upper limits of normal to mildly
hyperinflated. Allowing for portable technique the lungs are clear.
No pneumothorax. No acute osseous abnormality identified. Mild
scoliosis.
IMPRESSION: Borderline cardiomegaly and pulmonary hyperinflation. No acute
cardiopulmonary abnormality.

## 2021-05-10 MED ORDER — OXYCODONE-ACETAMINOPHEN 5-325 MG PO TABS: 1.0000 | ORAL_TABLET | ORAL | 0 refills | Status: AC | PRN

## 2021-05-10 NOTE — Progress Notes (Signed)
      [address]       [place] [address]             [phone]      1 Day Post-Op Procedure(s) (LRB): CHEST WALL MASS RESECTION (Right) Subjective: Awake and alert, sitting up eating breakfast. Pain controlled.   Objective: Vital signs in last 24 hours: Temp:  [97.7 F (36.5 C)-98.3 F (36.8 C)] 97.7 F (36.5 C) (08/25 0333) Pulse Rate:  [52-70] 66 (08/25 0333) Cardiac Rhythm: Normal sinus rhythm (08/25 0704) Resp:  [10-20] 13 (08/25 0333) BP: (96-138)/(47-87) 104/49 (08/25 0333) SpO2:  [92 %-100 %] 97 % (08/25 0333) Arterial Line BP: (156-165)/(78-105) 165/105 (08/24 0945)    Intake/Output from previous day: 08/24 0701 - 08/25 0700 In: 1100 [I.V.:900; IV Piggyback:200] Out: 795 [Urine:775; Blood:20] Intake/Output this shift: No intake/output data recorded.  General appearance: alert, cooperative, and no distress Neurologic: intact Heart: regular rate and rhythm Lungs: clear breath sounds, no dyspnea on RA.  Wound: the right posterior chest incision is soft, well approximated and dry,   Lab Results: Recent Labs    05/07/21 1150  WBC 6.0  HGB 11.6*  HCT 34.9*  PLT 343   BMET:  Recent Labs    05/07/21 1150 05/10/21 0043  NA 137 135  K 4.2 4.1  CL 104 104  CO2 24 24  GLUCOSE 130* 116*  BUN 27* 23  CREATININE 0.95 1.26*  CALCIUM 8.9 8.8*    PT/INR:  Recent Labs    05/07/21 1150  LABPROT 12.8  INR 1.0   ABG    Component Value Date/Time   PHART 7.423 05/07/2021 1140   HCO3 24.8 05/07/2021 1140   O2SAT 97.5 05/07/2021 1140   CBG (last 3)  No results for input(s): GLUCAP in the last 72 hours.  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 Path pending.  CXR and respiratory status stable and pain well controlled. She is tolerating PO's. Plan discharge to home this morning, instructions given. She is to resume all of her previous  medications except stop the aspirin since Brilinta is being resumed. F/U in the office in 1 week.    LOS: 1 day    Antony Odea, PA-C [phone] 05/10/2021

## 2021-05-14 ENCOUNTER — Telehealth: Payer: Self-pay

## 2021-05-14 ENCOUNTER — Other Ambulatory Visit: Payer: Self-pay | Admitting: Nurse Practitioner

## 2021-05-14 ENCOUNTER — Other Ambulatory Visit: Payer: Self-pay | Admitting: Thoracic Surgery (Cardiothoracic Vascular Surgery)

## 2021-05-14 MED ORDER — OXYCODONE-ACETAMINOPHEN 5-325 MG PO TABS: 1.0000 | ORAL_TABLET | Freq: Four times a day (QID) | ORAL | 0 refills | Status: DC | PRN

## 2021-05-14 NOTE — Telephone Encounter (Signed)
Requested medication (s) are due for refill today: Yes  Requested medication (s) are on the active medication list: Yes  Last refill:  04/24/20  Future visit scheduled: Yes  Notes to clinic:  Prescription expired.    Requested Prescriptions  Pending Prescriptions Disp Refills   PREMARIN vaginal cream [Pharmacy Med Name: PREMARIN VAGINAL CREAM-APPL] 30 g 12    Sig: Place 0.5 grams twice weekly intravaginally.     OB/GYN:  Estrogens Passed - 05/14/2021  1:36 AM      Passed - Mammogram is up-to-date per Health Maintenance      Passed - Last BP in normal range    BP Readings from Last 1 Encounters:  05/10/21 (!) 94/57          Passed - Valid encounter within last 12 months    Recent Outpatient Visits           1 month ago Coronary artery disease of native artery of native heart with stable angina pectoris ([place])   [place], Jolene T, NP   7 months ago Coronary artery disease of native artery of native heart with stable angina pectoris ([place])   [place], [place] T, NP   10 months ago Vaginal atrophy   [place], Jolene T, NP   10 months ago Coronary artery disease of native artery of native heart with stable angina pectoris ([place])   [place], Barbaraann Faster, NP   12 months ago Essential hypertension   [place], Barbaraann Faster, NP       Future Appointments             In 4 months Cannady, Barbaraann Faster, NP [employer], [facility]   In 5 months End, Harrell Gave, MD [employer], [place]   In 11 months [place], [place], [place]

## 2021-05-14 NOTE — Telephone Encounter (Signed)
Requested Prescriptions  Pending Prescriptions Disp Refills  . pantoprazole (PROTONIX) 40 MG tablet [Pharmacy Med Name: PANTOPRAZOLE SOD DR 40 MG TAB] 90 tablet 1    Sig: TAKE 1 TABLET BY MOUTH EVERY DAY     Gastroenterology: Proton Pump Inhibitors Passed - 05/14/2021 11:55 AM      Passed - Valid encounter within last 12 months    Recent Outpatient Visits          1 month ago Coronary artery disease of native artery of native heart with stable angina pectoris ([place])   [place], Jolene T, NP   7 months ago Coronary artery disease of native artery of native heart with stable angina pectoris ([place])   [place], [place] T, NP   10 months ago Vaginal atrophy   [place], Jolene T, NP   10 months ago Coronary artery disease of native artery of native heart with stable angina pectoris ([place])   [place], Barbaraann Faster, NP   12 months ago Essential hypertension   [place], Barbaraann Faster, NP      Future Appointments            In 4 months Cannady, Barbaraann Faster, NP [employer], [facility]   In 5 months End, Harrell Gave, MD [employer], [place]   In 11 months [place], [place], [place]           . levothyroxine (SYNTHROID) 75 MCG tablet [Pharmacy Med Name: LEVOTHYROXINE 75 MCG TABLET] 90 tablet 1    Sig: TAKE 1 TABLET BY MOUTH DAILY BEFORE BREAKFAST.     Endocrinology:  Hypothyroid Agents Failed - 05/14/2021 11:55 AM      Failed - TSH needs to be rechecked within 3 months after an abnormal result. Refill until TSH is due.      Passed - TSH in normal range and within 360 days    TSH  Date Value Ref Range Status  09/22/2020 3.330 0.450 - 4.500 uIU/mL Final         Passed - Valid encounter within last 12 months    Recent Outpatient Visits          1 month ago Coronary artery disease of native artery of native heart with stable angina pectoris ([place])    [place], Jolene T, NP   7 months ago Coronary artery disease of native artery of native heart with stable angina pectoris ([place])   [place], [place] T, NP   10 months ago Vaginal atrophy   [place], Jolene T, NP   10 months ago Coronary artery disease of native artery of native heart with stable angina pectoris ([place])   [place], Barbaraann Faster, NP   12 months ago Essential hypertension   [place], Barbaraann Faster, NP      Future Appointments            In 4 months Cannady, Barbaraann Faster, NP [employer], [facility]   In 5 months End, Harrell Gave, MD [employer], [place]   In 11 months [place], [place], [place]

## 2021-05-14 NOTE — Telephone Encounter (Signed)
Patient contacted the office requesting a refill of her pain medication. Percocet 5/325. She is s/p chest wall mass excision by Dr. Kipp Brood. Dr. Kipp Brood aware of patient requesting refill and is going to send in RX to patient's preferred [facility] [place], [place]. She was advised that the medication would be prescribed today by Dr. Kipp Brood, she acknowledged receipt.

## 2021-05-18 ENCOUNTER — Encounter: Payer: Self-pay | Admitting: Thoracic Surgery (Cardiothoracic Vascular Surgery)

## 2021-05-18 ENCOUNTER — Other Ambulatory Visit: Payer: Self-pay

## 2021-05-18 ENCOUNTER — Ambulatory Visit (INDEPENDENT_AMBULATORY_CARE_PROVIDER_SITE_OTHER): Payer: Self-pay | Admitting: Thoracic Surgery (Cardiothoracic Vascular Surgery)

## 2021-05-18 VITALS — BP 106/67 | HR 79 | Resp 20 | Ht 66.0 in | Wt 146.0 lb

## 2021-05-18 DIAGNOSIS — D171 Benign lipomatous neoplasm of skin and subcutaneous tissue of trunk: Secondary | ICD-10-CM

## 2021-05-18 NOTE — Progress Notes (Signed)
      [address]       [place] [address]             [phone]        April Raymond [facility] Medical Record [id number] Date of Birth: 10-23-1960  Referring: April Ree, MD Primary Care: April Lick, NP Primary Cardiologist:April End, MD  Reason for visit:   follow-up  History of Present Illness:     April Raymond comes in for her 1 week follow-up appointment.  Overall she is doing well.  She continues to have some incisional pain.  She denies any redness or drainage from the incision.  Physical Exam: BP 106/67   Pulse 79   Resp 20   Ht '5\' 6"'$  (1.676 m)   Wt 146 lb (66.2 kg)   LMP 11/16/2007 (Approximate)   SpO2 96% Comment: RA  BMI 23.57 kg/m   Alert NAD Incision clean, swelling noted.  No erythema   Abdomen  ND    Diagnostic Studies & Laboratory data:  Path:  A. SOFT TISSUE, CHEST WALL, EXCISION:  -  Mature adipose tissue consistent with lipoma     Assessment / Plan:   60 year old female status post chest wall resection for a lipoma. Virtual visit in 1 month    April Raymond 05/18/2021 11:42 AM

## 2021-05-22 ENCOUNTER — Other Ambulatory Visit: Payer: Self-pay | Admitting: Thoracic Surgery (Cardiothoracic Vascular Surgery)

## 2021-05-22 MED ORDER — TRAMADOL HCL 50 MG PO TABS: 50.0000 mg | ORAL_TABLET | Freq: Four times a day (QID) | ORAL | 0 refills | Status: DC | PRN

## 2021-06-11 ENCOUNTER — Other Ambulatory Visit: Payer: Self-pay | Admitting: *Deleted

## 2021-06-11 MED ORDER — TRAMADOL HCL 50 MG PO TABS: 50.0000 mg | ORAL_TABLET | Freq: Four times a day (QID) | ORAL | 0 refills | Status: DC | PRN

## 2021-06-11 NOTE — Progress Notes (Signed)
Patient called requesting a refill of pain medication stating she wakes up every 3-4 hours at night because of pain. Patient is s/p chest wall resection 8/24 by Dr. Kipp Brood. Patient states she takes Tylenol currently but provides little relief at night. Per Macarthur Critchley, PA, prescription refill of tramadol sent into patient's preferred pharamcy. Patient aware.

## 2021-06-29 ENCOUNTER — Ambulatory Visit (INDEPENDENT_AMBULATORY_CARE_PROVIDER_SITE_OTHER): Payer: Self-pay | Admitting: Thoracic Surgery (Cardiothoracic Vascular Surgery)

## 2021-06-29 ENCOUNTER — Other Ambulatory Visit: Payer: Self-pay

## 2021-06-29 DIAGNOSIS — R222 Localized swelling, mass and lump, trunk: Secondary | ICD-10-CM

## 2021-06-29 MED ORDER — PREGABALIN 25 MG PO CAPS: 25.0000 mg | ORAL_CAPSULE | Freq: Two times a day (BID) | ORAL | 0 refills | Status: DC

## 2021-06-29 NOTE — Progress Notes (Signed)
     [address]       [place] [address]             [phone]       Patient: Home Provider: Office Consent for Telemedicine visit obtained.  Today's visit was completed via a real-time telehealth (see specific modality noted below). The patient/authorized person provided oral consent at the time of the visit to engage in a telemedicine encounter with the present provider at [facility]. The patient/authorized person was informed of the potential benefits, limitations, and risks of telemedicine. The patient/authorized person expressed understanding that the laws that protect confidentiality also apply to telemedicine. The patient/authorized person acknowledged understanding that telemedicine does not provide emergency services and that he or she would need to call 911 or proceed to the nearest hospital for help if such a need arose.   Total time spent in the clinical discussion 10 minutes.  Telehealth Modality: Phone visit (audio only)  I had a telephone visit with April Raymond.  She is status post successful resection of a lipoma.  She continues to have some pain.  This is not well controlled with Tylenol.  Informed her that this is likely scar tissue and will need warm compresses and massages.  I told her to take a different schedule the Tylenol and have given a prescription for it Lyrica.  I will check back with her in 1 month to assess her pain level.

## 2021-07-02 DIAGNOSIS — G8928 Other chronic postprocedural pain: Secondary | ICD-10-CM

## 2021-07-04 ENCOUNTER — Other Ambulatory Visit: Payer: Self-pay | Admitting: Thoracic Surgery (Cardiothoracic Vascular Surgery)

## 2021-07-04 ENCOUNTER — Telehealth: Payer: Self-pay | Admitting: *Deleted

## 2021-07-04 MED ORDER — METHOCARBAMOL 500 MG PO TABS: 500.0000 mg | ORAL_TABLET | Freq: Four times a day (QID) | ORAL | 0 refills | Status: DC | PRN

## 2021-07-04 NOTE — Telephone Encounter (Signed)
Patient contacted the office stating she is experiencing muscle spasm like pain throughout her back, shoulder blades and neck. Patient is s/p chest wall resection 8/24 by Dr. Kipp Brood. Patient had a virtual telephone call with Dr. Kipp Brood on Friday in which she was given a prescription for Lyrica and was instructed to apply heat and massage to the area in pain. Per patient, the prescribed remedies do not help. Made Dr. Kipp Brood aware and he sent in prescription of robaxin to patient's pharmacy. Patient aware and verbalizes understanding.

## 2021-07-11 ENCOUNTER — Telehealth: Payer: Self-pay | Admitting: *Deleted

## 2021-07-11 NOTE — Telephone Encounter (Signed)
Patient contacted the office stating she is still experiencing pain despite multiple medication prescriptions and other remedies. Patient states that the robaxin prescribed last week has helped the greatest out of all the medication recommendations. Patient describes the pain as having a "locked up muscle".  She states she finds some relief from the hot tub but that quickly fades once she's out. Patient has been in contact with her PCP and was provided a referral to [facility]. Patient states this was provided last week but she has yet to hear from them to schedule an appt. Patient made a virtual visit with Dr. Kipp Brood to discuss symptoms. Patient verbalizes understanding.

## 2021-07-13 ENCOUNTER — Other Ambulatory Visit: Payer: Self-pay

## 2021-07-13 ENCOUNTER — Ambulatory Visit (INDEPENDENT_AMBULATORY_CARE_PROVIDER_SITE_OTHER): Payer: No Typology Code available for payment source | Admitting: Thoracic Surgery (Cardiothoracic Vascular Surgery)

## 2021-07-13 DIAGNOSIS — R222 Localized swelling, mass and lump, trunk: Secondary | ICD-10-CM

## 2021-07-13 MED ORDER — METHOCARBAMOL 500 MG PO TABS: 1000.0000 mg | ORAL_TABLET | Freq: Four times a day (QID) | ORAL | 0 refills | Status: DC | PRN

## 2021-07-13 MED ORDER — TRAMADOL HCL 50 MG PO TABS: 50.0000 mg | ORAL_TABLET | Freq: Four times a day (QID) | ORAL | 0 refills | Status: DC | PRN

## 2021-07-13 NOTE — Progress Notes (Signed)
     [address]       [place] [address]             [phone]       Patient: Home Provider: Office Consent for Telemedicine visit obtained.  Today's visit was completed via a real-time telehealth (see specific modality noted below). The patient/authorized person provided oral consent at the time of the visit to engage in a telemedicine encounter with the present provider at [facility]. The patient/authorized person was informed of the potential benefits, limitations, and risks of telemedicine. The patient/authorized person expressed understanding that the laws that protect confidentiality also apply to telemedicine. The patient/authorized person acknowledged understanding that telemedicine does not provide emergency services and that he or she would need to call 911 or proceed to the nearest hospital for help if such a need arose.   Total time spent in the clinical discussion 10 minutes.  Telehealth Modality: Phone visit (audio only)  I had a telephone visit with April Raymond.  She continues to have muscular pain and tightness.  Now it is above the incision.  She has been on Robaxin and Lyrica.  She thinks that the Robaxin may be helping but she still remains uncomfortable.  I have increased her dose of Robaxin and also given her a refill on her tramadol.  I will touch base with her in 2 weeks.  She is awaiting an appointment at the pain clinic, but this likely will not occur until January.

## 2021-07-13 NOTE — Addendum Note (Signed)
Addended by: [name] on: 07/13/2021 06:45 PM   Modules accepted: Orders

## 2021-07-27 ENCOUNTER — Other Ambulatory Visit: Payer: Self-pay | Admitting: *Deleted

## 2021-07-27 ENCOUNTER — Other Ambulatory Visit: Payer: Self-pay | Admitting: Nurse Practitioner

## 2021-07-27 DIAGNOSIS — R222 Localized swelling, mass and lump, trunk: Secondary | ICD-10-CM

## 2021-07-27 DIAGNOSIS — M546 Pain in thoracic spine: Secondary | ICD-10-CM

## 2021-07-27 MED ORDER — TRAMADOL HCL 50 MG PO TABS: 50.0000 mg | ORAL_TABLET | Freq: Four times a day (QID) | ORAL | 0 refills | Status: DC | PRN

## 2021-07-27 MED ORDER — METHOCARBAMOL 500 MG PO TABS: 1000.0000 mg | ORAL_TABLET | Freq: Four times a day (QID) | ORAL | 0 refills | Status: DC | PRN

## 2021-07-27 NOTE — Progress Notes (Signed)
Patient contacted the office stating she is running out of the tramadol and robaxin Dr. Kipp Brood prescribed 10/28. Patient states the medications have helped make her pain and discomfort manageable. Patient states she has tried to not take the medications and is unable to tolerate the pain. Patient requesting a refill of both. Patient also requesting a referral to PT. Per Dr. Kipp Brood, prescriptions refilled and a referral to outpatient physical therapy in [place] has been placed. Patient aware.

## 2021-07-28 NOTE — Telephone Encounter (Signed)
Requested Prescriptions  Pending Prescriptions Disp Refills  . atorvastatin (LIPITOR) 80 MG tablet [Pharmacy Med Name: ATORVASTATIN 80 MG TABLET] 90 tablet 3    Sig: TAKE 1 TABLET BY MOUTH EVERY DAY     Cardiovascular:  Antilipid - Statins Failed - 07/27/2021  6:56 PM      Failed - Triglycerides in normal range and within 360 days    Triglycerides  Date Value Ref Range Status  03/23/2021 170 (H) 0 - 149 mg/dL Final         Passed - Total Cholesterol in normal range and within 360 days    Cholesterol, Total  Date Value Ref Range Status  03/23/2021 147 100 - 199 mg/dL Final         Passed - LDL in normal range and within 360 days    LDL Chol Calc (NIH)  Date Value Ref Range Status  03/23/2021 58 0 - 99 mg/dL Final         Passed - HDL in normal range and within 360 days    HDL  Date Value Ref Range Status  03/23/2021 61 >39 mg/dL Final         Passed - Patient is not pregnant      Passed - Valid encounter within last 12 months    Recent Outpatient Visits          4 months ago Coronary artery disease of native artery of native heart with stable angina pectoris ([place])   [place], Jolene T, NP   10 months ago Coronary artery disease of native artery of native heart with stable angina pectoris ([place])   [place], [place] T, NP   1 year ago Vaginal atrophy   [place], [place] T, NP   1 year ago Coronary artery disease of native artery of native heart with stable angina pectoris ([place])   [place], Barbaraann Faster, NP   1 year ago Essential hypertension   [place], Barbaraann Faster, NP      Future Appointments            In 1 month Cannady, Barbaraann Faster, NP [employer], [facility]   In 3 months End, Harrell Gave, MD [employer], [facility]   In 9 months [place], [place], [place]           . losartan (COZAAR) 25 MG tablet [Pharmacy Med Name:  LOSARTAN POTASSIUM 25 MG TAB] 45 tablet 1    Sig: TAKE 1/2 TABLET BY MOUTH EVERY DAY     Cardiovascular:  Angiotensin Receptor Blockers Failed - 07/27/2021  6:56 PM      Failed - Cr in normal range and within 180 days    Creatinine, Ser  Date Value Ref Range Status  05/10/2021 1.26 (H) 0.44 - 1.00 mg/dL Final         Passed - K in normal range and within 180 days    Potassium  Date Value Ref Range Status  05/10/2021 4.1 3.5 - 5.1 mmol/L Final         Passed - Patient is not pregnant      Passed - Last BP in normal range    BP Readings from Last 1 Encounters:  05/18/21 106/67         Passed - Valid encounter within last 6 months    Recent Outpatient Visits          4 months ago  Coronary artery disease of native artery of native heart with stable angina pectoris ([place])   [place], [place] T, NP   10 months ago Coronary artery disease of native artery of native heart with stable angina pectoris ([place])   [place], Henrine Screws T, NP   1 year ago Vaginal atrophy   [place], [place] T, NP   1 year ago Coronary artery disease of native artery of native heart with stable angina pectoris ([place])   [place], Barbaraann Faster, NP   1 year ago Essential hypertension   [place], Barbaraann Faster, NP      Future Appointments            In 1 month Cannady, Barbaraann Faster, NP [employer], [place]   In 3 months End, Harrell Gave, MD [employer], [place]   In 9 months [place], [place], [place]

## 2021-07-31 ENCOUNTER — Other Ambulatory Visit: Payer: Self-pay | Admitting: Thoracic Surgery (Cardiothoracic Vascular Surgery)

## 2021-07-31 MED ORDER — PREGABALIN 25 MG PO CAPS
25.0000 mg | ORAL_CAPSULE | Freq: Two times a day (BID) | ORAL | 0 refills | Status: DC
Start: 2021-07-31 — End: 2021-09-12

## 2021-08-03 ENCOUNTER — Other Ambulatory Visit: Payer: Self-pay

## 2021-08-03 ENCOUNTER — Telehealth: Payer: Self-pay | Admitting: Thoracic Surgery (Cardiothoracic Vascular Surgery)

## 2021-08-28 ENCOUNTER — Other Ambulatory Visit: Payer: Self-pay

## 2021-08-28 MED ORDER — TRAMADOL HCL 50 MG PO TABS: 50.0000 mg | ORAL_TABLET | Freq: Four times a day (QID) | ORAL | 0 refills | Status: DC | PRN

## 2021-08-28 NOTE — Progress Notes (Signed)
Called in RX refill of Tramadol 50mg  Q6hr #40 for patient per Dr. Abran Duke request to patient's preferred pharmacy, [facility] in [place], [place]. Patient aware.

## 2021-09-08 DIAGNOSIS — N183 Chronic kidney disease, stage 3 unspecified: Secondary | ICD-10-CM | POA: Insufficient documentation

## 2021-09-12 ENCOUNTER — Other Ambulatory Visit: Payer: Self-pay

## 2021-09-12 ENCOUNTER — Encounter: Payer: Self-pay | Admitting: Nurse Practitioner

## 2021-09-12 ENCOUNTER — Ambulatory Visit (INDEPENDENT_AMBULATORY_CARE_PROVIDER_SITE_OTHER): Payer: No Typology Code available for payment source | Admitting: Nurse Practitioner

## 2021-09-12 VITALS — BP 128/81 | HR 80 | Temp 98.5°F | Ht 66.0 in | Wt 145.0 lb

## 2021-09-12 DIAGNOSIS — I428 Other cardiomyopathies: Secondary | ICD-10-CM | POA: Diagnosis not present

## 2021-09-12 DIAGNOSIS — I739 Peripheral vascular disease, unspecified: Secondary | ICD-10-CM

## 2021-09-12 DIAGNOSIS — E785 Hyperlipidemia, unspecified: Secondary | ICD-10-CM

## 2021-09-12 DIAGNOSIS — Z86018 Personal history of other benign neoplasm: Secondary | ICD-10-CM

## 2021-09-12 DIAGNOSIS — E063 Autoimmune thyroiditis: Secondary | ICD-10-CM

## 2021-09-12 DIAGNOSIS — Z87891 Personal history of nicotine dependence: Secondary | ICD-10-CM

## 2021-09-12 DIAGNOSIS — Z0000 Encounter for general adult medical examination without abnormal findings: Secondary | ICD-10-CM

## 2021-09-12 DIAGNOSIS — E892 Postprocedural hypoparathyroidism: Secondary | ICD-10-CM

## 2021-09-12 DIAGNOSIS — N1832 Chronic kidney disease, stage 3b: Secondary | ICD-10-CM

## 2021-09-12 DIAGNOSIS — Z148 Genetic carrier of other disease: Secondary | ICD-10-CM

## 2021-09-12 DIAGNOSIS — J449 Chronic obstructive pulmonary disease, unspecified: Secondary | ICD-10-CM | POA: Diagnosis not present

## 2021-09-12 DIAGNOSIS — I252 Old myocardial infarction: Secondary | ICD-10-CM

## 2021-09-12 DIAGNOSIS — I10 Essential (primary) hypertension: Secondary | ICD-10-CM

## 2021-09-12 DIAGNOSIS — I25118 Atherosclerotic heart disease of native coronary artery with other forms of angina pectoris: Secondary | ICD-10-CM

## 2021-09-12 DIAGNOSIS — K219 Gastro-esophageal reflux disease without esophagitis: Secondary | ICD-10-CM

## 2021-09-12 LAB — MICROALBUMIN, URINE WAIVED
Creatinine, Urine Waived: 50 mg/dL (ref 10–300)
Microalb, Ur Waived: 10 mg/L (ref 0–19)
Microalb/Creat Ratio: <30 mg/g (ref <30)

## 2021-09-12 MED ORDER — GABAPENTIN 600 MG PO TABS: 300.0000 mg | ORAL_TABLET | Freq: Two times a day (BID) | ORAL | 4 refills | Status: DC

## 2021-09-12 MED ORDER — TICAGRELOR 60 MG PO TABS: ORAL_TABLET | ORAL | 4 refills | Status: DC

## 2021-09-12 MED ORDER — METOPROLOL SUCCINATE ER 25 MG PO TB24
12.5000 mg | ORAL_TABLET | Freq: Every day | ORAL | 4 refills | Status: DC
Start: 2021-09-12 — End: 2022-09-13

## 2021-09-12 MED ORDER — LOSARTAN POTASSIUM 25 MG PO TABS: 12.5000 mg | ORAL_TABLET | Freq: Every day | ORAL | 4 refills | Status: DC

## 2021-09-12 MED ORDER — PANTOPRAZOLE SODIUM 40 MG PO TBEC: 40.0000 mg | DELAYED_RELEASE_TABLET | Freq: Every day | ORAL | 4 refills | Status: DC

## 2021-09-12 MED ORDER — ATORVASTATIN CALCIUM 80 MG PO TABS: 80.0000 mg | ORAL_TABLET | Freq: Every day | ORAL | 4 refills | Status: DC

## 2021-09-12 NOTE — Assessment & Plan Note (Signed)
Recommend continued cessation of smoking. 

## 2021-09-12 NOTE — Assessment & Plan Note (Signed)
History of removal in 2018 and has been stable since, continue to monitor. 

## 2021-09-12 NOTE — Patient Instructions (Signed)
Chest Wall Pain Chest wall pain is pain in or around the bones and muscles of your chest. Sometimes, an injury causes this pain. Excessive coughing or overuse of arm and chest muscles may also cause chest wall pain. Sometimes, the cause may not be known. This pain may take several weeks or longer to get better. Follow these instructions at home: Managing pain, stiffness, and swelling  If directed, put ice on the painful area: Put ice in a plastic bag. Place a towel between your skin and the bag. Leave the ice on for 20 minutes, 2-3 times per day. Activity Rest as told by your health care provider. Avoid activities that cause pain. These include any activities that use your chest muscles or your abdominal and side muscles to lift heavy items. Ask your health care provider what activities are safe for you. General instructions  Take over-the-counter and prescription medicines only as told by your health care provider. Do not use any products that contain nicotine or tobacco, such as cigarettes, e-cigarettes, and chewing tobacco. These can delay healing after injury. If you need help quitting, ask your health care provider. Keep all follow-up visits as told by your health care provider. This is important. Contact a health care provider if: You have a fever. Your chest pain becomes worse. You have new symptoms. Get help right away if: You have nausea or vomiting. You feel sweaty or light-headed. You have a cough with mucus from your lungs (sputum) or you cough up blood. You develop shortness of breath. These symptoms may represent a serious problem that is an emergency. Do not wait to see if the symptoms will go away. Get medical help right away. Call your local emergency services (911 in the [place] Do not drive yourself to the hospital. Summary Chest wall pain is pain in or around the bones and muscles of your chest. Depending on the cause, it may be treated with ice, rest, medicines, and  avoiding activities that cause pain. Contact a health care provider if you have a fever, worsening chest pain, or new symptoms. Get help right away if you feel light-headed or you develop shortness of breath. These symptoms may be an emergency. This information is not intended to replace advice given to you by your health care provider. Make sure you discuss any questions you have with your health care provider. Document Revised: 11/17/2020 Document Reviewed: 11/17/2020 Elsevier Patient Education  [place].

## 2021-09-12 NOTE — Assessment & Plan Note (Signed)
Chronic, ongoing.  Continue current medication regimen and adjust as needed.  Thyroid labs today. 

## 2021-09-12 NOTE — Assessment & Plan Note (Signed)
To chest wall, removed on 05/09/21 and having ongoing pain.  Scheduled to see pain management.  Prefers not to take any further opioids.  Will trial Gabapentin 300 MG QHS, if tolerates may take 300 MG in morning as well -- renal dose.

## 2021-09-12 NOTE — Assessment & Plan Note (Signed)
Chronic, ongoing with BP well below goal at this time. Will continue current medication regimen and adjust as needed.  Continue collaboration with cardiology due to her significant family and personal cardiac history.  Recommend she monitor BP at least a few mornings a week at home and document.  DASH diet at home. Labs today: CMP, CBC, TSH, and lipid.  Return in 6 months.

## 2021-09-12 NOTE — Assessment & Plan Note (Signed)
Chronic, stable. Continue current collaboration with pulmonary and inhaler regimen as ordered by them.  Recommend continued cessation of smoking. 

## 2021-09-12 NOTE — Assessment & Plan Note (Signed)
Diagnosed with pulmonary, continue this collaboration with current inhaler regimen.

## 2021-09-12 NOTE — Assessment & Plan Note (Signed)
Chronic, stable -- noted on past labs.  Recheck CMP today and continue Losartan for kidney protection.  Consider referral to nephrology if significant decline noted.

## 2021-09-12 NOTE — Assessment & Plan Note (Signed)
Chronic, ongoing with history of MI.  Continue statin and adjust dose as needed. Could consider Rosuvastatin in future if any issues with Atorvastatin.  Lipid panel today, she is fasting. 

## 2021-09-12 NOTE — Assessment & Plan Note (Signed)
Ongoing, followed by cardiology.  Continue current medication regimen and collaboration with cardiology. 

## 2021-09-12 NOTE — Progress Notes (Signed)
BP 128/81    Pulse 80    Temp 98.5 F (36.9 C) (Oral)    Ht 5\' 6"  (1.676 m)    Wt 145 lb (65.8 kg)    LMP 11/16/2007 (Approximate)    SpO2 98%    BMI 23.40 kg/m    Subjective:    Patient ID: April Raymond, female    DOB: 12/05/1960, 60 y.o.   MRN: [id number]  HPI: April Raymond is a 60 y.o. female presenting on 09/12/2021 for comprehensive medical examination. Current medical complaints include:none  She currently lives with: husband Menopausal Symptoms: no  Has had ongoing pain since removal of mass chest wall on 05/09/21 -- muscle spasm in intensity -- goes to pain clinic in February.  Has tried -- Lyrica, Oxycodone, Tramadol, Robaxin == they helped with pain, but was getting constipated and dry mouth -- she was taking minimally.    HYPERTENSION WITH CAD & HLD Followed by cardiology , last visit 04/1721, stress-induced cardiomyopathy and angina.  History of MI at age 61. Had parathyroid removed in 2018, history of hyperparathyroid.  She continues on Ticagrelor, Metoprolol, Losartan, Lasix daily and PRN (most often taking second pill), and NTG.  Has not had to use NTG.   Echo on 10/05/20 noted EF 60-65%. Taking Atorvastatin 80 MG. Hypertension status: stable  Satisfied with current treatment? yes Duration of hypertension: chronic BP monitoring frequency:  occasionally BP range:  BP medication side effects:  no Medication compliance: good compliance Aspirin: ticagrelor, ASA Recurrent headaches: no Visual changes: no Palpitations: no Dyspnea: mild at times Chest pain: none recently Lower extremity edema: no Dizzy/lightheaded: no  The ASCVD Risk score (Arnett DK, et al., 2019) failed to calculate for the following reasons:   The patient has a prior MI or stroke diagnosis  CHRONIC KIDNEY DISEASE CKD status: stable Medications renally dose: yes Previous renal evaluation: no Pneumovax:  Not up to Date Influenza Vaccine:  Up to Date   HYPOTHYROIDISM Continues on Levothyroxine  75 MCG daily.  Thyroid control status:stable Satisfied with current treatment? yes Medication side effects: no Medication compliance: good compliance Etiology of hypothyroidism:  Recent dose adjustment:no Fatigue: no Cold intolerance: yes Heat intolerance: no Weight gain: no Weight loss: no Constipation: occasional Diarrhea/loose stools: sometimes Palpitations: no Lower extremity edema: no Anxiety/depressed mood: no   COPD History of smoking, quit at 55 -- smoked 1 PPD.  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  Continues on Anoro and has Albuterol to use as needed, but has not needed to use. COPD status: stable Satisfied with current treatment?: yes Oxygen use: no Dyspnea frequency: none Cough frequency: none Rescue inhaler frequency:  none Limitation of activity: no Productive cough: none Last Spirometry: 2022 Pneumovax: Up to Date Influenza: Up to Date    Depression Screen done today and results listed below:  Depression screen [facility] 2/9 09/12/2021 09/22/2020 09/30/2019  Decreased Interest 2 0 0  Down, Depressed, Hopeless 2 0 0  PHQ - 2 Score 4 0 0  Altered sleeping 0 - -  Tired, decreased energy 0 - -  Change in appetite 0 - -  Feeling bad or failure about yourself  0 - -  Trouble concentrating 0 - -  Moving slowly or fidgety/restless 0 - -  Suicidal thoughts 0 - -  PHQ-9 Score 4 - -   Fall Risk 09/22/2020 05/09/2021 05/09/2021 05/09/2021 09/12/2021  Falls in the past year? 0 - - - 0  Was there an injury  with Fall? - - - - 0  Fall Risk Category Calculator - - - - 0  Fall Risk Category - - - - Low  Patient Fall Risk Level - Moderate fall risk Moderate fall risk Moderate fall risk Low fall risk  Patient at Risk for Falls Due to - - - - No Fall Risks  Fall risk Follow up - - - - Falls evaluation completed     Functional Status Survey: Is the patient deaf or have difficulty hearing?: No Does the patient have difficulty seeing, even when wearing  glasses/contacts?: No Does the patient have difficulty concentrating, remembering, or making decisions?: No Does the patient have difficulty walking or climbing stairs?: No Does the patient have difficulty dressing or bathing?: No Does the patient have difficulty doing errands alone such as visiting a doctor's office or shopping?: No   Past Medical History:  Past Medical History:  Diagnosis Date   Alpha-1-antitrypsin deficiency carrier 02/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 2017 s/p prior MI & PCI RCA ([place]); b. 05/2020 Abnl ETT: 1.5-46mm horizontal ST dep in inflat leads @ peak stress w/ freq PVCs/couplets/bigeminy. HTN response; b. 05/2020 NSTEMI/Cath: LM nl, LAD 32m, LCX 20, RCA 35ost, patent prev placed prox-dist stent. EF 25-35% w/ apical ballooning.   COPD (chronic obstructive pulmonary disease) (HCC)    Hashimoto's disease    Hidradenitis suppurativa    Hydradenitis    Hyperparathyroidism ([place])    Hypertension    Myocardial infarction ([place]) 2017   Pericardial effusion    a. 05/2020 Echo: sm to mod circumferential peric eff w/o tamponade; b. 07/2020 Mod peric effusion; c. 09/2020 Echo: EF 60-65%, no rwma, small tomod effusion w/o compromise.   Takotsubo cardiomyopathy    a. 05/2020 Echo: EF 35-40%, glob HK, though basal wall motion best preserved consistent w/ stress induced CM. Gr1 DD. Nl RV size/fxn. Sm to mod circumferential pericardial effusion w/o tamponade.    Surgical History:  Past Surgical History:  Procedure Laterality Date   BREAST BIOPSY Right 05/04/2021   stereotaic biopsy x clip   BREAST CYST ASPIRATION Right 05/01/2021   BREAST EXCISIONAL BIOPSY Left    age 71's   CARDIAC CATHETERIZATION     CHEST WALL RECONSTRUCTION Right 05/09/2021   Procedure: CHEST WALL MASS RESECTION;  Surgeon: [name], MD;  Location: [place];  Service: Thoracic;  Laterality: Right;  posterior   CYST EXCISION  x2   LEFT HEART CATH  AND CORONARY ANGIOGRAPHY N/A 05/31/2020   Procedure: LEFT HEART CATH AND CORONARY ANGIOGRAPHY;  Surgeon: Nelva Bush, MD;  Location: [facility];  Service: Cardiovascular;  Laterality: N/A;   PARATHYROIDECTOMY     TONSILLECTOMY      Medications:  Current Outpatient Medications on File Prior to Visit  Medication Sig   Albuterol Sulfate (PROAIR RESPICLICK) 702 (90 Base) MCG/ACT AEPB Inhale 2 puffs into the lungs every 6 (six) hours as needed (sob/wheezing).   furosemide (LASIX) 20 MG tablet TAKE 1 TABLET ONCE DAILY AND THE PM PILL TO BE TAKEN AS NEEDED FOR SHORTNESS OF BREATH OR SWELLING   levothyroxine (SYNTHROID) 75 MCG tablet TAKE 1 TABLET BY MOUTH DAILY BEFORE BREAKFAST.   nitroGLYCERIN (NITROSTAT) 0.4 MG SL tablet Place 1 tablet (0.4 mg total) under the tongue every 5 (five) minutes as needed for chest pain.   PREMARIN vaginal cream PLACE 0.5 GRAMS TWICE WEEKLY INTRAVAGINALLY.   umeclidinium-vilanterol (  ANORO ELLIPTA) 62.5-25 MCG/INH AEPB Inhale 1 puff into the lungs daily.   No current facility-administered medications on file prior to visit.    Allergies:  Allergies  Allergen Reactions   Humira [Adalimumab] Rash   Tape Rash    Social History:  Social History   Socioeconomic History   Marital status: Married    Spouse name: Not on file   Number of children: Not on file   Years of education: Not on file   Highest education level: Not on file  Occupational History   Not on file  Tobacco Use   Smoking status: Former    Packs/day: 1.00    Years: 40.00    Pack years: 40.00    Types: Cigarettes    Quit date: 09/18/2015    Years since quitting: 5.9   Smokeless tobacco: Never  Vaping Use   Vaping Use: Never used  Substance and Sexual Activity   Alcohol use: Yes    Comment: on occasion   Drug use: Never   Sexual activity: Not on file  Other Topics Concern   Not on file  Social History Narrative   Not on file   Social Determinants of Health    Financial Resource Strain: Low Risk    Difficulty of Paying Living Expenses: Not hard at all  Food Insecurity: No Food Insecurity   Worried About Charity fundraiser in the Last Year: Never true   Wise in the Last Year: Never true  Transportation Needs: No Transportation Needs   Lack of Transportation (Medical): No   Lack of Transportation (Non-Medical): No  Physical Activity: Sufficiently Active   Days of Exercise per Week: 4 days   Minutes of Exercise per Session: 40 min  Stress: No Stress Concern Present   Feeling of Stress : Only a little  Social Connections: Moderately Integrated   Frequency of Communication with Friends and Family: More than three times a week   Frequency of Social Gatherings with Friends and Family: More than three times a week   Attends Religious Services: More than 4 times per year   Active Member of [employer] or Organizations: No   Attends Music therapist: Never   Marital Status: Married  Human resources officer Violence: Not At Risk   Fear of Current or Ex-Partner: No   Emotionally Abused: No   Physically Abused: No   Sexually Abused: No   Social History   Tobacco Use  Smoking Status Former   Packs/day: 1.00   Years: 40.00   Pack years: 40.00   Types: Cigarettes   Quit date: 09/18/2015   Years since quitting: 5.9  Smokeless Tobacco Never   Social History   Substance and Sexual Activity  Alcohol Use Yes   Comment: on occasion    Family History:  Family History  Problem Relation Age of Onset   Heart attack Mother 63   COPD Mother    Stroke Mother    Diverticulitis Mother    Cancer Mother        breast   Hypertension Mother    Breast cancer Mother 87   Hypertension Father    Stroke Maternal Grandfather    Alzheimer's disease Paternal Grandmother    Heart attack Paternal Grandfather    Breast cancer Cousin        mat cousin x 2   Hypertension Brother     Past medical history, surgical history, medications,  allergies, family history and social history reviewed with  patient today and changes made to appropriate areas of the chart.   ROS All other ROS negative except what is listed above and in the HPI.      Objective:    BP 128/81    Pulse 80    Temp 98.5 F (36.9 C) (Oral)    Ht 5\' 6"  (1.676 m)    Wt 145 lb (65.8 kg)    LMP 11/16/2007 (Approximate)    SpO2 98%    BMI 23.40 kg/m   Wt Readings from Last 3 Encounters:  09/12/21 145 lb (65.8 kg)  05/18/21 146 lb (66.2 kg)  05/09/21 144 lb (65.3 kg)    Physical Exam Vitals and nursing note reviewed. Exam conducted with a chaperone present.  Constitutional:      General: She is awake. She is not in acute distress.    Appearance: She is well-developed. She is not ill-appearing.  HENT:     Head: Normocephalic and atraumatic.     Right Ear: Hearing, tympanic membrane, ear canal and external ear normal. No drainage.     Left Ear: Hearing, tympanic membrane, ear canal and external ear normal. No drainage.     Nose: Nose normal.     Right Sinus: No maxillary sinus tenderness or frontal sinus tenderness.     Left Sinus: No maxillary sinus tenderness or frontal sinus tenderness.     Mouth/Throat:     Mouth: Mucous membranes are moist.     Pharynx: Oropharynx is clear. Uvula midline. No pharyngeal swelling, oropharyngeal exudate or posterior oropharyngeal erythema.  Eyes:     General: Lids are normal.        Right eye: No discharge.        Left eye: No discharge.     Extraocular Movements: Extraocular movements intact.     Conjunctiva/sclera: Conjunctivae normal.     Pupils: Pupils are equal, round, and reactive to light.     Visual Fields: Right eye visual fields normal and left eye visual fields normal.  Neck:     Thyroid: No thyromegaly.     Vascular: No carotid bruit.     Trachea: Trachea normal.  Cardiovascular:     Rate and Rhythm: Normal rate and regular rhythm.     Heart sounds: Normal heart sounds. No murmur heard.   No gallop.   Pulmonary:     Effort: Pulmonary effort is normal. No accessory muscle usage or respiratory distress.     Breath sounds: Normal breath sounds.  Chest:  Breasts:    Right: Normal.     Left: Normal.  Abdominal:     General: Bowel sounds are normal.     Palpations: Abdomen is soft. There is no hepatomegaly or splenomegaly.     Tenderness: There is no abdominal tenderness.  Musculoskeletal:        General: Normal range of motion.     Cervical back: Normal range of motion and neck supple.     Right lower leg: No edema.     Left lower leg: No edema.  Lymphadenopathy:     Head:     Right side of head: No submental, submandibular, tonsillar, preauricular or posterior auricular adenopathy.     Left side of head: No submental, submandibular, tonsillar, preauricular or posterior auricular adenopathy.     Cervical: No cervical adenopathy.     Upper Body:     Right upper body: No supraclavicular, axillary or pectoral adenopathy.     Left upper body: No supraclavicular, axillary  or pectoral adenopathy.  Skin:    General: Skin is warm and dry.     Capillary Refill: Capillary refill takes less than 2 seconds.     Findings: No rash.  Neurological:     Mental Status: She is alert and oriented to person, place, and time.     Gait: Gait is intact.     Deep Tendon Reflexes: Reflexes are normal and symmetric.     Reflex Scores:      Brachioradialis reflexes are 2+ on the right side and 2+ on the left side.      Patellar reflexes are 2+ on the right side and 2+ on the left side. Psychiatric:        Attention and Perception: Attention normal.        Mood and Affect: Mood normal.        Speech: Speech normal.        Behavior: Behavior normal. Behavior is cooperative.        Thought Content: Thought content normal.        Judgment: Judgment normal.    Results for orders placed or performed during the hospital encounter of 41/66/06  Basic metabolic panel  Result Value Ref Range   Sodium 135 135  - 145 mmol/L   Potassium 4.1 3.5 - 5.1 mmol/L   Chloride 104 98 - 111 mmol/L   CO2 24 22 - 32 mmol/L   Glucose, Bld 116 (H) 70 - 99 mg/dL   BUN 23 8 - 23 mg/dL   Creatinine, Ser 1.26 (H) 0.44 - 1.00 mg/dL   Calcium 8.8 (L) 8.9 - 10.3 mg/dL   GFR, Estimated 49 (L) >60 mL/min   Anion gap 7 5 - 15  ABO/Rh  Result Value Ref Range   ABO/RH(D)      A POS Performed at [facility] [address], [place], [place] [address]   Surgical pathology  Result Value Ref Range   SURGICAL PATHOLOGY      SURGICAL PATHOLOGY CASE: [id number] PATIENT: Charleston Poot Surgical Pathology Report     Clinical History: chest wall mass (cm)   FINAL MICROSCOPIC DIAGNOSIS:  A. SOFT TISSUE, CHEST WALL, EXCISION: -  Mature adipose tissue consistent with lipoma  GROSS DESCRIPTION:  Received fresh is a 4.2 x 3.8 x 2.1 cm rubbery yellow lobular lipomatous mass.  There is red-brown muscle attached to the external surface.  The cut surfaces are solid, homogeneous and yellow.  Sections are submitted in 2 cassettes.  [facility] 05/09/2021)     Final Diagnosis performed by Thressa Sheller, MD.   Electronically signed 05/10/2021 Technical and / or Professional components performed at [facility]. [facility], [place] [address] [place], [place] [address].  Immunohistochemistry Technical component (if applicable) was performed at [facility]. [address], [place], [place], [place] [address].   IMMUNOHISTOCHEMISTRY DISCLAIMER (if applicable): Some of these immunohi stochemical stains may have been developed and the performance characteristics determine by [facility]. Some may not have been cleared or approved by the U.S. Food and Drug Administration. The FDA has determined that such clearance or approval is not necessary. This test is used for clinical purposes. It should not be regarded as investigational or for research. This laboratory is certified under the  [place] (CLIA-88) as qualified to perform high complexity clinical laboratory testing.  The controls stained appropriately.       Assessment & Plan:   Problem List Items  Addressed This Visit       Cardiovascular and Mediastinum   Coronary artery disease of native artery of native heart with stable angina pectoris (HCC)    Continue Ticagrelor, ASA, and Atorvastatin for prevention + collaboration with cardiology.  High risk family and patient history.      Relevant Medications   gabapentin (NEURONTIN) 600 MG tablet   atorvastatin (LIPITOR) 80 MG tablet   losartan (COZAAR) 25 MG tablet   metoprolol succinate (TOPROL-XL) 25 MG 24 hr tablet   Essential hypertension    Chronic, ongoing with BP well below goal at this time. Will continue current medication regimen and adjust as needed.  Continue collaboration with cardiology due to her significant family and personal cardiac history.  Recommend she monitor BP at least a few mornings a week at home and document.  DASH diet at home. Labs today: CMP, CBC, TSH, and lipid.  Return in 6 months.       Relevant Medications   atorvastatin (LIPITOR) 80 MG tablet   losartan (COZAAR) 25 MG tablet   metoprolol succinate (TOPROL-XL) 25 MG 24 hr tablet   Other Relevant Orders   Comprehensive metabolic panel   Microalbumin, Urine Waived   Nonischemic cardiomyopathy (HCC) - Primary    Stable, followed by cardiology.  Continue current medication regimen and collaboration with cardiology.      Relevant Medications   atorvastatin (LIPITOR) 80 MG tablet   losartan (COZAAR) 25 MG tablet   metoprolol succinate (TOPROL-XL) 25 MG 24 hr tablet   PAD (peripheral artery disease) (HCC)    Ongoing, followed by cardiology.  Continue current medication regimen and collaboration with cardiology.      Relevant Medications   atorvastatin (LIPITOR) 80 MG tablet   losartan (COZAAR) 25 MG tablet   metoprolol succinate  (TOPROL-XL) 25 MG 24 hr tablet     Respiratory   Moderate COPD (chronic obstructive pulmonary disease) (HCC)    Chronic, stable. Continue current collaboration with pulmonary and inhaler regimen as ordered by them.  Recommend continued cessation of smoking.      Relevant Orders   CBC with Differential/Platelet     Endocrine   Hashimoto's disease    Chronic, ongoing.  Continue current medication regimen and adjust as needed.  Thyroid labs today.      Relevant Medications   metoprolol succinate (TOPROL-XL) 25 MG 24 hr tablet   Other Relevant Orders   TSH   T4, free     Genitourinary   CKD (chronic kidney disease) stage 3, GFR 30-59 ml/min (HCC)    Chronic, stable -- noted on past labs.  Recheck CMP today and continue Losartan for kidney protection.  Consider referral to nephrology if significant decline noted.      Relevant Orders   Comprehensive metabolic panel   Microalbumin, Urine Waived     Other   Alpha-1-antitrypsin deficiency carrier    Diagnosed with pulmonary, continue this collaboration with current inhaler regimen.      Former smoker    Recommend continued cessation of smoking.      H/O lipoma    To chest wall, removed on 05/09/21 and having ongoing pain.  Scheduled to see pain management.  Prefers not to take any further opioids.  Will trial Gabapentin 300 MG QHS, if tolerates may take 300 MG in morning as well -- renal dose.        History of MI (myocardial infarction)    Continue collaboration with cardiology and current regimen as ordered  by them.      History of parathyroidectomy ([place])    History of removal in 2018 and has been stable since, continue to monitor.      Relevant Orders   TSH   VITAMIN D 25 Hydroxy (Vit-D Deficiency, Fractures)   Hyperlipidemia LDL goal <70    Chronic, ongoing with history of MI.  Continue statin and adjust dose as needed. Could consider Rosuvastatin in future if any issues with Atorvastatin.  Lipid panel today, she is  fasting.      Relevant Medications   atorvastatin (LIPITOR) 80 MG tablet   losartan (COZAAR) 25 MG tablet   metoprolol succinate (TOPROL-XL) 25 MG 24 hr tablet   Other Relevant Orders   Lipid Panel w/o Chol/HDL Ratio   Other Visit Diagnoses     Gastroesophageal reflux disease without esophagitis       Continue PPI and check Mag level today.   Relevant Medications   pantoprazole (PROTONIX) 40 MG tablet   Other Relevant Orders   Magnesium   Encounter for annual physical exam       Annual physical with labs today and health maintenance reviewed.        Follow up plan: Return in about 6 months (around 03/13/2022) for HTN/HLD, CAD, THYROID, CKD.   LABORATORY TESTING:  - Pap smear: up to date  IMMUNIZATIONS:   - Tdap: Tetanus vaccination status reviewed: last tetanus booster within 10 years. - Influenza: Up to date - Pneumovax: Not applicable - Prevnar: Not applicable - COVID: Up to date - HPV: Not applicable - Shingrix vaccine: Refused  SCREENING: -Mammogram: Up to date last in August -- to return in 6 months - Colonoscopy: Up to date  - Bone Density: Not applicable  -Hearing Test: Not applicable  -Spirometry: Not applicable   PATIENT COUNSELING:   Advised to take 1 mg of folate supplement per day if capable of pregnancy.   Sexuality: Discussed sexually transmitted diseases, partner selection, use of condoms, avoidance of unintended pregnancy  and contraceptive alternatives.   Advised to avoid cigarette smoking.  I discussed with the patient that most people either abstain from alcohol or drink within safe limits (<=14/week and <=4 drinks/occasion for males, <=7/weeks and <= 3 drinks/occasion for females) and that the risk for alcohol disorders and other health effects rises proportionally with the number of drinks per week and how often a drinker exceeds daily limits.  Discussed cessation/primary prevention of drug use and availability of treatment for abuse.    Diet: Encouraged to adjust caloric intake to maintain  or achieve ideal body weight, to reduce intake of dietary saturated fat and total fat, to limit sodium intake by avoiding high sodium foods and not adding table salt, and to maintain adequate dietary potassium and calcium preferably from fresh fruits, vegetables, and low-fat dairy products.    Stressed the importance of regular exercise  Injury prevention: Discussed safety belts, safety helmets, smoke detector, smoking near bedding or upholstery.   Dental health: Discussed importance of regular tooth brushing, flossing, and dental visits.    NEXT PREVENTATIVE PHYSICAL DUE IN 1 YEAR. Return in about 6 months (around 03/13/2022) for HTN/HLD, CAD, THYROID, CKD.

## 2021-09-12 NOTE — Assessment & Plan Note (Signed)
Stable, followed by cardiology.  Continue current medication regimen and collaboration with cardiology. 

## 2021-09-12 NOTE — Assessment & Plan Note (Signed)
Continue collaboration with cardiology and current regimen as ordered by them. 

## 2021-09-12 NOTE — Assessment & Plan Note (Signed)
Continue Ticagrelor, ASA, and Atorvastatin for prevention + collaboration with cardiology.  High risk family and patient history.

## 2021-09-13 LAB — CBC WITH DIFFERENTIAL/PLATELET
Basophils Absolute: 0.1 10*3/uL (ref 0.0–0.2)
Basos: 2 %
EOS (ABSOLUTE): 0.1 10*3/uL (ref 0.0–0.4)
Eos: 2 %
Hematocrit: 37.1 % (ref 34.0–46.6)
Hemoglobin: 12.5 g/dL (ref 11.1–15.9)
Immature Grans (Abs): 0 10*3/uL (ref 0.0–0.1)
Immature Granulocytes: 0 %
Lymphocytes Absolute: 1.7 10*3/uL (ref 0.7–3.1)
Lymphs: 29 %
MCH: 31.8 pg (ref 26.6–33.0)
MCHC: 33.7 g/dL (ref 31.5–35.7)
MCV: 94 fL (ref 79–97)
Monocytes Absolute: 0.4 10*3/uL (ref 0.1–0.9)
Monocytes: 7 %
Neutrophils Absolute: 3.6 10*3/uL (ref 1.4–7.0)
Neutrophils: 60 %
Platelets: 396 10*3/uL (ref 150–450)
RBC: 3.93 x10E6/uL (ref 3.77–5.28)
RDW: 13.1 % (ref 11.7–15.4)
WBC: 5.9 10*3/uL (ref 3.4–10.8)

## 2021-09-13 LAB — VITAMIN D 25 HYDROXY (VIT D DEFICIENCY, FRACTURES): Vit D, 25-Hydroxy: 49.5 ng/mL (ref 30.0–100.0)

## 2021-09-13 LAB — LIPID PANEL W/O CHOL/HDL RATIO
Cholesterol, Total: 146 mg/dL (ref 100–199)
HDL: 68 mg/dL (ref >39)
LDL Chol Calc (NIH): 58 mg/dL (ref 0–99)
Triglycerides: 114 mg/dL (ref 0–149)
VLDL Cholesterol Cal: 20 mg/dL (ref 5–40)

## 2021-09-13 LAB — COMPREHENSIVE METABOLIC PANEL
ALT: 21 IU/L (ref 0–32)
AST: 22 IU/L (ref 0–40)
Albumin/Globulin Ratio: 2 (ref 1.2–2.2)
Albumin: 4.7 g/dL (ref 3.8–4.8)
Alkaline Phosphatase: 46 IU/L (ref 44–121)
BUN/Creatinine Ratio: 22 (ref 12–28)
BUN: 22 mg/dL (ref 8–27)
Bilirubin Total: 0.5 mg/dL (ref 0.0–1.2)
CO2: 26 mmol/L (ref 20–29)
Calcium: 10 mg/dL (ref 8.7–10.3)
Chloride: 101 mmol/L (ref 96–106)
Creatinine, Ser: 1 mg/dL (ref 0.57–1.00)
Globulin, Total: 2.4 g/dL (ref 1.5–4.5)
Glucose: 83 mg/dL (ref 70–99)
Potassium: 4.4 mmol/L (ref 3.5–5.2)
Sodium: 141 mmol/L (ref 134–144)
Total Protein: 7.1 g/dL (ref 6.0–8.5)
eGFR: 64 mL/min/{1.73_m2} (ref >59)

## 2021-09-13 LAB — MAGNESIUM: Magnesium: 2.1 mg/dL (ref 1.6–2.3)

## 2021-09-13 LAB — TSH: TSH: 4.38 u[IU]/mL (ref 0.450–4.500)

## 2021-09-13 LAB — T4, FREE: Free T4: 1.13 ng/dL (ref 0.82–1.77)

## 2021-09-13 NOTE — Progress Notes (Signed)
Contacted via MyChart  Good evening April Raymond, your labs have returned and overall look great.  I have no concerns on these.  Continue all current medications:) Keep being incredible!!  Thank you for allowing me to participate in your care.  I appreciate you. Kindest regards, Yaslyn Cumby

## 2021-09-18 ENCOUNTER — Encounter: Payer: Self-pay | Admitting: Nurse Practitioner

## 2021-09-18 DIAGNOSIS — R928 Other abnormal and inconclusive findings on diagnostic imaging of breast: Secondary | ICD-10-CM

## 2021-09-20 ENCOUNTER — Encounter: Payer: Self-pay | Admitting: Nurse Practitioner

## 2021-09-21 MED ORDER — NYSTATIN 100000 UNIT/ML MT SUSP: 5.0000 mL | Freq: Four times a day (QID) | OROMUCOSAL | 0 refills | Status: DC

## 2021-10-09 ENCOUNTER — Encounter: Payer: Self-pay | Admitting: Nurse Practitioner

## 2021-10-15 ENCOUNTER — Other Ambulatory Visit: Payer: Self-pay

## 2021-10-15 ENCOUNTER — Ambulatory Visit: Payer: BLUE CROSS/BLUE SHIELD | Attending: Thoracic Surgery (Cardiothoracic Vascular Surgery)

## 2021-10-15 DIAGNOSIS — M546 Pain in thoracic spine: Secondary | ICD-10-CM | POA: Insufficient documentation

## 2021-10-15 DIAGNOSIS — M542 Cervicalgia: Secondary | ICD-10-CM | POA: Diagnosis present

## 2021-10-15 DIAGNOSIS — R293 Abnormal posture: Secondary | ICD-10-CM | POA: Insufficient documentation

## 2021-10-15 DIAGNOSIS — R222 Localized swelling, mass and lump, trunk: Secondary | ICD-10-CM | POA: Diagnosis not present

## 2021-10-15 NOTE — Therapy (Signed)
[facility] [address], [place], [address] Phone: [phone]   Fax:  [phone]  Physical Therapy Evaluation  Patient Details  Name: April Raymond MRN: [id number] Date of Birth: 12-02-1960 Referring Provider (PT): Melodie Bouillon MD   Encounter Date: 10/15/2021   PT End of Session - 10/16/21 0803     Visit Number 1    Number of Visits 17    Date for PT Re-Evaluation 12/10/21    PT Start Time 1503    PT Stop Time 1545    PT Time Calculation (min) 42 min    Activity Tolerance Patient tolerated treatment well    Behavior During Therapy [facility] for tasks assessed/performed             Past Medical History:  Diagnosis Date   Alpha-1-antitrypsin deficiency carrier 02/20/2021   MZ phenotype   Anginal pain ([place])    CAD (coronary artery disease)    a. 2017 s/p prior MI & PCI RCA ([place]); b. 05/2020 Abnl ETT: 1.5-75mm horizontal ST dep in inflat leads @ peak stress w/ freq PVCs/couplets/bigeminy. HTN response; b. 05/2020 NSTEMI/Cath: LM nl, LAD 72m, LCX 20, RCA 35ost, patent prev placed prox-dist stent. EF 25-35% w/ apical ballooning.   COPD (chronic obstructive pulmonary disease) (HCC)    Hashimoto's disease    Hidradenitis suppurativa    Hydradenitis    Hyperparathyroidism ([place])    Hypertension    Myocardial infarction ([place]) 2017   Pericardial effusion    a. 05/2020 Echo: sm to mod circumferential peric eff w/o tamponade; b. 07/2020 Mod peric effusion; c. 09/2020 Echo: EF 60-65%, no rwma, small tomod effusion w/o compromise.   Takotsubo cardiomyopathy    a. 05/2020 Echo: EF 35-40%, glob HK, though basal wall motion best preserved consistent w/ stress induced CM. Gr1 DD. Nl RV size/fxn. Sm to mod circumferential pericardial effusion w/o tamponade.    Past Surgical History:  Procedure Laterality Date   BREAST BIOPSY Right 05/04/2021   stereotaic biopsy x clip   BREAST CYST ASPIRATION Right 05/01/2021    BREAST EXCISIONAL BIOPSY Left    age 21's   CARDIAC CATHETERIZATION     CHEST WALL RECONSTRUCTION Right 05/09/2021   Procedure: CHEST WALL MASS RESECTION;  Surgeon: [name], MD;  Location: [facility];  Service: Thoracic;  Laterality: Right;  posterior   CYST EXCISION  x2   LEFT HEART CATH AND CORONARY ANGIOGRAPHY N/A 05/31/2020   Procedure: LEFT HEART CATH AND CORONARY ANGIOGRAPHY;  Surgeon: Nelva Bush, MD;  Location: [facility];  Service: Cardiovascular;  Laterality: N/A;   PARATHYROIDECTOMY     TONSILLECTOMY      There were no vitals filed for this visit.    Subjective Assessment - 10/15/21 1507     Subjective Pt is a 61 y.o. female referred to OP PT due to thoracic back pain s/p LIpoma removal on 05/09/21.    Pertinent History Pt is a 61 y.o. female referred to OP PT due to thoracic back pain s/p Lipoma removal on 05/09/21 on R side since after surgery. PMH inlcudes: CAD, COPD, Hashimoto's disease, HTN, takotsubo cardiomyopathy.  Pt's pain is described as aching and burning with what she describes as back spasms since heoperation. Worst pain reported as a 5/10 NPS lately, but has been up to a 9/10 around 2 months ago. Currently pain at 4-5/10 NPS. Pain located between B shoulder blades, upper traps, and cervical paraspinals. Does report  B referring pain to lateral aspects of upper arms at deltoids, denies N/T througohut UE's. Throughout 24 hours, neck pain wakes pt up peridocially. Back becomes worse as day goes on with activities such as yard work such as Freight forwarder, any global activity such as walking on treadmill. Pain is improved with heat, pain meds but has stopped taking pain meds. Pt denies B/B changes, significant weight loss, unrelenting night pain. Pt's goal is to improve pain and get back to exercising daily.    Limitations Sitting;House hold activities;Lifting;Standing;Walking    How long can you sit comfortably? 45 minutes    How long can you stand  comfortably? 45 minutes    How long can you walk comfortably? 30 minutes    Patient Stated Goals Improve pain    Currently in Pain? Yes    Pain Score 5     Pain Location Back    Pain Orientation Mid;Right;Left;Upper    Pain Descriptors / Indicators Aching;Dull    Pain Type Chronic pain    Pain Onset More than a month ago    Pain Frequency Intermittent    Aggravating Factors  exercise, walking, household activities    Pain Relieving Factors Heat with temporary relief and pain medications    Effect of Pain on Daily Activities Unable to exercise and perform ADL's without pain            OBJECTIVE  Mental Status Patient's fund of knowledge is within normal limits for educational level.  SENSATION: Grossly intact to light touch bilateral UE as determined by testing dermatomes C2-T2 Proprioception and hot/cold testing deferred on this date   MUSCULOSKELETAL: Tremor: None Bulk: Normal Tone: Normal  Posture: Posture grossly neutral positioning of head and shoulders. Bilat shoulders slightly elevated appearing tense.   Palpation TTP along bilat cervical and thoracic paraspinals from C2 to mid thoracic to bra line where incision is located. Noted trigger points along bilat medial borders of scapula at rhomboids and middle traps.   Strength R/L 5/5 Shoulder flexion (anterior deltoid/pec major/coracobrachialis, axillary n. (C5/6) and musculocutaneous n. (C5-7)) 5/5 Shoulder abduction (deltoid/supraspinatus, axillary/suprascapular n, C5) 5/5 Shoulder external rotation (infraspinatus/teres minor) 5/5 Shoulder internal rotation (subcapularis/lats/pec major) 5/5 Shoulder extension (posterior deltoid, lats, teres major, axillary/thoracodorsal n.) 5/5 Elbow flexion (biceps brachii, brachialis, brachioradialis, musculoskeletal n, C5/6) 5/5 Elbow extension (triceps, radial n, C7) 5/5 Wrist Extension (C6/7) 5/5 Wrist Flexion (C6/7) 5/5 Finger adduction (interossei, ulnar n,  T1) Cervical isometrics are strong in all directions;  AROM R/L 50 Cervical Flexion 32* Cervical Extension 39/42 Cervical Lateral Flexion 60*/60* Cervical Rotation  Shoulder AROM in flexion WNL bilat  *Indicates pain, overpressure performed unless otherwise indicated  PROM PROM > AROM however is painful at end range with all planes *Indicates pain, overpressure performed unless otherwise indicated   Repeated Movements No centralization or peripheralization of symptoms with repeated cervical retraction      Passive Accessory Intervertebral Motion (PAIVM) Pt reports reproduction of neck and thoracic pain with CPA C2-T7 and UPA bilaterally C2-T7. Generally hypomobile throughout.     [facility] PT Assessment - 10/16/21 0001       Assessment   Medical Diagnosis Thoracic pain    Referring Provider (PT) Melodie Bouillon MD    Onset Date/Surgical Date 05/09/21    Prior Therapy No      Precautions   Precautions None      Restrictions   Weight Bearing Restrictions No      Prior Function   Level of Independence Independent  Vocation Retired    Leisure Exercise              Objective measurements completed on examination: See above findings.     PT Education - 10/15/21 1656     Education Details HEP. Form/technique with exercises. POC.    Person(s) Educated Patient    Methods Explanation;Demonstration;Verbal cues;Handout    Comprehension Verbalized understanding;Returned demonstration              PT Short Term Goals - 10/16/21 0815       PT SHORT TERM GOAL #1   Title Pt will be independent with HEP to improve pain and cervical AROM for ADL completion    Baseline 10/15/21: initiated    Time 4    Period Weeks    Status New    Target Date 11/12/21               PT Long Term Goals - 10/16/21 0816       PT LONG TERM GOAL #1   Title Pt will improve cervical AROM in extension and rotation by 10 deg to demonstrate clinically significant  improvement in mobility.    Baseline 10/15/21: extension 32 deg, R/L rotation 60 deg/60 deg    Time 8    Period Weeks    Status New    Target Date 12/10/21      PT LONG TERM GOAL #2   Title Pt will report < 2/10 pain via NPRS with exercise and household task completion to demonstrate clinically significant improvement in pain.    Baseline 10/15/21: 5/10 NPRS    Time 8    Period Weeks    Status New    Target Date 12/11/21      PT LONG TERM GOAL #3   Title Pt will improve FOTO to target score of 68 to demonstrate clinically significant improvement in functional mobility.    Baseline 10/15/21: 53    Time 8    Period Weeks    Status New    Target Date 12/10/21                    Plan - 10/16/21 0803     Clinical Impression Statement Pt is a pleasant 61 y.o. female referred to PT for thoracic back pain. Pt presents with normal sensation to light touch and limited cervical AROM that reproduces concordant pain with extension and rotation. UE strength via MMT grossly 5/5 throughout. Overall posture is neutral with normal head positioning and slightly elevated shoulders bilateral with normal shoulder AROM appreciated. Pt TTP along thoracic and cervical paraspinals up to occiput and B upper and mid traps. Noted over activation of cervical paraspinals in resting position lying in prone with concordant symptoms. Pt hypomobile throughout cervical and upper thoracic spine with CPA's and UPA's with concordant symptoms. No significant deficits present besides trigger points along periscapular and cervicothoracic paraspinals and spinal hypomobility. These deficits are limiting pt in recreational exercise/activities and household tasks involving UE use causing pain and discomfort leading to inactivity. Pt will benefit from skilled PT services to address these impairments to return to PLOF.    Personal Factors and Comorbidities Age;Comorbidity 3+;Fitness;Time since onset of  injury/illness/exacerbation    Comorbidities PMH inlcudes: CAD, COPD, Hashimoto's disease, HTN, takotsubo cardiomyopathy.    Examination-Activity Limitations Sleep;Sit;Bend;Carry;Stand;Lift;Locomotion Level    Examination-Participation Restrictions Community Activity;Yard Work    Stability/Clinical Decision Making Stable/Uncomplicated    Designer, jewellery Low    Rehab Potential Good  PT Frequency 2x / week    PT Duration 8 weeks    PT Treatment/Interventions ADLs/Self Care Home Management;Cryotherapy;Electrical Stimulation;Moist Heat;Traction;Functional mobility training;Therapeutic activities;Therapeutic exercise;Neuromuscular re-education;Patient/family education;Manual techniques;Dry needling;Passive range of motion;Spinal Manipulations;Joint Manipulations    PT Next Visit Plan Reassess HEP, manual techniques for trigger point reduction and improved joint mobility    PT Home Exercise Plan Access Code [facility]    Consulted and Agree with Plan of Care Patient             Patient will benefit from skilled therapeutic intervention in order to improve the following deficits and impairments:  Hypomobility, Pain, Increased muscle spasms, Decreased range of motion  Visit Diagnosis: Pain in thoracic spine  Abnormal posture  Cervicalgia     Problem List Patient Active Problem List   Diagnosis Date Noted   CKD (chronic kidney disease) stage 3, GFR 30-59 ml/min (HCC) 09/08/2021   H/O lipoma 05/09/2021   Alpha-1-antitrypsin deficiency carrier 02/20/2021   Moderate COPD (chronic obstructive pulmonary disease) ([place]) 01/12/2021   PAD (peripheral artery disease) ([place]) 09/22/2020   Nonischemic cardiomyopathy ([place]) 05/31/2020   Former smoker 09/30/2019   History of MI (myocardial infarction) 09/30/2019   History of parathyroidectomy ([place]) 09/30/2019   Coronary artery disease of native artery of native heart with stable angina pectoris ([place]) 09/30/2019   Essential hypertension  09/30/2019   Hyperlipidemia LDL goal <70 09/30/2019   Hashimoto's disease 09/30/2019   Vaginal atrophy 09/30/2019   Hidradenitis suppurativa 09/30/2019    Salem Caster. Fairly IV, PT, DPT Physical Therapist- [facility]  10/16/2021, 8:28 AM  [facility] [address] [place], [address] Phone: [phone]   Fax:  [phone]  Name: SHERREY NORTH MRN: [id number] Date of Birth: 1961/04/08

## 2021-10-16 NOTE — Addendum Note (Signed)
Addended by: Michelene Heady IV on: 10/16/2021 08:32 AM   Modules accepted: Orders

## 2021-10-17 ENCOUNTER — Ambulatory Visit: Payer: BLUE CROSS/BLUE SHIELD | Attending: Thoracic Surgery (Cardiothoracic Vascular Surgery)

## 2021-10-17 ENCOUNTER — Other Ambulatory Visit: Payer: Self-pay

## 2021-10-17 DIAGNOSIS — R293 Abnormal posture: Secondary | ICD-10-CM | POA: Insufficient documentation

## 2021-10-17 DIAGNOSIS — M546 Pain in thoracic spine: Secondary | ICD-10-CM | POA: Insufficient documentation

## 2021-10-17 DIAGNOSIS — M542 Cervicalgia: Secondary | ICD-10-CM | POA: Diagnosis present

## 2021-10-17 NOTE — Therapy (Signed)
[facility] [address], [place], [address] Phone: [phone]   Fax:  [phone]  Physical Therapy Treatment  Patient Details  Name: April Raymond MRN: [id number] Date of Birth: 1961-07-02 Referring Provider (PT): Melodie Bouillon MD   Encounter Date: 10/17/2021   PT End of Session - 10/17/21 1246     Visit Number 2    Number of Visits 17    Date for PT Re-Evaluation 12/10/21    PT Start Time 8938    PT Stop Time 1240    PT Time Calculation (min) 46 min    Activity Tolerance Patient tolerated treatment well    Behavior During Therapy [facility] for tasks assessed/performed             Past Medical History:  Diagnosis Date   Alpha-1-antitrypsin deficiency carrier 02/20/2021   MZ phenotype   Anginal pain ([place])    CAD (coronary artery disease)    a. 2017 s/p prior MI & PCI RCA ([place]); b. 05/2020 Abnl ETT: 1.5-6mm horizontal ST dep in inflat leads @ peak stress w/ freq PVCs/couplets/bigeminy. HTN response; b. 05/2020 NSTEMI/Cath: LM nl, LAD 71m, LCX 20, RCA 35ost, patent prev placed prox-dist stent. EF 25-35% w/ apical ballooning.   COPD (chronic obstructive pulmonary disease) (HCC)    Hashimoto's disease    Hidradenitis suppurativa    Hydradenitis    Hyperparathyroidism ([place])    Hypertension    Myocardial infarction ([place]) 2017   Pericardial effusion    a. 05/2020 Echo: sm to mod circumferential peric eff w/o tamponade; b. 07/2020 Mod peric effusion; c. 09/2020 Echo: EF 60-65%, no rwma, small tomod effusion w/o compromise.   Takotsubo cardiomyopathy    a. 05/2020 Echo: EF 35-40%, glob HK, though basal wall motion best preserved consistent w/ stress induced CM. Gr1 DD. Nl RV size/fxn. Sm to mod circumferential pericardial effusion w/o tamponade.    Past Surgical History:  Procedure Laterality Date   BREAST BIOPSY Right 05/04/2021   stereotaic biopsy x clip   BREAST CYST ASPIRATION Right 05/01/2021    BREAST EXCISIONAL BIOPSY Left    age 65's   CARDIAC CATHETERIZATION     CHEST WALL RECONSTRUCTION Right 05/09/2021   Procedure: CHEST WALL MASS RESECTION;  Surgeon: [name], MD;  Location: [facility];  Service: Thoracic;  Laterality: Right;  posterior   CYST EXCISION  x2   LEFT HEART CATH AND CORONARY ANGIOGRAPHY N/A 05/31/2020   Procedure: LEFT HEART CATH AND CORONARY ANGIOGRAPHY;  Surgeon: Nelva Bush, MD;  Location: [facility];  Service: Cardiovascular;  Laterality: N/A;   PARATHYROIDECTOMY     TONSILLECTOMY      There were no vitals filed for this visit.   Subjective Assessment - 10/17/21 1157     Subjective Pt reports compliant with HEP. Pain reported currently at 4/10 NPS.    Pertinent History Pt is a 61 y.o. female referred to OP PT due to thoracic back pain s/p Lipoma removal on 05/09/21 on R side since after surgery. PMH inlcudes: CAD, COPD, Hashimoto's disease, HTN, takotsubo cardiomyopathy.  Pt's pain is described as aching and burning with what she describes as back spasms since heoperation. Worst pain reported as a 5/10 NPS lately, but has been up to a 9/10 around 2 months ago. Currently pain at 4-5/10 NPS. Pain located between B shoulder blades, upper traps, and cervical paraspinals. Does report B referring pain to lateral aspects of upper arms at  deltoids, denies N/T througohut UE's. Throughout 24 hours, neck pain wakes pt up peridocially. Back becomes worse as day goes on with activities such as yard work such as Freight forwarder, any global activity such as walking on treadmill. Pain is improved with heat, pain meds but has stopped taking pain meds. Pt denies B/B changes, significant weight loss, unrelenting night pain. Pt's goal is to improve pain and get back to exercising daily.    Limitations Sitting;House hold activities;Lifting;Standing;Walking    How long can you sit comfortably? 45 minutes    How long can you stand comfortably? 45 minutes    How long can  you walk comfortably? 30 minutes    Patient Stated Goals Improve pain    Currently in Pain? Yes    Pain Score 4     Pain Location Back    Pain Orientation Right;Left;Mid;Upper    Pain Descriptors / Indicators Aching;Dull    Pain Type Chronic pain    Pain Onset More than a month ago    Pain Frequency Intermittent            There.ex:    Reassessment of HEP   Scap protraction to retraction: x20, 2-3 sec holds   Bilat cervical lateral flexion upper trap stretch: 3x30 sec/side  Supine chin tucks: x20, cuing for form/technique with exercise.    Seated therex with RTB:   B shoulder abduction T's: 2x15  Scap retractions with GTB: 2x15   Low trap shoulder extension: 2x15  Overall requiring mod multimodal cuing for correct form/technique with exercise. Excellent carryover after cues.   Manual Therapy: pt in supine for 10 minutes  STM to cervical paraspinals, upper traps, and suboccipitals for pain relief and AROM improvements  B upper trap stretch: 2x30 sec with OP at contralateral shoulder girdle          Cervical AROM post session:   Flexion: 32 deg  Extension: 42 deg  Rotation R/L: 25/35 deg  Lat flexion R/L: 62/65 deg    PT Education - 10/17/21 1246     Education Details form/technique with exercises.    Person(s) Educated Patient    Methods Explanation;Demonstration;Verbal cues;Tactile cues    Comprehension Verbalized understanding;Returned demonstration              PT Short Term Goals - 10/16/21 0815       PT SHORT TERM GOAL #1   Title Pt will be independent with HEP to improve pain and cervical AROM for ADL completion    Baseline 10/15/21: initiated    Time 4    Period Weeks    Status New    Target Date 11/12/21               PT Long Term Goals - 10/16/21 0816       PT LONG TERM GOAL #1   Title Pt will improve cervical AROM in extension and rotation by 10 deg to demonstrate clinically significant improvement in mobility.    Baseline  10/15/21: extension 32 deg, R/L rotation 60 deg/60 deg    Time 8    Period Weeks    Status New    Target Date 12/10/21      PT LONG TERM GOAL #2   Title Pt will report < 2/10 pain via NPRS with exercise and household task completion to demonstrate clinically significant improvement in pain.    Baseline 10/15/21: 5/10 NPRS    Time 8    Period Weeks    Status New  Target Date 12/11/21      PT LONG TERM GOAL #3   Title Pt will improve FOTO to target score of 68 to demonstrate clinically significant improvement in functional mobility.    Baseline 10/15/21: 53    Time 8    Period Weeks    Status New    Target Date 12/10/21                   Plan - 10/17/21 1247     Clinical Impression Statement Pt reporting to PT with compliance of HEP, pain same as at eval. Focus of session on cervical and periscapular mobility with therex and STM. Post session pt reports pain at baseline, however does demo improvements in cervical AROM compared to eval in extension, rotation, and lateral flexion but remains limited in flexion. Pt will continue to benefit from skilled PT services to improve pain, cervical mobility to return to PLOF.    Personal Factors and Comorbidities Age;Comorbidity 3+;Fitness;Time since onset of injury/illness/exacerbation    Comorbidities PMH inlcudes: CAD, COPD, Hashimoto's disease, HTN, takotsubo cardiomyopathy.    Examination-Activity Limitations Sleep;Sit;Bend;Carry;Stand;Lift;Locomotion Level    Examination-Participation Restrictions Community Activity;Yard Work    Stability/Clinical Decision Making Stable/Uncomplicated    Clinical Decision Making Low    Rehab Potential Good    PT Frequency 2x / week    PT Duration 8 weeks    PT Treatment/Interventions ADLs/Self Care Home Management;Cryotherapy;Electrical Stimulation;Moist Heat;Traction;Functional mobility training;Therapeutic activities;Therapeutic exercise;Neuromuscular re-education;Patient/family education;Manual  techniques;Dry needling;Passive range of motion;Spinal Manipulations;Joint Manipulations    PT Next Visit Plan Cervical/thoracic mobility, manual techniques for trigger point reduction and improved joint mobility    PT Home Exercise Plan Access Code [email or web address]    Consulted and Agree with Plan of Care Patient             Patient will benefit from skilled therapeutic intervention in order to improve the following deficits and impairments:  Hypomobility, Pain, Increased muscle spasms, Decreased range of motion  Visit Diagnosis: Pain in thoracic spine  Abnormal posture  Cervicalgia     Problem List Patient Active Problem List   Diagnosis Date Noted   CKD (chronic kidney disease) stage 3, GFR 30-59 ml/min (HCC) 09/08/2021   H/O lipoma 05/09/2021   Alpha-1-antitrypsin deficiency carrier 02/20/2021   Moderate COPD (chronic obstructive pulmonary disease) ([place]) 01/12/2021   PAD (peripheral artery disease) ([place]) 09/22/2020   Nonischemic cardiomyopathy ([place]) 05/31/2020   Former smoker 09/30/2019   History of MI (myocardial infarction) 09/30/2019   History of parathyroidectomy ([place]) 09/30/2019   Coronary artery disease of native artery of native heart with stable angina pectoris ([place]) 09/30/2019   Essential hypertension 09/30/2019   Hyperlipidemia LDL goal <70 09/30/2019   Hashimoto's disease 09/30/2019   Vaginal atrophy 09/30/2019   Hidradenitis suppurativa 09/30/2019    Salem Caster. Fairly IV, PT, DPT Physical Therapist- [facility]  10/17/2021, 12:53 PM  [facility] [address], [place], [address] Phone: [phone]   Fax:  [phone]  Name: April Raymond MRN: [id number] Date of Birth: November 27, 1960

## 2021-10-21 NOTE — Progress Notes (Signed)
Patient: April Raymond  Service Category: E/M  Provider: Gaspar Cola, MD  DOB: [birth year]  DOS: 10/22/2021  Referring Provider: Venita Lick, NP  MRN: [id number]  Setting: Ambulatory outpatient  PCP: Venita Lick, NP  Type: New Patient  Specialty: Interventional Pain Management    Location: Office  Delivery: Face-to-face     Primary Reason(s) for Visit: Encounter for initial evaluation of one or more chronic problems (new to examiner) potentially causing chronic pain, and posing a threat to normal musculoskeletal function. (Level of risk: High) CC: Back Pain and Neck Pain  HPI  Ms. Barbe is a 61 y.o. year old, female patient, who comes for the first time to our practice referred by Marnee Guarneri T, NP for our initial evaluation of her chronic pain. She has Former smoker; History of MI (myocardial infarction); History of parathyroidectomy ([place]); Coronary artery disease of native artery of native heart with stable angina pectoris ([place]); Essential hypertension; Hyperlipidemia LDL goal <70; Hashimoto's disease; Vaginal atrophy; Hidradenitis suppurativa; Nonischemic cardiomyopathy (HCC); PAD (peripheral artery disease) ([place]); Moderate COPD (chronic obstructive pulmonary disease) ([place]); Alpha-1-antitrypsin deficiency carrier; H/O lipoma; CKD (chronic kidney disease) stage 3, GFR 30-59 ml/min (HCC); Chronic upper back pain (1ry area of Pain) (Right); Chronic thoracic back pain (Bilateral) (R>L); Chronic neck pain (2ry area of Pain) (Bilateral) (R>L); Chronic shoulder pain (3ry area of Pain) (Bilateral); Painful cervical range of motion; Impaired range of motion of cervical spine; and Spasm of thoracic back muscle on their problem list. Today she comes in for evaluation of her Back Pain and Neck Pain  Pain Assessment: Location: Upper, Mid Back Radiating: moves around Onset: More than a month ago Duration: Chronic pain Quality: Aching, Spasm, Constant Severity: 4 /10 (subjective,  self-reported pain score)  Effect on ADL: Limits activities Timing: Constant Modifying factors: heat BP: 134/80   HR: 78  Onset and Duration: Sudden Cause of pain: Surgery Severity: Getting better, NAS-11 at its worse: 9/10, NAS-11 at its best: 3/10, NAS-11 now: 4/10, and NAS-11 on the average: 4/10 Timing: Not influenced by the time of the day Aggravating Factors: Prolonged sitting and Prolonged standing Alleviating Factors: Hot packs and Warm showers or baths Associated Problems: Day-time cramps, Night-time cramps, Fatigue, Pain that wakes patient up, and Pain that does not allow patient to sleep Quality of Pain: Cramping, Sharp, and Throbbing Previous Examinations or Tests: The patient denies test. Previous Treatments: Narcotic medications and Stretching exercises  According to the patient, she had a lipoma removed from the posterior right thoracic wall area on 05/09/2021 (5.5 months ago) after which she started experiencing muscle spasms in her back associated with posterior neck pain and bilateral shoulder pain.  The patient refers that she has been doing some physical therapy, but she is not very happy with the results or the exercises that they have instructed her to do.  She refers that they do not seem to be addressing the problem.  According to the patient the primary area of pain seems to be that of the upper back (Bilateral) (R>L).  The scar from the removal of the lipoma seems to be well-healed, with no evidence of redness, swelling, tenderness, or discharge.  The scar seems to freely move, but she does seem to have some degree of atrophy of the muscle in the surgical area.  MRI images of the lipoma did show the lipoma to be inside of the intercostal area with some of the lipoma actually infiltrated and reaching the thoracic cage.  The patient's secondary area pain seems to be that of the neck (posterior) (Bilateral) (R>L).  She denies any cervical spine surgery or any recent x-rays  of the area.  She does present today with decreased range of motion and some painful range of motion of the cervical spine.  The patient's third area pain is that of the shoulders (Bilateral) (R>L).  This seems to be affecting the posterior musculature including the trapezius muscle and the suprascapular muscles.  Range of motion of the shoulder seems to be adequate.  She denies any pain numbness or weakness going down the arm except for occasional shooting pain into the upper third of the arms, bilaterally.  This appears to happen with certain types of movements but its not constant.  At this point, I suspect that she may be having some cervical degenerative disc disease and/or degenerative joint disease.  In a similar fashion, she may also be experiencing some upper thoracic DJD.  Today I will be ordering x-rays of the cervical spine on flexion and extension as well as a CT of the thoracic spine to further evaluate the thoracic facet joints and paravertebral area.  Today I took the time to provide the patient with information regarding my pain practice. The patient was informed that my practice is divided into two sections: an interventional pain management section, as well as a completely separate and distinct medication management section. I explained that I have procedure days for my interventional therapies, and evaluation days for follow-ups and medication management. Because of the amount of documentation required during both, they are kept separated. This means that there is the possibility that she may be scheduled for a procedure on one day, and medication management the next. I have also informed her that because of staffing and facility limitations, I no longer take patients for medication management only. To illustrate the reasons for this, I gave the patient the example of surgeons, and how inappropriate it would be to refer a patient to his/her care, just to write for the post-surgical  antibiotics on a surgery done by a different surgeon.   Because interventional pain management is my board-certified specialty, the patient was informed that joining my practice means that they are open to any and all interventional therapies. I made it clear that this does not mean that they will be forced to have any procedures done. What this means is that I believe interventional therapies to be essential part of the diagnosis and proper management of chronic pain conditions. Therefore, patients not interested in these interventional alternatives will be better served under the care of a different practitioner.  The patient was also made aware of my Comprehensive Pain Management Safety Guidelines where by joining my practice, they limit all of their nerve blocks and joint injections to those done by our practice, for as long as we are retained to manage their care.   Historic Controlled Substance Pharmacotherapy Review  PMP and historical list of controlled substances: Tramadol 50 mg tablet, 1 tab p.o. 4 times daily (last prescribed 08/28/2021); pregabalin 25 mg capsule, 1 cap p.o. twice daily; oxycodone/APAP 5/325, 1 tab p.o. every 4 hours (last prescribed on 05/14/2021) Current opioid analgesics:   None MME/day: 0 mg/day  Historical Monitoring: The patient  reports no history of drug use. List of all UDS Test(s): No results found for: MDMA, COCAINSCRNUR, [place], [place], CANNABQUANT, [place], [place] List of other Serum/Urine Drug Screening Test(s):  No results found for: AMPHSCRSER, [place], [place], [place], [place],  PCPSCRSER, PCPQUANT, THCSCRSER, THCU, CANNABQUANT, OPIATESCRSER, OXYSCRSER, PROPOXSCRSER, ETH Historical Background Evaluation: [place] PMP: PDMP reviewed during this encounter. Online review of the past 43-monthperiod conducted.             PMP NARX Score Report:  Narcotic: 280 Sedative: 160 Stimulant: 000  Department of public safety, offender search: (Editor, commissioning Information) Non-contributory Risk Assessment Profile: Aberrant behavior: None observed or detected today Risk factors for fatal opioid overdose: None identified today PMP NARX Overdose Risk Score: 300 Fatal overdose hazard ratio (HR): Calculation deferred Non-fatal overdose hazard ratio (HR): Calculation deferred Risk of opioid abuse or dependence: 0.7-3.0% with doses ? 36 MME/day and 6.1-26% with doses ? 120 MME/day. Substance use disorder (SUD) risk level: See below Personal History of Substance Abuse (SUD-Substance use disorder):  Alcohol: Negative  Illegal Drugs: Negative  Rx Drugs: Negative  ORT Risk Level calculation: Moderate Risk  Opioid Risk Tool - 10/22/21 1325       Family History of Substance Abuse   Alcohol Negative    Illegal Drugs Negative    Rx Drugs Positive Female or Female      Personal History of Substance Abuse   Alcohol Negative    Illegal Drugs Negative    Rx Drugs Negative      Age   Age between 150-45years  No      History of Preadolescent Sexual Abuse   History of Preadolescent Sexual Abuse Negative or Female      Psychological Disease   Psychological Disease Negative    Depression Negative      Total Score   Opioid Risk Tool Scoring 4    Opioid Risk Interpretation Moderate Risk            ORT Scoring interpretation table:  Score <3 = Low Risk for SUD  Score between 4-7 = Moderate Risk for SUD  Score >8 = High Risk for Opioid Abuse   PHQ-2 Depression Scale:  Total score: 0  PHQ-2 Scoring interpretation table: (Score and probability of major depressive disorder)  Score 0 = No depression  Score 1 = 15.4% Probability  Score 2 = 21.1% Probability  Score 3 = 38.4% Probability  Score 4 = 45.5% Probability  Score 5 = 56.4% Probability  Score 6 = 78.6% Probability   PHQ-9 Depression Scale:  Total score: 0  PHQ-9 Scoring interpretation table:  Score 0-4 = No depression  Score 5-9 = Mild depression  Score 10-14 = Moderate depression   Score 15-19 = Moderately severe depression  Score 20-27 = Severe depression (2.4 times higher risk of SUD and 2.89 times higher risk of overuse)   Pharmacologic Plan: As per protocol, I have not taken over any controlled substance management, pending the results of ordered tests and/or consults.            Initial impression: Pending review of available data and ordered tests.  Meds   Current Outpatient Medications:    Albuterol Sulfate (PROAIR RESPICLICK) 1725(90 Base) MCG/ACT AEPB, Inhale 2 puffs into the lungs every 6 (six) hours as needed (sob/wheezing)., Disp: 1 each, Rfl: 3   atorvastatin (LIPITOR) 80 MG tablet, Take 1 tablet (80 mg total) by mouth daily., Disp: 90 tablet, Rfl: 4   furosemide (LASIX) 20 MG tablet, TAKE 1 TABLET ONCE DAILY AND THE PM PILL TO BE TAKEN AS NEEDED FOR SHORTNESS OF BREATH OR SWELLING, Disp: 180 tablet, Rfl: 3   levothyroxine (SYNTHROID) 75 MCG tablet, TAKE 1 TABLET BY MOUTH DAILY  BEFORE BREAKFAST., Disp: 90 tablet, Rfl: 1   losartan (COZAAR) 25 MG tablet, Take 0.5 tablets (12.5 mg total) by mouth daily., Disp: 45 tablet, Rfl: 4   metoprolol succinate (TOPROL-XL) 25 MG 24 hr tablet, Take 0.5 tablets (12.5 mg total) by mouth daily., Disp: 45 tablet, Rfl: 4   nitroGLYCERIN (NITROSTAT) 0.4 MG SL tablet, Place 1 tablet (0.4 mg total) under the tongue every 5 (five) minutes as needed for chest pain., Disp: 25 tablet, Rfl: 3   nystatin (MYCOSTATIN) 100000 UNIT/ML suspension, Take 5 mLs (500,000 Units total) by mouth 4 (four) times daily., Disp: 60 mL, Rfl: 0   pantoprazole (PROTONIX) 40 MG tablet, Take 1 tablet (40 mg total) by mouth daily., Disp: 90 tablet, Rfl: 4   PREMARIN vaginal cream, PLACE 0.5 GRAMS TWICE WEEKLY INTRAVAGINALLY., Disp: 30 g, Rfl: 12   ticagrelor (BRILINTA) 60 MG TABS tablet, TAKE 1 TABLET (60 MG TOTAL) BY MOUTH 2 (TWO) TIMES DAILY., Disp: 180 tablet, Rfl: 4   umeclidinium-vilanterol (ANORO ELLIPTA) 62.5-25 MCG/INH AEPB, Inhale 1 puff into the  lungs daily., Disp: 180 each, Rfl: 3  Imaging Review  Thoracic Imaging: Thoracic MR w/wo contrast: Results for orders placed during the hospital encounter of 12/02/19 MR THORACIC SPINE W WO CONTRAST  Narrative CLINICAL DATA:  Soft tissue mass of the right side of back.  EXAM: MRI THORACIC WITHOUT AND WITH CONTRAST  TECHNIQUE: Multiplanar and multiecho pulse sequences of the thoracic spine were obtained without and with intravenous contrast.  CONTRAST:  71m GADAVIST GADOBUTROL 1 MMOL/ML IV SOLN  COMPARISON:  Ultrasound exam dated 11/11/2019  FINDINGS: There is a 3.6 x 2.5 x 1.8 cm lobulated lipoma in the posterior right chest wall protruding between 2 ribs into the right hemithorax. There is no significant non-fatty component to this benign-appearing lipoma. Is deep to the chest wall musculature. The adjacent ribs appear normal. The adjacent vessels are normal. There is slight enhancement of the margins of the mass after contrast administration but there is no internal enhancement of significance.  IMPRESSION: Benign-appearing lipoma in the posterior right chest wall. I suspect that surgical removal would be complicated. If the lesion progresses, this could be re-evaluated but the current exam demonstrates no worrisome characteristics.  Electronically Signed By: JLorriane ShireM.D. On: 12/02/2019 14:11  Complexity Note: Imaging results reviewed. Results shared with Ms. KCallas using Layman's terms.                        ROS  Cardiovascular: High blood pressure and Heart attack ( Date: 10/15/2015) Pulmonary or Respiratory: Snoring  Neurological: No reported neurological signs or symptoms such as seizures, abnormal skin sensations, urinary and/or fecal incontinence, being born with an abnormal open spine and/or a tethered spinal cord Psychological-Psychiatric: No reported psychological or psychiatric signs or symptoms such as difficulty sleeping, anxiety, depression,  delusions or hallucinations (schizophrenial), mood swings (bipolar disorders) or suicidal ideations or attempts Gastrointestinal: No reported gastrointestinal signs or symptoms such as vomiting or evacuating blood, reflux, heartburn, alternating episodes of diarrhea and constipation, inflamed or scarred liver, or pancreas or irrregular and/or infrequent bowel movements Genitourinary: No reported renal or genitourinary signs or symptoms such as difficulty voiding or producing urine, peeing blood, non-functioning kidney, kidney stones, difficulty emptying the bladder, difficulty controlling the flow of urine, or chronic kidney disease Hematological: Brusing easily Endocrine: High thyroid Rheumatologic: No reported rheumatological signs and symptoms such as fatigue, joint pain, tenderness, swelling, redness, heat, stiffness, decreased range of  motion, with or without associated rash Musculoskeletal: Negative for myasthenia gravis, muscular dystrophy, multiple sclerosis or malignant hyperthermia Work History: Retired  Allergies  Ms. Caillier is allergic to humira [adalimumab] and tape.  Laboratory Chemistry Profile   Renal Lab Results  Component Value Date   BUN 22 09/12/2021   CREATININE 1.00 09/12/2021   BCR 22 09/12/2021   GFRAA 59 (L) 09/22/2020   GFRNONAA 49 (L) 05/10/2021   SPECGRAV <1.005 (L) 07/10/2020   PHUR 5.5 07/10/2020   PROTEINUR NEGATIVE 05/07/2021     Electrolytes Lab Results  Component Value Date   NA 141 09/12/2021   K 4.4 09/12/2021   CL 101 09/12/2021   CALCIUM 10.0 09/12/2021   MG 2.1 09/12/2021     Hepatic Lab Results  Component Value Date   AST 22 09/12/2021   ALT 21 09/12/2021   ALBUMIN 4.7 09/12/2021   ALKPHOS 46 09/12/2021   LIPASE 37 05/30/2020     ID Lab Results  Component Value Date   HIV Non Reactive 05/31/2020   SARSCOV2NAA NEGATIVE 05/07/2021   STAPHAUREUS NEGATIVE 05/07/2021   MRSAPCR NEGATIVE 05/07/2021     Bone Lab Results  Component  Value Date   VD25OH 49.5 09/12/2021     Endocrine Lab Results  Component Value Date   GLUCOSE 83 09/12/2021   GLUCOSEU NEGATIVE 05/07/2021   TSH 4.380 09/12/2021   FREET4 1.13 09/12/2021     Neuropathy Lab Results  Component Value Date   HIV Non Reactive 05/31/2020     CNS No results found for: COLORCSF, APPEARCSF, RBCCOUNTCSF, WBCCSF, POLYSCSF, LYMPHSCSF, EOSCSF, PROTEINCSF, GLUCCSF, JCVIRUS, CSFOLI, IGGCSF, LABACHR, ACETBL, LABACHR, ACETBL   Inflammation (CRP: Acute   ESR: Chronic) No results found for: CRP, ESRSEDRATE, LATICACIDVEN   Rheumatology No results found for: RF, ANA, LABURIC, URICUR, LYMEIGGIGMAB, LYMEABIGMQN, HLAB27   Coagulation Lab Results  Component Value Date   INR 1.0 05/07/2021   LABPROT 12.8 05/07/2021   APTT 25 05/07/2021   PLT 396 09/12/2021     Cardiovascular Lab Results  Component Value Date   HGB 12.5 09/12/2021   HCT 37.1 09/12/2021     Screening Lab Results  Component Value Date   SARSCOV2NAA NEGATIVE 05/07/2021   STAPHAUREUS NEGATIVE 05/07/2021   MRSAPCR NEGATIVE 05/07/2021   HIV Non Reactive 05/31/2020     Cancer No results found for: CEA, CA125, LABCA2   Allergens No results found for: ALMOND, APPLE, ASPARAGUS, AVOCADO, BANANA, BARLEY, BASIL, BAYLE[place], GREENBEAN, LIMABEAN, WHITEBEAN, BEEFIGE, REDBEET, BLUEBERRY, BROCCOLI, CABBAGE, MELON, CARROT, CASEIN, CASHEWNUT, CAULIFLOWER, CELERY     Note: Lab results reviewed.  PFSH  Drug: Ms. Breitenstein  reports no history of drug use. Alcohol:  reports current alcohol use. Tobacco:  reports that she quit smoking about 6 years ago. Her smoking use included cigarettes. She has a 40.00 pack-year smoking history. She has never used smokeless tobacco. Medical:  has a past medical history of Alpha-1-antitrypsin deficiency carrier (02/20/2021), Anginal pain ([place]), CAD (coronary artery disease), COPD (chronic obstructive pulmonary disease) ([place]), Hashimoto's disease, Hidradenitis suppurativa,  Hydradenitis, Hyperparathyroidism ([place]), Hypertension, Myocardial infarction ([place]) (2017), Pericardial effusion, and Takotsubo cardiomyopathy. Family: family history includes Alzheimer's disease in her paternal grandmother; Breast cancer in her cousin; Breast cancer (age of onset: 78) in her mother; COPD in her mother; Cancer in her mother; Diverticulitis in her mother; Heart attack in her paternal grandfather; Heart attack (age of onset: 15) in her mother; Hypertension in her brother, father, and mother; Stroke in her maternal grandfather and mother.  Past Surgical History:  Procedure Laterality Date   BREAST BIOPSY Right 05/04/2021   stereotaic biopsy x clip   BREAST CYST ASPIRATION Right 05/01/2021   BREAST EXCISIONAL BIOPSY Left    age 50's   CARDIAC CATHETERIZATION     CHEST WALL RECONSTRUCTION Right 05/09/2021   Procedure: CHEST WALL MASS RESECTION;  Surgeon: [name], MD;  Location: [facility];  Service: Thoracic;  Laterality: Right;  posterior   CYST EXCISION  x2   LEFT HEART CATH AND CORONARY ANGIOGRAPHY N/A 05/31/2020   Procedure: LEFT HEART CATH AND CORONARY ANGIOGRAPHY;  Surgeon: Nelva Bush, MD;  Location: [facility];  Service: Cardiovascular;  Laterality: N/A;   PARATHYROIDECTOMY     TONSILLECTOMY     Active Ambulatory Problems    Diagnosis Date Noted   Former smoker 09/30/2019   History of MI (myocardial infarction) 09/30/2019   History of parathyroidectomy ([place]) 09/30/2019   Coronary artery disease of native artery of native heart with stable angina pectoris ([place]) 09/30/2019   Essential hypertension 09/30/2019   Hyperlipidemia LDL goal <70 09/30/2019   Hashimoto's disease 09/30/2019   Vaginal atrophy 09/30/2019   Hidradenitis suppurativa 09/30/2019   Nonischemic cardiomyopathy ([place]) 05/31/2020   PAD (peripheral artery disease) ([place]) 09/22/2020   Moderate COPD (chronic obstructive pulmonary disease) ([place]) 01/12/2021   Alpha-1-antitrypsin deficiency  carrier 02/20/2021   H/O lipoma 05/09/2021   CKD (chronic kidney disease) stage 3, GFR 30-59 ml/min (HCC) 09/08/2021   Chronic upper back pain (1ry area of Pain) (Right) 10/22/2021   Chronic thoracic back pain (Bilateral) (R>L) 10/22/2021   Chronic neck pain (2ry area of Pain) (Bilateral) (R>L) 10/22/2021   Chronic shoulder pain (3ry area of Pain) (Bilateral) 10/22/2021   Painful cervical range of motion 10/22/2021   Impaired range of motion of cervical spine 10/22/2021   Spasm of thoracic back muscle 10/22/2021   Resolved Ambulatory Problems    Diagnosis Date Noted   Mass of skin of back 11/05/2019   Vaginal pruritus 02/29/2020   NSTEMI (non-ST elevated myocardial infarction) ([place]) 05/31/2020   Pericardial effusion 05/02/2021   Past Medical History:  Diagnosis Date   Anginal pain ([place])    CAD (coronary artery disease)    COPD (chronic obstructive pulmonary disease) ([place])    Hydradenitis    Hyperparathyroidism ([place])    Hypertension    Myocardial infarction ([place]) 2017   Takotsubo cardiomyopathy    Constitutional Exam  General appearance: Well nourished, well developed, and well hydrated. In no apparent acute distress Vitals:   10/22/21 1315  BP: 134/80  Pulse: 78  Resp: 18  Temp: (!) 97.2 F (36.2 C)  SpO2: 98%  Weight: 140 lb (63.5 kg)  Height: _0  (1.676 m)   BMI Assessment: Estimated body mass index is 22.6 kg/m as calculated from the following:   Height as of this encounter: _1  (1.676 m).   Weight as of this encounter: 140 lb (63.5 kg).  BMI interpretation table: BMI level Category Range association with higher incidence of chronic pain  <18 kg/m2 Underweight   18.5-24.9 kg/m2 Ideal body weight   25-29.9 kg/m2 Overweight Increased incidence by 20%  30-34.9 kg/m2 Obese (Class I) Increased incidence by 68%  35-39.9 kg/m2 Severe obesity (Class II) Increased incidence by 136%  >40 kg/m2 Extreme obesity (Class III) Increased incidence by 254%   Patient's  current BMI Ideal Body weight  Body mass index is 22.6 kg/m. Ideal body weight: 59.3 kg (130 lb 11.7 oz) Adjusted ideal  body weight: 61 kg (134 lb 7 oz)   BMI Readings from Last 4 Encounters:  10/22/21 22.60 kg/m  09/12/21 23.40 kg/m  05/18/21 23.57 kg/m  05/09/21 23.24 kg/m   Wt Readings from Last 4 Encounters:  10/22/21 140 lb (63.5 kg)  09/12/21 145 lb (65.8 kg)  05/18/21 146 lb (66.2 kg)  05/09/21 144 lb (65.3 kg)    Psych/Mental status: Alert, oriented x 3 (person, place, & time)       Eyes: PERLA Respiratory: No evidence of acute respiratory distress  Assessment  Primary Diagnosis & Pertinent Problem List: The primary encounter diagnosis was Chronic pain syndrome. Diagnoses of Pharmacologic therapy, Disorder of skeletal system, Problems influencing health status, Chronic upper back pain (1ry area of Pain), Chronic thoracic back pain (Bilateral) (R>L), Chronic shoulder pain (3ry area of Pain) (Bilateral), Chronic neck pain (2ry area of Pain) (Bilateral) (R>L), Painful cervical range of motion, Impaired range of motion of cervical spine, Other intervertebral disc degeneration, thoracic region, and Spasm of thoracic back muscle were also pertinent to this visit.  Visit Diagnosis (New problems to examiner): 1. Chronic pain syndrome   2. Pharmacologic therapy   3. Disorder of skeletal system   4. Problems influencing health status   5. Chronic upper back pain (1ry area of Pain)   6. Chronic thoracic back pain (Bilateral) (R>L)   7. Chronic shoulder pain (3ry area of Pain) (Bilateral)   8. Chronic neck pain (2ry area of Pain) (Bilateral) (R>L)   9. Painful cervical range of motion   10. Impaired range of motion of cervical spine   11. Other intervertebral disc degeneration, thoracic region   12. Spasm of thoracic back muscle    Plan of Care (Initial workup plan)  Note: Ms. Koerber was reminded that as per protocol, today's visit has been an evaluation only. We have not taken  over the patient's controlled substance management.  Problem-specific plan: No problem-specific Assessment & Plan notes found for this encounter.  Lab Orders         Compliance Drug Analysis, Ur         Vitamin B12         Sedimentation rate         C-reactive protein     Imaging Orders         DG Cervical Spine With Flex & Extend         DG Shoulder Right         DG Shoulder Left         CT THORACIC SPINE WO CONTRAST     Referral Orders  No referral(s) requested today   Procedure Orders    No procedure(s) ordered today   Pharmacotherapy (current): Medications ordered:  No orders of the defined types were placed in this encounter.  Medications administered during this visit: Raynaldo Opitz had no medications administered during this visit.   Pharmacological management options:  Opioid Analgesics: The patient was informed that there is no guarantee that she would be a candidate for opioid analgesics. The decision will be made following CDC guidelines. This decision will be based on the results of diagnostic studies, as well as Ms. Revolorio risk profile.   Membrane stabilizer: To be determined at a later time  Muscle relaxant: To be determined at a later time  NSAID: To be determined at a later time  Other analgesic(s): To be determined at a later time   Interventional management options: Ms. Youtz was informed that there  is no guarantee that she would be a candidate for interventional therapies. The decision will be based on the results of diagnostic studies, as well as Ms. Mcanelly risk profile.  Procedure(s) under consideration:  Pending results of ordered studies      Interventional Therapies  Risk   Complexity Considerations:   Estimated body mass index is 22.6 kg/m as calculated from the following:   Height as of this encounter: _0  (1.676 m).   Weight as of this encounter: 140 lb (63.5 kg). WNL   Planned   Pending:   Pending further evaluation   Under  consideration:   Pending further evaluation   Completed:   None at this time   Therapeutic   Palliative (PRN) options:   None established    Provider-requested follow-up: Return for (75mn), Eval-day (M,W), (F2F), 2nd Visit, for review of ordered tests.  Future Appointments  Date Time Provider DMaunie 10/23/2021  1:30 PM LMadaline Savage PT [facility] None  10/31/2021  3:00 PM LMadaline Savage PT [facility] None  11/05/2021  1:20 PM [facility] 11/05/2021  1:40 PM [facility] MM GV-US 1 [facility]  11/06/2021  1:30 PM LJoneen BoersR, PT [facility] None  11/07/2021 10:00 AM End, CHarrell Gave MD [facility]  11/08/2021 10:15 AM LJoneen BoersR, PT [facility] None  11/12/2021  3:00 PM LMadaline Savage PT [facility] None  11/15/2021  1:30 PM LMadaline Savage PT [facility] None  03/13/2022 10:20 AM CVenita Lick NP [facility]  04/25/2022  2:00 PM [place], [place] MD [facility] None    Note by: FGaspar Cola MD Date: 10/22/2021; Time: 2:35 PM

## 2021-10-22 ENCOUNTER — Ambulatory Visit: Payer: BLUE CROSS/BLUE SHIELD | Attending: Pain Medicine | Admitting: Pain Medicine

## 2021-10-22 ENCOUNTER — Encounter: Payer: Self-pay | Admitting: Pain Medicine

## 2021-10-22 ENCOUNTER — Other Ambulatory Visit: Payer: Self-pay

## 2021-10-22 VITALS — BP 134/80 | HR 78 | Temp 97.2°F | Resp 18 | Ht 66.0 in | Wt 140.0 lb

## 2021-10-22 DIAGNOSIS — M549 Dorsalgia, unspecified: Secondary | ICD-10-CM | POA: Diagnosis present

## 2021-10-22 DIAGNOSIS — M25511 Pain in right shoulder: Secondary | ICD-10-CM | POA: Diagnosis present

## 2021-10-22 DIAGNOSIS — M542 Cervicalgia: Secondary | ICD-10-CM

## 2021-10-22 DIAGNOSIS — Z789 Other specified health status: Secondary | ICD-10-CM | POA: Diagnosis not present

## 2021-10-22 DIAGNOSIS — M25512 Pain in left shoulder: Secondary | ICD-10-CM

## 2021-10-22 DIAGNOSIS — M546 Pain in thoracic spine: Secondary | ICD-10-CM | POA: Diagnosis present

## 2021-10-22 DIAGNOSIS — G8929 Other chronic pain: Secondary | ICD-10-CM | POA: Diagnosis present

## 2021-10-22 DIAGNOSIS — M5134 Other intervertebral disc degeneration, thoracic region: Secondary | ICD-10-CM

## 2021-10-22 DIAGNOSIS — G894 Chronic pain syndrome: Secondary | ICD-10-CM | POA: Diagnosis not present

## 2021-10-22 DIAGNOSIS — M5382 Other specified dorsopathies, cervical region: Secondary | ICD-10-CM

## 2021-10-22 DIAGNOSIS — M62830 Muscle spasm of back: Secondary | ICD-10-CM

## 2021-10-22 DIAGNOSIS — M899 Disorder of bone, unspecified: Secondary | ICD-10-CM | POA: Diagnosis not present

## 2021-10-22 DIAGNOSIS — Z79899 Other long term (current) drug therapy: Secondary | ICD-10-CM | POA: Diagnosis not present

## 2021-10-22 NOTE — Patient Instructions (Signed)
____________________________________________________________________________________________  Muscle Spasms & Cramps  Cause:  The most common cause of muscle spasms and cramps is vitamin and/or electrolyte (calcium, potassium, sodium, etc.) deficiencies.  Possible triggers: Sweating - causes loss of electrolytes thru the skin. Steroids - causes loss of electrolytes thru the urine.  Treatment: Gatorade (or any other electrolyte-replenishing drink) - Take 1, 8 oz glass with each meal (3 times a day). OTC (over-the-counter) Magnesium 400 to 500 mg - Take 1 tablet twice a day (one with breakfast and one before bedtime). If you have kidney problems, talk to your primary care physician before taking any Magnesium. Tonic Water with quinine - Take 1, 8 oz glass before bedtime.   ____________________________________________________________________________________________   

## 2021-10-23 ENCOUNTER — Ambulatory Visit: Payer: BLUE CROSS/BLUE SHIELD

## 2021-10-23 ENCOUNTER — Encounter: Payer: Self-pay | Admitting: Nurse Practitioner

## 2021-10-23 DIAGNOSIS — M546 Pain in thoracic spine: Secondary | ICD-10-CM

## 2021-10-23 DIAGNOSIS — R293 Abnormal posture: Secondary | ICD-10-CM

## 2021-10-23 DIAGNOSIS — M542 Cervicalgia: Secondary | ICD-10-CM

## 2021-10-23 LAB — C-REACTIVE PROTEIN: CRP: <1 mg/L (ref 0–10)

## 2021-10-23 LAB — VITAMIN B12: Vitamin B-12: 792 pg/mL (ref 232–1245)

## 2021-10-23 LAB — SEDIMENTATION RATE: Sed Rate: 8 mm/hr (ref 0–40)

## 2021-10-23 NOTE — Patient Instructions (Signed)
Access Code: [email or web address] URL: [email or web address] Date: 10/23/2021 Prepared by: Joneen Boers  Exercises Supine Chin Tuck - 1 x daily - 7 x weekly - 2 sets - 20 reps Seated Cervical Sidebending Stretch - 1 x daily - 7 x weekly - 2 sets - 3 reps - 30 hold Seated Scapular Retraction - 1 x daily - 7 x weekly - 2 sets - 20 reps Standing Lumbar Spine Flexion Stretch Counter - 1 x daily - 7 x weekly - 2 sets - 3 reps - 30 hold Seated Flexion Stretch - 1 x daily - 7 x weekly - 1 sets - 10 reps - 10 seconds hold Seated Sidebending - 3 x daily - 7 x weekly - 1 sets - 10 reps - 5 seconds hold

## 2021-10-23 NOTE — Therapy (Signed)
[facility] [address], [place], [address] Phone: [phone]   Fax:  [phone]  Physical Therapy Treatment  Patient Details  Name: April Raymond MRN: [id number] Date of Birth: Oct 21, 1960 Referring Provider (PT): Melodie Bouillon MD   Encounter Date: 10/23/2021   PT End of Session - 10/23/21 1332     Visit Number 3    Number of Visits 17    Date for PT Re-Evaluation 12/10/21    PT Start Time 1332    PT Stop Time 3016    PT Time Calculation (min) 43 min    Activity Tolerance Patient tolerated treatment well    Behavior During Therapy [facility] for tasks assessed/performed             Past Medical History:  Diagnosis Date   Alpha-1-antitrypsin deficiency carrier 02/20/2021   MZ phenotype   Anginal pain ([place])    CAD (coronary artery disease)    a. 2017 s/p prior MI & PCI RCA ([place]); b. 05/2020 Abnl ETT: 1.5-49mm horizontal ST dep in inflat leads @ peak stress w/ freq PVCs/couplets/bigeminy. HTN response; b. 05/2020 NSTEMI/Cath: LM nl, LAD 58m, LCX 20, RCA 35ost, patent prev placed prox-dist stent. EF 25-35% w/ apical ballooning.   COPD (chronic obstructive pulmonary disease) (HCC)    Hashimoto's disease    Hidradenitis suppurativa    Hydradenitis    Hyperparathyroidism ([place])    Hypertension    Myocardial infarction ([place]) 2017   Pericardial effusion    a. 05/2020 Echo: sm to mod circumferential peric eff w/o tamponade; b. 07/2020 Mod peric effusion; c. 09/2020 Echo: EF 60-65%, no rwma, small tomod effusion w/o compromise.   Takotsubo cardiomyopathy    a. 05/2020 Echo: EF 35-40%, glob HK, though basal wall motion best preserved consistent w/ stress induced CM. Gr1 DD. Nl RV size/fxn. Sm to mod circumferential pericardial effusion w/o tamponade.    Past Surgical History:  Procedure Laterality Date   BREAST BIOPSY Right 05/04/2021   stereotaic biopsy x clip   BREAST CYST ASPIRATION Right 05/01/2021    BREAST EXCISIONAL BIOPSY Left    age 15's   CARDIAC CATHETERIZATION     CHEST WALL RECONSTRUCTION Right 05/09/2021   Procedure: CHEST WALL MASS RESECTION;  Surgeon: [name], MD;  Location: [facility];  Service: Thoracic;  Laterality: Right;  posterior   CYST EXCISION  x2   LEFT HEART CATH AND CORONARY ANGIOGRAPHY N/A 05/31/2020   Procedure: LEFT HEART CATH AND CORONARY ANGIOGRAPHY;  Surgeon: Nelva Bush, MD;  Location: [facility];  Service: Cardiovascular;  Laterality: N/A;   PARATHYROIDECTOMY     TONSILLECTOMY      There were no vitals filed for this visit.   Subjective Assessment - 10/23/21 1333     Subjective Was worse Saturday but got better the last 2 days. Very stiff at upper back and neck. Did not have a problem since her surgery. Feels like muscle. It moves. The surgery was R posterior thoracic spine. 4-5/10 currently between her shoulder blades and up her posterior R and L lateral neck. The pain at area of surgery is better unless someone pushes on it.    Pertinent History Pt is a 61 y.o. female referred to OP PT due to thoracic back pain s/p Lipoma removal on 05/09/21 on R side since after surgery. PMH inlcudes: CAD, COPD, Hashimoto's disease, HTN, takotsubo cardiomyopathy.  Pt's pain is described as aching and burning with what  she describes as back spasms since heoperation. Worst pain reported as a 5/10 NPS lately, but has been up to a 9/10 around 2 months ago. Currently pain at 4-5/10 NPS. Pain located between B shoulder blades, upper traps, and cervical paraspinals. Does report B referring pain to lateral aspects of upper arms at deltoids, denies N/T througohut UE's. Throughout 24 hours, neck pain wakes pt up peridocially. Back becomes worse as day goes on with activities such as yard work such as Freight [employer], any global activity such as walking on treadmill. Pain is improved with heat, pain meds but has stopped taking pain meds. Pt denies B/B changes, significant  weight loss, unrelenting night pain. Pt's goal is to improve pain and get back to exercising daily.    Limitations Sitting;House hold activities;Lifting;Standing;Walking    How long can you sit comfortably? 45 minutes    How long can you stand comfortably? 45 minutes    How long can you walk comfortably? 30 minutes    Patient Stated Goals Improve pain    Currently in Pain? Yes    Pain Score 5     Pain Onset More than a month ago                                      Pain locations: area of incision and neck  Aggravating factors: sitting or standing for to long.       PT Education - 10/23/21 1423     Education Details ther-ex, HEP    Person(s) Educated Patient    Methods Explanation;Demonstration;Tactile cues;Verbal cues;Handout    Comprehension Returned demonstration;Verbalized understanding           There.ex:             Seated Scapular retraction with manual resistance targeting lower trap muscle  R 10x5 seconds for 3 sets  Decreased R upper trap muscle tension palpated. However increased R posterior thoracic discomfort.   Seated R first rib stretch with PT manual stabilization R first rib 15 seconds x 5  Decreased neck discomfort   Seated B scapular retraction with R and L cervical rotation.  Decreased neck pain to 1/10 after aforementioned treatment.    Seated trunk side bend  L : increased pinch sensation at surgical site  R: feels good. 10x5 seconds for 2 sets performed. Cues to not shrug shoulders.    Decreased R thoracic back pain to 1/10    Improved exercise technique, movement at target joints, use of target muscles after mod verbal, visual, tactile cues.     Manual Therapy             seated STM to R cervical paraspinal and upper trap muscle to decrease tension  Tense R upper trap muscle palpated     Response to treatment Decreased neck and R thoracic back pain to 1/10 after session    Clinical  impression Decreased neck pain with treatment to promote lower trap and anterior cervical muscle activation to help decrease posterior cervical paraspinal and upper trap muscle tension. Decreased R thoracic back pain with R trunk side bending (directional preference). 1/10 at all areas towards end of treatment reported. Pt will benefit from continued skilled physical therapy services to decrease pain, improve strength and function.        PT Short Term Goals - 10/16/21 0815       PT SHORT TERM GOAL #1  Title Pt will be independent with HEP to improve pain and cervical AROM for ADL completion    Baseline 10/15/21: initiated    Time 4    Period Weeks    Status New    Target Date 11/12/21               PT Long Term Goals - 10/16/21 0816       PT LONG TERM GOAL #1   Title Pt will improve cervical AROM in extension and rotation by 10 deg to demonstrate clinically significant improvement in mobility.    Baseline 10/15/21: extension 32 deg, R/L rotation 60 deg/60 deg    Time 8    Period Weeks    Status New    Target Date 12/10/21      PT LONG TERM GOAL #2   Title Pt will report < 2/10 pain via NPRS with exercise and household task completion to demonstrate clinically significant improvement in pain.    Baseline 10/15/21: 5/10 NPRS    Time 8    Period Weeks    Status New    Target Date 12/11/21      PT LONG TERM GOAL #3   Title Pt will improve FOTO to target score of 68 to demonstrate clinically significant improvement in functional mobility.    Baseline 10/15/21: 53    Time 8    Period Weeks    Status New    Target Date 12/10/21                   Plan - 10/23/21 1422     Clinical Impression Statement Decreased neck pain with treatment to promote lower trap and anterior cervical muscle activation to help decrease posterior cervical paraspinal and upper trap muscle tension. Decreased R thoracic back pain with R trunk side bending (directional preference). 1/10 at  all areas towards end of treatment reported. Pt will benefit from continued skilled physical therapy services to decrease pain, improve strength and function.    Personal Factors and Comorbidities Age;Comorbidity 3+;Fitness;Time since onset of injury/illness/exacerbation    Comorbidities PMH inlcudes: CAD, COPD, Hashimoto's disease, HTN, takotsubo cardiomyopathy.    Examination-Activity Limitations Sleep;Sit;Bend;Carry;Stand;Lift;Locomotion Level    Examination-Participation Restrictions Community Activity;Yard Work    Stability/Clinical Decision Making Stable/Uncomplicated    Clinical Decision Making Low    Rehab Potential Good    PT Frequency 2x / week    PT Duration 8 weeks    PT Treatment/Interventions ADLs/Self Care Home Management;Cryotherapy;Electrical Stimulation;Moist Heat;Traction;Functional mobility training;Therapeutic activities;Therapeutic exercise;Neuromuscular re-education;Patient/family education;Manual techniques;Dry needling;Passive range of motion;Spinal Manipulations;Joint Manipulations    PT Next Visit Plan Cervical/thoracic mobility, manual techniques for trigger point reduction and improved joint mobility    PT Home Exercise Plan Access Code [facility]    Consulted and Agree with Plan of Care Patient             Patient will benefit from skilled therapeutic intervention in order to improve the following deficits and impairments:  Hypomobility, Pain, Increased muscle spasms, Decreased range of motion  Visit Diagnosis: Pain in thoracic spine  Abnormal posture  Cervicalgia     Problem List Patient Active Problem List   Diagnosis Date Noted   Chronic upper back pain (1ry area of Pain) (Right) 10/22/2021   Chronic thoracic back pain (Bilateral) (R>L) 10/22/2021   Chronic neck pain (2ry area of Pain) (Bilateral) (R>L) 10/22/2021   Chronic shoulder pain (3ry area of Pain) (Bilateral) 10/22/2021   Painful cervical range of motion 10/22/2021  Impaired range of  motion of cervical spine 10/22/2021   Spasm of thoracic back muscle 10/22/2021   CKD (chronic kidney disease) stage 3, GFR 30-59 ml/min (HCC) 09/08/2021   H/O lipoma 05/09/2021   Alpha-1-antitrypsin deficiency carrier 02/20/2021   Moderate COPD (chronic obstructive pulmonary disease) ([place]) 01/12/2021   PAD (peripheral artery disease) ([place]) 09/22/2020   Nonischemic cardiomyopathy ([place]) 05/31/2020   Former smoker 09/30/2019   History of MI (myocardial infarction) 09/30/2019   History of parathyroidectomy ([place]) 09/30/2019   Coronary artery disease of native artery of native heart with stable angina pectoris ([place]) 09/30/2019   Essential hypertension 09/30/2019   Hyperlipidemia LDL goal <70 09/30/2019   Hashimoto's disease 09/30/2019   Vaginal atrophy 09/30/2019   Hidradenitis suppurativa 09/30/2019   Joneen Boers PT, DPT  10/23/2021, 2:24 PM  [facility] [address] [place], [address] Phone: [phone]   Fax:  [phone]  Name: April Raymond MRN: [id number] Date of Birth: 29-Nov-1960

## 2021-10-25 ENCOUNTER — Ambulatory Visit
Admission: RE | Admit: 2021-10-25 | Discharge: 2021-10-25 | Disposition: A | Discharge status: Home / Self Care | Payer: BLUE CROSS/BLUE SHIELD | Source: Ambulatory Visit | Attending: Pain Medicine | Admitting: Pain Medicine

## 2021-10-25 DIAGNOSIS — M5382 Other specified dorsopathies, cervical region: Secondary | ICD-10-CM

## 2021-10-25 DIAGNOSIS — M542 Cervicalgia: Secondary | ICD-10-CM | POA: Diagnosis not present

## 2021-10-25 DIAGNOSIS — M25511 Pain in right shoulder: Secondary | ICD-10-CM | POA: Diagnosis present

## 2021-10-25 DIAGNOSIS — G8929 Other chronic pain: Secondary | ICD-10-CM | POA: Diagnosis present

## 2021-10-25 DIAGNOSIS — M25512 Pain in left shoulder: Secondary | ICD-10-CM | POA: Insufficient documentation

## 2021-10-25 IMAGING — CR DG SHOULDER 2+V*L*
1 series · 3 of 3 positions shown · non-contrast
Comparison: None.

CLINICAL DATA: Neck and bilateral shoulder pain

EXAM:
LEFT SHOULDER - 2+ VIEW

[Series 1: dg shoulder left · 0.14mm/px · 3 of 3 slices shown]
[im 1/3]
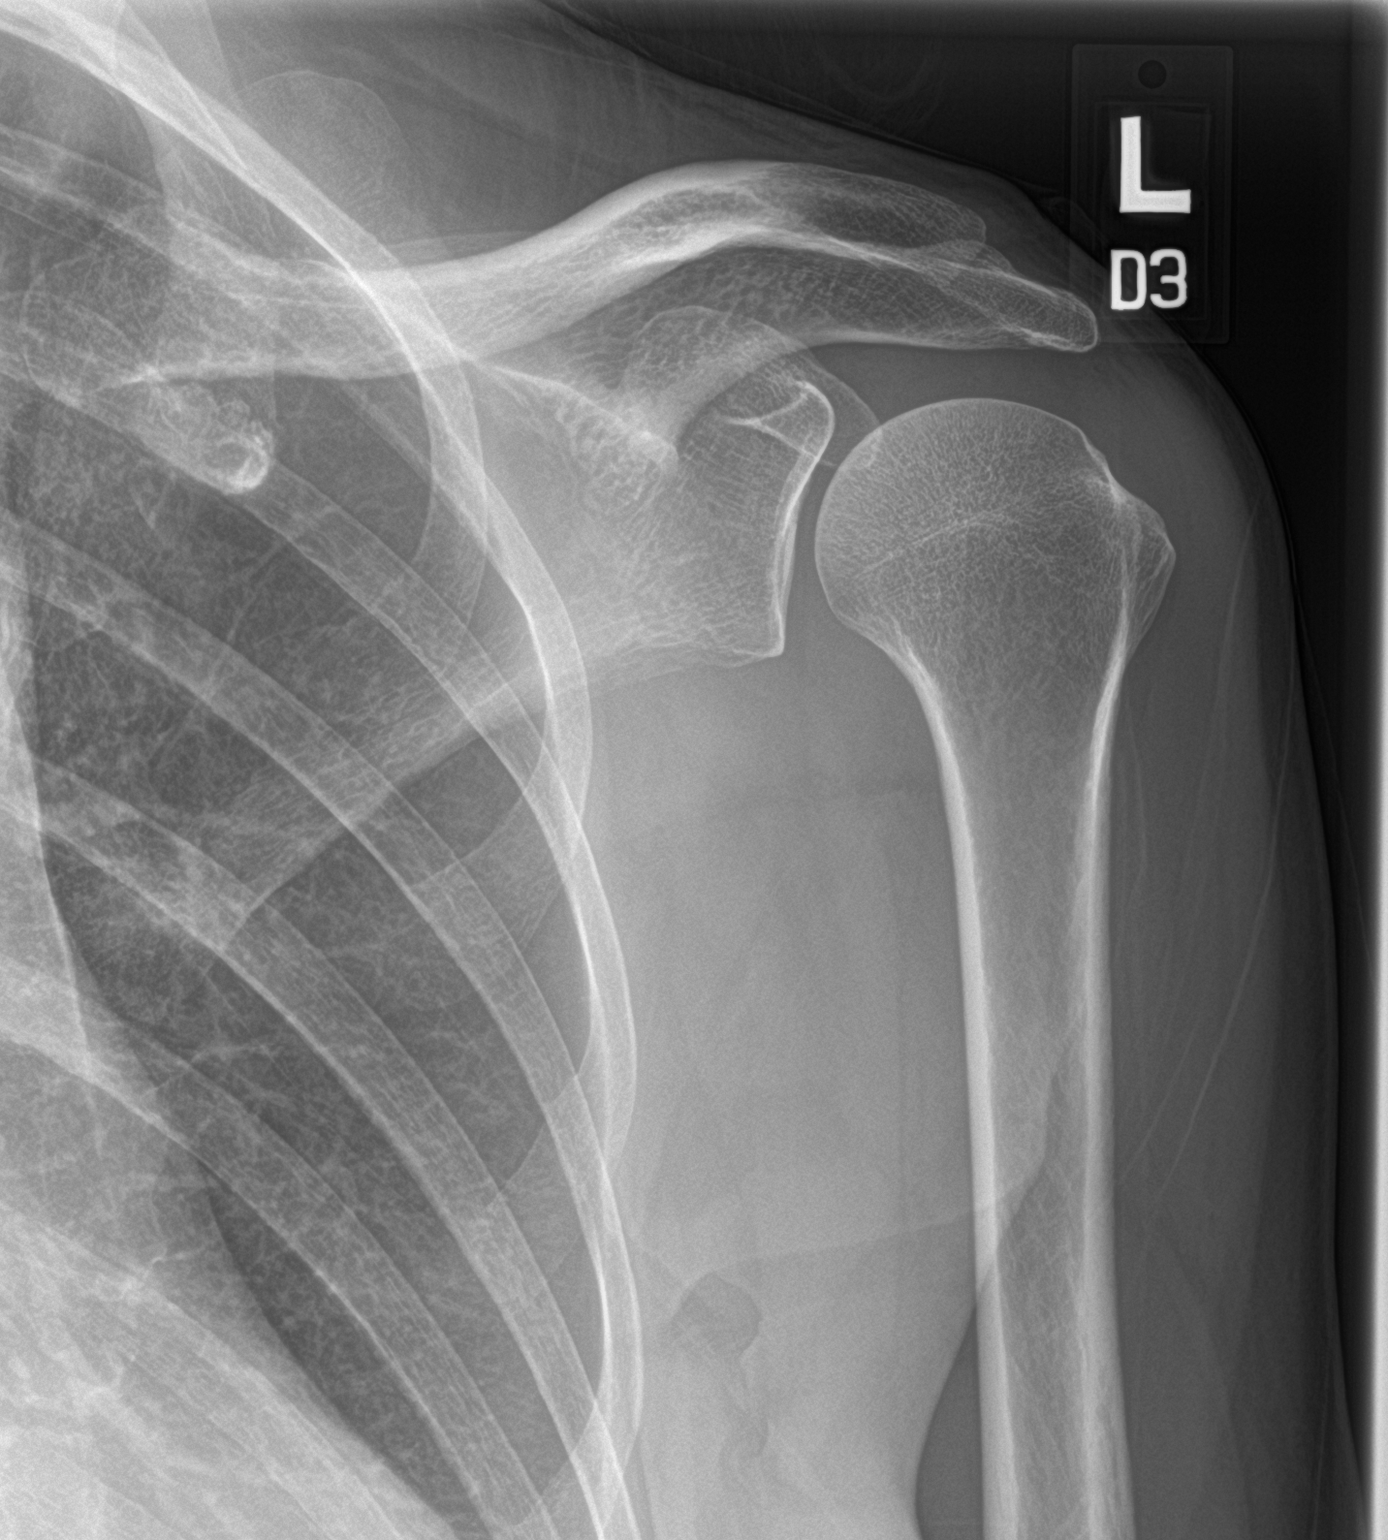
[im 2/3]
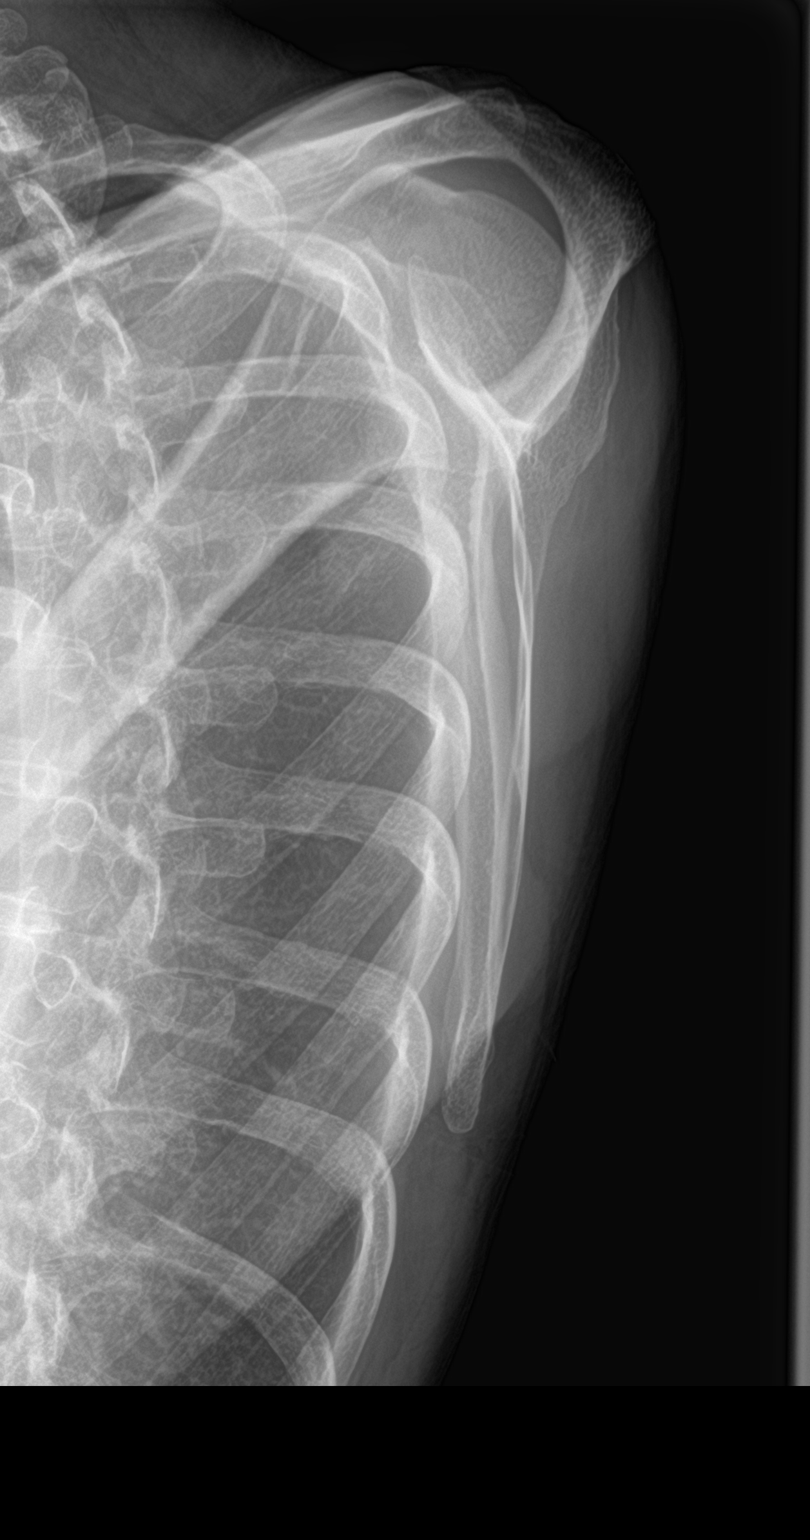
[im 3/3]
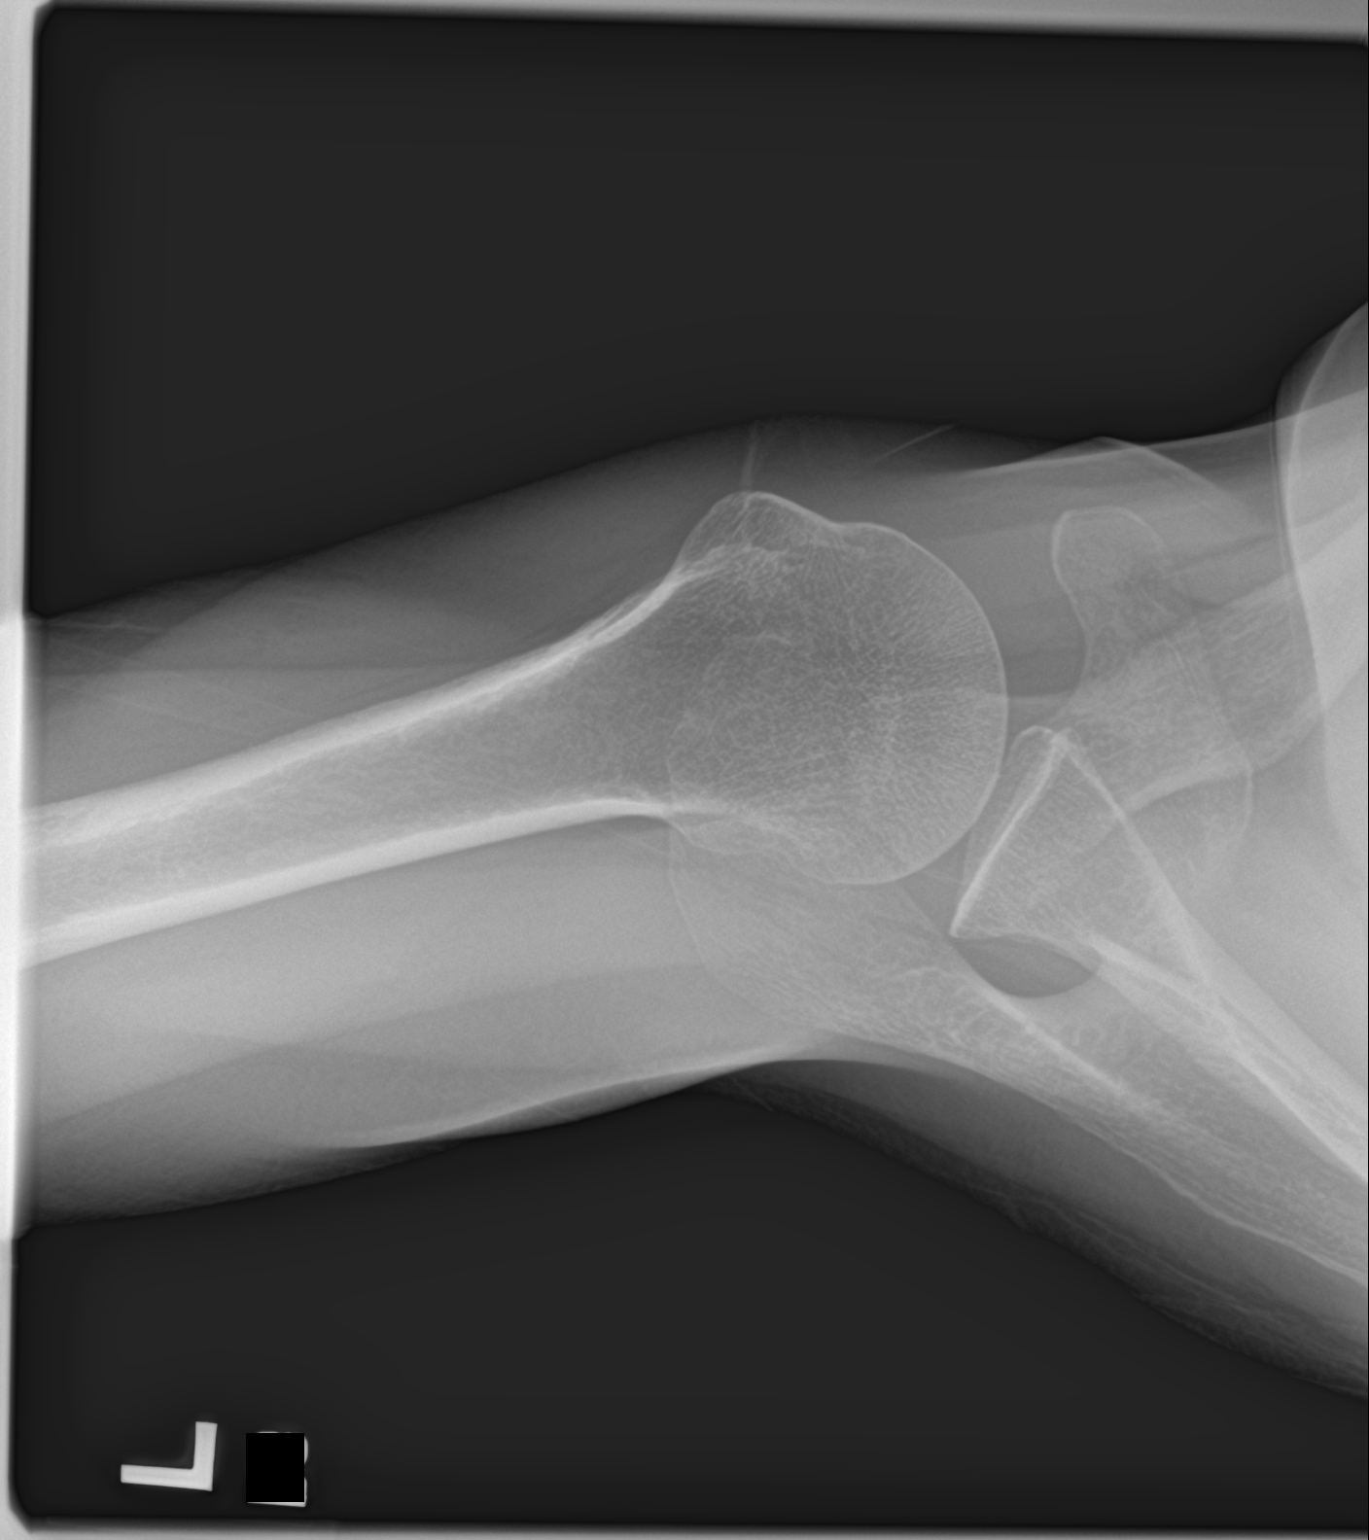

[3 of 3 positions shown; findings below may reference images not displayed]

FINDINGS: There is no evidence of fracture or dislocation. There is no
evidence of arthropathy or other focal bone abnormality. Soft
tissues are unremarkable.
IMPRESSION: Negative.

## 2021-10-28 LAB — COMPLIANCE DRUG ANALYSIS, UR

## 2021-10-29 ENCOUNTER — Other Ambulatory Visit: Payer: Self-pay

## 2021-10-29 ENCOUNTER — Ambulatory Visit: Payer: BLUE CROSS/BLUE SHIELD

## 2021-10-29 DIAGNOSIS — M546 Pain in thoracic spine: Secondary | ICD-10-CM

## 2021-10-29 DIAGNOSIS — M542 Cervicalgia: Secondary | ICD-10-CM

## 2021-10-29 DIAGNOSIS — R293 Abnormal posture: Secondary | ICD-10-CM

## 2021-10-29 NOTE — Therapy (Signed)
[facility] [address], [place], [address] Phone: [phone]   Fax:  [phone]  Physical Therapy Treatment  Patient Details  Name: April Raymond MRN: [id number] Date of Birth: April 23, 1961 Referring Provider (PT): Melodie Bouillon MD   Encounter Date: 10/29/2021   PT End of Session - 10/29/21 1506     Visit Number 4    Number of Visits 17    Date for PT Re-Evaluation 12/10/21    PT Start Time 7673    PT Stop Time 1546    PT Time Calculation (min) 40 min    Activity Tolerance Patient tolerated treatment well    Behavior During Therapy [facility] for tasks assessed/performed             Past Medical History:  Diagnosis Date   Alpha-1-antitrypsin deficiency carrier 02/20/2021   MZ phenotype   Anginal pain ([place])    CAD (coronary artery disease)    a. 2017 s/p prior MI & PCI RCA ([place]); b. 05/2020 Abnl ETT: 1.5-58mm horizontal ST dep in inflat leads @ peak stress w/ freq PVCs/couplets/bigeminy. HTN response; b. 05/2020 NSTEMI/Cath: LM nl, LAD 74m, LCX 20, RCA 35ost, patent prev placed prox-dist stent. EF 25-35% w/ apical ballooning.   COPD (chronic obstructive pulmonary disease) (HCC)    Hashimoto's disease    Hidradenitis suppurativa    Hydradenitis    Hyperparathyroidism ([place])    Hypertension    Myocardial infarction ([place]) 2017   Pericardial effusion    a. 05/2020 Echo: sm to mod circumferential peric eff w/o tamponade; b. 07/2020 Mod peric effusion; c. 09/2020 Echo: EF 60-65%, no rwma, small tomod effusion w/o compromise.   Takotsubo cardiomyopathy    a. 05/2020 Echo: EF 35-40%, glob HK, though basal wall motion best preserved consistent w/ stress induced CM. Gr1 DD. Nl RV size/fxn. Sm to mod circumferential pericardial effusion w/o tamponade.    Past Surgical History:  Procedure Laterality Date   BREAST BIOPSY Right 05/04/2021   stereotaic biopsy x clip   BREAST CYST ASPIRATION Right 05/01/2021    BREAST EXCISIONAL BIOPSY Left    age 19's   CARDIAC CATHETERIZATION     CHEST WALL RECONSTRUCTION Right 05/09/2021   Procedure: CHEST WALL MASS RESECTION;  Surgeon: [name], MD;  Location: [place];  Service: Thoracic;  Laterality: Right;  posterior   CYST EXCISION  x2   LEFT HEART CATH AND CORONARY ANGIOGRAPHY N/A 05/31/2020   Procedure: LEFT HEART CATH AND CORONARY ANGIOGRAPHY;  Surgeon: Nelva Bush, MD;  Location: [place] [facility];  Service: Cardiovascular;  Laterality: N/A;   PARATHYROIDECTOMY     TONSILLECTOMY      There were no vitals filed for this visit.   Subjective Assessment - 10/29/21 1507     Subjective Neck feels pretty good, about 1-2/10 currently. The area of surgery is fine, no pain there currently.    Pertinent History Pt is a 61 y.o. female referred to OP PT due to thoracic back pain s/p Lipoma removal on 05/09/21 on R side since after surgery. PMH inlcudes: CAD, COPD, Hashimoto's disease, HTN, takotsubo cardiomyopathy.  Pt's pain is described as aching and burning with what she describes as back spasms since heoperation. Worst pain reported as a 5/10 NPS lately, but has been up to a 9/10 around 2 months ago. Currently pain at 4-5/10 NPS. Pain located between B shoulder blades, upper traps, and cervical paraspinals. Does report B referring pain to  lateral aspects of upper arms at deltoids, denies N/T througohut UE's. Throughout 24 hours, neck pain wakes pt up peridocially. Back becomes worse as day goes on with activities such as yard work such as Freight forwarder, any global activity such as walking on treadmill. Pain is improved with heat, pain meds but has stopped taking pain meds. Pt denies B/B changes, significant weight loss, unrelenting night pain. Pt's goal is to improve pain and get back to exercising daily.    Limitations Sitting;House hold activities;Lifting;Standing;Walking    How long can you sit comfortably? 45 minutes    How long can you stand  comfortably? 45 minutes    How long can you walk comfortably? 30 minutes    Patient Stated Goals Improve pain    Currently in Pain? Yes    Pain Score 2     Pain Onset More than a month ago                                        PT Education - 10/29/21 1523     Education Details ther-ex    Person(s) Educated Patient    Methods Explanation;Demonstration;Tactile cues;Verbal cues    Comprehension Returned demonstration;Verbalized understanding             There.ex:             Seated Scapular retraction with manual resistance targeting lower trap muscle             R 10x5 seconds for 3 sets  L 10x5 seconds for 3 sets              Decreased R and L upper trap muscle tension palpated.   Seated scapular depression isometrics  R 10x5 seconds for 3 sets  L 10x5 seconds for 3 sets  Seated R first rib stretch with PT manual stabilization R first rib 15 seconds x 5   Seated B scapular retraction with R and L cervical rotation.    Seated trunk side bend            R: feels good. 10x5 seconds for 2 sets performed. Cues to not shrug shoulders.                         Seated chin tucks 10x5 seconds for 3 sets       Improved exercise technique, movement at target joints, use of target muscles after mod verbal, visual, tactile cues.        Manual Therapy             seated STM to R and L upper trap muscle to decrease tension             Tense R  > L upper trap muscle palpated         Response to treatment Fair tolerance to today's session. Decresaed R upper trap tighness with scapular retraction      Clinical impression Good carry over of decreased neck and thoracic pain from previous session based on subjective reports. Continued working on improving lower trap strength to decrease muscle tension to B upper traps. Also worked on improving thoracic extension to decrease stress to lower cervical spine. Fair tolerance to today's session. Pt  will benefit from continued skilled physical therapy services to decrease pain, improve strength and function.       PT Short  Term Goals - 10/16/21 0815       PT SHORT TERM GOAL #1   Title Pt will be independent with HEP to improve pain and cervical AROM for ADL completion    Baseline 10/15/21: initiated    Time 4    Period Weeks    Status New    Target Date 11/12/21               PT Long Term Goals - 10/16/21 0816       PT LONG TERM GOAL #1   Title Pt will improve cervical AROM in extension and rotation by 10 deg to demonstrate clinically significant improvement in mobility.    Baseline 10/15/21: extension 32 deg, R/L rotation 60 deg/60 deg    Time 8    Period Weeks    Status New    Target Date 12/10/21      PT LONG TERM GOAL #2   Title Pt will report < 2/10 pain via NPRS with exercise and household task completion to demonstrate clinically significant improvement in pain.    Baseline 10/15/21: 5/10 NPRS    Time 8    Period Weeks    Status New    Target Date 12/11/21      PT LONG TERM GOAL #3   Title Pt will improve FOTO to target score of 68 to demonstrate clinically significant improvement in functional mobility.    Baseline 10/15/21: 53    Time 8    Period Weeks    Status New    Target Date 12/10/21                   Plan - 10/29/21 1559     Clinical Impression Statement Good carry over of decreased neck and thoracic pain from previous session based on subjective reports. Continued working on improving lower trap strength to decrease muscle tension to B upper traps. Also worked on improving thoracic extension to decrease stress to lower cervical spine. Fair tolerance to today's session. Pt will benefit from continued skilled physical therapy services to decrease pain, improve strength and function.    Personal Factors and Comorbidities Age;Comorbidity 3+;Fitness;Time since onset of injury/illness/exacerbation    Comorbidities PMH inlcudes: CAD, COPD,  Hashimoto's disease, HTN, takotsubo cardiomyopathy.    Examination-Activity Limitations Sleep;Sit;Bend;Carry;Stand;Lift;Locomotion Level    Examination-Participation Restrictions Community Activity;Yard Work    Stability/Clinical Decision Making Stable/Uncomplicated    Rehab Potential Good    PT Frequency 2x / week    PT Duration 8 weeks    PT Treatment/Interventions ADLs/Self Care Home Management;Cryotherapy;Electrical Stimulation;Moist Heat;Traction;Functional mobility training;Therapeutic activities;Therapeutic exercise;Neuromuscular re-education;Patient/family education;Manual techniques;Dry needling;Passive range of motion;Spinal Manipulations;Joint Manipulations    PT Next Visit Plan Cervical/thoracic mobility, manual techniques for trigger point reduction and improved joint mobility    PT Home Exercise Plan Access Code [facility]    Consulted and Agree with Plan of Care Patient             Patient will benefit from skilled therapeutic intervention in order to improve the following deficits and impairments:  Hypomobility, Pain, Increased muscle spasms, Decreased range of motion  Visit Diagnosis: Pain in thoracic spine  Abnormal posture  Cervicalgia     Problem List Patient Active Problem List   Diagnosis Date Noted   Chronic upper back pain (1ry area of Pain) (Right) 10/22/2021   Chronic thoracic back pain (Bilateral) (R>L) 10/22/2021   Chronic neck pain (2ry area of Pain) (Bilateral) (R>L) 10/22/2021   Chronic shoulder pain (3ry  area of Pain) (Bilateral) 10/22/2021   Painful cervical range of motion 10/22/2021   Impaired range of motion of cervical spine 10/22/2021   Spasm of thoracic back muscle 10/22/2021   CKD (chronic kidney disease) stage 3, GFR 30-59 ml/min (HCC) 09/08/2021   H/O lipoma 05/09/2021   Alpha-1-antitrypsin deficiency carrier 02/20/2021   Moderate COPD (chronic obstructive pulmonary disease) ([place]) 01/12/2021   PAD (peripheral artery disease) ([place])  09/22/2020   Nonischemic cardiomyopathy ([place]) 05/31/2020   Former smoker 09/30/2019   History of MI (myocardial infarction) 09/30/2019   History of parathyroidectomy ([place]) 09/30/2019   Coronary artery disease of native artery of native heart with stable angina pectoris ([place]) 09/30/2019   Essential hypertension 09/30/2019   Hyperlipidemia LDL goal <70 09/30/2019   Hashimoto's disease 09/30/2019   Vaginal atrophy 09/30/2019   Hidradenitis suppurativa 09/30/2019   Joneen Boers PT, DPT  10/29/2021, 4:02 PM  [facility] [address] [place], [address] Phone: [phone]   Fax:  [phone]  Name: April Raymond MRN: [id number] Date of Birth: 03-23-1961

## 2021-10-31 ENCOUNTER — Ambulatory Visit: Payer: BLUE CROSS/BLUE SHIELD

## 2021-10-31 ENCOUNTER — Other Ambulatory Visit: Payer: Self-pay

## 2021-10-31 DIAGNOSIS — M546 Pain in thoracic spine: Secondary | ICD-10-CM | POA: Diagnosis not present

## 2021-10-31 DIAGNOSIS — R293 Abnormal posture: Secondary | ICD-10-CM

## 2021-10-31 DIAGNOSIS — M542 Cervicalgia: Secondary | ICD-10-CM

## 2021-10-31 NOTE — Therapy (Signed)
[facility] [address], [place], [address] Phone: [phone]   Fax:  [phone]  Physical Therapy Treatment  Patient Details  Name: April Raymond MRN: [id number] Date of Birth: August 18, 1961 Referring Provider (PT): Melodie Bouillon MD   Encounter Date: 10/31/2021   PT End of Session - 10/31/21 1503     Visit Number 5    Number of Visits 17    Date for PT Re-Evaluation 12/10/21    PT Start Time 1503    PT Stop Time 1534    PT Time Calculation (min) 31 min    Activity Tolerance Patient tolerated treatment well    Behavior During Therapy [facility] for tasks assessed/performed             Past Medical History:  Diagnosis Date   Alpha-1-antitrypsin deficiency carrier 02/20/2021   MZ phenotype   Anginal pain ([place])    CAD (coronary artery disease)    a. 2017 s/p prior MI & PCI RCA ([place]); b. 05/2020 Abnl ETT: 1.5-45mm horizontal ST dep in inflat leads @ peak stress w/ freq PVCs/couplets/bigeminy. HTN response; b. 05/2020 NSTEMI/Cath: LM nl, LAD 48m, LCX 20, RCA 35ost, patent prev placed prox-dist stent. EF 25-35% w/ apical ballooning.   COPD (chronic obstructive pulmonary disease) (HCC)    Hashimoto's disease    Hidradenitis suppurativa    Hydradenitis    Hyperparathyroidism ([place])    Hypertension    Myocardial infarction ([place]) 2017   Pericardial effusion    a. 05/2020 Echo: sm to mod circumferential peric eff w/o tamponade; b. 07/2020 Mod peric effusion; c. 09/2020 Echo: EF 60-65%, no rwma, small tomod effusion w/o compromise.   Takotsubo cardiomyopathy    a. 05/2020 Echo: EF 35-40%, glob HK, though basal wall motion best preserved consistent w/ stress induced CM. Gr1 DD. Nl RV size/fxn. Sm to mod circumferential pericardial effusion w/o tamponade.    Past Surgical History:  Procedure Laterality Date   BREAST BIOPSY Right 05/04/2021   stereotaic biopsy x clip   BREAST CYST ASPIRATION Right 05/01/2021    BREAST EXCISIONAL BIOPSY Left    age 56's   CARDIAC CATHETERIZATION     CHEST WALL RECONSTRUCTION Right 05/09/2021   Procedure: CHEST WALL MASS RESECTION;  Surgeon: [name], MD;  Location: [place];  Service: Thoracic;  Laterality: Right;  posterior   CYST EXCISION  x2   LEFT HEART CATH AND CORONARY ANGIOGRAPHY N/A 05/31/2020   Procedure: LEFT HEART CATH AND CORONARY ANGIOGRAPHY;  Surgeon: Nelva Bush, MD;  Location: [place] [facility];  Service: Cardiovascular;  Laterality: N/A;   PARATHYROIDECTOMY     TONSILLECTOMY      There were no vitals filed for this visit.   Subjective Assessment - 10/31/21 1504     Subjective The surgical area is fine. Lower neck is sore, about a 2/10 but not as bad as it has been.    Pertinent History Pt is a 61 y.o. female referred to OP PT due to thoracic back pain s/p Lipoma removal on 05/09/21 on R side since after surgery. PMH inlcudes: CAD, COPD, Hashimoto's disease, HTN, takotsubo cardiomyopathy.  Pt's pain is described as aching and burning with what she describes as back spasms since heoperation. Worst pain reported as a 5/10 NPS lately, but has been up to a 9/10 around 2 months ago. Currently pain at 4-5/10 NPS. Pain located between B shoulder blades, upper traps, and cervical paraspinals. Does report B  referring pain to lateral aspects of upper arms at deltoids, denies N/T througohut UE's. Throughout 24 hours, neck pain wakes pt up peridocially. Back becomes worse as day goes on with activities such as yard work such as Freight forwarder, any global activity such as walking on treadmill. Pain is improved with heat, pain meds but has stopped taking pain meds. Pt denies B/B changes, significant weight loss, unrelenting night pain. Pt's goal is to improve pain and get back to exercising daily.    Limitations Sitting;House hold activities;Lifting;Standing;Walking    How long can you sit comfortably? 45 minutes    How long can you stand comfortably? 45  minutes    How long can you walk comfortably? 30 minutes    Patient Stated Goals Improve pain    Currently in Pain? Yes    Pain Score 2     Pain Onset More than a month ago                                        PT Education - 10/31/21 1736     Education Details ther-ex    Person(s) Educated Patient    Methods Explanation;Demonstration;Tactile cues;Verbal cues    Comprehension Returned demonstration;Verbalized understanding            There.ex:             Seated Scapular retraction with manual resistance targeting lower trap muscle             R 10x5 seconds for 3 sets             L 10x5 seconds for 3 sets    Seated R first rib stretch with PT manual stabilization R first rib 15 seconds x 5   Standing R trunk side bend 10x5 seconds   Lower trap raise at wall  R 10x3  L 10x3   Improved exercise technique, movement at target joints, use of target muscles after min to mod verbal, visual, tactile cues.        Manual Therapy             seated STM to R and L upper trap muscle to decrease tension                  Response to treatment No neck soreness after session.        Clinical impression Decreasing muscle tension palpated B upper trap area. Continued working on improving lower trap activation and strength to continue progress. No neck soreness after session. Pt will benefit from continued skilled physical therapy services to decrease pain, improve strength and function.      PT Short Term Goals - 10/16/21 0815       PT SHORT TERM GOAL #1   Title Pt will be independent with HEP to improve pain and cervical AROM for ADL completion    Baseline 10/15/21: initiated    Time 4    Period Weeks    Status New    Target Date 11/12/21               PT Long Term Goals - 10/16/21 0816       PT LONG TERM GOAL #1   Title Pt will improve cervical AROM in extension and rotation by 10 deg to demonstrate clinically significant  improvement in mobility.    Baseline 10/15/21: extension 32 deg, R/L rotation 60  deg/60 deg    Time 8    Period Weeks    Status New    Target Date 12/10/21      PT LONG TERM GOAL #2   Title Pt will report < 2/10 pain via NPRS with exercise and household task completion to demonstrate clinically significant improvement in pain.    Baseline 10/15/21: 5/10 NPRS    Time 8    Period Weeks    Status New    Target Date 12/11/21      PT LONG TERM GOAL #3   Title Pt will improve FOTO to target score of 68 to demonstrate clinically significant improvement in functional mobility.    Baseline 10/15/21: 53    Time 8    Period Weeks    Status New    Target Date 12/10/21                   Plan - 10/31/21 1736     Clinical Impression Statement Decreasing muscle tension palpated B upper trap area. Continued working on improving lower trap activation and strength to continue progress. No neck soreness after session. Pt will benefit from continued skilled physical therapy services to decrease pain, improve strength and function.    Personal Factors and Comorbidities Age;Comorbidity 3+;Fitness;Time since onset of injury/illness/exacerbation    Comorbidities PMH inlcudes: CAD, COPD, Hashimoto's disease, HTN, takotsubo cardiomyopathy.    Examination-Activity Limitations Sleep;Sit;Bend;Carry;Stand;Lift;Locomotion Level    Examination-Participation Restrictions Community Activity;Yard Work    Stability/Clinical Decision Making Stable/Uncomplicated    Rehab Potential Good    PT Frequency 2x / week    PT Duration 8 weeks    PT Treatment/Interventions ADLs/Self Care Home Management;Cryotherapy;Electrical Stimulation;Moist Heat;Traction;Functional mobility training;Therapeutic activities;Therapeutic exercise;Neuromuscular re-education;Patient/family education;Manual techniques;Dry needling;Passive range of motion;Spinal Manipulations;Joint Manipulations    PT Next Visit Plan Cervical/thoracic  mobility, manual techniques for trigger point reduction and improved joint mobility    PT Home Exercise Plan Access Code [facility]    Consulted and Agree with Plan of Care Patient             Patient will benefit from skilled therapeutic intervention in order to improve the following deficits and impairments:  Hypomobility, Pain, Increased muscle spasms, Decreased range of motion  Visit Diagnosis: Pain in thoracic spine  Abnormal posture  Cervicalgia     Problem List Patient Active Problem List   Diagnosis Date Noted   Chronic upper back pain (1ry area of Pain) (Right) 10/22/2021   Chronic thoracic back pain (Bilateral) (R>L) 10/22/2021   Chronic neck pain (2ry area of Pain) (Bilateral) (R>L) 10/22/2021   Chronic shoulder pain (3ry area of Pain) (Bilateral) 10/22/2021   Painful cervical range of motion 10/22/2021   Impaired range of motion of cervical spine 10/22/2021   Spasm of thoracic back muscle 10/22/2021   CKD (chronic kidney disease) stage 3, GFR 30-59 ml/min (HCC) 09/08/2021   H/O lipoma 05/09/2021   Alpha-1-antitrypsin deficiency carrier 02/20/2021   Moderate COPD (chronic obstructive pulmonary disease) ([place]) 01/12/2021   PAD (peripheral artery disease) ([place]) 09/22/2020   Nonischemic cardiomyopathy ([place]) 05/31/2020   Former smoker 09/30/2019   History of MI (myocardial infarction) 09/30/2019   History of parathyroidectomy ([place]) 09/30/2019   Coronary artery disease of native artery of native heart with stable angina pectoris ([place]) 09/30/2019   Essential hypertension 09/30/2019   Hyperlipidemia LDL goal <70 09/30/2019   Hashimoto's disease 09/30/2019   Vaginal atrophy 09/30/2019   Hidradenitis suppurativa 09/30/2019   Joneen Boers PT, DPT  10/31/2021, 5:39 PM  [facility] [address], [place], [address] Phone: [phone]   Fax:  [phone]  Name: COZY VEALE MRN:  [id number] Date of Birth: [birth year]

## 2021-11-01 ENCOUNTER — Ambulatory Visit
Admission: RE | Admit: 2021-11-01 | Discharge: 2021-11-01 | Disposition: A | Discharge status: Home / Self Care | Payer: BLUE CROSS/BLUE SHIELD | Source: Ambulatory Visit | Attending: Pain Medicine | Admitting: Pain Medicine

## 2021-11-01 DIAGNOSIS — G8929 Other chronic pain: Secondary | ICD-10-CM | POA: Insufficient documentation

## 2021-11-01 DIAGNOSIS — M546 Pain in thoracic spine: Secondary | ICD-10-CM | POA: Insufficient documentation

## 2021-11-01 DIAGNOSIS — M5134 Other intervertebral disc degeneration, thoracic region: Secondary | ICD-10-CM | POA: Diagnosis present

## 2021-11-01 DIAGNOSIS — M549 Dorsalgia, unspecified: Secondary | ICD-10-CM | POA: Diagnosis not present

## 2021-11-01 DIAGNOSIS — M62830 Muscle spasm of back: Secondary | ICD-10-CM | POA: Diagnosis present

## 2021-11-01 IMAGING — CT CT T SPINE W/O CM
3 of 5 series · 10 of 33 positions shown, 12 images · non-contrast
Comparison: MRI right chest [DATE]

CLINICAL DATA: Mid back pain. History of lipoma removal right back
5 months ago.

EXAM:
CT THORACIC SPINE WITHOUT CONTRAST
TECHNIQUE: Multidetector CT images of the thoracic were obtained using the
standard protocol without intravenous contrast.
RADIATION DOSE REDUCTION: This exam was performed according to the
departmental dose-optimization program which includes automated
exposure control, adjustment of the mA and/or kV according to
patient size and/or use of iterative reconstruction technique.

[Series 4: t-spine st t-spine 2.00 · axial · 0.35mm/px · z∈[-993,-829]mm · 2 of 166 slices shown, 3 images]
[im 42/166  soft-tissue]
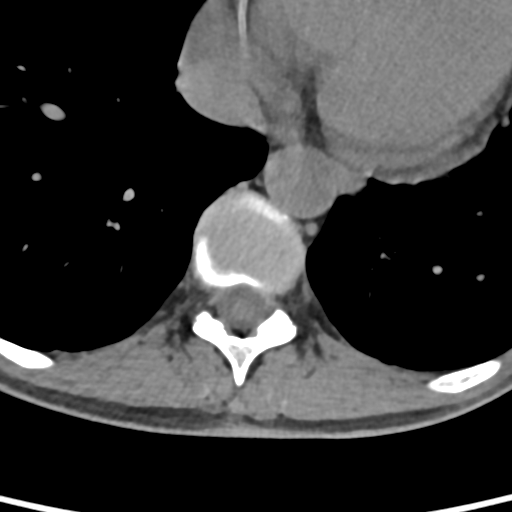
[im 42/166  bone]
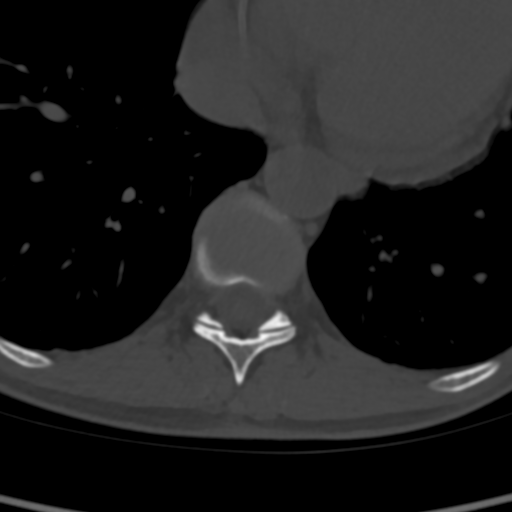
[im 124/166  bone]
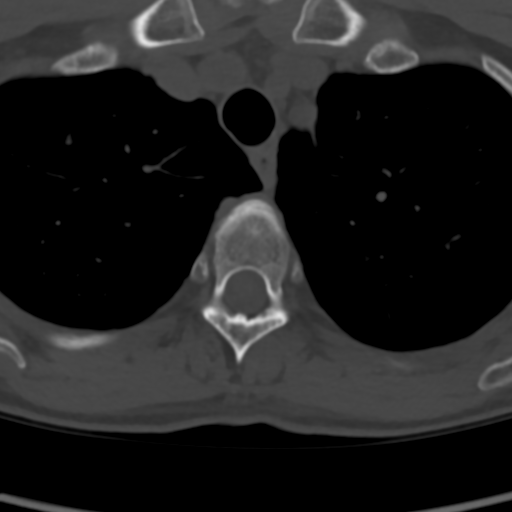

[Series 6: sag bone t-spine 2.00 sag · sagittal · 0.35mm/px · 5 of 90 slices shown, 6 images]
[im 30/90  bone]
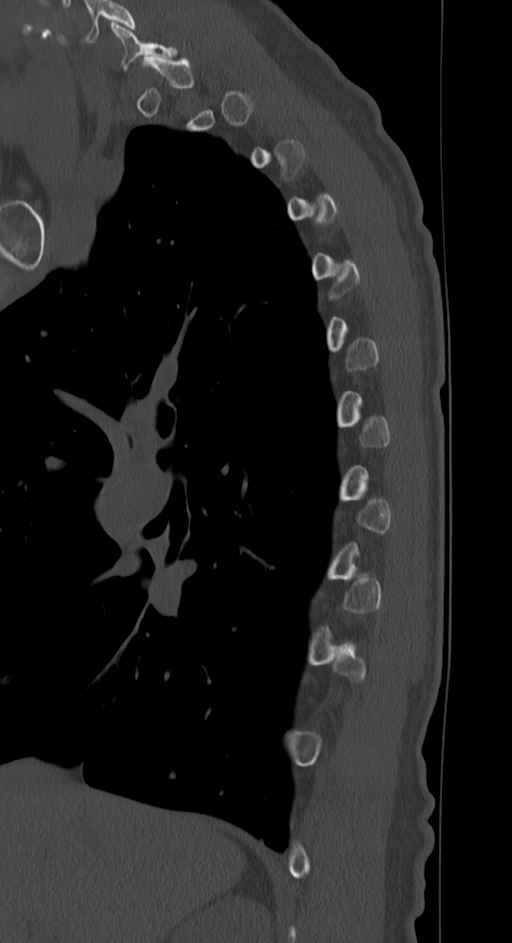
[im 38/90  bone]
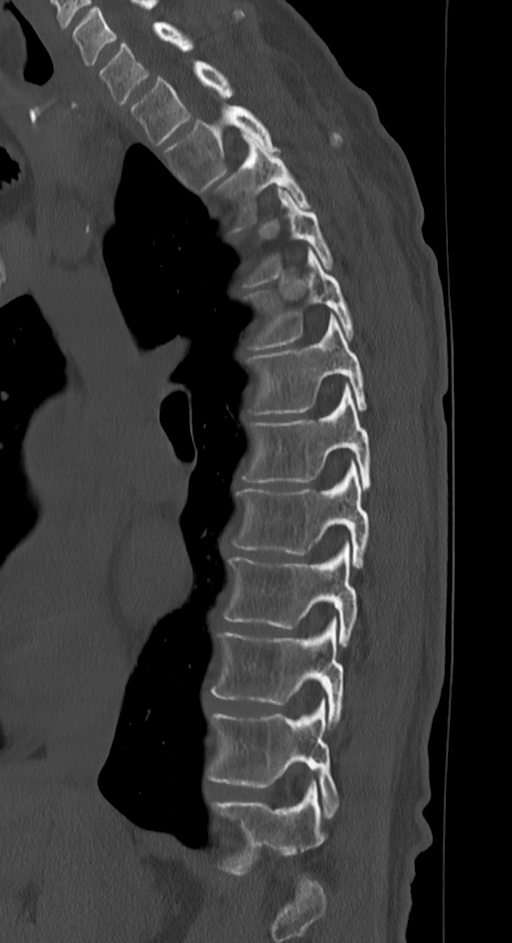
[im 45/90  soft-tissue]
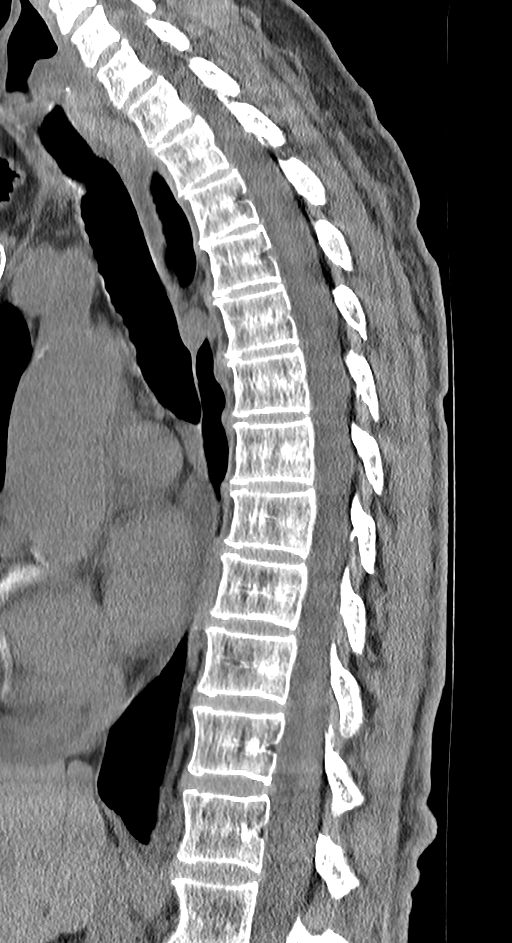
[im 45/90  bone]
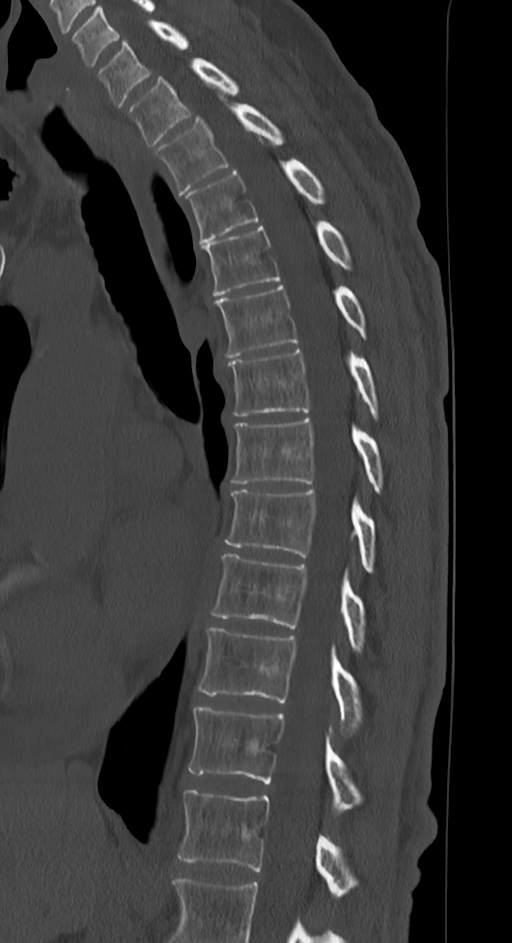
[im 52/90  bone]
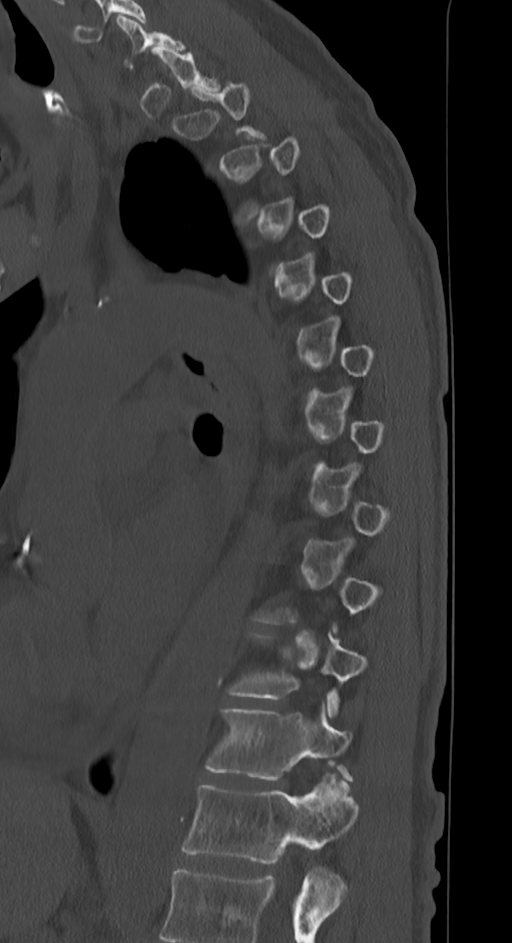
[im 60/90  bone]
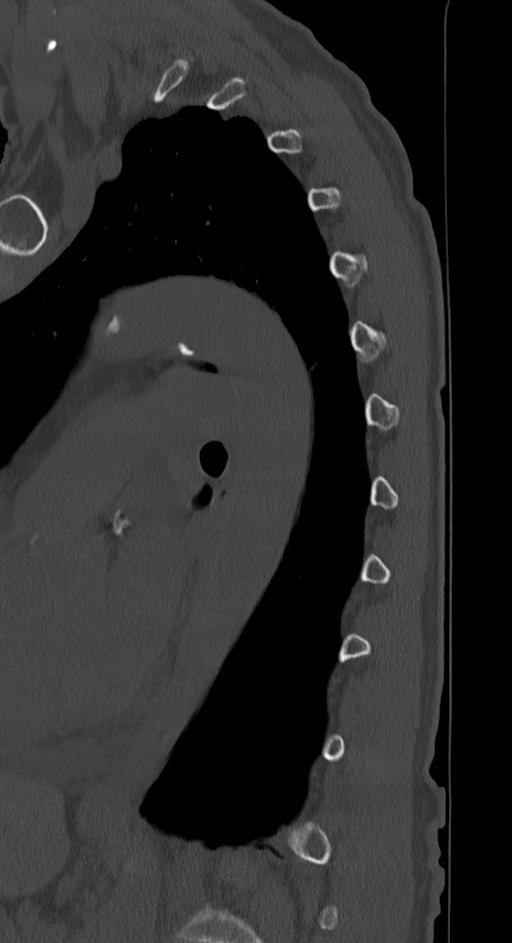

[Series 8: cor bone t-spine 2.00 cor · coronal · 0.35mm/px · 3 of 90 slices shown]
[im 18/90  bone]
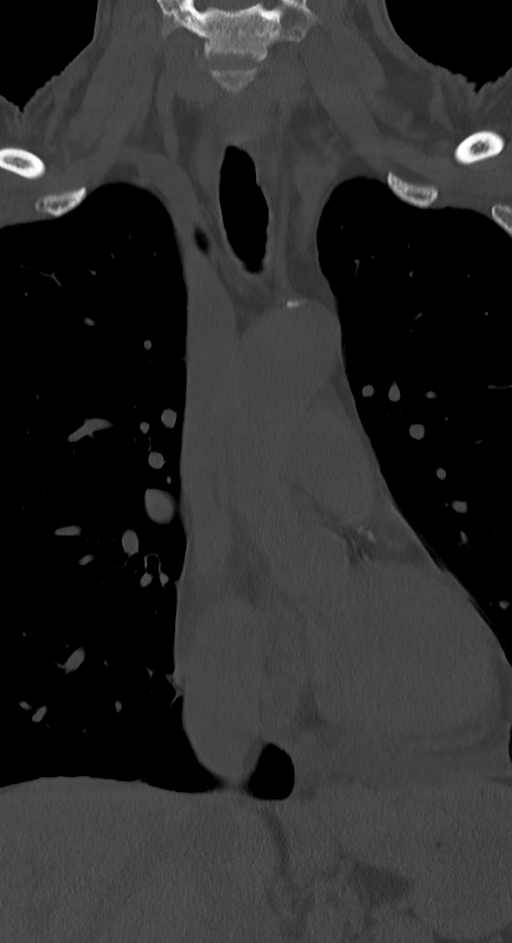
[im 36/90  bone]
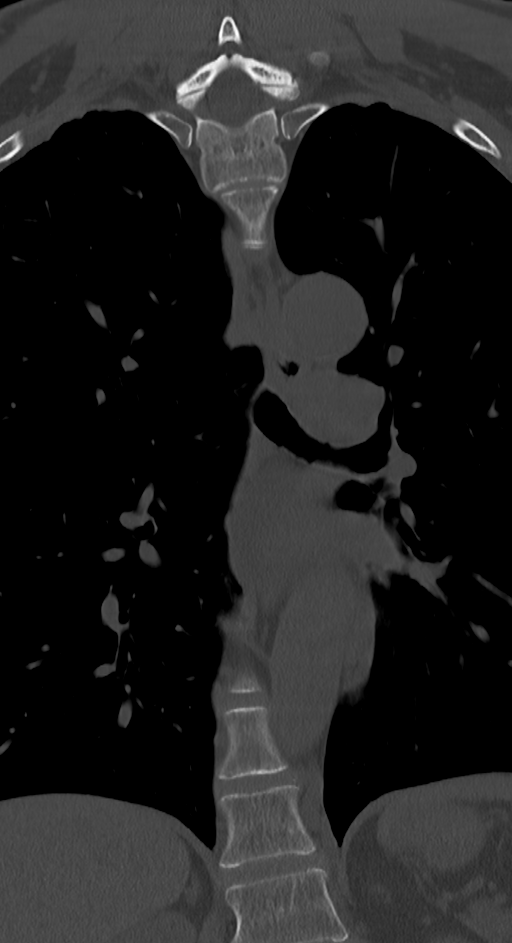
[im 54/90  bone]
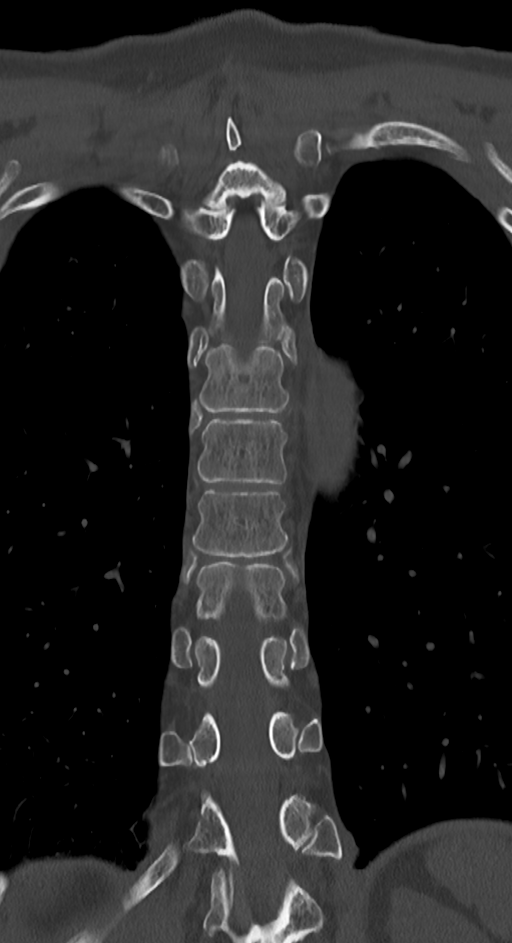

[10 of 33 positions shown; findings below may reference images not displayed]

FINDINGS: Alignment: Mild anterolisthesis C7-T1 and T2-3.

Vertebrae: Negative for fracture or mass.  No rib lesion identified.

Paraspinal and other soft tissues: No paraspinous mass or fluid
collection. No residual lipoma is present in the right posterior
chest wall. Visualized lungs are clear.

Disc levels: Disc spaces maintained throughout the thoracic spine.
No disc degeneration or disc protrusion. No spinal stenosis
identified.
IMPRESSION: No acute skeletal abnormality.

Negative for thoracic stenosis

Post resection of right chest wall lipoma without evidence of mass,
lipoma, or fluid collection

## 2021-11-05 ENCOUNTER — Other Ambulatory Visit: Payer: Self-pay

## 2021-11-05 ENCOUNTER — Ambulatory Visit
Admission: RE | Admit: 2021-11-05 | Discharge: 2021-11-05 | Disposition: A | Discharge status: Home / Self Care | Payer: BLUE CROSS/BLUE SHIELD | Source: Ambulatory Visit | Attending: Nurse Practitioner | Admitting: Nurse Practitioner

## 2021-11-05 DIAGNOSIS — R928 Other abnormal and inconclusive findings on diagnostic imaging of breast: Secondary | ICD-10-CM

## 2021-11-05 IMAGING — MG MM DIGITAL DIAGNOSTIC UNILAT*R* W/ TOMO W/ CAD
4 series · 4 of 12 positions shown · non-contrast
Comparison: Previous exam(s).
COMPARISON: Previous exam(s).

Addendum:
CLINICAL DATA: Short-term interval follow-up following biopsy of
the right breast on [DATE]. The patient had a stereotactic
biopsy because there was no sonographic correlate. Pathology showed
benign breast tissue with mild stromal fibrosis and hemorrhage.

EXAM:
DIGITAL DIAGNOSTIC UNILATERAL RIGHT MAMMOGRAM WITH TOMOSYNTHESIS AND
CAD
TECHNIQUE: Right digital diagnostic mammography and breast tomosynthesis was
performed. The images were evaluated with computer-aided detection.

[R CC synth-2D]
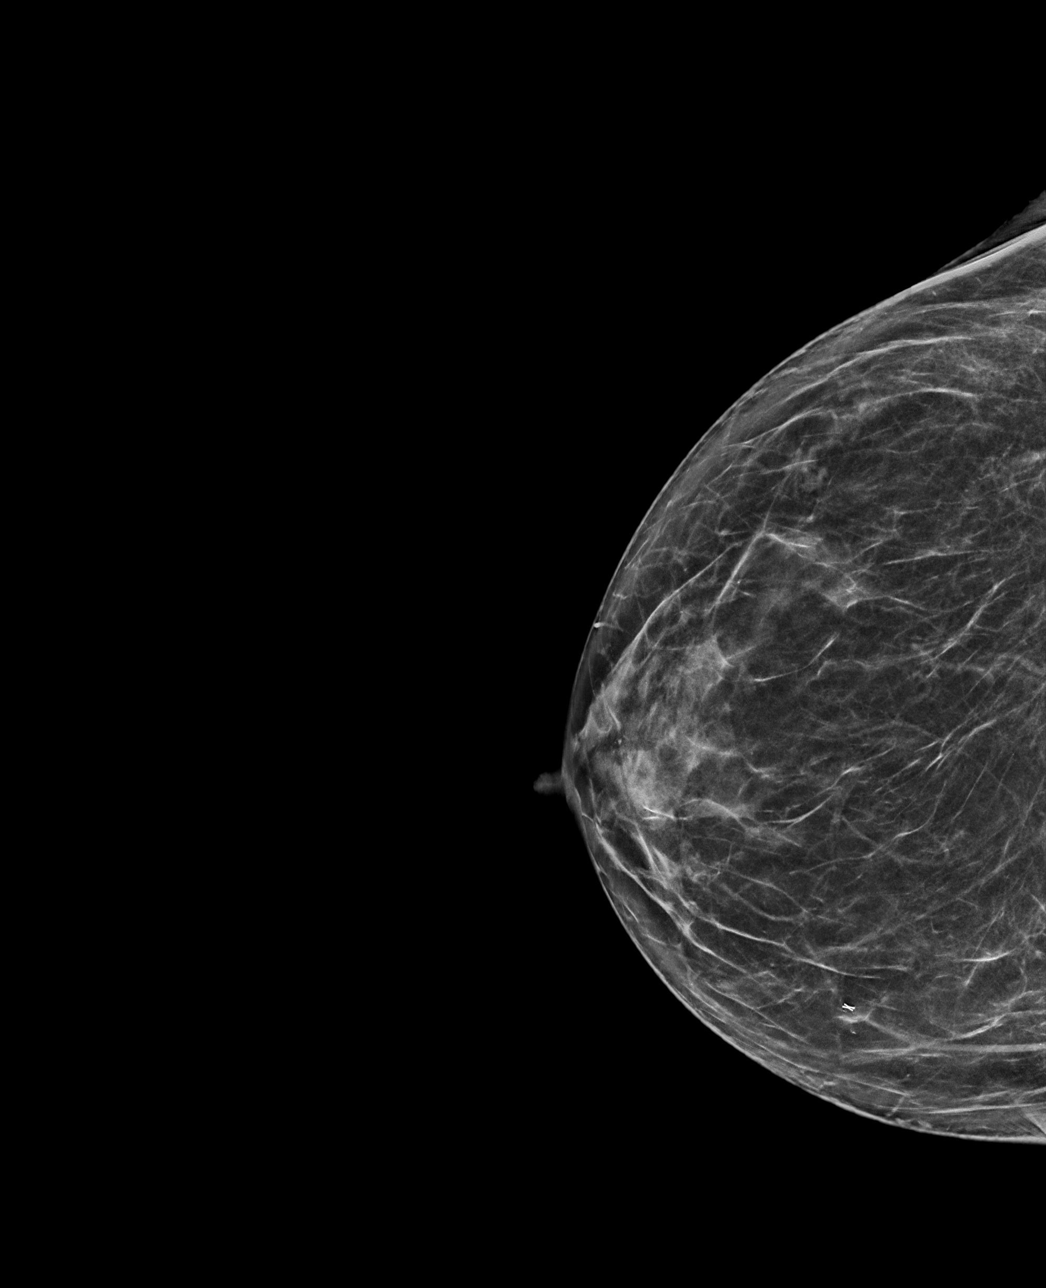

[R MLO synth-2D]
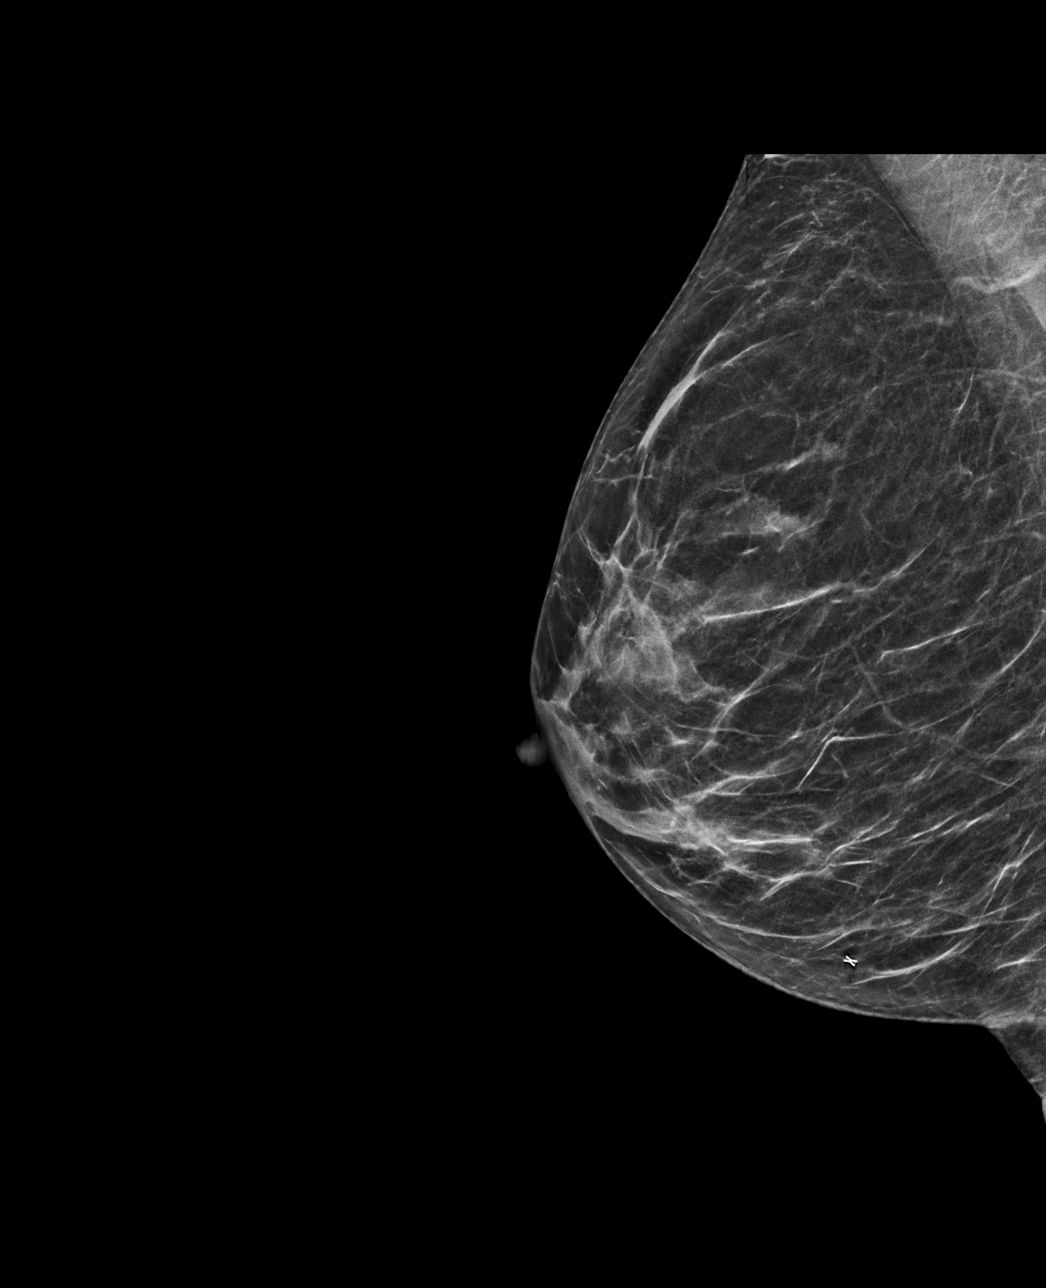

[R CC tomo · tomo slice 31/61.0]
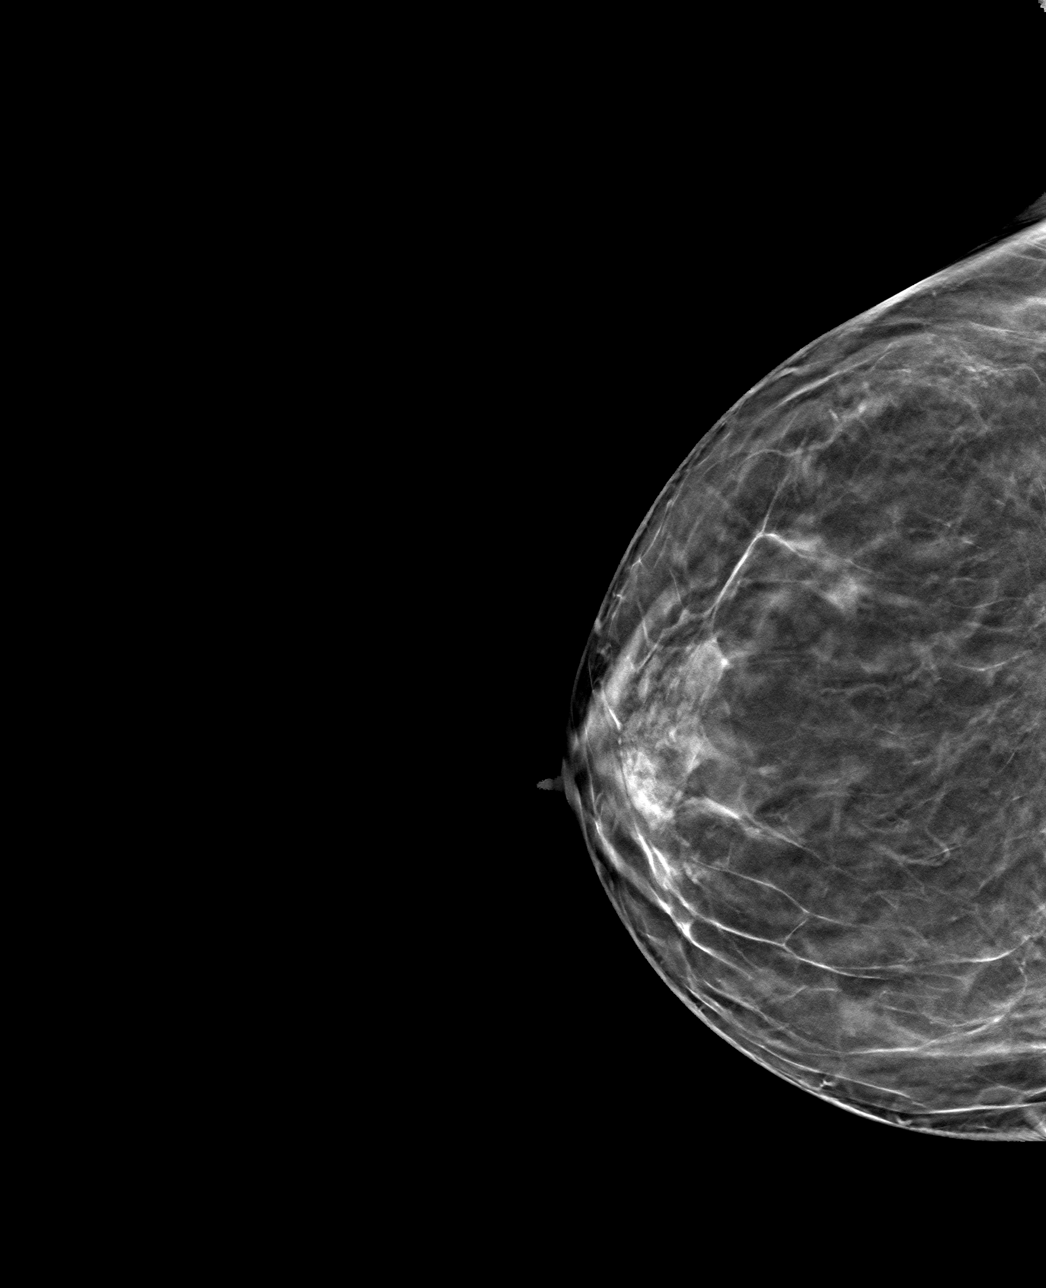

[R MLO tomo · tomo slice 28/55.0]
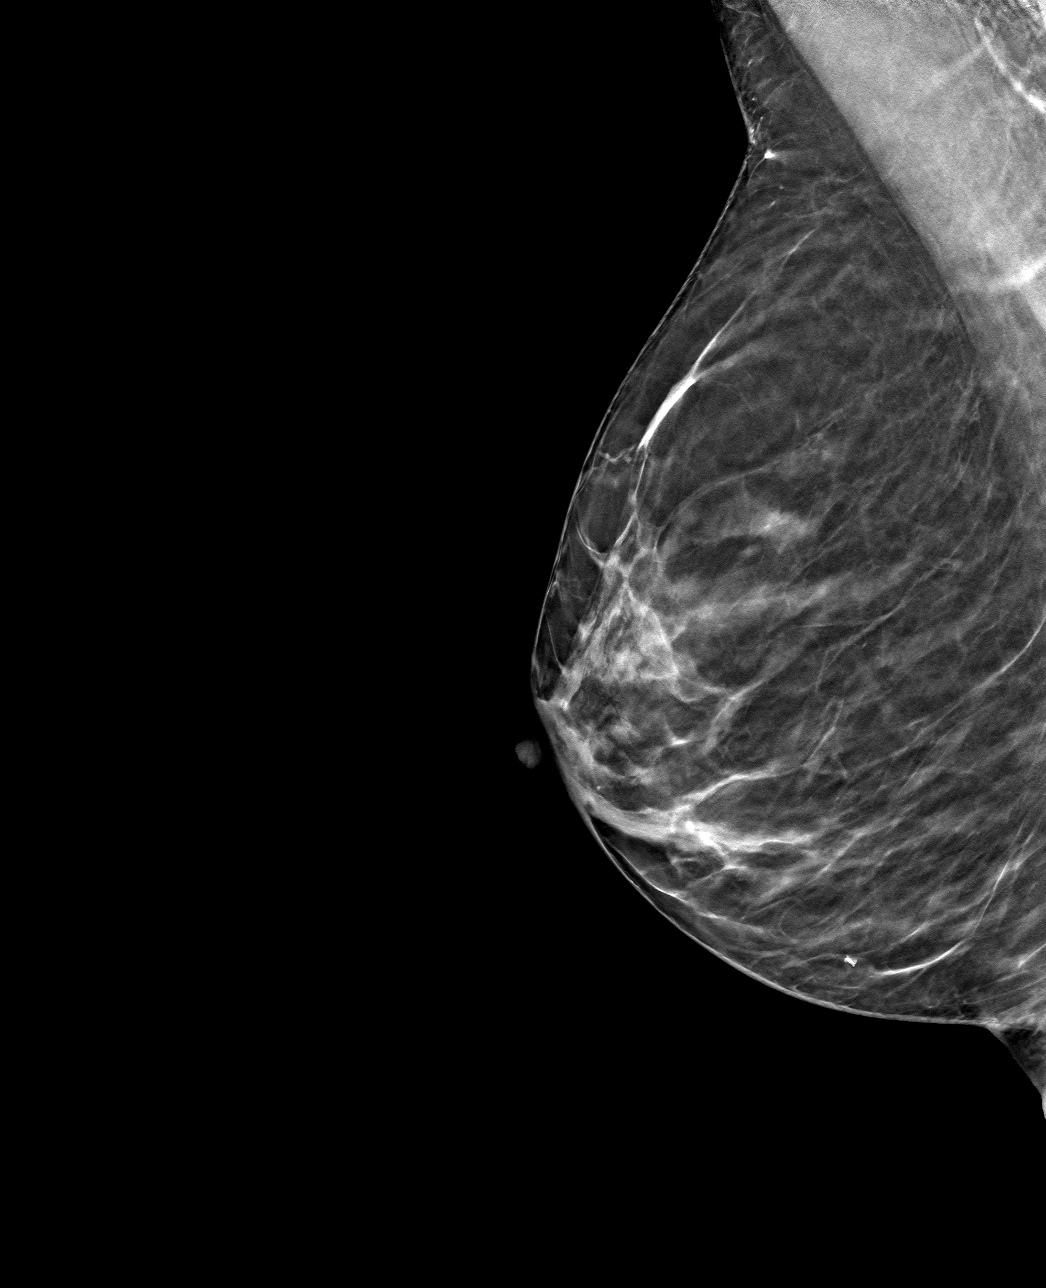

[4 of 12 positions shown; findings below may reference images not displayed]

ACR Breast Density Category b: There are scattered areas of
fibroglandular density.
FINDINGS: There is an X shaped clip in the lower-inner quadrant of the right
breast. The clip is in appropriate position. No suspicious mass or
malignant type microcalcifications identified.
IMPRESSION: Stable appearance of the right breast.

RECOMMENDATION:
Bilateral diagnostic mammogram in [DATE] is recommended to
document stability for 1 year.

I have discussed the findings and recommendations with the patient.
If applicable, a reminder letter will be sent to the patient
regarding the next appointment.

BI-RADS CATEGORY  3: Probably benign.

ADDENDUM:
This is an addendum to a diagnostic mammogram performed on
[DATE].

Recommendation: Bilateral diagnostic mammogram in [DATE] is
recommended to document stability of the right breast for 1 year.

*** End of Addendum ***
ACR Breast Density Category b: There are scattered areas of
fibroglandular density.
FINDINGS: There is an X shaped clip in the lower-inner quadrant of the right
breast. The clip is in appropriate position. No suspicious mass or
malignant type microcalcifications identified.
IMPRESSION: Stable appearance of the right breast.

RECOMMENDATION:
Bilateral diagnostic mammogram in [DATE] is recommended to
document stability for 1 year.

I have discussed the findings and recommendations with the patient.
If applicable, a reminder letter will be sent to the patient
regarding the next appointment.

BI-RADS CATEGORY  3: Probably benign.

## 2021-11-05 NOTE — Progress Notes (Signed)
Follow-up Outpatient Visit Date: 11/07/2021  Primary Care Provider: [place] [id number]  Chief Complaint: Follow-up coronary artery disease and stress-induced cardiomyopathy  HPI:  April Raymond is a 61 y.o. 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  I last saw her in 04/2021, at which time she was doing well from a heart standpoint.  She was undergoing workup of right breast mass/cyst.  We did not make any medication changes or pursue additional testing.  Today, April Raymond only concern is of intermittent dry mouth.  She wonders if daily furosemide could be contributing to this.  She has otherwise been feeling fairly well other than some continued muscle spasms in her upper back and neck following surgery last year.  She continues to exercise at least 5 times a week without difficulty.  She denies chest pain, shortness of breath, palpitations, lightheadedness, and edema.  --------------------------------------------------------------------------------------------------  Past Medical History:  Diagnosis Date   Alpha-1-antitrypsin deficiency carrier 02/20/2021   MZ phenotype   Anginal pain ([place])    CAD (coronary artery disease)    a. 2017 s/p prior MI & PCI RCA ([place]); b. 05/2020 Abnl ETT: 1.5-34mm horizontal ST dep in inflat leads @ peak stress w/ freq PVCs/couplets/bigeminy. HTN response; b. 05/2020 NSTEMI/Cath: LM nl, LAD 17m, LCX 20, RCA 35ost, patent prev placed prox-dist stent. EF 25-35% w/ apical ballooning.   COPD (chronic obstructive pulmonary  disease) (HCC)    Hashimoto's disease    Hidradenitis suppurativa    Hydradenitis    Hyperparathyroidism ([place])    Hypertension    Myocardial infarction ([place]) 2017   Pericardial effusion    a. 05/2020 Echo: sm to mod circumferential peric eff w/o tamponade; b. 07/2020 Mod peric effusion; c. 09/2020 Echo: EF 60-65%, no rwma, small tomod effusion w/o compromise.   Takotsubo cardiomyopathy    a. 05/2020 Echo: EF 35-40%, glob HK, though basal wall motion best preserved consistent w/ stress induced CM. Gr1 DD. Nl RV size/fxn. Sm to mod circumferential pericardial effusion w/o tamponade.   Past Surgical History:  Procedure Laterality Date   BREAST BIOPSY Right 05/04/2021   stereo x clip BENIGN BREAST TISSUE WITH MILD STROMAL FIBROSIS AND HEMORRHAGE   BREAST CYST ASPIRATION Right 05/01/2021   BREAST EXCISIONAL BIOPSY Left    age 44's   CARDIAC CATHETERIZATION     CHEST WALL RECONSTRUCTION Right 05/09/2021   Procedure: CHEST WALL MASS RESECTION;  Surgeon: [name], MD;  Location: [facility];  Service: Thoracic;  Laterality: Right;  posterior   CYST EXCISION  x2   LEFT HEART CATH AND CORONARY ANGIOGRAPHY N/A 05/31/2020   Procedure: LEFT HEART CATH AND CORONARY ANGIOGRAPHY;  Surgeon: Nelva Bush, MD;  Location: [facility];  Service: Cardiovascular;  Laterality: N/A;   LIPOMA EXCISION     PARATHYROIDECTOMY     TONSILLECTOMY      Current Meds  Medication Sig   Albuterol Sulfate (PROAIR RESPICLICK) 604 (90 Base) MCG/ACT AEPB Inhale 2 puffs into the lungs every 6 (six) hours as needed (sob/wheezing).   atorvastatin (LIPITOR) 80 MG tablet Take 1 tablet (80 mg total) by mouth daily.   furosemide (LASIX) 20 MG tablet TAKE  1 TABLET ONCE DAILY AND THE PM PILL TO BE TAKEN AS NEEDED FOR SHORTNESS OF BREATH OR SWELLING   levothyroxine (SYNTHROID) 75 MCG tablet TAKE 1 TABLET BY MOUTH DAILY BEFORE BREAKFAST.   losartan (COZAAR) 25 MG tablet Take 0.5 tablets (12.5 mg total) by mouth  daily.   metoprolol succinate (TOPROL-XL) 25 MG 24 hr tablet Take 0.5 tablets (12.5 mg total) by mouth daily.   nitroGLYCERIN (NITROSTAT) 0.4 MG SL tablet Place 1 tablet (0.4 mg total) under the tongue every 5 (five) minutes as needed for chest pain.   pantoprazole (PROTONIX) 40 MG tablet Take 1 tablet (40 mg total) by mouth daily.   PREMARIN vaginal cream PLACE 0.5 GRAMS TWICE WEEKLY INTRAVAGINALLY.   ticagrelor (BRILINTA) 60 MG TABS tablet TAKE 1 TABLET (60 MG TOTAL) BY MOUTH 2 (TWO) TIMES DAILY.   umeclidinium-vilanterol (ANORO ELLIPTA) 62.5-25 MCG/INH AEPB Inhale 1 puff into the lungs daily.    Allergies: Humira [adalimumab] and Tape  Social History   Tobacco Use   Smoking status: Former    Packs/day: 1.00    Years: 40.00    Pack years: 40.00    Types: Cigarettes    Quit date: 09/18/2015    Years since quitting: 6.1   Smokeless tobacco: Never  Vaping Use   Vaping Use: Never used  Substance Use Topics   Alcohol use: Yes    Comment: on occasion   Drug use: Never    Family History  Problem Relation Age of Onset   Heart attack Mother 33   COPD Mother    Stroke Mother    Diverticulitis Mother    Cancer Mother        breast   Hypertension Mother    Breast cancer Mother 76   Hypertension Father    Stroke Maternal Grandfather    Alzheimer's disease Paternal Grandmother    Heart attack Paternal Grandfather    Breast cancer Cousin        mat cousin x 2   Hypertension Brother     Review of Systems: A 12-system review of systems was performed and was negative except as noted in the HPI.  --------------------------------------------------------------------------------------------------  Physical Exam: BP 124/74 (BP Location: Left Arm, Patient Position: Sitting, Cuff Size: Normal)    Pulse 78    Ht 5\' 6"  (1.676 m)    Wt 142 lb (64.4 kg)    LMP 11/16/2007 (Approximate)    SpO2 98%    BMI 22.92 kg/m   General:  NAD. Neck: No JVD or HJR. Lungs: Clear to auscultation  bilaterally without wheezes or crackles. Heart: Regular rate and rhythm without murmurs, rubs, or gallops. Abdomen: Soft, nontender, nondistended. Extremities: No lower extremity edema.  EKG: Normal sinus rhythm with rightward axis, low voltage, and poor R wave progression in V1 and V2.  No significant change from prior tracing on 05/07/2021.  Lab Results  Component Value Date   WBC 5.9 09/12/2021   HGB 12.5 09/12/2021   HCT 37.1 09/12/2021   MCV 94 09/12/2021   PLT 396 09/12/2021    Lab Results  Component Value Date   NA 141 09/12/2021   K 4.4 09/12/2021   CL 101 09/12/2021   CO2 26 09/12/2021   BUN 22 09/12/2021   CREATININE 1.00 09/12/2021   GLUCOSE 83 09/12/2021   ALT 21 09/12/2021    Lab Results  Component Value Date   CHOL 146 09/12/2021   HDL 68 09/12/2021   LDLCALC 58 09/12/2021   TRIG 114  09/12/2021    --------------------------------------------------------------------------------------------------  ASSESSMENT AND PLAN: Coronary artery disease: No angina reported status post remote PCI to RCA.  Last catheterization in 2021 showed patent RCA stents with nonobstructive CAD involving the LAD and LCx.  Continue long-term antiplatelet therapy with ticagrelor as well as aggressive secondary prevention with atorvastatin and metoprolol.  Stress-induced cardiomyopathy and pericardial effusion: No signs or symptoms of recurrent heart failure.  Most recent echo in 09/2020 showed normal LVEF with stable small-moderate pericardial effusion we have agreed to switch furosemide from daily dosing to as needed.  Continue current doses of metoprolol and losartan.  PAD: No leg pain reported.  Prior ABIs suggested possible inflow disease based on waveforms though ratios were normal.  Given absence of symptoms, continue medical therapy.  Follow-up: Return to clinic in 1 year.  Nelva Bush, MD 11/07/2021 9:57 AM

## 2021-11-06 ENCOUNTER — Ambulatory Visit: Payer: BLUE CROSS/BLUE SHIELD

## 2021-11-06 DIAGNOSIS — M546 Pain in thoracic spine: Secondary | ICD-10-CM | POA: Diagnosis not present

## 2021-11-06 DIAGNOSIS — R293 Abnormal posture: Secondary | ICD-10-CM

## 2021-11-06 DIAGNOSIS — M542 Cervicalgia: Secondary | ICD-10-CM

## 2021-11-06 NOTE — Therapy (Signed)
[facility] [address], [place], [address] Phone: [phone]   Fax:  [phone]  Physical Therapy Treatment  Patient Details  Name: April Raymond MRN: [id number] Date of Birth: [birth year] Referring Provider (PT): Melodie Bouillon MD   Encounter Date: 11/06/2021   PT End of Session - 11/06/21 1331     Visit Number 6    Number of Visits 17    Date for PT Re-Evaluation 12/10/21    PT Start Time 1331    PT Stop Time 1416    PT Time Calculation (min) 45 min    Activity Tolerance Patient tolerated treatment well    Behavior During Therapy [facility] for tasks assessed/performed             Past Medical History:  Diagnosis Date   Alpha-1-antitrypsin deficiency carrier 02/20/2021   MZ phenotype   Anginal pain ([place])    CAD (coronary artery disease)    a. 2017 s/p prior MI & PCI RCA ([place]); b. 05/2020 Abnl ETT: 1.5-79mm horizontal ST dep in inflat leads @ peak stress w/ freq PVCs/couplets/bigeminy. HTN response; b. 05/2020 NSTEMI/Cath: LM nl, LAD 75m, LCX 20, RCA 35ost, patent prev placed prox-dist stent. EF 25-35% w/ apical ballooning.   COPD (chronic obstructive pulmonary disease) (HCC)    Hashimoto's disease    Hidradenitis suppurativa    Hydradenitis    Hyperparathyroidism ([place])    Hypertension    Myocardial infarction ([place]) 2017   Pericardial effusion    a. 05/2020 Echo: sm to mod circumferential peric eff w/o tamponade; b. 07/2020 Mod peric effusion; c. 09/2020 Echo: EF 60-65%, no rwma, small tomod effusion w/o compromise.   Takotsubo cardiomyopathy    a. 05/2020 Echo: EF 35-40%, glob HK, though basal wall motion best preserved consistent w/ stress induced CM. Gr1 DD. Nl RV size/fxn. Sm to mod circumferential pericardial effusion w/o tamponade.    Past Surgical History:  Procedure Laterality Date   BREAST BIOPSY Right 05/04/2021   stereo x clip BENIGN BREAST TISSUE WITH MILD STROMAL FIBROSIS AND  HEMORRHAGE   BREAST CYST ASPIRATION Right 05/01/2021   BREAST EXCISIONAL BIOPSY Left    age 15's   CARDIAC CATHETERIZATION     CHEST WALL RECONSTRUCTION Right 05/09/2021   Procedure: CHEST WALL MASS RESECTION;  Surgeon: [name], MD;  Location: [facility];  Service: Thoracic;  Laterality: Right;  posterior   CYST EXCISION  x2   LEFT HEART CATH AND CORONARY ANGIOGRAPHY N/A 05/31/2020   Procedure: LEFT HEART CATH AND CORONARY ANGIOGRAPHY;  Surgeon: Nelva Bush, MD;  Location: [facility];  Service: Cardiovascular;  Laterality: N/A;   PARATHYROIDECTOMY     TONSILLECTOMY      There were no vitals filed for this visit.   Subjective Assessment - 11/06/21 1332     Subjective Today neck and mid back not too bad. 1-2/10 currently B medial shoulder blades. Mid back around the incision area bothered her this weekend. Diminished. Did her HEP and used a heating pad. Did 4 hours of paperwork Friday which bothered her neck and thoracic spine. Did some cleaning Saturday.    Pertinent History Pt is a 61 y.o. female referred to OP PT due to thoracic back pain s/p Lipoma removal on 05/09/21 on R side since after surgery. PMH inlcudes: CAD, COPD, Hashimoto's disease, HTN, takotsubo cardiomyopathy.  Pt's pain is described as aching and burning with what she describes as back spasms since heoperation.  Worst pain reported as a 5/10 NPS lately, but has been up to a 9/10 around 2 months ago. Currently pain at 4-5/10 NPS. Pain located between B shoulder blades, upper traps, and cervical paraspinals. Does report B referring pain to lateral aspects of upper arms at deltoids, denies N/T througohut UE's. Throughout 24 hours, neck pain wakes pt up peridocially. Back becomes worse as day goes on with activities such as yard work such as Freight [employer], any global activity such as walking on treadmill. Pain is improved with heat, pain meds but has stopped taking pain meds. Pt denies B/B changes, significant weight  loss, unrelenting night pain. Pt's goal is to improve pain and get back to exercising daily.    Limitations Sitting;House hold activities;Lifting;Standing;Walking    How long can you sit comfortably? 45 minutes    How long can you stand comfortably? 45 minutes    How long can you walk comfortably? 30 minutes    Patient Stated Goals Improve pain    Currently in Pain? Yes    Pain Score 2     Pain Onset More than a month ago                                        PT Education - 11/06/21 1649     Education Details ther-ex, HEP    Person(s) Educated Patient    Methods Explanation;Demonstration;Tactile cues;Verbal cues;Handout    Comprehension Returned demonstration;Verbalized understanding           There.ex:       first rib stretch with strap  R 5x15 seconds   L 5x15 seconds   Standing B scapular retraction green band 15x2  Doorway pectoralis stretch 30 seconds for 3 sets  Lower trap raise at wall             R 10x3             L 10x3  Standing shoulder extension with scapular retraction green band   L 10x2  Seated manually resisted upper  trunk rotation isometrics   L 10x5 seconds for 2 sets to promote more neutral trunk posture  Standing R trunk side bend 10x10 seconds   Decreased thoracic pain  Seated chin tucks 10x5 seconds       Improved exercise technique, movement at target joints, use of target muscles after min to mod verbal, visual, tactile cues.        Manual Therapy              seated STM to R and L upper trap muscle to decrease tension                  Response to treatment Pt tolerated session well without aggravation of symptoms.         Clinical impression Decreased B medial scapular discomfort with treatment to decrease B scalene muscle tension. Continued working on improving lower trap muscle activation to decrease upper trap muscle tension as well as thoracic extension to decrease stress to lower cervical  spine. Pt tolerated session well without aggravation of symptoms. Pt will benefit from continued skilled physical therapy services to decrease pain, improve strength and function.      PT Short Term Goals - 10/16/21 0815       PT SHORT TERM GOAL #1   Title Pt will be independent with HEP to improve pain and  cervical AROM for ADL completion    Baseline 10/15/21: initiated    Time 4    Period Weeks    Status New    Target Date 11/12/21               PT Long Term Goals - 10/16/21 0816       PT LONG TERM GOAL #1   Title Pt will improve cervical AROM in extension and rotation by 10 deg to demonstrate clinically significant improvement in mobility.    Baseline 10/15/21: extension 32 deg, R/L rotation 60 deg/60 deg    Time 8    Period Weeks    Status New    Target Date 12/10/21      PT LONG TERM GOAL #2   Title Pt will report < 2/10 pain via NPRS with exercise and household task completion to demonstrate clinically significant improvement in pain.    Baseline 10/15/21: 5/10 NPRS    Time 8    Period Weeks    Status New    Target Date 12/11/21      PT LONG TERM GOAL #3   Title Pt will improve FOTO to target score of 68 to demonstrate clinically significant improvement in functional mobility.    Baseline 10/15/21: 53    Time 8    Period Weeks    Status New    Target Date 12/10/21                   Plan - 11/06/21 1329     Clinical Impression Statement Decreased B medial scapular discomfort with treatment to decrease B scalene muscle tension. Continued working on improving lower trap muscle activation to decrease upper trap muscle tension as well as thoracic extension to decrease stress to lower cervical spine. Pt tolerated session well without aggravation of symptoms. Pt will benefit from continued skilled physical therapy services to decrease pain, improve strength and function.    Personal Factors and Comorbidities Age;Comorbidity 3+;Fitness;Time since onset of  injury/illness/exacerbation    Comorbidities PMH inlcudes: CAD, COPD, Hashimoto's disease, HTN, takotsubo cardiomyopathy.    Examination-Activity Limitations Sleep;Sit;Bend;Carry;Stand;Lift;Locomotion Level    Examination-Participation Restrictions Community Activity;Yard Work    Stability/Clinical Decision Making Stable/Uncomplicated    Rehab Potential Good    PT Frequency 2x / week    PT Duration 8 weeks    PT Treatment/Interventions ADLs/Self Care Home Management;Cryotherapy;Electrical Stimulation;Moist Heat;Traction;Functional mobility training;Therapeutic activities;Therapeutic exercise;Neuromuscular re-education;Patient/family education;Manual techniques;Dry needling;Passive range of motion;Spinal Manipulations;Joint Manipulations    PT Next Visit Plan Cervical/thoracic mobility, manual techniques for trigger point reduction and improved joint mobility    PT Home Exercise Plan Access Code [facility]    Consulted and Agree with Plan of Care Patient             Patient will benefit from skilled therapeutic intervention in order to improve the following deficits and impairments:  Hypomobility, Pain, Increased muscle spasms, Decreased range of motion  Visit Diagnosis: Pain in thoracic spine  Abnormal posture  Cervicalgia     Problem List Patient Active Problem List   Diagnosis Date Noted   Chronic upper back pain (1ry area of Pain) (Right) 10/22/2021   Chronic thoracic back pain (Bilateral) (R>L) 10/22/2021   Chronic neck pain (2ry area of Pain) (Bilateral) (R>L) 10/22/2021   Chronic shoulder pain (3ry area of Pain) (Bilateral) 10/22/2021   Painful cervical range of motion 10/22/2021   Impaired range of motion of cervical spine 10/22/2021   Spasm of thoracic back muscle 10/22/2021  CKD (chronic kidney disease) stage 3, GFR 30-59 ml/min (HCC) 09/08/2021   H/O lipoma 05/09/2021   Alpha-1-antitrypsin deficiency carrier 02/20/2021   Moderate COPD (chronic obstructive  pulmonary disease) ([place]) 01/12/2021   PAD (peripheral artery disease) ([place]) 09/22/2020   Nonischemic cardiomyopathy ([place]) 05/31/2020   Former smoker 09/30/2019   History of MI (myocardial infarction) 09/30/2019   History of parathyroidectomy ([place]) 09/30/2019   Coronary artery disease of native artery of native heart with stable angina pectoris ([place]) 09/30/2019   Essential hypertension 09/30/2019   Hyperlipidemia LDL goal <70 09/30/2019   Hashimoto's disease 09/30/2019   Vaginal atrophy 09/30/2019   Hidradenitis suppurativa 09/30/2019   Joneen Boers PT, DPT  11/06/2021, 4:55 PM  [facility] [address], [place], [address] Phone: [phone]   Fax:  [phone]  Name: DONESHA WALLANDER MRN: [id number] Date of Birth: Apr 16, 1961

## 2021-11-06 NOTE — Patient Instructions (Signed)
Access Code: [email or web address] URL: [email or web address] Date: 11/06/2021 Prepared by: Joneen Boers  Exercises Supine Chin Tuck - 1 x daily - 7 x weekly - 2 sets - 20 reps Seated Cervical Sidebending Stretch - 1 x daily - 7 x weekly - 2 sets - 3 reps - 30 hold Seated Scapular Retraction - 1 x daily - 7 x weekly - 2 sets - 20 reps Standing Lumbar Spine Flexion Stretch Counter - 1 x daily - 7 x weekly - 2 sets - 3 reps - 30 hold Seated Flexion Stretch - 1 x daily - 7 x weekly - 1 sets - 10 reps - 10 seconds hold Seated Sidebending - 3 x daily - 7 x weekly - 1 sets - 10 reps - 5 seconds hold First Rib Mobilization with Strap - 3 x daily - 7 x weekly - 1 sets - 5 reps - 15 seconds hold

## 2021-11-07 ENCOUNTER — Other Ambulatory Visit: Payer: Self-pay

## 2021-11-07 ENCOUNTER — Encounter: Payer: Self-pay | Admitting: Internal Medicine

## 2021-11-07 ENCOUNTER — Ambulatory Visit: Payer: No Typology Code available for payment source | Admitting: Internal Medicine

## 2021-11-07 VITALS — BP 124/74 | HR 78 | Ht 66.0 in | Wt 142.0 lb

## 2021-11-07 DIAGNOSIS — I5181 Takotsubo syndrome: Secondary | ICD-10-CM

## 2021-11-07 DIAGNOSIS — E785 Hyperlipidemia, unspecified: Secondary | ICD-10-CM | POA: Diagnosis not present

## 2021-11-07 DIAGNOSIS — I10 Essential (primary) hypertension: Secondary | ICD-10-CM

## 2021-11-07 DIAGNOSIS — I739 Peripheral vascular disease, unspecified: Secondary | ICD-10-CM

## 2021-11-07 DIAGNOSIS — I3139 Other pericardial effusion (noninflammatory): Secondary | ICD-10-CM

## 2021-11-07 DIAGNOSIS — I2510 Atherosclerotic heart disease of native coronary artery without angina pectoris: Secondary | ICD-10-CM | POA: Diagnosis not present

## 2021-11-07 MED ORDER — FUROSEMIDE 20 MG PO TABS: 20.0000 mg | ORAL_TABLET | Freq: Every day | ORAL | Status: DC | PRN

## 2021-11-07 NOTE — Progress Notes (Signed)
Contacted via [facility] you are well -- looks like this mammogram is stable and they would like to repeat in August 2022 to ensure ongoing stability.

## 2021-11-07 NOTE — Patient Instructions (Signed)
Medication Instructions:   Your physician has recommended you make the following change in your medication:   CHANGE Furosemide (Lasix) 20 mg daily AS NEEDED for weight gain and/or edema (swelling).   *If you need a refill on your cardiac medications before your next appointment, please call your pharmacy*   Lab Work:  None ordered  Testing/Procedures:  None ordered   Follow-Up: At [facility], you and your health needs are our priority.  As part of our continuing mission to provide you with exceptional heart care, we have created designated Provider Care Teams.  These Care Teams include your primary Cardiologist (physician) and Advanced Practice Providers (APPs -  Physician Assistants and Nurse Practitioners) who all work together to provide you with the care you need, when you need it.  We recommend signing up for the patient portal called "MyChart".  Sign up information is provided on this After Visit Summary.  MyChart is used to connect with patients for Virtual Visits (Telemedicine).  Patients are able to view lab/test results, encounter notes, upcoming appointments, etc.  Non-urgent messages can be sent to your provider as well.   To learn more about what you can do with MyChart, go to [email or web address].    Your next appointment:   1 year(s)  The format for your next appointment:   In Person  Provider:   You may see Nelva Bush, MD or one of the following Advanced Practice Providers on your designated Care Team:   Murray Hodgkins, NP Christell Faith, PA-C Cadence Kathlen Mody, [place]

## 2021-11-08 ENCOUNTER — Ambulatory Visit: Payer: BLUE CROSS/BLUE SHIELD

## 2021-11-12 ENCOUNTER — Ambulatory Visit: Payer: BLUE CROSS/BLUE SHIELD

## 2021-11-12 ENCOUNTER — Other Ambulatory Visit: Payer: Self-pay

## 2021-11-12 DIAGNOSIS — M546 Pain in thoracic spine: Secondary | ICD-10-CM

## 2021-11-12 DIAGNOSIS — M542 Cervicalgia: Secondary | ICD-10-CM

## 2021-11-12 DIAGNOSIS — R293 Abnormal posture: Secondary | ICD-10-CM

## 2021-11-12 NOTE — Therapy (Signed)
[facility] [address], [place], [address] Phone: [phone]   Fax:  [phone]  Physical Therapy Treatment  Patient Details  Name: April Raymond MRN: [id number] Date of Birth: 1961-08-28 Referring Provider (PT): Melodie Bouillon MD   Encounter Date: 11/12/2021   PT End of Session - 11/12/21 1506     Visit Number 7    Number of Visits 17    Date for PT Re-Evaluation 12/10/21    PT Start Time 5726    PT Stop Time 1546    PT Time Calculation (min) 40 min    Activity Tolerance Patient tolerated treatment well    Behavior During Therapy [facility] for tasks assessed/performed             Past Medical History:  Diagnosis Date   Alpha-1-antitrypsin deficiency carrier 02/20/2021   MZ phenotype   Anginal pain ([name])    CAD (coronary artery disease)    a. 2017 s/p prior MI & PCI RCA ([place]); b. 05/2020 Abnl ETT: 1.5-37mm horizontal ST dep in inflat leads @ peak stress w/ freq PVCs/couplets/bigeminy. HTN response; b. 05/2020 NSTEMI/Cath: LM nl, LAD 71m, LCX 20, RCA 35ost, patent prev placed prox-dist stent. EF 25-35% w/ apical ballooning.   COPD (chronic obstructive pulmonary disease) (HCC)    Hashimoto's disease    Hidradenitis suppurativa    Hydradenitis    Hyperparathyroidism ([place])    Hypertension    Myocardial infarction ([place]) 2017   Pericardial effusion    a. 05/2020 Echo: sm to mod circumferential peric eff w/o tamponade; b. 07/2020 Mod peric effusion; c. 09/2020 Echo: EF 60-65%, no rwma, small tomod effusion w/o compromise.   Takotsubo cardiomyopathy    a. 05/2020 Echo: EF 35-40%, glob HK, though basal wall motion best preserved consistent w/ stress induced CM. Gr1 DD. Nl RV size/fxn. Sm to mod circumferential pericardial effusion w/o tamponade.    Past Surgical History:  Procedure Laterality Date   BREAST BIOPSY Right 05/04/2021   stereo x clip BENIGN BREAST TISSUE WITH MILD STROMAL FIBROSIS AND  HEMORRHAGE   BREAST CYST ASPIRATION Right 05/01/2021   BREAST EXCISIONAL BIOPSY Left    age 21's   CARDIAC CATHETERIZATION     CHEST WALL RECONSTRUCTION Right 05/09/2021   Procedure: CHEST WALL MASS RESECTION;  Surgeon: [name], MD;  Location: [facility];  Service: Thoracic;  Laterality: Right;  posterior   CYST EXCISION  x2   LEFT HEART CATH AND CORONARY ANGIOGRAPHY N/A 05/31/2020   Procedure: LEFT HEART CATH AND CORONARY ANGIOGRAPHY;  Surgeon: Nelva Bush, MD;  Location: [place];  Service: Cardiovascular;  Laterality: N/A;   LIPOMA EXCISION     PARATHYROIDECTOMY     TONSILLECTOMY      There were no vitals filed for this visit.   Subjective Assessment - 11/12/21 1507     Subjective Neck and mid back are not bad, between 1-2/10 currently. The surgical incision area is ok, occasionally gets shots in that area.    Pertinent History Pt is a 61 y.o. female referred to OP PT due to thoracic back pain s/p Lipoma removal on 05/09/21 on R side since after surgery. PMH inlcudes: CAD, COPD, Hashimoto's disease, HTN, takotsubo cardiomyopathy.  Pt's pain is described as aching and burning with what she describes as back spasms since heoperation. Worst pain reported as a 5/10 NPS lately, but has been up to a 9/10 around 2 months ago. Currently pain at  4-5/10 NPS. Pain located between B shoulder blades, upper traps, and cervical paraspinals. Does report B referring pain to lateral aspects of upper arms at deltoids, denies N/T througohut UE's. Throughout 24 hours, neck pain wakes pt up peridocially. Back becomes worse as day goes on with activities such as yard work such as Freight forwarder, any global activity such as walking on treadmill. Pain is improved with heat, pain meds but has stopped taking pain meds. Pt denies B/B changes, significant weight loss, unrelenting night pain. Pt's goal is to improve pain and get back to exercising daily.    Limitations Sitting;House hold  activities;Lifting;Standing;Walking    How long can you sit comfortably? 45 minutes    How long can you stand comfortably? 45 minutes    How long can you walk comfortably? 30 minutes    Patient Stated Goals Improve pain    Currently in Pain? Yes    Pain Score 2     Pain Onset More than a month ago                                        PT Education - 11/12/21 1526     Education Details ther-ex    Person(s) Educated Patient    Methods Explanation;Demonstration;Tactile cues;Verbal cues    Comprehension Returned demonstration;Verbalized understanding             There-ex:      Seated manually resisted scapular retraction targeting the lower trap muscles  R 10x3 with 5 second holds  L 10x3 with 5 second holds  Seated manually resisted upper  trunk rotation isometrics              L 10x5 seconds for 2 sets to promote more neutral trunk posture  Decreased neck pain   Seated chin tucks 10x5 seconds for 2 sets   Doorway pectoralis stretch 30 seconds for 3 sets  Lower trap raise at wall             R 10x3             L 10x3   Standing B scapular retraction green band 15x   Improved exercise technique, movement at target joints, use of target muscles after min to mod verbal, visual, tactile cues.        Manual Therapy             seated STM to R and L upper trap muscle to decrease tension               Seated STM R cervical paraspinal muscles to decrease tension.     Decreased neck pain afterwards.      Response to treatment Pt tolerated session well without aggravation of symptoms.         Clinical impression Starting neck pain stabilizing to 1-2/10. Continued working on improving posture as well as lower trap muscle activation to decrease upper trap muscle tension. Decreased neck pain reported after session. Pt will benefit from continued skilled physical therapy services to decrease pain, improve strength and function.       PT Short Term Goals - 10/16/21 0815       PT SHORT TERM GOAL #1   Title Pt will be independent with HEP to improve pain and cervical AROM for ADL completion    Baseline 10/15/21: initiated    Time 4    Period Weeks  Status New    Target Date 11/12/21               PT Long Term Goals - 10/16/21 0816       PT LONG TERM GOAL #1   Title Pt will improve cervical AROM in extension and rotation by 10 deg to demonstrate clinically significant improvement in mobility.    Baseline 10/15/21: extension 32 deg, R/L rotation 60 deg/60 deg    Time 8    Period Weeks    Status New    Target Date 12/10/21      PT LONG TERM GOAL #2   Title Pt will report < 2/10 pain via NPRS with exercise and household task completion to demonstrate clinically significant improvement in pain.    Baseline 10/15/21: 5/10 NPRS    Time 8    Period Weeks    Status New    Target Date 12/11/21      PT LONG TERM GOAL #3   Title Pt will improve FOTO to target score of 68 to demonstrate clinically significant improvement in functional mobility.    Baseline 10/15/21: 53    Time 8    Period Weeks    Status New    Target Date 12/10/21                   Plan - 11/12/21 1654     Clinical Impression Statement Starting neck pain stabilizing to 1-2/10. Continued working on improving posture as well as lower trap muscle activation to decrease upper trap muscle tension. Decreased neck pain reported after session. Pt will benefit from continued skilled physical therapy services to decrease pain, improve strength and function.    Personal Factors and Comorbidities Age;Comorbidity 3+;Fitness;Time since onset of injury/illness/exacerbation    Comorbidities PMH inlcudes: CAD, COPD, Hashimoto's disease, HTN, takotsubo cardiomyopathy.    Examination-Activity Limitations Sleep;Sit;Bend;Carry;Stand;Lift;Locomotion Level    Examination-Participation Restrictions Community Activity;Yard Work    Stability/Clinical Decision  Making Stable/Uncomplicated    Rehab Potential Good    PT Frequency 2x / week    PT Duration 8 weeks    PT Treatment/Interventions ADLs/Self Care Home Management;Cryotherapy;Electrical Stimulation;Moist Heat;Traction;Functional mobility training;Therapeutic activities;Therapeutic exercise;Neuromuscular re-education;Patient/family education;Manual techniques;Dry needling;Passive range of motion;Spinal Manipulations;Joint Manipulations    PT Next Visit Plan Cervical/thoracic mobility, manual techniques for trigger point reduction and improved joint mobility    PT Home Exercise Plan Access Code [facility]    Consulted and Agree with Plan of Care Patient             Patient will benefit from skilled therapeutic intervention in order to improve the following deficits and impairments:  Hypomobility, Pain, Increased muscle spasms, Decreased range of motion  Visit Diagnosis: Pain in thoracic spine  Abnormal posture  Cervicalgia     Problem List Patient Active Problem List   Diagnosis Date Noted   Chronic upper back pain (1ry area of Pain) (Right) 10/22/2021   Chronic thoracic back pain (Bilateral) (R>L) 10/22/2021   Chronic neck pain (2ry area of Pain) (Bilateral) (R>L) 10/22/2021   Chronic shoulder pain (3ry area of Pain) (Bilateral) 10/22/2021   Painful cervical range of motion 10/22/2021   Impaired range of motion of cervical spine 10/22/2021   Spasm of thoracic back muscle 10/22/2021   CKD (chronic kidney disease) stage 3, GFR 30-59 ml/min (HCC) 09/08/2021   H/O lipoma 05/09/2021   Pericardial effusion 05/02/2021   Alpha-1-antitrypsin deficiency carrier 02/20/2021   Moderate COPD (chronic obstructive pulmonary disease) ([place]) 01/12/2021  PAD (peripheral artery disease) ([place]) 09/22/2020   Nonischemic cardiomyopathy ([address]) 05/31/2020   Former smoker 09/30/2019   History of MI (myocardial infarction) 09/30/2019   History of parathyroidectomy ([place]) 09/30/2019   Coronary artery  disease of native artery of native heart with stable angina pectoris ([place]) 09/30/2019   Essential hypertension 09/30/2019   Hyperlipidemia LDL goal <70 09/30/2019   Hashimoto's disease 09/30/2019   Vaginal atrophy 09/30/2019   Hidradenitis suppurativa 09/30/2019    Joneen Boers PT, DPT  11/12/2021, 4:59 PM   [facility] [address], [place], [address] Phone: [phone]   Fax:  [phone]  Name: YARENI CREPS MRN: [id number] Date of Birth: 1961-04-08

## 2021-11-15 ENCOUNTER — Ambulatory Visit: Payer: BLUE CROSS/BLUE SHIELD | Attending: Thoracic Surgery (Cardiothoracic Vascular Surgery)

## 2021-11-15 ENCOUNTER — Other Ambulatory Visit: Payer: Self-pay

## 2021-11-15 DIAGNOSIS — M542 Cervicalgia: Secondary | ICD-10-CM | POA: Diagnosis present

## 2021-11-15 DIAGNOSIS — R293 Abnormal posture: Secondary | ICD-10-CM | POA: Insufficient documentation

## 2021-11-15 DIAGNOSIS — M546 Pain in thoracic spine: Secondary | ICD-10-CM | POA: Insufficient documentation

## 2021-11-15 NOTE — Therapy (Signed)
[facility] [address] [place], [place], [address] Phone: [phone]   Fax:  [phone]  Physical Therapy Treatment  Patient Details  Name: April Raymond MRN: [id number] Date of Birth: [birth year] Referring Provider (PT): Melodie Bouillon MD   Encounter Date: 11/15/2021   PT End of Session - 11/15/21 1334     Visit Number 8    Number of Visits 17    Date for PT Re-Evaluation 12/10/21    PT Start Time 4010    PT Stop Time 1416    PT Time Calculation (min) 42 min    Activity Tolerance Patient tolerated treatment well    Behavior During Therapy [facility] for tasks assessed/performed             Past Medical History:  Diagnosis Date   Alpha-1-antitrypsin deficiency carrier 02/20/2021   MZ phenotype   Anginal pain ([place])    CAD (coronary artery disease)    a. 2017 s/p prior MI & PCI RCA ([place]); b. 05/2020 Abnl ETT: 1.5-10mm horizontal ST dep in inflat leads @ peak stress w/ freq PVCs/couplets/bigeminy. HTN response; b. 05/2020 NSTEMI/Cath: LM nl, LAD 62m, LCX 20, RCA 35ost, patent prev placed prox-dist stent. EF 25-35% w/ apical ballooning.   COPD (chronic obstructive pulmonary disease) (HCC)    Hashimoto's disease    Hidradenitis suppurativa    Hydradenitis    Hyperparathyroidism ([place])    Hypertension    Myocardial infarction ([place]) 2017   Pericardial effusion    a. 05/2020 Echo: sm to mod circumferential peric eff w/o tamponade; b. 07/2020 Mod peric effusion; c. 09/2020 Echo: EF 60-65%, no rwma, small tomod effusion w/o compromise.   Takotsubo cardiomyopathy    a. 05/2020 Echo: EF 35-40%, glob HK, though basal wall motion best preserved consistent w/ stress induced CM. Gr1 DD. Nl RV size/fxn. Sm to mod circumferential pericardial effusion w/o tamponade.    Past Surgical History:  Procedure Laterality Date   BREAST BIOPSY Right 05/04/2021   stereo x clip BENIGN BREAST TISSUE WITH MILD STROMAL FIBROSIS AND  HEMORRHAGE   BREAST CYST ASPIRATION Right 05/01/2021   BREAST EXCISIONAL BIOPSY Left    age 61's   CARDIAC CATHETERIZATION     CHEST WALL RECONSTRUCTION Right 05/09/2021   Procedure: CHEST WALL MASS RESECTION;  Surgeon: April Matte, MD;  Location: [facility];  Service: Thoracic;  Laterality: Right;  posterior   CYST EXCISION  x2   LEFT HEART CATH AND CORONARY ANGIOGRAPHY N/A 05/31/2020   Procedure: LEFT HEART CATH AND CORONARY ANGIOGRAPHY;  Surgeon: April Bush, MD;  Location: [place] [facility];  Service: Cardiovascular;  Laterality: N/A;   LIPOMA EXCISION     PARATHYROIDECTOMY     TONSILLECTOMY      There were no vitals filed for this visit.   Subjective Assessment - 11/15/21 1335     Subjective Neck and midback have been a lot worse for the past 2 days since last session. 4/10 B upper trap area currently. Turning her head seems to be better.    Pertinent History Pt is a 61 y.o. female referred to OP PT due to thoracic back pain s/p Lipoma removal on 05/09/21 on R side since after surgery. PMH inlcudes: CAD, COPD, Hashimoto's disease, HTN, takotsubo cardiomyopathy.  Pt's pain is described as aching and burning with what she describes as back spasms since heoperation. Worst pain reported as a 5/10 NPS lately, but has been up to a 9/10  around 2 months ago. Currently pain at 4-5/10 NPS. Pain located between B shoulder blades, upper traps, and cervical paraspinals. Does report B referring pain to lateral aspects of upper arms at deltoids, denies N/T througohut UE's. Throughout 24 hours, neck pain wakes pt up peridocially. Back becomes worse as day goes on with activities such as yard work such as Freight forwarder, any global activity such as walking on treadmill. Pain is improved with heat, pain meds but has stopped taking pain meds. Pt denies B/B changes, significant weight loss, unrelenting night pain. Pt's goal is to improve pain and get back to exercising daily.    Limitations Sitting;House  hold activities;Lifting;Standing;Walking    How long can you sit comfortably? 45 minutes    How long can you stand comfortably? 45 minutes    How long can you walk comfortably? 30 minutes    Patient Stated Goals Improve pain    Currently in Pain? Yes    Pain Score 4     Pain Onset More than a month ago                                         There-ex:   Seated R first rib stretch with PT manual stabilization R first rib 20 seconds x 5   Seated B scapular retraction 10x5 seconds for 3 sets  Seated R upper trap stretch 20 seconds x 3  Cervical AROM   Flexion: WFL  Extension: Limited with R > L upper trap symptoms  Rotation   R WFL   L WFL with L upper trap symptoms.   Side bend    R limited with R upper trap symptoms.    L WFL  Chin tucks 2x5 seconds   Seated manually resisted scapular retraction isometrics targeting the lower trap muscles   L 10x5 seconds for 3 sets    Improved exercise technique, movement at target joints, use of target muscles after min to mod verbal, visual, tactile cues.        Manual Therapy             seated STM to R and L upper trap muscle to decrease tension               Seated STM R cervical paraspinal muscles to decrease tension.        Response to treatment Pt tolerated session well without aggravation of symptoms.         Clinical impression Lighter session today secondary to increased symptoms after last session. Improved comfort with cervical rotation however is also reported. Continued working on decreasing muscle tension to neck and B upper trap muscles to decrease stress to cervical spine. Decreased pain to 3/10 after treatment. Pt will benefit from continued skilled physical therapy services to decrease pain, improve strength and function.        PT Short Term Goals - 10/16/21 0815       PT SHORT TERM GOAL #1   Title Pt will be independent with HEP to improve pain and cervical AROM for ADL  completion    Baseline 10/15/21: initiated    Time 4    Period Weeks    Status New    Target Date 11/12/21               PT Long Term Goals - 10/16/21 0816       PT  LONG TERM GOAL #1   Title Pt will improve cervical AROM in extension and rotation by 10 deg to demonstrate clinically significant improvement in mobility.    Baseline 10/15/21: extension 32 deg, R/L rotation 60 deg/60 deg    Time 8    Period Weeks    Status New    Target Date 12/10/21      PT LONG TERM GOAL #2   Title Pt will report < 2/10 pain via NPRS with exercise and household task completion to demonstrate clinically significant improvement in pain.    Baseline 10/15/21: 5/10 NPRS    Time 8    Period Weeks    Status New    Target Date 12/11/21      PT LONG TERM GOAL #3   Title Pt will improve FOTO to target score of 68 to demonstrate clinically significant improvement in functional mobility.    Baseline 10/15/21: 53    Time 8    Period Weeks    Status New    Target Date 12/10/21                   Plan - 11/15/21 1528     Clinical Impression Statement Lighter session today secondary to increased symptoms after last session. Improved comfort with cervical rotation however is also reported. Continued working on decreasing muscle tension to neck and B upper trap muscles to decrease stress to cervical spine. Decreased pain to 3/10 after treatment. Pt will benefit from continued skilled physical therapy services to decrease pain, improve strength and function.    Personal Factors and Comorbidities Age;Comorbidity 3+;Fitness;Time since onset of injury/illness/exacerbation    Comorbidities PMH inlcudes: CAD, COPD, Hashimoto's disease, HTN, takotsubo cardiomyopathy.    Examination-Activity Limitations Sleep;Sit;Bend;Carry;Stand;Lift;Locomotion Level    Examination-Participation Restrictions Community Activity;Yard Work    Stability/Clinical Decision Making Stable/Uncomplicated    Rehab Potential Good     PT Frequency 2x / week    PT Duration 8 weeks    PT Treatment/Interventions ADLs/Self Care Home Management;Cryotherapy;Electrical Stimulation;Moist Heat;Traction;Functional mobility training;Therapeutic activities;Therapeutic exercise;Neuromuscular re-education;Patient/family education;Manual techniques;Dry needling;Passive range of motion;Spinal Manipulations;Joint Manipulations    PT Next Visit Plan Cervical/thoracic mobility, manual techniques for trigger point reduction and improved joint mobility    PT Home Exercise Plan Access Code [facility]    Consulted and Agree with Plan of Care Patient             Patient will benefit from skilled therapeutic intervention in order to improve the following deficits and impairments:  Hypomobility, Pain, Increased muscle spasms, Decreased range of motion  Visit Diagnosis: Pain in thoracic spine  Abnormal posture  Cervicalgia     Problem List Patient Active Problem List   Diagnosis Date Noted   Chronic upper back pain (1ry area of Pain) (Right) 10/22/2021   Chronic thoracic back pain (Bilateral) (R>L) 10/22/2021   Chronic neck pain (2ry area of Pain) (Bilateral) (R>L) 10/22/2021   Chronic shoulder pain (3ry area of Pain) (Bilateral) 10/22/2021   Painful cervical range of motion 10/22/2021   Impaired range of motion of cervical spine 10/22/2021   Spasm of thoracic back muscle 10/22/2021   CKD (chronic kidney disease) stage 3, GFR 30-59 ml/min (HCC) 09/08/2021   H/O lipoma 05/09/2021   Pericardial effusion 05/02/2021   Alpha-1-antitrypsin deficiency carrier 02/20/2021   Moderate COPD (chronic obstructive pulmonary disease) ([place]) 01/12/2021   PAD (peripheral artery disease) ([place]) 09/22/2020   Nonischemic cardiomyopathy ([place]) 05/31/2020   Former smoker 09/30/2019   History of MI (  myocardial infarction) 09/30/2019   History of parathyroidectomy ([place]) 09/30/2019   Coronary artery disease of native artery of native heart with stable  angina pectoris ([place]) 09/30/2019   Essential hypertension 09/30/2019   Hyperlipidemia LDL goal <70 09/30/2019   Hashimoto's disease 09/30/2019   Vaginal atrophy 09/30/2019   Hidradenitis suppurativa 09/30/2019   Joneen Boers PT, DPT  11/15/2021, 3:31 PM  [facility] [address] [place], [address] Phone: [phone]   Fax:  [phone]  Name: CORETTA LEISEY MRN: [id number] Date of Birth: [birth year]

## 2021-11-17 DIAGNOSIS — G894 Chronic pain syndrome: Secondary | ICD-10-CM | POA: Insufficient documentation

## 2021-11-17 NOTE — Progress Notes (Deleted)
No-show to the appointment ?

## 2021-11-19 ENCOUNTER — Ambulatory Visit: Payer: No Typology Code available for payment source | Admitting: Pain Medicine

## 2021-11-19 DIAGNOSIS — G588 Other specified mononeuropathies: Secondary | ICD-10-CM | POA: Insufficient documentation

## 2021-11-19 DIAGNOSIS — M542 Cervicalgia: Secondary | ICD-10-CM | POA: Insufficient documentation

## 2021-11-19 DIAGNOSIS — M4314 Spondylolisthesis, thoracic region: Secondary | ICD-10-CM | POA: Insufficient documentation

## 2021-11-19 DIAGNOSIS — M5382 Other specified dorsopathies, cervical region: Secondary | ICD-10-CM

## 2021-11-19 DIAGNOSIS — M5030 Other cervical disc degeneration, unspecified cervical region: Secondary | ICD-10-CM | POA: Insufficient documentation

## 2021-11-19 DIAGNOSIS — G8929 Other chronic pain: Secondary | ICD-10-CM

## 2021-11-19 DIAGNOSIS — G894 Chronic pain syndrome: Secondary | ICD-10-CM

## 2021-11-19 DIAGNOSIS — M62830 Muscle spasm of back: Secondary | ICD-10-CM

## 2021-11-19 DIAGNOSIS — M4312 Spondylolisthesis, cervical region: Secondary | ICD-10-CM | POA: Insufficient documentation

## 2021-11-22 ENCOUNTER — Ambulatory Visit: Payer: BLUE CROSS/BLUE SHIELD

## 2021-11-23 ENCOUNTER — Ambulatory Visit: Payer: No Typology Code available for payment source

## 2021-11-23 ENCOUNTER — Ambulatory Visit (INDEPENDENT_AMBULATORY_CARE_PROVIDER_SITE_OTHER): Payer: No Typology Code available for payment source

## 2021-11-23 ENCOUNTER — Ambulatory Visit
Admission: EM | Admit: 2021-11-23 | Discharge: 2021-11-23 | Disposition: A | Discharge status: Home / Self Care | Payer: BLUE CROSS/BLUE SHIELD | Attending: Emergency Medicine | Admitting: Emergency Medicine

## 2021-11-23 ENCOUNTER — Other Ambulatory Visit: Payer: Self-pay

## 2021-11-23 ENCOUNTER — Encounter: Payer: Self-pay | Admitting: Emergency Medicine

## 2021-11-23 DIAGNOSIS — R059 Cough, unspecified: Secondary | ICD-10-CM

## 2021-11-23 DIAGNOSIS — J441 Chronic obstructive pulmonary disease with (acute) exacerbation: Secondary | ICD-10-CM

## 2021-11-23 DIAGNOSIS — R051 Acute cough: Secondary | ICD-10-CM

## 2021-11-23 LAB — POCT RAPID STREP A (OFFICE): Rapid Strep A Screen: NEGATIVE

## 2021-11-23 IMAGING — DX DG CHEST 2V
2 series · 2 of 2 positions shown · non-contrast
Comparison: [DATE]

CLINICAL DATA: Cough

EXAM:
CHEST - 2 VIEW

[chest pa]
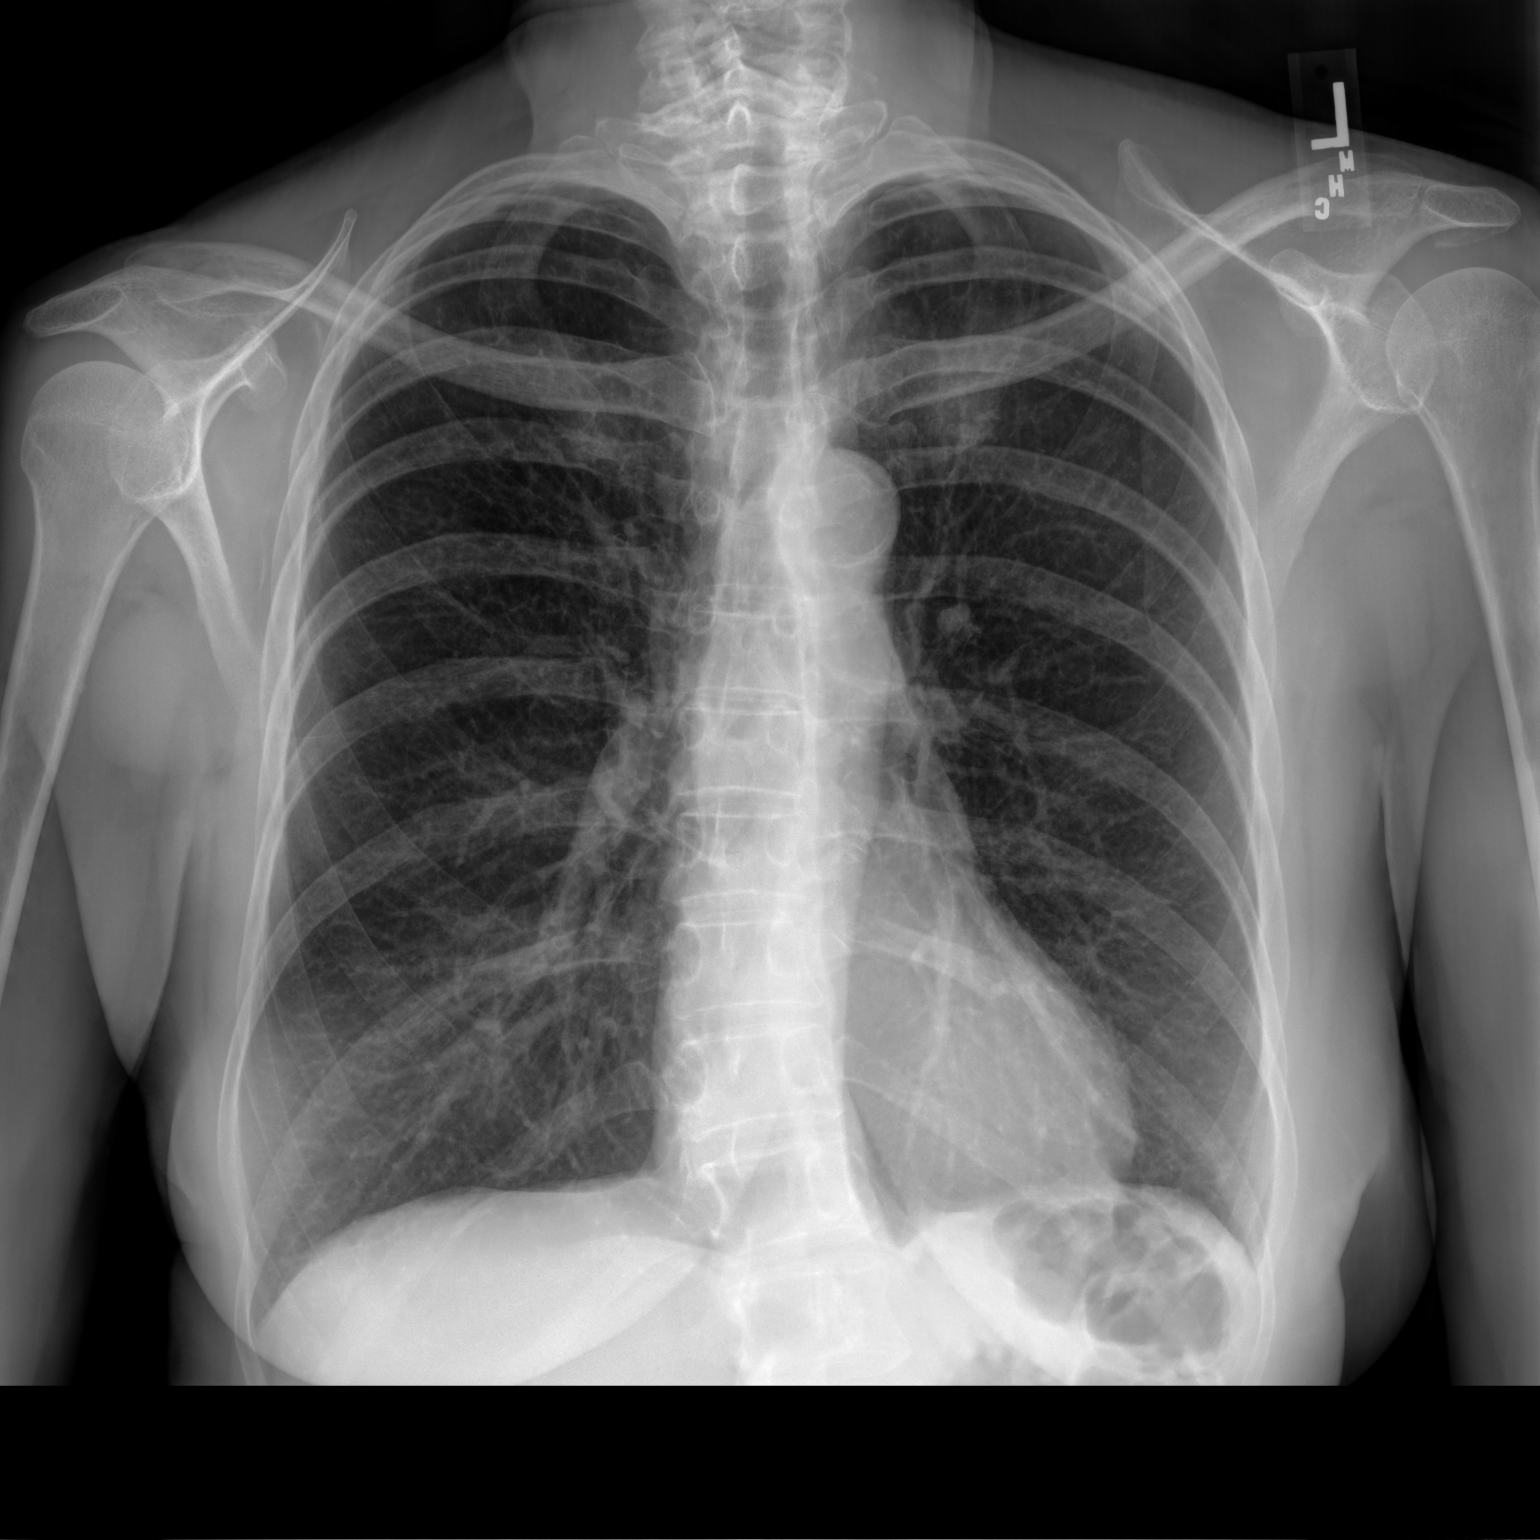

[chest lat]
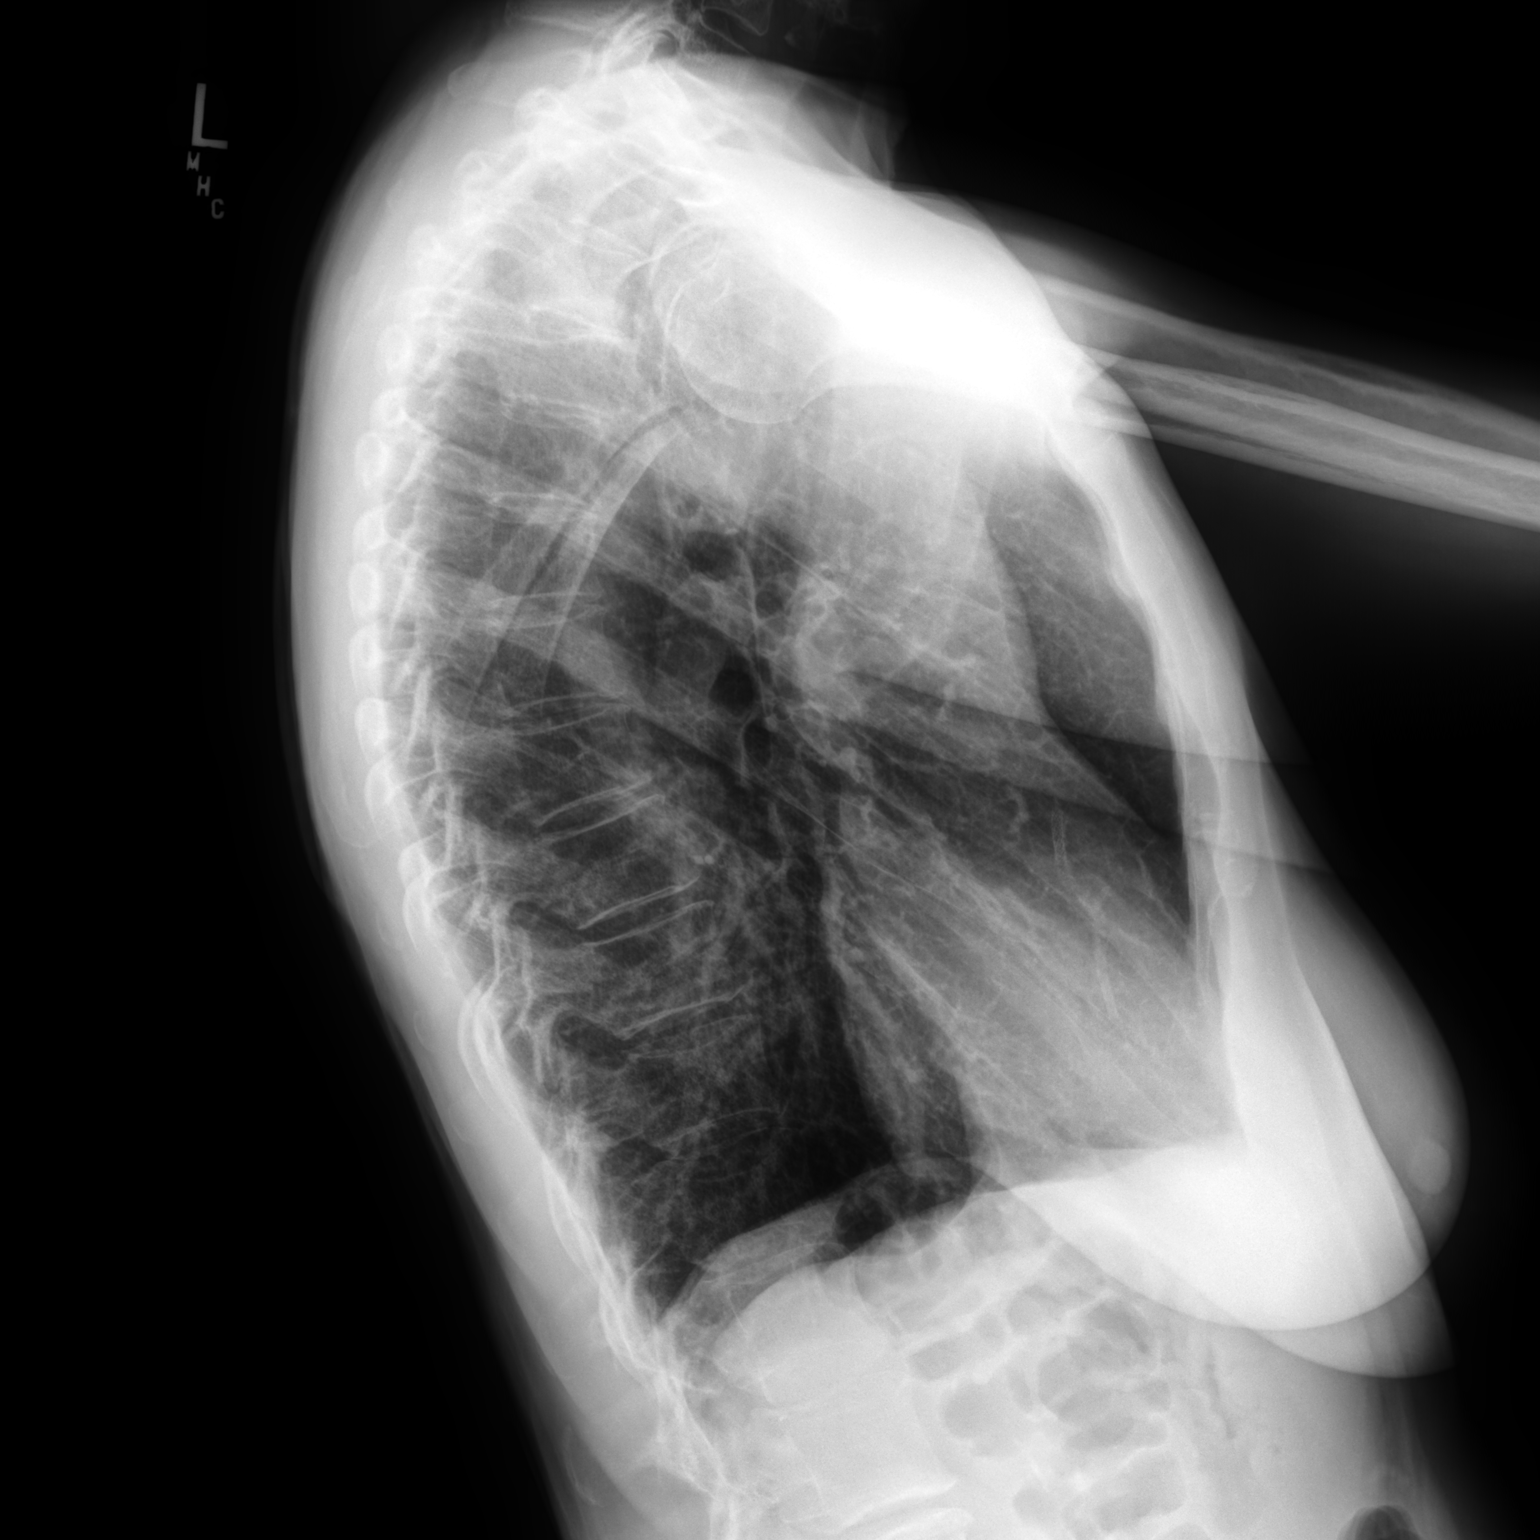

[2 of 2 positions shown; findings below may reference images not displayed]

FINDINGS: The heart size and mediastinal contours are within normal limits.
Atherosclerotic calcification of the aortic knob. Both lungs are
clear. The visualized skeletal structures are unremarkable.
IMPRESSION: No active cardiopulmonary disease.

## 2021-11-23 MED ORDER — PREDNISONE 10 MG PO TABS: 40.0000 mg | ORAL_TABLET | Freq: Every day | ORAL | 0 refills | Status: AC

## 2021-11-23 MED ORDER — AZITHROMYCIN 250 MG PO TABS: 250.0000 mg | ORAL_TABLET | Freq: Every day | ORAL | 0 refills | Status: DC

## 2021-11-23 NOTE — ED Triage Notes (Signed)
Pt presents with ST, cough, chest congestin and runny nose x 5 days ?

## 2021-11-23 NOTE — ED Provider Notes (Signed)
Roderic Palau    CSN: [id number] Arrival date & time: 11/23/21  1334      History   Chief Complaint Chief Complaint  Patient presents with   Nasal Congestion   Cough   Sore Throat    HPI April Raymond is a 61 y.o. female.  Accompanied by her husband, patient presents with 4 day history of congestion, runny nose, sore throat, and cough.  She also reports mild shortness of breath.  No fever, rash, wheezing, vomiting, diarrhea, or other symptoms.  Treatment at home with OTC cold medication.  Her medical history includes COPD, hypertension, MI, pericardial effusion, CKD, chronic pain, DDD, chronic back pain, thyroid disease.   The history is provided by the patient and medical records.   Past Medical History:  Diagnosis Date   Alpha-1-antitrypsin deficiency carrier 02/20/2021   MZ phenotype   Anginal pain ([place])    CAD (coronary artery disease)    a. 2017 s/p prior MI & PCI RCA ([place]); b. 05/2020 Abnl ETT: 1.5-63m horizontal ST dep in inflat leads @ peak stress w/ freq PVCs/couplets/bigeminy. HTN response; b. 05/2020 NSTEMI/Cath: LM nl, LAD 451mLCX 20, RCA 35ost, patent prev placed prox-dist stent. EF 25-35% w/ apical ballooning.   COPD (chronic obstructive pulmonary disease) (HCC)    Hashimoto's disease    Hidradenitis suppurativa    Hydradenitis    Hyperparathyroidism (HCL[place]   Hypertension    [facility]   Pericardial effusion    a. 05/2020 Echo: sm to mod circumferential peric eff w/o tamponade; b. 07/2020 Mod peric effusion; c. 09/2020 Echo: EF 60-65%, no rwma, small tomod effusion w/o compromise.   Takotsubo cardiomyopathy    a. 05/2020 Echo: EF 35-40%, glob HK, though basal wall motion best preserved consistent w/ stress induced CM. Gr1 DD. Nl RV size/fxn. Sm to mod circumferential pericardial effusion w/o tamponade.    Patient Active Problem List   Diagnosis Date Noted   Cervicalgia 11/19/2021   DDD (degenerative disc disease), cervical  11/19/2021   Grade 1 Anterolisthesis of cervical spine (C7/T1) 11/19/2021   Grade 1 Anterolisthesis of thoracic spine (T2/T3) 11/19/2021   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   Chronic pain syndrome 11/17/2021   Chronic upper back pain (1ry area of Pain) (Right) 10/22/2021   Chronic thoracic back pain (Bilateral) (R>L) 10/22/2021   Chronic neck pain (2ry area of Pain) (Bilateral) (R>L) 10/22/2021   Chronic shoulder pain (3ry area of Pain) (Bilateral) 10/22/2021   Painful cervical range of motion 10/22/2021   Impaired range of motion of cervical spine 10/22/2021   Spasm of thoracic back muscle 10/22/2021   CKD (chronic kidney disease) stage 3, GFR 30-59 ml/min (HCC) 09/08/2021   H/O lipoma 05/09/2021   Pericardial effusion 05/02/2021   Alpha-1-antitrypsin deficiency carrier 02/20/2021   Moderate COPD (chronic obstructive pulmonary disease) (HCLake[place]04/29/2022   PAD (peripheral artery disease) (HC[facility]01/03/2021   Nonischemic cardiomyopathy (HCHypol[facility]09/15/2021   Former smoker 09/30/2019   History of MI (myocardial infarction) 09/30/2019   History of parathyroidectomy (HCBe[facility]01/14/2021   Coronary artery disease of native artery of native heart with stable angina pectoris (HCSarat[place]01/14/2021   Essential hypertension 09/30/2019   Hyperlipidemia LDL goal <70 09/30/2019   Hashimoto's disease 09/30/2019   Vaginal atrophy 09/30/2019   Hidradenitis suppurativa 09/30/2019    Past Surgical History:  Procedure Laterality Date   BREAST BIOPSY Right 05/04/2021   stereo x clip BENIGN BREAST TISSUE WITH MILD STROMAL FIBROSIS AND HEMORRHAGE  BREAST CYST ASPIRATION Right 05/01/2021   BREAST EXCISIONAL BIOPSY Left    age 8's   CARDIAC CATHETERIZATION     CHEST WALL RECONSTRUCTION Right 05/09/2021   Procedure: CHEST WALL MASS RESECTION;  Surgeon: [name], MD;  Location: [facility];  Service: Thoracic;  Laterality: Right;  posterior   CYST EXCISION  x2   LEFT HEART CATH AND CORONARY ANGIOGRAPHY  N/A 05/31/2020   Procedure: LEFT HEART CATH AND CORONARY ANGIOGRAPHY;  Surgeon: Nelva Bush, MD;  Location: [facility];  Service: Cardiovascular;  Laterality: N/A;   LIPOMA EXCISION     PARATHYROIDECTOMY     TONSILLECTOMY      OB History   No obstetric history on file.      Home Medications    Prior to Admission medications   Medication Sig Start Date End Date Taking? Authorizing Provider  azithromycin (ZITHROMAX) 250 MG tablet Take 1 tablet (250 mg total) by mouth daily. Take first 2 tablets together, then 1 every day until finished. 11/23/21  Yes Sharion Balloon, NP  predniSONE (DELTASONE) 10 MG tablet Take 4 tablets (40 mg total) by mouth daily for 5 days. 11/23/21 11/28/21 Yes Sharion Balloon, NP  Albuterol Sulfate (PROAIR RESPICLICK) 093 (90 Base) MCG/ACT AEPB Inhale 2 puffs into the lungs every 6 (six) hours as needed (sob/wheezing). 12/04/20   Tyler Pita, MD  atorvastatin (LIPITOR) 80 MG tablet Take 1 tablet (80 mg total) by mouth daily. 09/12/21   Cannady, Henrine Screws T, NP  furosemide (LASIX) 20 MG tablet Take 1 tablet (20 mg total) by mouth daily as needed (wt gain / edema). 11/07/21   End, Harrell Gave, MD  levothyroxine (SYNTHROID) 75 MCG tablet TAKE 1 TABLET BY MOUTH DAILY BEFORE BREAKFAST. 05/14/21   Cannady, Henrine Screws T, NP  losartan (COZAAR) 25 MG tablet Take 0.5 tablets (12.5 mg total) by mouth daily. 09/12/21   Cannady, Henrine Screws T, NP  metoprolol succinate (TOPROL-XL) 25 MG 24 hr tablet Take 0.5 tablets (12.5 mg total) by mouth daily. 09/12/21   Cannady, Henrine Screws T, NP  nitroGLYCERIN (NITROSTAT) 0.4 MG SL tablet Place 1 tablet (0.4 mg total) under the tongue every 5 (five) minutes as needed for chest pain. 11/05/19 07/12/29  End, Harrell Gave, MD  pantoprazole (PROTONIX) 40 MG tablet Take 1 tablet (40 mg total) by mouth daily. 09/12/21   Cannady, Henrine Screws T, NP  PREMARIN vaginal cream PLACE 0.5 GRAMS TWICE WEEKLY INTRAVAGINALLY. 05/14/21   Cannady, Henrine Screws T, NP  ticagrelor  (BRILINTA) 60 MG TABS tablet TAKE 1 TABLET (60 MG TOTAL) BY MOUTH 2 (TWO) TIMES DAILY. 09/12/21   Cannady, Henrine Screws T, NP  umeclidinium-vilanterol (ANORO ELLIPTA) 62.5-25 MCG/INH AEPB Inhale 1 puff into the lungs daily. 02/19/21   Tyler Pita, MD    Family History Family History  Problem Relation Age of Onset   Heart attack Mother 40   COPD Mother    Stroke Mother    Diverticulitis Mother    Cancer Mother        breast   Hypertension Mother    Breast cancer Mother 39   Hypertension Father    Stroke Maternal Grandfather    Alzheimer's disease Paternal Grandmother    Heart attack Paternal Grandfather    Breast cancer Cousin        mat cousin x 2   Hypertension Brother     Social History Social History   Tobacco Use   Smoking status: Former    Packs/day: 1.00  Years: 40.00    Pack years: 40.00    Types: Cigarettes    Quit date: 09/18/2015    Years since quitting: 6.1   Smokeless tobacco: Never  Vaping Use   Vaping Use: Never used  Substance Use Topics   Alcohol use: Yes    Comment: on occasion   Drug use: Never     Allergies   Humira [adalimumab] and Tape   Review of Systems Review of Systems  Constitutional:  Negative for chills and fever.  HENT:  Positive for congestion, rhinorrhea and sore throat. Negative for ear pain.   Respiratory:  Positive for cough and shortness of breath.   Cardiovascular:  Negative for chest pain and palpitations.  Gastrointestinal:  Negative for diarrhea and vomiting.  Skin:  Negative for color change and rash.  All other systems reviewed and are negative.   Physical Exam Triage Vital Signs ED Triage Vitals  Enc Vitals Group     BP      Pulse      Resp      Temp      Temp src      SpO2      Weight      Height      Head Circumference      Peak Flow      Pain Score      Pain Loc      Pain Edu?      Excl. in [place]?    No data found.  Updated Vital Signs BP 125/73    Pulse (!) 114    Temp 98.1 F (36.7 C)    Resp  18    LMP 11/16/2007 (Approximate)    SpO2 96%   Visual Acuity Right Eye Distance:   Left Eye Distance:   Bilateral Distance:    Right Eye Near:   Left Eye Near:    Bilateral Near:     Physical Exam Vitals and nursing note reviewed.  Constitutional:      General: She is not in acute distress.    Appearance: She is well-developed.  HENT:     Right Ear: Tympanic membrane normal.     Left Ear: Tympanic membrane normal.     Nose: Rhinorrhea present.     Mouth/Throat:     Mouth: Mucous membranes are moist.     Pharynx: Posterior oropharyngeal erythema present.  Cardiovascular:     Rate and Rhythm: Normal rate and regular rhythm.     Heart sounds: Normal heart sounds.  Pulmonary:     Effort: Pulmonary effort is normal. No respiratory distress.     Breath sounds: Normal breath sounds. No wheezing.  Musculoskeletal:     Cervical back: Neck supple.  Skin:    General: Skin is warm and dry.  Neurological:     Mental Status: She is alert.  Psychiatric:        Mood and Affect: Mood normal.        Behavior: Behavior normal.     UC Treatments / Results  Labs (all labs ordered are listed, but only abnormal results are displayed) Labs Reviewed  COVID-19, FLU A+B AND RSV  POCT RAPID STREP A (OFFICE)    EKG   Radiology DG Chest 2 View  Result Date: 11/23/2021 CLINICAL DATA:  Cough EXAM: CHEST - 2 VIEW COMPARISON:  05/10/2021 FINDINGS: The heart size and mediastinal contours are within normal limits. Atherosclerotic calcification of the aortic knob. Both lungs are clear. The visualized skeletal structures  are unremarkable. IMPRESSION: No active cardiopulmonary disease. Electronically Signed   By: Davina Poke D.O.   On: 11/23/2021 14:18    Procedures Procedures (including critical care time)  Medications Ordered in UC Medications - No data to display  Initial Impression / Assessment and Plan / UC Course  I have reviewed the triage vital signs and the nursing  notes.  Pertinent labs & imaging results that were available during my care of the patient were reviewed by me and considered in my medical decision making (see chart for details).   Cough, COPD exacerbation.  CXR negative.  Rapid strep negative.  COVID, Flu, and RSV pending.  Instructed patient to self quarantine per CDC guidelines.  Treating with Zithromax, prednisone, and albuterol inhaler.  Discussed symptomatic treatment including Tylenol, rest, hydration.  Instructed patient to follow up with PCP if her symptoms are not improving.  Patient agrees to plan of care.    Final Clinical Impressions(s) / UC Diagnoses   Final diagnoses:  Acute cough  COPD exacerbation ([place])     Discharge Instructions      Take the Zithromax and prednisone as directed.  Use your albuterol inhaler as directed.  Follow up with your primary care provider if your symptoms are not improving.         ED Prescriptions     Medication Sig Dispense Auth. Provider   predniSONE (DELTASONE) 10 MG tablet Take 4 tablets (40 mg total) by mouth daily for 5 days. 20 tablet Sharion Balloon, NP   azithromycin (ZITHROMAX) 250 MG tablet Take 1 tablet (250 mg total) by mouth daily. Take first 2 tablets together, then 1 every day until finished. 6 tablet Sharion Balloon, NP      PDMP not reviewed this encounter.   Sharion Balloon, NP 11/23/21 1429

## 2021-11-23 NOTE — Discharge Instructions (Addendum)
Take the Zithromax and prednisone as directed.  Use your albuterol inhaler as directed.  Follow up with your primary care provider if your symptoms are not improving.   ? ? ?

## 2021-11-25 LAB — COVID-19, FLU A+B AND RSV
Influenza A, NAA: NOT DETECTED
Influenza B, NAA: NOT DETECTED
RSV, NAA: NOT DETECTED
SARS-CoV-2, NAA: NOT DETECTED

## 2021-11-29 ENCOUNTER — Ambulatory Visit: Payer: BLUE CROSS/BLUE SHIELD

## 2021-12-01 ENCOUNTER — Ambulatory Visit
Admission: EM | Admit: 2021-12-01 | Discharge: 2021-12-01 | Disposition: A | Discharge status: Home / Self Care | Payer: BLUE CROSS/BLUE SHIELD | Attending: Family Medicine | Admitting: Family Medicine

## 2021-12-01 ENCOUNTER — Other Ambulatory Visit: Payer: Self-pay

## 2021-12-01 ENCOUNTER — Encounter: Payer: Self-pay | Admitting: Emergency Medicine

## 2021-12-01 DIAGNOSIS — H65192 Other acute nonsuppurative otitis media, left ear: Secondary | ICD-10-CM | POA: Diagnosis not present

## 2021-12-01 MED ORDER — IPRATROPIUM BROMIDE 0.03 % NA SOLN: 2.0000 | Freq: Three times a day (TID) | NASAL | 0 refills | Status: DC | PRN

## 2021-12-01 NOTE — ED Provider Notes (Signed)
?[facility] ? ? ? ?CSN: [id number] ?Arrival date & time: 12/01/21  0932 ? ? ?  ? ?History   ?Chief Complaint ?Chief Complaint  ?Patient presents with  ? Otalgia  ? ? ?HPI ?April Raymond is a 61 y.o. female.  ? ?HPI ?Patient presents for evaluation of left ear pain x 3 days. Patient was seen eight days ago and treated for acute cough and COPD exacerbation. ?She was treated with antibiotics and steroids and reports improvement of her symptoms related to her URI illness however her ear pain has not improved.  She reports the ear pain is involving the inner ear and is shooting down the back of the left side of her neck.  She is unable to wear her hearing aids in the left ear due to pain and also her hearing is distorted.  She was concerned that she may have cerumen impaction but reports this feels more painful.  She is afebrile and has no history of recurrent ear infections ?Past Medical History:  ?Diagnosis Date  ? Alpha-1-antitrypsin deficiency carrier 02/20/2021  ? MZ phenotype  ? Anginal pain ([place])   ? CAD (coronary artery disease)   ? a. 2017 s/p prior MI & PCI RCA ([place]); b. 05/2020 Abnl ETT: 1.5-57m horizontal ST dep in inflat leads @ peak stress w/ freq PVCs/couplets/bigeminy. HTN response; b. 05/2020 NSTEMI/Cath: LM nl, LAD 444mLCX 20, RCA 35ost, patent prev placed prox-dist stent. EF 25-35% w/ apical ballooning.  ? COPD (chronic obstructive pulmonary disease) (HCFlowing Springs  ? Hashimoto's disease   ? Hidradenitis suppurativa   ? Hydradenitis   ? Hyperparathyroidism (HCBarranquitas  ? Hypertension   ? [facility]  ? Pericardial effusion   ? a. 05/2020 Echo: sm to mod circumferential peric eff w/o tamponade; b. 07/2020 Mod peric effusion; c. 09/2020 Echo: EF 60-65%, no rwma, small tomod effusion w/o compromise.  ? Takotsubo cardiomyopathy   ? a. 05/2020 Echo: EF 35-40%, glob HK, though basal wall motion best preserved consistent w/ stress induced CM. Gr1 DD. Nl RV size/fxn. Sm to mod  circumferential pericardial effusion w/o tamponade.  ? ? ?Patient Active Problem List  ? Diagnosis Date Noted  ? Cervicalgia 11/19/2021  ? DDD (degenerative disc disease), cervical 11/19/2021  ? Grade 1 Anterolisthesis of cervical spine (C7/T1) 11/19/2021  ? Grade 1 Anterolisthesis of thoracic spine (T2/T3) 11/19/2021  ? 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  ? Chronic pain syndrome 11/17/2021  ? Chronic upper back pain (1ry area of Pain) (Right) 10/22/2021  ? Chronic thoracic back pain (Bilateral) (R>L) 10/22/2021  ? Chronic neck pain (2ry area of Pain) (Bilateral) (R>L) 10/22/2021  ? Chronic shoulder pain (3ry area of Pain) (Bilateral) 10/22/2021  ? Painful cervical range of motion 10/22/2021  ? Impaired range of motion of cervical spine 10/22/2021  ? Spasm of thoracic back muscle 10/22/2021  ? CKD (chronic kidney disease) stage 3, GFR 30-59 ml/min (HCC) 09/08/2021  ? H/O lipoma 05/09/2021  ? Pericardial effusion 05/02/2021  ? Alpha-1-antitrypsin deficiency carrier 02/20/2021  ? Moderate COPD (chronic obstructive pulmonary disease) (HCDubois04/29/2022  ? PAD (peripheral artery disease) (HCTint[place]01/03/2021  ? Nonischemic cardiomyopathy (HCAppalach[place]09/15/2021  ? Former smoker 09/30/2019  ? History of MI (myocardial infarction) 09/30/2019  ? History of parathyroidectomy (HCPhilom[place]01/14/2021  ? Coronary artery disease of native artery of native heart with stable angina pectoris (HCMuttontown01/14/2021  ? Essential hypertension 09/30/2019  ? Hyperlipidemia LDL goal <70 09/30/2019  ? Hashimoto's disease 09/30/2019  ?  Vaginal atrophy 09/30/2019  ? Hidradenitis suppurativa 09/30/2019  ? ? ?Past Surgical History:  ?Procedure Laterality Date  ? BREAST BIOPSY Right 05/04/2021  ? stereo x clip BENIGN BREAST TISSUE WITH MILD STROMAL FIBROSIS AND HEMORRHAGE  ? BREAST CYST ASPIRATION Right 05/01/2021  ? BREAST EXCISIONAL BIOPSY Left   ? age 61's  ? CARDIAC CATHETERIZATION    ? CHEST WALL RECONSTRUCTION Right 05/09/2021  ? Procedure: CHEST WALL MASS  RESECTION;  Surgeon: [name], MD;  Location: [facility];  Service: Thoracic;  Laterality: Right;  posterior  ? CYST EXCISION  x2  ? LEFT HEART CATH AND CORONARY ANGIOGRAPHY N/A 05/31/2020  ? Procedure: LEFT HEART CATH AND CORONARY ANGIOGRAPHY;  Surgeon: Nelva Bush, MD;  Location: [facility];  Service: Cardiovascular;  Laterality: N/A;  ? LIPOMA EXCISION    ? PARATHYROIDECTOMY    ? TONSILLECTOMY    ? ? ?OB History   ?No obstetric history on file. ?  ? ? ? ?Home Medications   ? ?Prior to Admission medications   ?Medication Sig Start Date End Date Taking? Authorizing Provider  ?Albuterol Sulfate (PROAIR RESPICLICK) 027 (90 Base) MCG/ACT AEPB Inhale 2 puffs into the lungs every 6 (six) hours as needed (sob/wheezing). 12/04/20   Tyler Pita, MD  ?atorvastatin (LIPITOR) 80 MG tablet Take 1 tablet (80 mg total) by mouth daily. 09/12/21   Cannady, Henrine Screws T, NP  ?azithromycin (ZITHROMAX) 250 MG tablet Take 1 tablet (250 mg total) by mouth daily. Take first 2 tablets together, then 1 every day until finished. 11/23/21   Sharion Balloon, NP  ?furosemide (LASIX) 20 MG tablet Take 1 tablet (20 mg total) by mouth daily as needed (wt gain / edema). 11/07/21   End, Harrell Gave, MD  ?levothyroxine (SYNTHROID) 75 MCG tablet TAKE 1 TABLET BY MOUTH DAILY BEFORE BREAKFAST. 05/14/21   Cannady, Henrine Screws T, NP  ?losartan (COZAAR) 25 MG tablet Take 0.5 tablets (12.5 mg total) by mouth daily. 09/12/21   Marnee Guarneri T, NP  ?metoprolol succinate (TOPROL-XL) 25 MG 24 hr tablet Take 0.5 tablets (12.5 mg total) by mouth daily. 09/12/21   Cannady, Henrine Screws T, NP  ?nitroGLYCERIN (NITROSTAT) 0.4 MG SL tablet Place 1 tablet (0.4 mg total) under the tongue every 5 (five) minutes as needed for chest pain. 11/05/19 07/12/29  End, Harrell Gave, MD  ?pantoprazole (PROTONIX) 40 MG tablet Take 1 tablet (40 mg total) by mouth daily. 09/12/21   Cannady, Henrine Screws T, NP  ?PREMARIN vaginal cream PLACE 0.5 GRAMS TWICE WEEKLY INTRAVAGINALLY.  05/14/21   Marnee Guarneri T, NP  ?ticagrelor (BRILINTA) 60 MG TABS tablet TAKE 1 TABLET (60 MG TOTAL) BY MOUTH 2 (TWO) TIMES DAILY. 09/12/21   Cannady, Henrine Screws T, NP  ?umeclidinium-vilanterol (ANORO ELLIPTA) 62.5-25 MCG/INH AEPB Inhale 1 puff into the lungs daily. 02/19/21   Tyler Pita, MD  ? ? ?Family History ?Family History  ?Problem Relation Age of Onset  ? Heart attack Mother 67  ? COPD Mother   ? Stroke Mother   ? Diverticulitis Mother   ? Cancer Mother   ?     breast  ? Hypertension Mother   ? Breast cancer Mother 12  ? Hypertension Father   ? Stroke Maternal Grandfather   ? Alzheimer's disease Paternal Grandmother   ? Heart attack Paternal Grandfather   ? Breast cancer Cousin   ?     mat cousin x 2  ? Hypertension Brother   ? ? ?Social History ?Social History  ? ?  Tobacco Use  ? Smoking status: Former  ?  Packs/day: 1.00  ?  Years: 40.00  ?  Pack years: 40.00  ?  Types: Cigarettes  ?  Quit date: 09/18/2015  ?  Years since quitting: 6.2  ? Smokeless tobacco: Never  ?Vaping Use  ? Vaping Use: Never used  ?Substance Use Topics  ? Alcohol use: Yes  ?  Comment: on occasion  ? Drug use: Never  ? ? ? ?Allergies   ?Humira [adalimumab] and Tape ?Review of Systems ?Review of Systems ?Pertinent negatives listed in HPI  ? ?Physical Exam ?Triage Vital Signs ?ED Triage Vitals [12/01/21 0835]  ?Enc Vitals Group  ?   BP 122/77  ?   Pulse Rate 80  ?   Resp 16  ?   Temp 98 ?F (36.7 ?C)  ?   Temp Source Oral  ?   SpO2 97 %  ?   Weight   ?   Height   ?   Head Circumference   ?   Peak Flow   ?   Pain Score 3  ?   Pain Loc   ?   Pain Edu?   ?   Excl. in [place]?   ? ?No data found. ? ?Updated Vital Signs ?BP 122/77 (BP Location: Right Arm)   Pulse 80   Temp 98 ?F (36.7 ?C) (Oral)   Resp 16   LMP 11/16/2007 (Approximate)   SpO2 97%  ? ?Visual Acuity ?Right Eye Distance:   ?Left Eye Distance:   ?Bilateral Distance:   ? ?Right Eye Near:   ?Left Eye Near:    ?Bilateral Near:    ? ?Physical Exam ?Constitutional:   ?   Appearance:  Normal appearance.  ?HENT:  ?   Head: Normocephalic.  ?   Right Ear: Hearing, tympanic membrane, ear canal and external ear normal.  ?   Left Ear: Decreased hearing noted. A middle ear effusion is present. Tympanic membran

## 2021-12-01 NOTE — ED Triage Notes (Signed)
Pt c/o left ear pain x3 days.  

## 2021-12-01 NOTE — Discharge Instructions (Signed)
Discontinue loratadine for at least 90 days and switch to cetirizine 10 mg at bedtime for management of eustachian tube dysfunction which is caused by fluid in the left ear.  This will also prevent ear nose and throat symptoms related to outdoor allergens.  Use Bromfed nasal spray up to 3 times daily as needed for runny nose and for ear fluid.  If symptoms have not significantly improved within 5 days follow-up with an ENT. ?

## 2021-12-04 ENCOUNTER — Other Ambulatory Visit: Payer: Self-pay

## 2021-12-04 ENCOUNTER — Ambulatory Visit: Payer: BLUE CROSS/BLUE SHIELD

## 2021-12-04 DIAGNOSIS — M546 Pain in thoracic spine: Secondary | ICD-10-CM

## 2021-12-04 DIAGNOSIS — M542 Cervicalgia: Secondary | ICD-10-CM

## 2021-12-04 DIAGNOSIS — R293 Abnormal posture: Secondary | ICD-10-CM

## 2021-12-04 NOTE — Patient Instructions (Signed)
Access Code: [email or web address] ?URL: [email or web address] ?Date: 12/04/2021 ?Prepared by: Joneen Boers ? ?Exercises ?Supine Chin Tuck - 1 x daily - 7 x weekly - 2 sets - 20 reps ?Seated Cervical Sidebending Stretch - 1 x daily - 7 x weekly - 2 sets - 3 reps - 30 hold ?Seated Scapular Retraction - 1 x daily - 7 x weekly - 2 sets - 20 reps ?Standing Lumbar Spine Flexion Stretch Counter - 1 x daily - 7 x weekly - 2 sets - 3 reps - 30 hold ?Seated Flexion Stretch - 1 x daily - 7 x weekly - 1 sets - 10 reps - 10 seconds hold ?Seated Sidebending - 3 x daily - 7 x weekly - 1 sets - 10 reps - 5 seconds hold ?First Rib Mobilization with Strap - 3 x daily - 7 x weekly - 1 sets - 5 reps - 15 seconds hold ? ?

## 2021-12-04 NOTE — Therapy (Signed)
?OUTPATIENT PHYSICAL THERAPY TREATMENT NOTE ? ? ?Patient Name: April Raymond ?MRN: [id number] ?DOB:[birth year], 61 y.o., female ?Today's Date: 12/04/2021 ? ?PCP: Venita Lick, NP ?REFERRING PROVIDER: [name], MD ? ? PT End of Session - 12/04/21 1112   ? ? Visit Number 9   ? Number of Visits 17   ? Date for PT Re-Evaluation 12/10/21   ? PT Start Time 1112   ? PT Stop Time 9211   ? PT Time Calculation (min) 40 min   ? Activity Tolerance Patient tolerated treatment well   ? Behavior During Therapy [facility] for tasks assessed/performed   ? ?  ?  ? ?  ? ? ?Past Medical History:  ?Diagnosis Date  ? Alpha-1-antitrypsin deficiency carrier 02/20/2021  ? MZ phenotype  ? Anginal pain ([place])   ? CAD (coronary artery disease)   ? a. 2017 s/p prior MI & PCI RCA ([place]); b. 05/2020 Abnl ETT: 1.5-33m horizontal ST dep in inflat leads @ peak stress w/ freq PVCs/couplets/bigeminy. HTN response; b. 05/2020 NSTEMI/Cath: LM nl, LAD 416mLCX 20, RCA 35ost, patent prev placed prox-dist stent. EF 25-35% w/ apical ballooning.  ? COPD (chronic obstructive pulmonary disease) ([place]  ? Hashimoto's disease   ? Hidradenitis suppurativa   ? Hydradenitis   ? [place]parathy[place] ([place]  ? Hypertension   ? [facility]  ? Pericardial effusion   ? a. 05/2020 Echo: sm to mod circumferential peric eff w/o tamponade; b. 07/2020 Mod peric effusion; c. 09/2020 Echo: EF 60-65%, no rwma, small tomod effusion w/o compromise.  ? Takotsubo cardiomyopathy   ? a. 05/2020 Echo: EF 35-40%, glob HK, though basal wall motion best preserved consistent w/ stress induced CM. Gr1 DD. Nl RV size/fxn. Sm to mod circumferential pericardial effusion w/o tamponade.  ? ?Past Surgical History:  ?Procedure Laterality Date  ? BREAST BIOPSY Right 05/04/2021  ? stereo x clip BENIGN BREAST TISSUE WITH MILD STROMAL FIBROSIS AND HEMORRHAGE  ? BREAST CYST ASPIRATION Right 05/01/2021  ? BREAST EXCISIONAL BIOPSY Left   ? age 61's? CARDIAC CATHETERIZATION     ? CHEST WALL RECONSTRUCTION Right 05/09/2021  ? Procedure: CHEST WALL MASS RESECTION;  Surgeon: LiLajuana MatteMD;  [place] Service: Thoracic;  Laterality: Right;  posterior  ? CYST EXCISION  x2  ? LEFT HEART CATH AND CORONARY ANGIOGRAPHY N/A 05/31/2020  ? Procedure: LEFT HEART CATH AND CORONARY ANGIOGRAPHY;  Surgeon: EnNelva BushMD;  [facility] [place];  Service: Cardiovascular;  Laterality: N/A;  ? LIPOMA EXCISION    ? PARATHYROIDECTOMY    ? TONSILLECTOMY    ? ?Patient Active Problem List  ? Diagnosis Date Noted  ? Cervicalgia 11/19/2021  ? DDD (degenerative disc disease), cervical 11/19/2021  ? Grade 1 Anterolisthesis of cervical spine (C7/T1) 11/19/2021  ? Grade 1 Anterolisthesis of thoracic spine (T2/T3) 11/19/2021  ? 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  ? Chronic pain syndrome 11/17/2021  ? Chronic upper back pain (1ry area of Pain) (Right) 10/22/2021  ? Chronic thoracic back pain (Bilateral) (R>L) 10/22/2021  ? Chronic neck pain (2ry area of Pain) (Bilateral) (R>L) 10/22/2021  ? Chronic shoulder pain (3ry area of Pain) (Bilateral) 10/22/2021  ? Painful cervical range of motion 10/22/2021  ? Impaired range of motion of cervical spine 10/22/2021  ? Spasm of thoracic back muscle 10/22/2021  ? CKD (chronic kidney disease) stage 3, GFR 30-59 ml/min (HCC) 09/08/2021  ? H/O lipoma 05/09/2021  ? Pericardial  effusion 05/02/2021  ? Alpha-1-antitrypsin deficiency carrier 02/20/2021  ? Moderate COPD (chronic obstructive pulmonary disease) ([place]) 01/12/2021  ? PAD (peripheral artery disease) ([place]) 09/22/2020  ? Nonischemic cardiomyopathy ([place]) 05/31/2020  ? Former smoker 09/30/2019  ? History of MI (myocardial infarction) 09/30/2019  ? History of parathyroidectomy ([place]) 09/30/2019  ? Coronary artery disease of native artery of native heart with stable angina pectoris ([place]) 09/30/2019  ? Essential hypertension 09/30/2019  ? Hyperlipidemia LDL goal <70 09/30/2019  ? Hashimoto's  disease 09/30/2019  ? Vaginal atrophy 09/30/2019  ? Hidradenitis suppurativa 09/30/2019  ? ? ?REFERRING DIAG: Thoracic pain ? ?THERAPY DIAG:  ?Pain in thoracic spine ? ?Abnormal posture ? ?Cervicalgia ? ?PERTINENT HISTORY: Pt is a 61 y.o. female referred to OP PT due to thoracic back pain s/p Lipoma removal on 05/09/21 on R side since after surgery. PMH inlcudes: CAD, COPD, Hashimoto's disease, HTN, takotsubo cardiomyopathy. Pt's pain is described as aching and burning with what she describes as back spasms since heoperation. Worst pain reported as a 5/10 NPS lately, but has been up to a 9/10 around 2 months ago. Currently pain at 4-5/10 NPS. Pain located between B shoulder blades, upper traps, and cervical paraspinals. Does report B referring pain to lateral aspects of upper arms at deltoids, denies N/T througohut UE's. Throughout 24 hours, neck pain wakes pt up peridocially. Back becomes worse as day goes on with activities such as yard work such as Freight forwarder, any global activity such as walking on treadmill. Pain is improved with heat, pain meds but has stopped taking pain meds. Pt denies B/B changes, significant weight loss, unrelenting night pain. Pt's goal is to improve pain and get back to exercising daily ? ?PRECAUTIONS: No known precautions ? ?SUBJECTIVE: Neck and midback are doing good. Currently having pain L upper trap area. Pt also had prednisone for her bronchitis which helped. 4/10 L upper trap pain currently. ? ?PAIN:  ?Are you having pain? Yes: NPRS scale: 4/10 ?Pain location: L upper trap ? ? ? ? ?Objectives ? ? ?12/04/2021 ? ?Therapeutic exercise ? ?Cervical AROM  ?Extension 72 degrees, no pain ?Rotation ?R: 70 degrees no pain ?L:  65 degrees no pain ? ? Flexion, SB (R and L), rotation (R and L), extension: no reproduction of L upper trap pain with over pressure.  ? ?B shoulder shrug: increased L upper trap pain ? ?L shoulder extension with scapular retraction red band 10x5 seconds for 2 sets ? Then  green band 10x5 seconds  ? ?Supine chin tucks with scapular retraction 10x5 seconds  ? ?Supine scaption yellow band L UE 10x3 to promote lower trap muscle activation.  ? ?Standing B scapular retraction green band 10x5 seconds for 2 sets ? ? ?  ?Improved exercise technique, movement at target joints, use of target muscles after min to mod verbal, visual, tactile cues.  ?  ?  ?  ?Manual Therapy  ?            seated STM L upper trap muscle to decrease tension ? ?  ? ? ? ?Response to treatment ?Pt tolerated session well without aggravation of symptoms.  No change in L upper trap symptoms after session.  ?  ? ? ? ?PATIENT EDUCATION: ?Education details: ther-ex ?Person educated: Patient ?Education method: Explanation, Demonstration, Tactile cues, and Verbal cues ?Education comprehension: returned demonstration ? ? ?HOME EXERCISE PROGRAM: ?Access Code: [email or web address] ?URL: [email or web address] ?Date: 12/04/2021 ?Prepared by: Joneen Boers ? ?Exercises ?Supine Chin Tuck - 1 x daily -  7 x weekly - 2 sets - 20 reps ?Seated Cervical Sidebending Stretch - 1 x daily - 7 x weekly - 2 sets - 3 reps - 30 hold ?Seated Scapular Retraction - 1 x daily - 7 x weekly - 2 sets - 20 reps ?Standing Lumbar Spine Flexion Stretch Counter - 1 x daily - 7 x weekly - 2 sets - 3 reps - 30 hold ?Seated Flexion Stretch - 1 x daily - 7 x weekly - 1 sets - 10 reps - 10 seconds hold ?Seated Sidebending - 3 x daily - 7 x weekly - 1 sets - 10 reps - 5 seconds hold ?First Rib Mobilization with Strap - 3 x daily - 7 x weekly - 1 sets - 5 reps - 15 seconds hold ? ? PT Short Term Goals - 10/16/21 0815   ? ?  ? PT SHORT TERM GOAL #1  ? Title Pt will be independent with HEP to improve pain and cervical AROM for ADL completion   ? Baseline 10/15/21: initiated   ? Time 4   ? Period Weeks   ? Status New   ? Target Date 11/12/21   ? ?  ?  ? ?  ? ? ? PT Long Term Goals - 10/16/21 0816   ? ?  ? PT LONG TERM GOAL #1  ? Title Pt will improve cervical AROM in  extension and rotation by 10 deg to demonstrate clinically significant improvement in mobility.   ? Baseline 10/15/21: extension 32 deg, R/L rotation 60 deg/60 deg   ? Time 8   ? Period Weeks   ? Status New   ?

## 2021-12-05 NOTE — Therapy (Signed)
?OUTPATIENT PHYSICAL THERAPY TREATMENT NOTE/Progress Note  ? ? ?Dates of reporting period  10/15/21   to   12/06/21  ? ? ?Patient Name: April Raymond ?MRN: [id number] ?DOB:[birth year], 61 y.o., female ?Today's Date: 12/06/2021 ? ?PCP: Venita Lick, NP ?REFERRING PROVIDER: [name], MD ? ? PT End of Session - 12/06/21 1108   ? ? Visit Number 10   ? Number of Visits 17   ? Date for PT Re-Evaluation 12/10/21   ? PT Start Time 1105   ? PT Stop Time 1145   ? PT Time Calculation (min) 40 min   ? Activity Tolerance Patient tolerated treatment well   ? Behavior During Therapy [facility] for tasks assessed/performed   ? ?  ?  ? ?  ? ? ? ?Past Medical History:  ?Diagnosis Date  ? Alpha-1-antitrypsin deficiency carrier 02/20/2021  ? MZ phenotype  ? Anginal pain ([place])   ? CAD (coronary artery disease)   ? a. 2017 s/p prior MI & PCI RCA ([place]); b. 05/2020 Abnl ETT: 1.5-5m horizontal ST dep in inflat leads @ peak stress w/ freq PVCs/couplets/bigeminy. HTN response; b. 05/2020 NSTEMI/Cath: LM nl, LAD 491mLCX 20, RCA 35ost, patent prev placed prox-dist stent. EF 25-35% w/ apical ballooning.  ? [place] ([place] disease) [place]  ? Hashimoto's disease   ? Hidradenitis suppurativa   ? Hydradenitis   ? [place] ([place]  ? Hypertension   ? [facility]  ? Pericardial effusion   ? a. 05/2020 Echo: sm to mod circumferential peric eff w/o tamponade; b. 07/2020 Mod peric effusion; c. 09/2020 Echo: EF 60-65%, no rwma, small tomod effusion w/o compromise.  ? Takotsubo cardiomyopathy   ? a. 05/2020 Echo: EF 35-40%, glob HK, though basal wall motion best preserved consistent w/ stress induced CM. Gr1 DD. Nl RV size/fxn. Sm to mod circumferential pericardial effusion w/o tamponade.  ? ?Past Surgical History:  ?Procedure Laterality Date  ? BREAST BIOPSY Right 05/04/2021  ? stereo x clip BENIGN BREAST TISSUE WITH MILD STROMAL FIBROSIS AND HEMORRHAGE  ? BREAST CYST ASPIRATION Right 05/01/2021   ? BREAST EXCISIONAL BIOPSY Left   ? age 61's? CARDIAC CATHETERIZATION    ? CHEST WALL RECONSTRUCTION Right 05/09/2021  ? Procedure: CHEST WALL MASS RESECTION;  Surgeon: LiLajuana MatteMD;  [place] Service: Thoracic;  Laterality: Right;  posterior  ? CYST EXCISION  x2  ? LEFT HEART CATH AND CORONARY ANGIOGRAPHY N/A 05/31/2020  ? Procedure: LEFT HEART CATH AND CORONARY ANGIOGRAPHY;  Surgeon: EnNelva BushMD;  [facility];  Service: Cardiovascular;  Laterality: N/A;  ? LIPOMA EXCISION    ? PARATHYROIDECTOMY    ? TONSILLECTOMY    ? ?Patient Active Problem List  ? Diagnosis Date Noted  ? Cervicalgia 11/19/2021  ? DDD (degenerative disc disease), cervical 11/19/2021  ? Grade 1 Anterolisthesis of cervical spine (C7/T1) 11/19/2021  ? Grade 1 Anterolisthesis of thoracic spine (T2/T3) 11/19/2021  ? 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  ? Chronic pain syndrome 11/17/2021  ? Chronic upper back pain (1ry area of Pain) (Right) 10/22/2021  ? Chronic thoracic back pain (Bilateral) (R>L) 10/22/2021  ? Chronic neck pain (2ry area of Pain) (Bilateral) (R>L) 10/22/2021  ? Chronic shoulder pain (3ry area of Pain) (Bilateral) 10/22/2021  ? Painful cervical range of motion 10/22/2021  ? Impaired range of motion of cervical spine 10/22/2021  ? Spasm of thoracic back muscle 10/22/2021  ? CKD (  chronic kidney disease) stage 3, GFR 30-59 ml/min (HCC) 09/08/2021  ? H/O lipoma 05/09/2021  ? Pericardial effusion 05/02/2021  ? Alpha-1-antitrypsin deficiency carrier 02/20/2021  ? Moderate COPD (chronic obstructive pulmonary disease) ([place]) 01/12/2021  ? PAD (peripheral artery disease) ([place]) 09/22/2020  ? Nonischemic cardiomyopathy ([place]) 05/31/2020  ? Former smoker 09/30/2019  ? History of MI (myocardial infarction) 09/30/2019  ? History of parathyroidectomy ([place]) 09/30/2019  ? Coronary artery disease of native artery of native heart with stable angina pectoris ([place]) 09/30/2019  ? Essential hypertension  09/30/2019  ? Hyperlipidemia LDL goal <70 09/30/2019  ? Hashimoto's disease 09/30/2019  ? Vaginal atrophy 09/30/2019  ? Hidradenitis suppurativa 09/30/2019  ? ? ?REFERRING DIAG: Thoracic pain ? ?THERAPY DIAG:  ?Pain in thoracic spine ? ?Abnormal posture ? ?Cervicalgia ? ?PERTINENT HISTORY: Pt is a 61 y.o. female referred to OP PT due to thoracic back pain s/p Lipoma removal on 05/09/21 on R side since after surgery. PMH inlcudes: CAD, COPD, Hashimoto's disease, HTN, takotsubo cardiomyopathy. Pt's pain is described as aching and burning with what she describes as back spasms since heoperation. Worst pain reported as a 5/10 NPS lately, but has been up to a 9/10 around 2 months ago. Currently pain at 4-5/10 NPS. Pain located between B shoulder blades, upper traps, and cervical paraspinals. Does report B referring pain to lateral aspects of upper arms at deltoids, denies N/T througohut UE's. Throughout 24 hours, neck pain wakes pt up peridocially. Back becomes worse as day goes on with activities such as yard work such as Freight forwarder, any global activity such as walking on treadmill. Pain is improved with heat, pain meds but has stopped taking pain meds. Pt denies B/B changes, significant weight loss, unrelenting night pain. Pt's goal is to improve pain and get back to exercising daily ? ?PRECAUTIONS: No known precautions ? ?SUBJECTIVE: Pt reports traveling pain from L shoulder blade, neck feels "great". ?PAIN:  ?Are you having pain?: L shoulder blade/upper 4/10 NPS. ? ? ? ? ?Objectives ? ? ?12/06/2021 ? ?Therapeutic exercise ? ?Reassessment of goals for POC. Pt making progress towards all goals. Please see clinical impression and goals section for details.  ? ?Supine chin tucks: x20  ? ?Standing scap retractions with blue TB: 2x20, min VC's for decreasing UT activation  ? ?Standing shoulder extension with GTB: 2x20 with min VC's for decreasing UT activation ? ?Standing Y's on wall for Lower trap activation: x20 with initial  education on form/technique with PT demo. Great understanding. ? ? ? ? ?Manual Therapy: 8 minutes total  ?            Supine with moist heat on L upper trap. L upper trap stretch with R cervical rotation: 4x1 min holds. 3x1 min L levator trap stretch ?  ? ? ? ?Response to treatment ?Pt tolerated session well without aggravation of symptoms.  No change in L upper trap symptoms after session.  ?  ?Clinical Impression ?Performance of progress note as pt at tenth visit. Pt progressing towards all long term goals but yet to achieve any goals. Pt's cervical AROM has improved by at least 10 deg in tested planes except with L cervical rotation but that has improved by 5 degrees. Pt also does not have any cervical or mid back pain with exercise and ADL tasks but now is having L shoulder pain with said tasks. Pt also improving FOTO score towards target score. Focus of remainder of session on decreasing L shoulder/upper trap pain with focus  on periscapular strengthening to reduce upper trap over activation. Pt continuing to progress in PT but will continue to benefit from treatment to improve pain and accomplish all goals per POC. ? ? ?PATIENT EDUCATION: ?Education details: ther-ex, progress towards goals ?Person educated: Patient ?Education method: Explanation, Demonstration, Tactile cues, and Verbal cues ?Education comprehension: returned demonstration ? ? ?HOME EXERCISE PROGRAM: ?Access Code: [email or web address] ?URL: [email or web address] ?Date: 12/04/2021 ?Prepared by: Joneen Boers ? ?Exercises ?Supine Chin Tuck - 1 x daily - 7 x weekly - 2 sets - 20 reps ?Seated Cervical Sidebending Stretch - 1 x daily - 7 x weekly - 2 sets - 3 reps - 30 hold ?Seated Scapular Retraction - 1 x daily - 7 x weekly - 2 sets - 20 reps ?Standing Lumbar Spine Flexion Stretch Counter - 1 x daily - 7 x weekly - 2 sets - 3 reps - 30 hold ?Seated Flexion Stretch - 1 x daily - 7 x weekly - 1 sets - 10 reps - 10 seconds hold ?Seated Sidebending -  3 x daily - 7 x weekly - 1 sets - 10 reps - 5 seconds hold ?First Rib Mobilization with Strap - 3 x daily - 7 x weekly - 1 sets - 5 reps - 15 seconds hold ? ? PT Short Term Goals - 12/06/21 1108   ? ?

## 2021-12-06 ENCOUNTER — Other Ambulatory Visit: Payer: Self-pay

## 2021-12-06 ENCOUNTER — Ambulatory Visit: Payer: BLUE CROSS/BLUE SHIELD

## 2021-12-06 DIAGNOSIS — M546 Pain in thoracic spine: Secondary | ICD-10-CM

## 2021-12-06 DIAGNOSIS — R293 Abnormal posture: Secondary | ICD-10-CM

## 2021-12-06 DIAGNOSIS — M542 Cervicalgia: Secondary | ICD-10-CM

## 2021-12-09 NOTE — Patient Instructions (Signed)
Tinnitus ?Tinnitus refers to hearing a sound when there is no actual source for that sound. This is often described as ringing in the ears. However, people with this condition may hear a variety of noises, in one ear or in both ears. ?The sounds of tinnitus can be soft, loud, or somewhere in between. Tinnitus can last for a few seconds or can be constant for days. It may go away without treatment and come back at various times. When tinnitus is constant or happens often, it can lead to other problems, such as trouble sleeping and trouble concentrating. ?Almost everyone experiences tinnitus at some point. Tinnitus is not the same as hearing loss. Tinnitus that is long-lasting (chronic) or comes back often (recurs) may require medical attention. ?What are the causes? ?The cause of tinnitus is often not known. In some cases, it can result from: ?Exposure to loud noises from machinery, music, or other sources. ?An object (foreign body) stuck in the ear. ?Earwax buildup. ?Drinking alcohol or caffeine. ?Taking certain medicines. ?Age-related hearing loss. ?It may also be caused by medical conditions such as: ?Ear or sinus infections. ?Heart diseases or high blood pressure. ?Allergies. ?M?ni?re's disease. ?Thyroid problems. ?Tumors. ?A weak, bulging blood vessel (aneurysm) near the ear. ?What increases the risk? ?The following factors may make you more likely to develop this condition: ?Exposure to loud noises. ?Age. Tinnitus is more likely in older individuals. ?Using alcohol or tobacco. ?What are the signs or symptoms? ?The main symptom of tinnitus is hearing a sound when there is no source for that sound. It may sound like: ?Buzzing. ?Sizzling. ?Ringing. ?Blowing air. ?Hissing. ?Whistling. ?Other sounds may include: ?Roaring. ?Running water. ?A musical note. ?Tapping. ?Humming. ?Symptoms may affect only one ear (unilateral) or both ears (bilateral). ?How is this diagnosed? ?Tinnitus is diagnosed based on your symptoms,  your medical history, and a physical exam. Your health care provider may do a thorough hearing test (audiologic exam) if your tinnitus: ?Is unilateral. ?Causes hearing difficulties. ?Lasts 6 months or longer. ?You may work with a health care provider who specializes in hearing disorders (audiologist). You may be asked questions about your symptoms and how they affect your daily life. You may have other tests done, such as: ?CT scan. ?MRI. ?An imaging test of how blood flows through your blood vessels (angiogram). ?How is this treated? ?Treating an underlying medical condition can sometimes make tinnitus go away. If your tinnitus continues, other treatments may include: ?Therapy and counseling to help you manage the stress of living with tinnitus. ?Sound generators to mask the tinnitus. These include: ?Tabletop sound machines that play relaxing sounds to help you fall asleep. ?Wearable devices that fit in your ear and play sounds or music. ?Acoustic neural stimulation. This involves using headphones to listen to music that contains an auditory signal. Over time, listening to this signal may change some pathways in your brain and make you less sensitive to tinnitus. This treatment is used for very severe cases when no other treatment is working. ?Using hearing aids or cochlear implants if your tinnitus is related to hearing loss. Hearing aids are worn in the outer ear. Cochlear implants are surgically placed in the inner ear. ?Follow these instructions at home: ?Managing symptoms ?  ?When possible, avoid being in loud places and being exposed to loud sounds. ?Wear hearing protection, such as earplugs, when you are exposed to loud noises. ?Use a white noise machine, a humidifier, or other devices to mask the sound of tinnitus. ?Practice techniques  for reducing stress, such as meditation, yoga, or deep breathing. Work with your health care provider if you need help with managing stress. ?Sleep with your head slightly  raised. This may reduce the impact of tinnitus. ?General instructions ?Do not use stimulants, such as nicotine, alcohol, or caffeine. Talk with your health care provider about other stimulants to avoid. Stimulants are substances that can make you feel alert and attentive by increasing certain activities in the body (such as heart rate and blood pressure). These substances may make tinnitus worse. ?Take over-the-counter and prescription medicines only as told by your health care provider. ?Try to get plenty of sleep each night. ?Keep all follow-up visits. This is important. ?Contact a health care provider if: ?Your tinnitus continues for 3 weeks or longer without stopping. ?You develop sudden hearing loss. ?Your symptoms get worse or do not get better with home care. ?You feel you are not able to manage the stress of living with tinnitus. ?Get help right away if: ?You develop tinnitus after a head injury. ?You have tinnitus along with any of the following: ?Dizziness. ?Nausea and vomiting. ?Loss of balance. ?Sudden, severe headache. ?Vision changes. ?Facial weakness or weakness of arms or legs. ?These symptoms may represent a serious problem that is an emergency. Do not wait to see if the symptoms will go away. Get medical help right away. Call your local emergency services (911 in the [place] Do not drive yourself to the hospital. ?Summary ?Tinnitus refers to hearing a sound when there is no actual source for that sound. This is often described as ringing in the ears. ?Symptoms may affect only one ear (unilateral) or both ears (bilateral). ?Use a white noise machine, a humidifier, or other devices to mask the sound of tinnitus. ?Do not use stimulants, such as nicotine, alcohol, or caffeine. These substances may make tinnitus worse. ?This information is not intended to replace advice given to you by your health care provider. Make sure you discuss any questions you have with your health care provider. ?Document  Revised: 08/07/2020 Document Reviewed: 08/07/2020 ?Elsevier Patient Education ? [place]. ? ?

## 2021-12-11 ENCOUNTER — Ambulatory Visit: Payer: BLUE CROSS/BLUE SHIELD

## 2021-12-12 ENCOUNTER — Encounter: Payer: Self-pay | Admitting: Nurse Practitioner

## 2021-12-12 ENCOUNTER — Ambulatory Visit: Payer: No Typology Code available for payment source | Admitting: Nurse Practitioner

## 2021-12-12 VITALS — BP 119/81 | HR 82 | Temp 98.0°F | Wt 145.2 lb

## 2021-12-12 DIAGNOSIS — H9312 Tinnitus, left ear: Secondary | ICD-10-CM

## 2021-12-12 DIAGNOSIS — R21 Rash and other nonspecific skin eruption: Secondary | ICD-10-CM | POA: Diagnosis not present

## 2021-12-12 MED ORDER — TRIAMCINOLONE ACETONIDE 0.1 % EX CREA: 1.0000 "application " | TOPICAL_CREAM | Freq: Two times a day (BID) | CUTANEOUS | 0 refills | Status: DC

## 2021-12-12 MED ORDER — METHYLPREDNISOLONE 4 MG PO TBPK: ORAL_TABLET | ORAL | 0 refills | Status: DC

## 2021-12-12 NOTE — Assessment & Plan Note (Signed)
Itchy rash noted over entire trunk and upper leg/thigh. Started 2 weeks ago. Ordered methylprednisolone 4 mg , patient instructed to follow package instructions. Also ordered triamcinolone cream 0.1% to apply to rash twice a day. Return in two weeks or sooner if symptoms worsens. ?

## 2021-12-12 NOTE — Progress Notes (Signed)
? ?BP 119/81   Pulse 82   Temp 98 ?F (36.7 ?C)   Wt 65.9 kg   LMP 11/16/2007 (Approximate)   SpO2 98%   BMI 23.44 kg/m?   ? ?Subjective:  ? ? Patient ID: April Raymond, female    DOB: 16-Dec-1960, 61 y.o.   MRN: [id number] ? ?NOTE WRITTEN BY [employer] DNP STUDENT.  ASSESSMENT AND PLAN OF CARE REVIEWED WITH STUDENT, AGREE WITH ABOVE FINDINGS AND PLAN. ? ? ?HPI: ?April Raymond is a 61 y.o. female ? ?Chief Complaint  ?Patient presents with  ? Tinnitus  ?  Patient states she recently had a cold that has now cleared up but she now has ringing in her left ear.  ? Urticaria  ?  Patient states she has a rash that is burning and itching. Rash is on her abdomen area, chest, back and arms.   ? ?Patient states rash symptoms started around the day after husband brought chickens in the garage.  Tinnitus has been ongoing since illness which she only recently got over, but she had for 3 weeks.  Tinnitus to left side only, had pink eye on this side too. ? ?TINNITUS ?Duration: weeks 2 weeks ?Description of tinnitus: high-pitch and static ?Pulsatile: no ?Tinnitus duration: continuous ?Episode frequency: continous ?Severity: moderate ?Aggravating factors:  ?Alleviating factors: ?Head injury: no ?Chronic exposure to loud noises: no ?Exposure to ototoxic medications: no  ?Vertigo:no, some occasional dizziness ?Hearing loss: yes baseline ?Aural fullness: no ?Headache: no ?TMJ syndrome symptoms: nono ?Unsteady gait:  ?Postural instability: no ?Diplopia, dysarthria, dysphagia or weakness: no ?Anxietydepression:   ?no ? ?RASH ?Duration:  weeks 2 weeks ?Location: generalized, trunk, and arms (from shoulders to upper thigh) ?Itching: yes ?Burning: no ?Redness: no ?Oozing: no ?Scaling: no ?Blisters: no ?Painful: no ?Fevers: no ?Change in detergents/soaps/personal care products: no ?Recent illness: yes ?Recent travel:no ?History of same: no ?Context: stable ?Alleviating factors: hydrocortisone cream and benadryl ?Treatments  attempted:hydrocortisone cream and benadryl ?Shortness of breath: no  ?Throat/tongue swelling: no ?Myalgias/arthralgias: no  ? ?Relevant past medical, surgical, family and social history reviewed and updated as indicated. Interim medical history since our last visit reviewed. ?Allergies and medications reviewed and updated. ? ?Review of Systems  ?Constitutional:  Negative for activity change, chills, fatigue and fever.  ?HENT:  Positive for tinnitus. Negative for ear discharge, ear pain and rhinorrhea.   ?Eyes:  Negative for photophobia and visual disturbance.  ?Respiratory:  Negative for shortness of breath.   ?Musculoskeletal:  Negative for arthralgias and myalgias.  ?Skin:  Positive for rash.  ?     Itchy rash  ?Neurological:  Negative for light-headedness and headaches.  ? ?Per HPI unless specifically indicated above ? ?   ?Objective:  ?  ?BP 119/81   Pulse 82   Temp 98 ?F (36.7 ?C)   Wt 65.9 kg   LMP 11/16/2007 (Approximate)   SpO2 98%   BMI 23.44 kg/m?   ?Wt Readings from Last 3 Encounters:  ?12/12/21 65.9 kg  ?11/07/21 64.4 kg  ?10/22/21 63.5 kg  ?  ?Physical Exam ?Constitutional:   ?   General: She is not in acute distress. ?   Appearance: Normal appearance. She is well-groomed and normal weight. She is not ill-appearing.  ?HENT:  ?   Head: Normocephalic and atraumatic.  ?   Jaw: No tenderness or swelling.  ?   Salivary Glands: Right salivary gland is not diffusely enlarged or tender. Left salivary gland is not diffusely enlarged or tender.  ?  Right Ear: Tympanic membrane, ear canal and external ear normal.  ?   Left Ear: Ear canal and external ear normal.  ?   Nose: Nose normal. No congestion or rhinorrhea.  ?   Mouth/Throat:  ?   Mouth: Mucous membranes are moist.  ?   Pharynx: Oropharynx is clear.  ?Eyes:  ?   General: Lids are normal. No allergic shiner.    ?   Right eye: No discharge.     ?   Left eye: No discharge.  ?   Conjunctiva/sclera: Conjunctivae normal.  ?Neck:  ?   Thyroid: No  thyromegaly or thyroid tenderness.  ?Cardiovascular:  ?   Rate and Rhythm: Normal rate and regular rhythm.  ?   Pulses: Normal pulses.  ?   Heart sounds: Normal heart sounds. No murmur heard. ?  No gallop.  ?Pulmonary:  ?   Effort: Pulmonary effort is normal. No accessory muscle usage or respiratory distress.  ?   Breath sounds: Normal breath sounds. No wheezing or rhonchi.  ?Musculoskeletal:  ?   Cervical back: Normal range of motion.  ?Lymphadenopathy:  ?   Head:  ?   Right side of head: No submental, submandibular, tonsillar, preauricular, posterior auricular or occipital adenopathy.  ?   Left side of head: No submental, submandibular, tonsillar, preauricular, posterior auricular or occipital adenopathy.  ?   Cervical: No cervical adenopathy.  ?   Right cervical: No superficial, deep or posterior cervical adenopathy. ?   Left cervical: No superficial, deep or posterior cervical adenopathy.  ?   Upper Body:  ?   Right upper body: No supraclavicular adenopathy.  ?   Left upper body: No supraclavicular adenopathy.  ?Skin: ?   General: Skin is warm and dry.  ?   Findings: Rash present. Rash is not papular.  ?   Comments: Generalized rash on trunk and upper leg/thigh  ?Neurological:  ?   Mental Status: She is alert and oriented to person, place, and time.  ?Psychiatric:     ?   Attention and Perception: Attention normal.     ?   Mood and Affect: Mood and affect normal.     ?   Speech: Speech normal.     ?   Behavior: Behavior normal. Behavior is cooperative.     ?   Thought Content: Thought content normal.     ?   Judgment: Judgment normal.  ? ? ?Results for orders placed or performed during the hospital encounter of 11/23/21  ?COVID-19, Flu A+B and RSV ([facility])  ?Result Value Ref Range  ? SARS-CoV-2, NAA Not Detected Not Detected  ? Influenza A, NAA Not Detected Not Detected  ? Influenza B, NAA Not Detected Not Detected  ? RSV, NAA Not Detected Not Detected  ? Test Information: Comment   ?POCT rapid strep A  ?Result  Value Ref Range  ? Rapid Strep A Screen Negative Negative  ? ?   ?Assessment & Plan:  ? ?Problem List Items Addressed This Visit   ? ?  ? Musculoskeletal and Integument  ? Generalized rash - Primary  ?  Itchy rash noted over entire trunk and upper leg/thigh. Started 2 weeks ago. Ordered methylprednisolone 4 mg , patient instructed to follow package instructions. Also ordered triamcinolone cream 0.1% to apply to rash twice a day. Return in two weeks or sooner if symptoms worsens. ?  ?  ?  ? Other  ? Tinnitus of left ear  ?  Patient states  symptom started 2 weeks ago along with rash. Started on methylprednisolone 4 mg. Instructed to follow package instructions. Follow up in 2 weeks or sooner if worsens. ?  ?  ?  ? ?Follow up plan: ?Return in about 2 weeks (around 12/26/2021) for Rash and Tinnitus. ? ? ? ? ? ?

## 2021-12-12 NOTE — Assessment & Plan Note (Addendum)
Patient states symptom started 2 weeks ago along with rash. Started on methylprednisolone 4 mg. Instructed to follow package instructions. Follow up in 2 weeks or sooner if worsens. ?

## 2021-12-21 ENCOUNTER — Other Ambulatory Visit: Payer: Self-pay | Admitting: Family Medicine

## 2021-12-22 NOTE — Patient Instructions (Signed)
Rash, Adult  A rash is a change in the color of your skin. A rash can also change the way your skin feels. There are many different conditions and factors that can causea rash. Follow these instructions at home: The goal of treatment is to stop the itching and keep the rash from spreading. Watch for any changes in your symptoms. Let your doctor know about them. Followthese instructions to help with your condition: Medicine Take or apply over-the-counter and prescription medicines only as told by your doctor. These may include medicines: To treat red or swollen skin (corticosteroid creams). To treat itching. To treat an allergy (oral antihistamines). To treat very bad symptoms (oral corticosteroids).  Skin care Put cool cloths (compresses) on the affected areas. Do not scratch or rub your skin. Avoid covering the rash. Make sure that the rash is exposed to air as much as possible. Managing itching and discomfort Avoid hot showers or baths. These can make itching worse. A cold shower may help. Try taking a bath with: Epsom salts. You can get these at your local pharmacy or grocery store. Follow the instructions on the package. Baking soda. Pour a small amount into the bath as told by your doctor. Colloidal oatmeal. You can get this at your local pharmacy or grocery store. Follow the instructions on the package. Try putting baking soda paste onto your skin. Stir water into baking soda until it gets like a paste. Try putting on a lotion that relieves itchiness (calamine lotion). Keep cool and out of the sun. Sweating and being hot can make itching worse. General instructions  Rest as needed. Drink enough fluid to keep your pee (urine) pale yellow. Wear loose-fitting clothing. Avoid scented soaps, detergents, and perfumes. Use gentle soaps, detergents, perfumes, and other cosmetic products. Avoid anything that causes your rash. Keep a journal to help track what causes your rash. Write  down: What you eat. What cosmetic products you use. What you drink. What you wear. This includes jewelry. Keep all follow-up visits as told by your doctor. This is important.  Contact a doctor if: You sweat at night. You lose weight. You pee (urinate) more than normal. You pee less than normal, or you notice that your pee is a darker color than normal. You feel weak. You throw up (vomit). Your skin or the whites of your eyes look yellow (jaundice). Your skin: Tingles. Is numb. Your rash: Does not go away after a few days. Gets worse. You are: More thirsty than normal. More tired than normal. You have: New symptoms. Pain in your belly (abdomen). A fever. Watery poop (diarrhea). Get help right away if: You have a fever and your symptoms suddenly get worse. You start to feel mixed up (confused). You have a very bad headache or a stiff neck. You have very bad joint pains or stiffness. You have jerky movements that you cannot control (seizure). Your rash covers all or most of your body. The rash may or may not be painful. You have blisters that: Are on top of the rash. Grow larger. Grow together. Are painful. Are inside your nose or mouth. You have a rash that: Looks like purple pinprick-sized spots all over your body. Has a "bull's eye" or looks like a target. Is red and painful, causes your skin to peel, and is not from being in the sun too long. Summary A rash is a change in the color of your skin. A rash can also change the way your skin feels.   The goal of treatment is to stop the itching and keep the rash from spreading. Take or apply over-the-counter and prescription medicines only as told by your doctor. Contact a doctor if you have new symptoms or symptoms that get worse. Keep all follow-up visits as told by your doctor. This is important. This information is not intended to replace advice given to you by your health care provider. Make sure you discuss any  questions you have with your healthcare provider. Document Revised: 12/25/2018 Document Reviewed: 04/06/2018 Elsevier Patient Education  2022 Elsevier Inc.  

## 2021-12-23 ENCOUNTER — Other Ambulatory Visit: Payer: Self-pay | Admitting: Nurse Practitioner

## 2021-12-24 NOTE — Telephone Encounter (Signed)
Requested medication (s) are due for refill today: Yes ? ?Requested medication (s) are on the active medication list: Yes ? ?Last refill:  12/01/21 ? ?Future visit scheduled: Yes ? ?Notes to clinic:  Prescribed by [name] NP, UC ? ? ? ?Requested Prescriptions  ?Pending Prescriptions Disp Refills  ? ipratropium (ATROVENT) 0.03 % nasal spray [Pharmacy Med Name: IPRATROPIUM 0.03% SPRAY]    ?  Sig: SPRAY 2 SPRAYS INTO EACH NOSTRIL 3 TIMES A DAY AS NEEDED FOR RHINITIS  ?  ? Off-Protocol Failed - 12/21/2021  2:30 PM  ?  ?  Failed - Medication not assigned to a protocol, review manually.  ?  ?  Passed - Valid encounter within last 12 months  ?  Recent Outpatient Visits   ? ?      ? 1 week ago Generalized rash  ? [place], Medina T, NP  ? 3 months ago Nonischemic cardiomyopathy ([place])  ? [place], Henrine Screws T, NP  ? 9 months ago Coronary artery disease of native artery of native heart with stable angina pectoris ([place])  ? [place], Ol[place] T, NP  ? 1 year ago Coronary artery disease of native artery of native heart with stable angina pectoris ([place])  ? [place], Henrine Screws T, NP  ? 1 year ago Vaginal atrophy  ? [facility] [place], Henrine Screws T, NP  ? ?  ?  ?Future Appointments   ? ?        ? In 2 days Venita Lick, NP [facility]  ? In 2 months Cannady, Barbaraann Faster, NP [facility]  ? In 4 months [place], [place], [place]  ? ?  ? ?  ?  ? Off-Protocol Failed - 12/21/2021  2:30 PM  ?  ?  Failed - Medication not assigned to a protocol, review manually.  ?  ?  Passed - Valid encounter within last 12 months  ?  Recent Outpatient Visits   ? ?      ? 1 week ago Generalized rash  ? [place], [place] T, NP  ? 3 months ago Nonischemic cardiomyopathy ([place])  ? [place], Henrine Screws T, NP  ? 9 months ago Coronary artery disease of native artery of  native heart with stable angina pectoris ([place])  ? [place], [place] T, NP  ? 1 year ago Coronary artery disease of native artery of native heart with stable angina pectoris ([place])  ? [place], Henrine Screws T, NP  ? 1 year ago Vaginal atrophy  ? [facility] [place], Henrine Screws T, NP  ? ?  ?  ?Future Appointments   ? ?        ? In 2 days Venita Lick, NP [facility]  ? In 2 months Cannady, Barbaraann Faster, NP [facility] [facility]  ? In 4 months [place], [place], [place]  ? ?  ? ?  ?  ?  ? ?

## 2021-12-24 NOTE — Telephone Encounter (Signed)
Requested Prescriptions  ?Pending Prescriptions Disp Refills  ?? levothyroxine (SYNTHROID) 75 MCG tablet [Pharmacy Med Name: LEVOTHYROXINE 75 MCG TABLET] 90 tablet 1  ?  Sig: TAKE 1 TABLET BY MOUTH EVERY DAY BEFORE BREAKFAST  ?  ? Endocrinology:  Hypothyroid Agents Passed - 12/23/2021  9:20 AM  ?  ?  Passed - TSH in normal range and within 360 days  ?  TSH  ?Date Value Ref Range Status  ?09/12/2021 4.380 0.450 - 4.500 uIU/mL Final  ?   ?  ?  Passed - Valid encounter within last 12 months  ?  Recent Outpatient Visits   ?      ? 1 week ago Generalized rash  ? [place], [place] T, NP  ? 3 months ago Nonischemic cardiomyopathy ([place])  ? [place], Henrine Screws T, NP  ? 9 months ago Coronary artery disease of native artery of native heart with stable angina pectoris ([place])  ? [place], [place] T, NP  ? 1 year ago Coronary artery disease of native artery of native heart with stable angina pectoris ([place])  ? [place], Henrine Screws T, NP  ? 1 year ago Vaginal atrophy  ? [facility] [place], Henrine Screws T, NP  ?  ?  ?Future Appointments   ?        ? In 2 days Venita Lick, NP [facility] [facility]  ? In 2 months Cannady, Barbaraann Faster, NP [facility] [facility]  ? In 4 months [place], [place], [place]  ?  ? ?  ?  ?  ? ?

## 2021-12-26 ENCOUNTER — Ambulatory Visit: Payer: No Typology Code available for payment source | Admitting: Nurse Practitioner

## 2021-12-26 ENCOUNTER — Encounter: Payer: Self-pay | Admitting: Nurse Practitioner

## 2021-12-26 ENCOUNTER — Other Ambulatory Visit: Payer: Self-pay | Admitting: Nurse Practitioner

## 2021-12-26 VITALS — BP 103/64 | HR 77 | Temp 98.1°F | Ht 66.0 in | Wt 145.4 lb

## 2021-12-26 DIAGNOSIS — R21 Rash and other nonspecific skin eruption: Secondary | ICD-10-CM

## 2021-12-26 DIAGNOSIS — H9312 Tinnitus, left ear: Secondary | ICD-10-CM | POA: Diagnosis not present

## 2021-12-26 NOTE — Assessment & Plan Note (Signed)
Acute and improving post steroids and cream.  At this time will send in refills on Triamcinolone and recommend she monitor.  If worsening return to office.  Suspect allergic reaction to Azithromycin, will add this to her allergy list. ?

## 2021-12-26 NOTE — Progress Notes (Signed)
? ?BP 103/64   Pulse 77   Temp 98.1 ?F (36.7 ?C) (Oral)   Ht '5\' 6"'$  (1.676 m)   Wt 145 lb 6.4 oz (66 kg)   LMP 11/16/2007 (Approximate)   SpO2 98%   BMI 23.47 kg/m?   ? ?Subjective:  ? ? Patient ID: April Raymond, female    DOB: 1961-01-25, 61 y.o.   MRN: [id number] ? ?HPI: ?April Raymond is a 61 y.o. female ? ?Chief Complaint  ?Patient presents with  ? Rash  ?  Patient is here to follow up on Rash. Patient states the rash is almost gone.   ? Tinnitus  ?  Patient is here to follow up on Tinnitus. Patient states she still having a little bit of static in her ears, but has gotten a little quieter since her last visit.   ? ?TINNITUS ?Is improving, not 100% -- constant, a lot lower in tone and not as loud.  Distortion is clearing up.  Is going to ENT the end of this month. ?Duration: weeks  ?Description of tinnitus: not as loud  ?Pulsatile: no ?Tinnitus duration: continuous ?Episode frequency: continous ?Severity: moderate ?Aggravating factors: nothing ?Alleviating factors: nothing ?Head injury: no ?Chronic exposure to loud noises: no ?Exposure to ototoxic medications: no  ?Vertigo: none ?Hearing loss: yes baseline ?Aural fullness: no ?Headache: no ?TMJ syndrome symptoms: nono ?Unsteady gait:  ?Postural instability: no ?Diplopia, dysarthria, dysphagia or weakness: no ?Anxietydepression:  none ? ?RASH ?Follow-up for rash, was treated with steroids and triamcinolone cream.  At this time rash is improving. ?Duration:  weeks  ?Location: generalized, trunk, and arms (from shoulders to upper thigh) ?Itching: no ?Burning: no ?Redness: no ?Oozing: no ?Scaling: no ?Blisters: no ?Painful: no ?Fevers: no ?Change in detergents/soaps/personal care products: no ?Recent illness: yes ?Recent travel:no ?History of same: no ?Context: stable ?Alleviating factors: hydrocortisone cream and benadryl, steroid, triamcinolone ?Treatments attempted:hydrocortisone cream and benadryl ?Shortness of breath: no  ?Throat/tongue swelling:  no ?Myalgias/arthralgias: no  ? ?Relevant past medical, surgical, family and social history reviewed and updated as indicated. Interim medical history since our last visit reviewed. ?Allergies and medications reviewed and updated. ? ?Review of Systems  ?Constitutional:  Negative for activity change, chills, fatigue and fever.  ?HENT:  Positive for tinnitus. Negative for ear discharge, ear pain and rhinorrhea.   ?Eyes:  Negative for photophobia and visual disturbance.  ?Respiratory:  Negative for shortness of breath.   ?Musculoskeletal:  Negative for arthralgias and myalgias.  ?Skin:  Positive for rash.  ?Neurological:  Negative for light-headedness and headaches.  ? ?Per HPI unless specifically indicated above ? ?   ?Objective:  ?  ?BP 103/64   Pulse 77   Temp 98.1 ?F (36.7 ?C) (Oral)   Ht '5\' 6"'$  (1.676 m)   Wt 145 lb 6.4 oz (66 kg)   LMP 11/16/2007 (Approximate)   SpO2 98%   BMI 23.47 kg/m?   ?Wt Readings from Last 3 Encounters:  ?12/26/21 145 lb 6.4 oz (66 kg)  ?12/12/21 145 lb 3.2 oz (65.9 kg)  ?11/07/21 142 lb (64.4 kg)  ?  ?Physical Exam ?Constitutional:   ?   General: She is not in acute distress. ?   Appearance: Normal appearance. She is well-groomed and normal weight. She is not ill-appearing.  ?HENT:  ?   Head: Normocephalic and atraumatic.  ?   Jaw: No tenderness or swelling.  ?   Salivary Glands: Right salivary gland is not diffusely enlarged or tender. Left salivary gland  is not diffusely enlarged or tender.  ?   Right Ear: Tympanic membrane, ear canal and external ear normal.  ?   Left Ear: Ear canal and external ear normal.  ?   Nose: Nose normal. No congestion or rhinorrhea.  ?   Mouth/Throat:  ?   Mouth: Mucous membranes are moist.  ?   Pharynx: Oropharynx is clear.  ?Eyes:  ?   General: Lids are normal. No allergic shiner.    ?   Right eye: No discharge.     ?   Left eye: No discharge.  ?   Conjunctiva/sclera: Conjunctivae normal.  ?Neck:  ?   Thyroid: No thyromegaly or thyroid tenderness.   ?Cardiovascular:  ?   Rate and Rhythm: Normal rate and regular rhythm.  ?   Pulses: Normal pulses.  ?   Heart sounds: Normal heart sounds. No murmur heard. ?  No gallop.  ?Pulmonary:  ?   Effort: Pulmonary effort is normal. No accessory muscle usage or respiratory distress.  ?   Breath sounds: Normal breath sounds. No wheezing or rhonchi.  ?Musculoskeletal:  ?   Cervical back: Normal range of motion.  ?Lymphadenopathy:  ?   Head:  ?   Right side of head: No submental, submandibular, tonsillar, preauricular, posterior auricular or occipital adenopathy.  ?   Left side of head: No submental, submandibular, tonsillar, preauricular, posterior auricular or occipital adenopathy.  ?   Cervical: No cervical adenopathy.  ?   Right cervical: No superficial, deep or posterior cervical adenopathy. ?   Left cervical: No superficial, deep or posterior cervical adenopathy.  ?   Upper Body:  ?   Right upper body: No supraclavicular adenopathy.  ?   Left upper body: No supraclavicular adenopathy.  ?Skin: ?   General: Skin is warm and dry.  ?   Findings: Rash present. Rash is urticarial. Rash is not papular.  ?   Comments: Generalized rash on trunk and upper leg/thigh, fading, less patches noted.  ?Neurological:  ?   Mental Status: She is alert and oriented to person, place, and time.  ?Psychiatric:     ?   Attention and Perception: Attention normal.     ?   Mood and Affect: Mood and affect normal.     ?   Speech: Speech normal.     ?   Behavior: Behavior normal. Behavior is cooperative.     ?   Thought Content: Thought content normal.     ?   Judgment: Judgment normal.  ? ? ?Results for orders placed or performed during the hospital encounter of 11/23/21  ?COVID-19, Flu A+B and RSV ([facility])  ?Result Value Ref Range  ? SARS-CoV-2, NAA Not Detected Not Detected  ? Influenza A, NAA Not Detected Not Detected  ? Influenza B, NAA Not Detected Not Detected  ? RSV, NAA Not Detected Not Detected  ? Test Information: Comment   ?POCT rapid  strep A  ?Result Value Ref Range  ? Rapid Strep A Screen Negative Negative  ? ?   ?Assessment & Plan:  ? ?Problem List Items Addressed This Visit   ? ?  ? Musculoskeletal and Integument  ? Generalized rash - Primary  ?  Acute and improving post steroids and cream.  At this time will send in refills on Triamcinolone and recommend she monitor.  If worsening return to office.  Suspect allergic reaction to Azithromycin, will add this to her allergy list. ?  ?  ?  ?  Other  ? Tinnitus of left ear  ?  Acute and ongoing, although improving post steroids.  She is scheduled to see ENT upcoming, appreciate their input.  Suspect more related to recent URI. ?  ?  ?  ? ?Follow up plan: ?Return if symptoms worsen or fail to improve. ? ? ? ? ? ?

## 2021-12-26 NOTE — Assessment & Plan Note (Signed)
Acute and ongoing, although improving post steroids.  She is scheduled to see ENT upcoming, appreciate their input.  Suspect more related to recent URI. ?

## 2021-12-27 NOTE — Telephone Encounter (Signed)
Requested medication (s) are due for refill today: Yes ? ?Requested medication (s) are on the active medication list: Yes ? ?Last refill:  12/12/21 ? ?Future visit scheduled: Yes ? ?Notes to clinic:  See request. ? ? ? ?Requested Prescriptions  ?Pending Prescriptions Disp Refills  ? triamcinolone cream (KENALOG) 0.1 % [Pharmacy Med Name: TRIAMCINOLONE 0.1% CREAM] 30 g 0  ?  Sig: APPLY TO AFFECTED AREA TWICE A DAY  ?  ? Not Delegated - Dermatology:  Corticosteroids Failed - 12/26/2021  1:12 PM  ?  ?  Failed - This refill cannot be delegated  ?  ?  Passed - Valid encounter within last 12 months  ?  Recent Outpatient Visits   ? ?      ? Yesterday Generalized rash  ? [facility] [place], [place] T, NP  ? 2 weeks ago Generalized rash  ? [place], [place] T, NP  ? 3 months ago Nonischemic cardiomyopathy ([place])  ? [place], Henrine Screws T, NP  ? 9 months ago Coronary artery disease of native artery of native heart with stable angina pectoris ([place])  ? [place], [place] T, NP  ? 1 year ago Coronary artery disease of native artery of native heart with stable angina pectoris ([place])  ? [facility] [place], Henrine Screws T, NP  ? ?  ?  ?Future Appointments   ? ?        ? In 2 months Cannady, Barbaraann Faster, NP [facility] [facility]  ? In 3 months [place], [place], [place]  ? ?  ? ?  ?  ?  ? ?

## 2021-12-31 ENCOUNTER — Ambulatory Visit: Payer: No Typology Code available for payment source | Admitting: Pain Medicine

## 2022-01-06 NOTE — Progress Notes (Addendum)
PROVIDER NOTE: Information contained herein reflects review and annotations entered in association with encounter. Interpretation of such information and data should be left to medically-trained personnel. Information provided to patient can be located elsewhere in the medical record under "Patient Instructions". Document created using STT-dictation technology, any transcriptional errors that may result from process are unintentional.  ?  ?Patient: April Raymond  Service Category: E/M  Provider: Gaspar Cola, MD  ?DOB: 04-Jan-1961  DOS: 01/09/2022  Specialty: Interventional Pain Management  ?MRN: [id number]  Setting: Ambulatory outpatient  PCP: Venita Lick, NP  ?Type: Established Patient    Referring Provider: Venita Lick, NP  ?Location: Office  Delivery: Face-to-face    ? ?Primary Reason(s) for Visit: Encounter for evaluation before starting new chronic pain management plan of care (Level of risk: moderate) ?CC: Pain (Between shoulder blades, mostly on left side) ? ?HPI  ?April Raymond is a 61 y.o. year old, female patient, who comes today for a follow-up evaluation to review the test results and decide on a treatment plan. She has Former smoker; History of MI (myocardial infarction); History of parathyroidectomy ([place]); Coronary artery disease of native artery of native heart with stable angina pectoris ([place]); Essential hypertension; Hyperlipidemia LDL goal <70; Hashimoto's disease; Vaginal atrophy; Hidradenitis suppurativa; Nonischemic cardiomyopathy ([facility]); PAD (peripheral artery disease) ([place]); Moderate COPD (chronic obstructive pulmonary disease) ([place]); Alpha-1-antitrypsin deficiency carrier; H/O lipoma; CKD (chronic kidney disease) stage 3, GFR 30-59 ml/min (HCC); Chronic upper back pain (1ry area of Pain) (Right); Chronic thoracic back pain (Bilateral) (R>L); Chronic neck pain (2ry area of Pain) (Bilateral) (R>L); Chronic shoulder pain (3ry area of Pain) (Bilateral); Painful cervical range of  motion; Impaired range of motion of cervical spine; Spasm of thoracic back muscle; Chronic pain syndrome; Cervicalgia; DDD (degenerative disc disease), cervical; Grade 1 Anterolisthesis of cervical spine (C7/T1); Grade 1 Anterolisthesis of thoracic spine (T2/T3); Intercostal neuralgia (Right); Generalized rash; Tinnitus of left ear; DDD (degenerative disc disease), thoracic; Thoracic facet syndrome; and Cervical facet syndrome on their problem list. Her primarily concern today is the Pain (Between shoulder blades, mostly on left side) ? ?Pain Assessment: ?Location: Right, Left Other (Comment) (between shoulder blades, left is worse) ?Radiating: left shoulder and upper arm ?Onset: More than a month ago ?Duration: Chronic pain ?Quality: Constant, Aching, Pressure ?Severity: 3 /10 (subjective, self-reported pain score)  ?Effect on ADL:   ?Timing: Constant ?Modifying factors: heat ?BP: (!) 126/58  HR: 82 ? ?April Raymond comes in today for a follow-up visit after her initial evaluation on 11/19/2021. Today we went over the results of her tests. These were explained in "Layman's terms". During today's appointment we went over my diagnostic impression, as well as the proposed treatment plan. ? ?Review of initial evaluation (10/22/2021): "According to the patient, she had a lipoma removed from the posterior right thoracic wall area on 05/09/2021 (5.5 months ago) after which she started experiencing muscle spasms in her back associated with posterior neck pain and bilateral shoulder pain.  The patient refers that she has been doing some physical therapy, but she is not very happy with the results or the exercises that they have instructed her to do.  She refers that they do not seem to be addressing the problem. ?  ?According to the patient the primary area of pain seems to be that of the upper back (Bilateral) (R>L).  The scar from the removal of the lipoma seems to be well-healed, with no evidence of redness, swelling,  tenderness, or discharge.  The scar seems to freely move, but she does seem to have some degree of atrophy of the muscle in the surgical area.  MRI images of the lipoma did show the lipoma to be inside of the intercostal area with some of the lipoma actually infiltrated and reaching the thoracic cage. ?  ?The patient's secondary area pain seems to be that of the neck (posterior) (Bilateral) (R>L).  She denies any cervical spine surgery or any recent x-rays of the area.  She does present today with decreased range of motion and some painful range of motion of the cervical spine. ?  ?The patient's third area pain is that of the shoulders (Bilateral) (R>L).  This seems to be affecting the posterior musculature including the trapezius muscle and the suprascapular muscles.  Range of motion of the shoulder seems to be adequate.  She denies any pain numbness or weakness going down the arm except for occasional shooting pain into the upper third of the arms, bilaterally.  This appears to happen with certain types of movements but its not constant." ? ?Today I went over the results of the patient's lab work and x-rays and I have explained those to the patient in layman's terms.  The patient indicates that recently she had a Medrol Dosepak which completely eliminated her neck pain and upper back pain for a short period time.  However, it has come back, this time more so on the left side.  Based on the above information and the results of her studies, I believe that the patient would benefit from a diagnostic bilateral thoracic facet block (C7-T1 & T2-3).  This is the region where the imaging studies have confirmed the patient to have a grade 1 anterolisthesis.  This anterolisthesis is likely to be causing facet arthropathy which in turn is likely to be responsible for the patient's upper back pain.  Specifically this pain is in the very base of the cervical spine, bilaterally, currently with the left side being worse than the  right.  The patient also has painful range of motion associated with facet disease.  She did patient get adequate relief of the pain with the diagnostic bilateral thoracocervical facet blocks, we will consider radiofrequency ablation as a long-term alternative.  The above information and the plan were shared with the patient who understood and accepted. ? ?In considering the treatment plan options, Ms. Temkin was reminded that I no longer take patients for medication management only. I asked her to let me know if she had no intention of taking advantage of the interventional therapies, so that we could make arrangements to provide this space to someone interested. I also made it clear that undergoing interventional therapies for the purpose of getting pain medications is very inappropriate on the part of a patient, and it will not be tolerated in this practice. This type of behavior would suggest true addiction and therefore it requires referral to an addiction specialist.  ? ?Further details on both, my assessment(s), as well as the proposed treatment plan, please see below. ? ?Controlled Substance Pharmacotherapy Assessment ?REMS (Risk Evaluation and Mitigation Strategy)  ?Opioid Analgesic: None ?MME/day: 0 mg/day  ?Pill Count: None expected due to no prior prescriptions written by our practice. ?Hart Rochester, RN  01/09/2022  8:53 AM  Signed ?Safety precautions to be maintained throughout the outpatient stay will include: orient to surroundings, keep bed in low position, maintain call bell within reach at all times, provide assistance with transfer out of bed  and ambulation.  ?  ?Pharmacokinetics: ?Liberation and absorption (onset of action): WNL ?Distribution (time to peak effect): WNL ?Metabolism and excretion (duration of action): WNL         ?Pharmacodynamics: ?Desired effects: ?Analgesia: Ms. Heitman reports >50% benefit. ?Functional ability: Patient reports that medication allows her to accomplish basic  ADLs ?Clinically meaningful improvement in function (CMIF): Sustained CMIF goals met ?Perceived effectiveness: Described as relatively effective, allowing for increase in activities of daily living (ADL) ?Undesirable e

## 2022-01-09 ENCOUNTER — Other Ambulatory Visit: Payer: Self-pay

## 2022-01-09 ENCOUNTER — Telehealth: Payer: Self-pay

## 2022-01-09 ENCOUNTER — Encounter: Payer: Self-pay | Admitting: Pain Medicine

## 2022-01-09 ENCOUNTER — Ambulatory Visit: Payer: BLUE CROSS/BLUE SHIELD | Attending: Pain Medicine | Admitting: Pain Medicine

## 2022-01-09 VITALS — BP 126/58 | HR 82 | Temp 97.2°F | Resp 18 | Ht 66.0 in | Wt 145.0 lb

## 2022-01-09 DIAGNOSIS — M4314 Spondylolisthesis, thoracic region: Secondary | ICD-10-CM | POA: Insufficient documentation

## 2022-01-09 DIAGNOSIS — M25512 Pain in left shoulder: Secondary | ICD-10-CM | POA: Insufficient documentation

## 2022-01-09 DIAGNOSIS — M62830 Muscle spasm of back: Secondary | ICD-10-CM | POA: Insufficient documentation

## 2022-01-09 DIAGNOSIS — M5134 Other intervertebral disc degeneration, thoracic region: Secondary | ICD-10-CM | POA: Insufficient documentation

## 2022-01-09 DIAGNOSIS — M549 Dorsalgia, unspecified: Secondary | ICD-10-CM | POA: Diagnosis not present

## 2022-01-09 DIAGNOSIS — M4312 Spondylolisthesis, cervical region: Secondary | ICD-10-CM | POA: Diagnosis present

## 2022-01-09 DIAGNOSIS — G8929 Other chronic pain: Secondary | ICD-10-CM | POA: Insufficient documentation

## 2022-01-09 DIAGNOSIS — M25511 Pain in right shoulder: Secondary | ICD-10-CM | POA: Diagnosis present

## 2022-01-09 DIAGNOSIS — G894 Chronic pain syndrome: Secondary | ICD-10-CM | POA: Diagnosis present

## 2022-01-09 DIAGNOSIS — M542 Cervicalgia: Secondary | ICD-10-CM | POA: Insufficient documentation

## 2022-01-09 DIAGNOSIS — M546 Pain in thoracic spine: Secondary | ICD-10-CM | POA: Diagnosis not present

## 2022-01-09 DIAGNOSIS — M47812 Spondylosis without myelopathy or radiculopathy, cervical region: Secondary | ICD-10-CM | POA: Insufficient documentation

## 2022-01-09 DIAGNOSIS — M47894 Other spondylosis, thoracic region: Secondary | ICD-10-CM | POA: Insufficient documentation

## 2022-01-09 NOTE — Patient Instructions (Addendum)
______________________________________________________________________ ? ?Preparing for Procedure with Sedation ? ?NOTICE: Due to recent regulatory changes, starting on April 16, 2021, procedures requiring intravenous (IV) sedation will no longer be performed at the [facility].  These types of procedures are required to be performed at [facility] ambulatory surgery facility.  We are very sorry for the inconvenience. ? ?Procedure appointments are limited to planned procedures: ?No Prescription Refills. ?No disability issues will be discussed. ?No medication changes will be discussed. ? ?Instructions: ?Oral Intake: Do not eat or drink anything for at least 8 hours prior to your procedure. (Exception: Blood Pressure Medication. See below.) ?Transportation: A driver is required. You may not drive yourself after the procedure. ?Blood Pressure Medicine: Do not forget to take your blood pressure medicine with a sip of water the morning of the procedure. If your Diastolic (lower reading) is above 100 mmHg, elective cases will be cancelled/rescheduled. ?Blood thinners: These will need to be stopped for procedures. Notify our staff if you are taking any blood thinners. Depending on which one you take, there will be specific instructions on how and when to stop it. ?Diabetics on insulin: Notify the staff so that you can be scheduled 1st case in the morning. If your diabetes requires high dose insulin, take only ? of your normal insulin dose the morning of the procedure and notify the staff that you have done so. ?Preventing infections: Shower with an antibacterial soap the morning of your procedure. ?Build-up your immune system: Take 1000 mg of Vitamin C with every meal (3 times a day) the day prior to your procedure. ?Antibiotics: Inform the staff if you have a condition or reason that requires you to take antibiotics before dental procedures. ?Pregnancy: If you are pregnant, call and cancel the procedure. ?Sickness: If  you have a cold, fever, or any active infections, call and cancel the procedure. ?Arrival: You must be in the facility at least 30 minutes prior to your scheduled procedure. ?Children: Do not bring children with you. ?Dress appropriately: Bring dark clothing that you would not mind if they get stained. ?Valuables: Do not bring any jewelry or valuables. ? ?Reasons to call and reschedule or cancel your procedure: (Following these recommendations will minimize the risk of a serious complication.) ?Surgeries: Avoid having procedures within 2 weeks of any surgery. (Avoid for 2 weeks before or after any surgery). ?Flu Shots: Avoid having procedures within 2 weeks of a flu shots. (Avoid for 2 weeks before or after immunizations). ?Barium: Avoid having a procedure within 7-10 days after having had a radiological study involving the use of radiological contrast. (Myelograms, Barium swallow or enema study). ?Heart attacks: Avoid any elective procedures or surgeries for the initial 6 months after a "Myocardial Infarction" (Heart Attack). ?Blood thinners: It is imperative that you stop these medications before procedures. Let us know if you if you take any blood thinner.  ?Infection: Avoid procedures during or within two weeks of an infection (including chest colds or gastrointestinal problems). Symptoms associated with infections include: Localized redness, fever, chills, night sweats or profuse sweating, burning sensation when voiding, cough, congestion, stuffiness, runny nose, sore throat, diarrhea, nausea, vomiting, cold or Flu symptoms, recent or current infections. It is specially important if the infection is over the area that we intend to treat. ?Heart and lung problems: Symptoms that may suggest an active cardiopulmonary problem include: cough, chest pain, breathing difficulties or shortness of breath, dizziness, ankle swelling, uncontrolled high or unusually low blood pressure, and/or palpitations. If you are    experiencing any of these symptoms, cancel your procedure and contact your primary care physician for an evaluation. ? ?Remember:  ?Regular Business hours are:  ?Monday to Thursday 8:00 AM to 4:00 PM ? ?Provider's Schedule: ?Francisco Naveira, MD:  ?Procedure days: Tuesday and Thursday 7:30 AM to 4:00 PM ? ?Bilal Lateef, MD:  ?Procedure days: Monday and Wednesday 7:30 AM to 4:00 PM ?______________________________________________________________________ ? ____________________________________________________________________________________________ ? ?General Risks and Possible Complications ? ?Patient Responsibilities: It is important that you read this as it is part of your informed consent. It is our duty to inform you of the risks and possible complications associated with treatments offered to you. It is your responsibility as a patient to read this and to ask questions about anything that is not clear or that you believe was not covered in this document. ? ?Patient?s Rights: You have the right to refuse treatment. You also have the right to change your mind, even after initially having agreed to have the treatment done. However, under this last option, if you wait until the last second to change your mind, you may be charged for the materials used up to that point. ? ?Introduction: Medicine is not an exact science. Everything in Medicine, including the lack of treatment(s), carries the potential for danger, harm, or loss (which is by definition: Risk). In Medicine, a complication is a secondary problem, condition, or disease that can aggravate an already existing one. All treatments carry the risk of possible complications. The fact that a side effects or complications occurs, does not imply that the treatment was conducted incorrectly. It must be clearly understood that these can happen even when everything is done following the highest safety standards. ? ?No treatment: You can choose not to proceed with the  proposed treatment alternative. The ?PRO(s)? would include: avoiding the risk of complications associated with the therapy. The ?CON(s)? would include: not getting any of the treatment benefits. These benefits fall under one of three categories: diagnostic; therapeutic; and/or palliative. Diagnostic benefits include: getting information which can ultimately lead to improvement of the disease or symptom(s). Therapeutic benefits are those associated with the successful treatment of the disease. Finally, palliative benefits are those related to the decrease of the primary symptoms, without necessarily curing the condition (example: decreasing the pain from a flare-up of a chronic condition, such as incurable terminal cancer). ? ?General Risks and Complications: These are associated to most interventional treatments. They can occur alone, or in combination. They fall under one of the following six (6) categories: no benefit or worsening of symptoms; bleeding; infection; nerve damage; allergic reactions; and/or death. ?No benefits or worsening of symptoms: In Medicine there are no guarantees, only probabilities. No healthcare provider can ever guarantee that a medical treatment will work, they can only state the probability that it may. Furthermore, there is always the possibility that the condition may worsen, either directly, or indirectly, as a consequence of the treatment. ?Bleeding: This is more common if the patient is taking a blood thinner, either prescription or over the counter (example: Goody Powders, Fish oil, Aspirin, Garlic, etc.), or if suffering a condition associated with impaired coagulation (example: Hemophilia, cirrhosis of the liver, low platelet counts, etc.). However, even if you do not have one on these, it can still happen. If you have any of these conditions, or take one of these drugs, make sure to notify your treating physician. ?Infection: This is more common in patients with a compromised  immune system, either due to disease (example:   diabetes, cancer, human immunodeficiency virus [HIV], etc.), or due to medications or treatments (example: therapies used to treat cancer and rheumatological diseas

## 2022-01-09 NOTE — Telephone Encounter (Signed)
I have sent a message to the nurse from requesting office to fax over official clearance request.  ?

## 2022-01-09 NOTE — Progress Notes (Signed)
Safety precautions to be maintained throughout the outpatient stay will include: orient to surroundings, keep bed in low position, maintain call bell within reach at all times, provide assistance with transfer out of bed and ambulation.  

## 2022-01-10 NOTE — Telephone Encounter (Signed)
? ?  Pre-operative Risk Assessment  ?  ?Patient Name: April Raymond  ?DOB: [birth year] ?MRN: [id number]  ? ?  ? ?Request for Surgical Clearance   ? ?Procedure:   THORACIC FACET BLOCK ? ?Date of Surgery:  Clearance TBD                              ?   ?Surgeon:  DR. Dossie Arbour ?Surgeon's Group or Practice Name:  [facility] ?Phone number:  [phone] ?Fax number:  [phone] ?  ?Type of Clearance Requested:   ?- Medical  ?- Pharmacy:  Hold Ticagrelor (Brilinta) x 5 DAYS PRIOR ?  ?Type of Anesthesia:   IV VERSED ?  ?Additional requests/questions:   ? ?Signed, ?Julaine Hua   ?01/10/2022, 1:49 PM  ? ?

## 2022-01-10 NOTE — Telephone Encounter (Signed)
I have sent another message today RN that we will need a clearance request.  ?

## 2022-01-10 NOTE — Telephone Encounter (Signed)
Dr. Saunders Revel, request to hold Brilinta x 5 days prior to thoracic facet block. Patient with history of CAD status post remote PCI to RCA along with stress-induced cardiomyopathy.  Last cath in 2021 showed patent RCA stents with nonobstructive CAD involving the LAD and LCx. Please route reply to the preop pool.  ?

## 2022-01-11 NOTE — Telephone Encounter (Signed)
I recommend holding ticagrelor for 5 days prior to the procedure and restarting when it is felt safe to do so from a periprocedural standpoint.  The patient should take aspirin 81 mg daily while she is off ticagrelor. ? ?Nelva Bush, MD ?[facility] ?

## 2022-01-11 NOTE — Telephone Encounter (Signed)
? ? ?  Name: April Raymond  ?DOB: 01/14/1961  ?MRN: [id number] ? ?Primary Cardiologist: Nelva Bush, MD ? ? ?Preoperative team, please contact this patient and set up a phone call appointment for further preoperative risk assessment. Please obtain consent and complete medication review. Thank you for your help. ? ?I confirm that guidance regarding antiplatelet and oral anticoagulation therapy has been completed and, if necessary, noted below. ? ? ? ?Christell Faith, PA-C ?01/11/2022, 12:48 PM ?[facility] ?[address] ?[place], [place] [address] ? ? ?

## 2022-01-11 NOTE — Telephone Encounter (Signed)
Spoke with patient and she stated that she has decided to cancel the planned injection. She was going to notify pain management today but they were already closed. She is going to call them on Monday to cancel. Will fax note to [facility] pain mgmt to make them aware. ?

## 2022-01-14 ENCOUNTER — Telehealth: Payer: Self-pay | Admitting: *Deleted

## 2022-01-14 ENCOUNTER — Telehealth: Payer: Self-pay | Admitting: Pain Medicine

## 2022-01-14 NOTE — Telephone Encounter (Signed)
I s/w the pt today and she has decided to go through with her injection. She states she did not tell the CMA Friday that she was cancelling her injection. She states that she told her she wanted to take the weekend to think about it. Pt is planning on still proceeding with the injection. Pt agreeable to plan of care for tele pre op appt 01/15/22 @ 4 pm. Med rec and consent are done. ?I will send FYI to requesting office.  ? ?  ?Patient Consent for Virtual Visit  ? ? ?   ? ?DANIELLE MINK has provided verbal consent on 01/14/2022 for a virtual visit (video or telephone). ? ? ?CONSENT FOR VIRTUAL VISIT FOR:  April Raymond  ?By participating in this virtual visit I agree to the following: ? ?I hereby voluntarily request, consent and authorize [facility] and its employed or contracted physicians, physician assistants, nurse practitioners or other licensed health care professionals (the Practitioner), to provide me with telemedicine health care services (the ?Services") as deemed necessary by the treating Practitioner. I acknowledge and consent to receive the Services by the Practitioner via telemedicine. I understand that the telemedicine visit will involve communicating with the Practitioner through live audiovisual communication technology and the disclosure of certain medical information by electronic transmission. I acknowledge that I have been given the opportunity to request an in-person assessment or other available alternative prior to the telemedicine visit and am voluntarily participating in the telemedicine visit. ? ?I understand that I have the right to withhold or withdraw my consent to the use of telemedicine in the course of my care at any time, without affecting my right to future care or treatment, and that the Practitioner or I may terminate the telemedicine visit at any time. I understand that I have the right to inspect all information obtained and/or recorded in the course of the telemedicine visit  and may receive copies of available information for a reasonable fee.  I understand that some of the potential risks of receiving the Services via telemedicine include:  ?Delay or interruption in medical evaluation due to technological equipment failure or disruption; ?Information transmitted may not be sufficient (e.g. poor resolution of images) to allow for appropriate medical decision making by the Practitioner; and/or  ?In rare instances, security protocols could fail, causing a breach of personal health information. ? ?Furthermore, I acknowledge that it is my responsibility to provide information about my medical history, conditions and care that is complete and accurate to the best of my ability. I acknowledge that Practitioner's advice, recommendations, and/or decision may be based on factors not within their control, such as incomplete or inaccurate data provided by me or distortions of diagnostic images or specimens that may result from electronic transmissions. I understand that the practice of medicine is not an exact science and that Practitioner makes no warranties or guarantees regarding treatment outcomes. I acknowledge that a copy of this consent can be made available to me via my patient portal ([facility]), or I can request a printed copy by calling the office of [facility].   ? ?I understand that my insurance will be billed for this visit.  ? ?I have read or had this consent read to me. ?I understand the contents of this consent, which adequately explains the benefits and risks of the Services being provided via telemedicine.  ?I have been provided ample opportunity to ask questions regarding this consent and the Services and have had my questions  answered to my satisfaction. ?I give my informed consent for the services to be provided through the use of telemedicine in my medical care ? ? ? ? ? ?

## 2022-01-14 NOTE — Telephone Encounter (Signed)
Questions have been answered.   ?

## 2022-01-14 NOTE — Telephone Encounter (Signed)
Patient is calling to speak with pre-op team regarding appt and procedure ?

## 2022-01-14 NOTE — Telephone Encounter (Signed)
I s/w the pt today and she has decided to go through with her injection. She states she did not tell the CMA Friday that she was cancelling her injection. She states that she told her she wanted to take the weekend to think about it. Pt is planning on still proceeding with the injection. Pt agreeable to plan of care for tele pre op appt 01/15/22 @ 4 pm. Med rec and consent are done. ?I will send FYI to requesting office.  ?

## 2022-01-15 ENCOUNTER — Ambulatory Visit (INDEPENDENT_AMBULATORY_CARE_PROVIDER_SITE_OTHER): Payer: No Typology Code available for payment source | Admitting: General Practice

## 2022-01-15 DIAGNOSIS — Z01810 Encounter for preprocedural cardiovascular examination: Secondary | ICD-10-CM

## 2022-01-15 NOTE — Progress Notes (Signed)
? ?Virtual Visit via Telephone Note  ? ?This visit type was conducted due to national recommendations for restrictions regarding the COVID-19 Pandemic (e.g. social distancing) in an effort to limit this patient's exposure and mitigate transmission in our community.  Due to her co-morbid illnesses, this patient is at least at moderate risk for complications without adequate follow up.  This format is felt to be most appropriate for this patient at this time.  The patient did not have access to video technology/had technical difficulties with video requiring transitioning to audio format only (telephone).  All issues noted in this document were discussed and addressed.  No physical exam could be performed with this format.  Please refer to the patient's chart for her  consent to telehealth for [facility]. ? ?Evaluation Performed:  Preoperative cardiovascular risk assessment ?_____________  ? ?Date:  01/15/2022  ? ?Patient ID:  April Raymond, DOB Feb 16, 1961, MRN [id number] ?Patient Location:  ?Home ?Provider location:   ?Office ? ?Primary Care Provider:  Venita Lick, NP ?Primary Cardiologist:  Nelva Bush, MD ? ?Chief Complaint  ?  ?61 y.o. y/o female with a h/o coronary artery disease, moderate COPD, nonischemic cardiomyopathy, peripheral arterial disease, hypertension, who is pending thoracic facet block, and presents today for telephonic preoperative cardiovascular risk assessment. ? ?Past Medical History  ?  ?Past Medical History:  ?Diagnosis Date  ? Alpha-1-antitrypsin deficiency carrier 02/20/2021  ? MZ phenotype  ? Anginal pain ([place])   ? CAD (coronary artery disease)   ? a. 2017 s/p prior MI & PCI RCA ([place]); b. 05/2020 Abnl ETT: 1.5-68m horizontal ST dep in inflat leads @ peak stress w/ freq PVCs/couplets/bigeminy. HTN response; b. 05/2020 NSTEMI/Cath: LM nl, LAD 413mLCX 20, RCA 35ost, patent prev placed prox-dist stent. EF 25-35% w/ apical ballooning.  ? COPD (chronic obstructive pulmonary  disease) [place]  ? Hashimoto's disease   ? Hidradenitis suppurativa   ? Hydradenitis   ? Hyperparathyroidism ([facility]  ? Hypertension   ? [facility]  ? Pericardial effusion   ? a. 05/2020 Echo: sm to mod circumferential peric eff w/o tamponade; b. 07/2020 Mod peric effusion; c. 09/2020 Echo: EF 60-65%, no rwma, small tomod effusion w/o compromise.  ? Takotsubo cardiomyopathy   ? a. 05/2020 Echo: EF 35-40%, glob HK, though basal wall motion best preserved consistent w/ stress induced CM. Gr1 DD. Nl RV size/fxn. Sm to mod circumferential pericardial effusion w/o tamponade.  ? ?Past Surgical History:  ?Procedure Laterality Date  ? BREAST BIOPSY Right 05/04/2021  ? stereo x clip BENIGN BREAST TISSUE WITH MILD STROMAL FIBROSIS AND HEMORRHAGE  ? BREAST CYST ASPIRATION Right 05/01/2021  ? BREAST EXCISIONAL BIOPSY Left   ? age 89's? CARDIAC CATHETERIZATION    ? CHEST WALL RECONSTRUCTION Right 05/09/2021  ? Procedure: CHEST WALL MASS RESECTION;  Surgeon: LiLajuana MatteMD;  [facility] Service: Thoracic;  Laterality: Right;  posterior  ? CYST EXCISION  x2  ? LEFT HEART CATH AND CORONARY ANGIOGRAPHY N/A 05/31/2020  ? Procedure: LEFT HEART CATH AND CORONARY ANGIOGRAPHY;  Surgeon: EnNelva BushMD;  [facility];  Service: Cardiovascular;  Laterality: N/A;  ? LIPOMA EXCISION    ? PARATHYROIDECTOMY    ? TONSILLECTOMY    ? ? ?Allergies ? ?Allergies  ?Allergen Reactions  ? Azithromycin Rash  ? Humira [Adalimumab] Rash  ? Tape Rash  ? ? ?History of Present Illness  ?  ?[name]  is a 62 y.o. female who presents via audio/video conferencing for a telehealth visit today.  Pt was last seen in cardiology clinic on  11/07/21 by Dr. Saunders Revel.  At that time April Raymond was doing well .  The patient is now pending thoracic facet block.  Since her last visit, she remained stable from a cardiac standpoint. ? ?Today she denies chest pain, shortness of breath, lower extremity edema,  fatigue, palpitations, melena, hematuria, hemoptysis, diaphoresis, weakness, presyncope, syncope, orthopnea, and PND. ? ? ? ?Home Medications  ?  ?Prior to Admission medications   ?Medication Sig Start Date End Date Taking? Authorizing Provider  ?acetaminophen (TYLENOL) 500 MG tablet Take 1,000 mg by mouth daily as needed.    [provider]  ?Albuterol Sulfate (PROAIR RESPICLICK) 419 (90 Base) MCG/ACT AEPB Inhale 2 puffs into the lungs every 6 (six) hours as needed (sob/wheezing). 12/04/20   Tyler Pita, MD  ?atorvastatin (LIPITOR) 80 MG tablet Take 1 tablet (80 mg total) by mouth daily. 09/12/21   Marnee Guarneri T, NP  ?cetirizine (ZYRTEC) 10 MG tablet Take 10 mg by mouth daily.    [provider]  ?cholecalciferol (VITAMIN D3) 25 MCG (1000 UNIT) tablet Take 1,000 Units by mouth daily.    [provider]  ?doxylamine, Sleep, (UNISOM) 25 MG tablet Take 50 mg by mouth at bedtime as needed.    [provider]  ?fluticasone (FLONASE) 50 MCG/ACT nasal spray Place 2 sprays into both nostrils daily.    [provider]  ?furosemide (LASIX) 20 MG tablet Take 1 tablet (20 mg total) by mouth daily as needed (wt gain / edema). 11/07/21   End, Harrell Gave, MD  ?ipratropium (ATROVENT) 0.03 % nasal spray SPRAY 2 SPRAYS INTO EACH NOSTRIL 3 TIMES A DAY AS NEEDED FOR RHINITIS 12/24/21   Cannady, Henrine Screws T, NP  ?levothyroxine (SYNTHROID) 75 MCG tablet TAKE 1 TABLET BY MOUTH EVERY DAY BEFORE BREAKFAST 12/24/21   Cannady, Henrine Screws T, NP  ?losartan (COZAAR) 25 MG tablet Take 0.5 tablets (12.5 mg total) by mouth daily. 09/12/21   Marnee Guarneri T, NP  ?metoprolol succinate (TOPROL-XL) 25 MG 24 hr tablet Take 0.5 tablets (12.5 mg total) by mouth daily. 09/12/21   Venita Lick, NP  ?Multiple Vitamins-Minerals (CENTRUM SILVER 50+WOMEN PO) Take 1 tablet by mouth daily at 6 (six) AM.    [provider]  ?nitroGLYCERIN (NITROSTAT) 0.4 MG SL tablet Place 1 tablet (0.4 mg total) under  the tongue every 5 (five) minutes as needed for chest pain. 11/05/19 07/12/29  End, Harrell Gave, MD  ?pantoprazole (PROTONIX) 40 MG tablet Take 1 tablet (40 mg total) by mouth daily. 09/12/21   Cannady, Henrine Screws T, NP  ?predniSONE (STERAPRED UNI-PAK 21 TAB) 10 MG (21) TBPK tablet Take 10 mg by mouth daily.    [provider]  ?PREMARIN vaginal cream PLACE 0.5 GRAMS TWICE WEEKLY INTRAVAGINALLY. 05/14/21   Marnee Guarneri T, NP  ?ticagrelor (BRILINTA) 60 MG TABS tablet TAKE 1 TABLET (60 MG TOTAL) BY MOUTH 2 (TWO) TIMES DAILY. 09/12/21   Cannady, Henrine Screws T, NP  ?triamcinolone cream (KENALOG) 0.1 % APPLY TO AFFECTED AREA TWICE A DAY 12/27/21   Cannady, Henrine Screws T, NP  ?umeclidinium-vilanterol (ANORO ELLIPTA) 62.5-25 MCG/INH AEPB Inhale 1 puff into the lungs daily. 02/19/21   Tyler Pita, MD  ? ? ?Physical Exam  ?  ?Vital Signs:  HULDA REDDIX does not have vital signs available for review today. ? ?Given telephonic nature of communication, physical  exam is limited. ?AAOx3. NAD. Normal affect.  Speech and respirations are unlabored. ? ?Accessory Clinical Findings  ?  ?None ? ?Assessment & Plan  ?  ?1.  Preoperative Cardiovascular Risk Assessment:-Thoracic facet block, [facility], Dr. Dossie Arbour, fax #[phone] ? ?  ? ?Primary Cardiologist: Nelva Bush, MD ? ?Chart reviewed as part of pre-operative protocol coverage. Given past medical history and time since last visit, based on ACC/AHA guidelines, DOSHIA DALIA would be at acceptable risk for the planned procedure without further cardiovascular testing.  ? ?Patient was advised that if she develops new symptoms prior to surgery to contact our office to arrange a follow-up appointment.  He verbalized understanding. ? ?Her ticagrelor may be held for 5 days prior to her procedure and resumed when it is felt to be safe from a periprocedural standpoint.  She should take 81 mg aspirin daily while she is off ticagrelor per Dr. Saunders Revel. ? ?A copy of this  note will be routed to requesting surgeon. ? ?Time:   ?Today, I have spent 9 minutes with the patient with telehealth technology discussing medical history, symptoms, and management plan.  I spent greater than

## 2022-01-17 ENCOUNTER — Encounter: Payer: Self-pay | Admitting: Pain Medicine

## 2022-01-17 DIAGNOSIS — Z7901 Long term (current) use of anticoagulants: Secondary | ICD-10-CM | POA: Insufficient documentation

## 2022-01-20 NOTE — Progress Notes (Signed)
PROVIDER NOTE: Interpretation of information contained herein should be left to medically-trained personnel. Specific patient instructions are provided elsewhere under "Patient Instructions" section of medical record. This document was created in part using STT-dictation technology, any transcriptional errors that may result from this process are unintentional.  ?Patient: April Raymond ?Type: Established ?DOB: 02-27-1961 ?MRN: [id number] ?PCP: Venita Lick, NP  Service: Procedure ?DOS: 01/24/2022 ?Setting: Ambulatory ?Location: Ambulatory outpatient facility ?Delivery: Face-to-face Provider: Gaspar Cola, MD ?Specialty: Interventional Pain Management ?Specialty designation: 09 ?Location: Outpatient facility ?Ref. Prov.: Venita Lick, NP   ? ?Primary Reason for Visit: Interventional Pain Management Treatment. ?CC: Back Pain (upper) ?  ?Procedure:          ? Type: Thoracic posterior paravertebral block (TPVB) #1  ?Laterality: Bilateral (-50)  ?Level: thoracocervical facet MBB (C7-T1 & T2-3)   ?Imaging: Fluoroscopic guidance ?Anesthesia: Local anesthesia (1-2% Lidocaine) ?Anxiolysis: None                 ?Sedation: None. ?DOS: 01/24/2022  ?Performed by: Gaspar Cola, MD ? ?Purpose: Diagnostic/Therapeutic ?Indications: Thoracic back pain severe enough to impact quality of life or function. ?Rationale (medical necessity): procedure needed and proper for the diagnosis and/or treatment of Ms. Mensing medical symptoms and needs. ?1. Thoracic facet syndrome   ?2. Spondylosis without myelopathy or radiculopathy, thoracic region   ?3. Chronic thoracic back pain (Bilateral) (R>L)   ?4. DDD (degenerative disc disease), thoracic   ?5. Grade 1 Anterolisthesis of cervical spine (C7/T1)   ?6. Grade 1 Anterolisthesis of thoracic spine (T2/T3)   ?7. Spasm of thoracic back muscle   ?8. Chronic upper back pain (1ry area of Pain) (Right)   ? Chronic anticoagulation (BRILINTA)   ? ?NAS-11 Pain score:  ? Pre-procedure:  4 /10  ? Post-procedure: 0-No pain/10  ? ?  ?Target: Thoracic posterior (dorsal) primary rami nerve ?Location: 2.5 cm lateral to midline (spinous process), just cephalad to upper transverse process edge.  (needle tip placement) ?Region: Thoracic  ?Approach: Paravertebral  ?Type of procedure: Percutaneous perineural nerve block  ? ?Pertinent Anatomy: There are two potential fascial compartments in the TPVS: the anterior extrapleural paravertebral compartment and the posterior subendothoracic paravertebral compartment. The transverse process and the superior costotransverse ligament form the posterior boundary. ? ?Position / Prep / Materials:  ?Position: Prone  ?Prep solution: DuraPrep (Iodine Povacrylex [0.7% available iodine] and Isopropyl Alcohol, 74% w/w) ?Prep Area: Entire upper back region ?Materials:  ?Tray: Block ?Needle(s):  ?Type: Spinal  ?Gauge (G): 22  ?Length: 3.5-in  ?Qty: 4 ? ?Pre-op H&P Assessment:  ?April Raymond is a 61 y.o. (year old), female patient, seen today for interventional treatment. She  has a past surgical history that includes Tonsillectomy; Parathyroidectomy; Cyst excision (x2); LEFT HEART CATH AND CORONARY ANGIOGRAPHY (N/A, 05/31/2020); Cardiac catheterization; Breast excisional biopsy (Left); Breast cyst aspiration (Right, 05/01/2021); Breast biopsy (Right, 05/04/2021); Chest wall reconstruction (Right, 05/09/2021); and Lipoma excision. April Raymond has a current medication list which includes the following prescription(s): acetaminophen, albuterol sulfate, atorvastatin, cetirizine, cholecalciferol, doxylamine (sleep), fluticasone, furosemide, ipratropium, levothyroxine, losartan, metoprolol succinate, multiple vitamins-minerals, nitroglycerin, pantoprazole, premarin, ticagrelor, triamcinolone cream, and anoro ellipta, and the following Facility-Administered Medications: pentafluoroprop-tetrafluoroeth. Her primarily concern today is the Back Pain (upper) ? ?Initial Vital Signs:  ?Pulse/HCG  Rate: 76ECG Heart Rate: 77 ?Temp: 98.2 ?F (36.8 ?C) ?Resp: 16 ?BP: 132/71 ?SpO2: 100 % ? ?BMI: Estimated body mass index is 23.4 kg/m? as calculated from the following: ?  Height as of this encounter:  $'5\' 6"'I$  (1.676 m). ?  Weight as of this encounter: 145 lb (65.8 kg). ? ?Risk Assessment: ?Allergies: Reviewed. She is allergic to azithromycin, humira [adalimumab], and tape.  ?Allergy Precautions: None required ?Coagulopathies: Reviewed. None identified.  ?Blood-thinner therapy: None at this time ?Active Infection(s): Reviewed. None identified. April Raymond is afebrile ? ?Site Confirmation: [name] was asked to confirm the procedure and laterality before marking the site ?Procedure checklist: Completed ?Consent: Before the procedure and under the influence of no sedative(s), amnesic(s), or anxiolytics, the patient was informed of the treatment options, risks and possible complications. To fulfill our ethical and legal obligations, as recommended by the American Medical Association's Code of Ethics, I have informed the patient of my clinical impression; the nature and purpose of the treatment or procedure; the risks, benefits, and possible complications of the intervention; the alternatives, including doing nothing; the risk(s) and benefit(s) of the alternative treatment(s) or procedure(s); and the risk(s) and benefit(s) of doing nothing. ?The patient was provided information about the general risks and possible complications associated with the procedure. These may include, but are not limited to: failure to achieve desired goals, infection, bleeding, organ or nerve damage, allergic reactions, paralysis, and death. ?In addition, the patient was informed of those risks and complications associated to Spine-related procedures, such as failure to decrease pain; infection (i.e.: Meningitis, epidural or intraspinal abscess); bleeding (i.e.: epidural hematoma, subarachnoid hemorrhage, or any other type of intraspinal or  peri-dural bleeding); organ or nerve damage (i.e.: Any type of peripheral nerve, nerve root, or spinal cord injury) with subsequent damage to sensory, motor, and/or autonomic systems, resulting in permanent pain, numbness, and/or weakness of one or several areas of the body; allergic reactions; (i.e.: anaphylactic reaction); and/or death. ?Furthermore, the patient was informed of those risks and complications associated with the medications. These include, but are not limited to: allergic reactions (i.e.: anaphylactic or anaphylactoid reaction(s)); adrenal axis suppression; blood sugar elevation that in diabetics may result in ketoacidosis or comma; water retention that in patients with history of congestive heart failure may result in shortness of breath, pulmonary edema, and decompensation with resultant heart failure; weight gain; swelling or edema; medication-induced neural toxicity; particulate matter embolism and blood vessel occlusion with resultant organ, and/or nervous system infarction; and/or aseptic necrosis of one or more joints. ?Finally, the patient was informed that Medicine is not an exact science; therefore, there is also the possibility of unforeseen or unpredictable risks and/or possible complications that may result in a catastrophic outcome. The patient indicated having understood very clearly. We have given the patient no guarantees and we have made no promises. Enough time was given to the patient to ask questions, all of which were answered to the patient's satisfaction. Ms. Hodgens has indicated that she wanted to continue with the procedure. ?Attestation: I, the ordering provider, attest that I have discussed with the patient the benefits, risks, side-effects, alternatives, likelihood of achieving goals, and potential problems during recovery for the procedure that I have provided informed consent. ?Date  Time: 01/24/2022 10:30 AM ? ?Pre-Procedure Preparation:  ?Monitoring: As per clinic  protocol. Respiration, ETCO2, SpO2, BP, heart rate and rhythm monitor placed and checked for adequate function ?Safety Precautions: Patient was assessed for positional comfort and pressure points before starting the

## 2022-01-24 ENCOUNTER — Ambulatory Visit (HOSPITAL_BASED_OUTPATIENT_CLINIC_OR_DEPARTMENT_OTHER): Payer: BLUE CROSS/BLUE SHIELD | Admitting: Pain Medicine

## 2022-01-24 ENCOUNTER — Encounter: Payer: Self-pay | Admitting: Pain Medicine

## 2022-01-24 ENCOUNTER — Ambulatory Visit
Admission: RE | Admit: 2022-01-24 | Discharge: 2022-01-24 | Disposition: A | Discharge status: Home / Self Care | Payer: BLUE CROSS/BLUE SHIELD | Source: Ambulatory Visit | Attending: Pain Medicine | Admitting: Pain Medicine

## 2022-01-24 VITALS — BP 130/69 | HR 76 | Temp 98.2°F | Resp 16 | Ht 66.0 in | Wt 145.0 lb

## 2022-01-24 DIAGNOSIS — M549 Dorsalgia, unspecified: Secondary | ICD-10-CM | POA: Insufficient documentation

## 2022-01-24 DIAGNOSIS — M4312 Spondylolisthesis, cervical region: Secondary | ICD-10-CM

## 2022-01-24 DIAGNOSIS — G8929 Other chronic pain: Secondary | ICD-10-CM | POA: Insufficient documentation

## 2022-01-24 DIAGNOSIS — M47814 Spondylosis without myelopathy or radiculopathy, thoracic region: Secondary | ICD-10-CM

## 2022-01-24 DIAGNOSIS — Z7901 Long term (current) use of anticoagulants: Secondary | ICD-10-CM | POA: Diagnosis present

## 2022-01-24 DIAGNOSIS — M47894 Other spondylosis, thoracic region: Secondary | ICD-10-CM | POA: Diagnosis present

## 2022-01-24 DIAGNOSIS — M4314 Spondylolisthesis, thoracic region: Secondary | ICD-10-CM

## 2022-01-24 DIAGNOSIS — M62830 Muscle spasm of back: Secondary | ICD-10-CM | POA: Insufficient documentation

## 2022-01-24 DIAGNOSIS — M5134 Other intervertebral disc degeneration, thoracic region: Secondary | ICD-10-CM | POA: Insufficient documentation

## 2022-01-24 DIAGNOSIS — M546 Pain in thoracic spine: Secondary | ICD-10-CM | POA: Diagnosis present

## 2022-01-24 MED ORDER — LIDOCAINE HCL 2 % IJ SOLN
20.0000 mL | Freq: Once | INTRAMUSCULAR | Status: AC
  Administered 2022-01-24: 400 mg

## 2022-01-24 MED ORDER — ROPIVACAINE HCL 2 MG/ML IJ SOLN
INTRAMUSCULAR | Status: AC
  Filled 2022-01-24: qty 20

## 2022-01-24 MED ORDER — DEXAMETHASONE SODIUM PHOSPHATE 10 MG/ML IJ SOLN
20.0000 mg | Freq: Once | INTRAMUSCULAR | Status: AC
  Administered 2022-01-24: 20 mg

## 2022-01-24 MED ORDER — DEXAMETHASONE SODIUM PHOSPHATE 10 MG/ML IJ SOLN
INTRAMUSCULAR | Status: AC
  Filled 2022-01-24: qty 2

## 2022-01-24 MED ORDER — MIDAZOLAM HCL 5 MG/5ML IJ SOLN
INTRAMUSCULAR | Status: AC
  Filled 2022-01-24: qty 5

## 2022-01-24 MED ORDER — LACTATED RINGERS IV SOLN
1000.0000 mL | Freq: Once | INTRAVENOUS | Status: AC
  Administered 2022-01-24: 1000 mL via INTRAVENOUS

## 2022-01-24 MED ORDER — PENTAFLUOROPROP-TETRAFLUOROETH EX AERO: INHALATION_SPRAY | Freq: Once | CUTANEOUS | Status: DC

## 2022-01-24 MED ORDER — MIDAZOLAM HCL 5 MG/5ML IJ SOLN
0.5000 mg | Freq: Once | INTRAMUSCULAR | Status: AC
  Administered 2022-01-24: 2 mg via INTRAVENOUS

## 2022-01-24 MED ORDER — ROPIVACAINE HCL 2 MG/ML IJ SOLN
18.0000 mL | Freq: Once | INTRAMUSCULAR | Status: AC
  Administered 2022-01-24: 18 mL via PERINEURAL

## 2022-01-24 MED ORDER — LIDOCAINE HCL 2 % IJ SOLN
INTRAMUSCULAR | Status: AC
  Filled 2022-01-24: qty 20

## 2022-01-24 NOTE — Patient Instructions (Signed)

## 2022-01-25 ENCOUNTER — Telehealth: Payer: Self-pay | Admitting: *Deleted

## 2022-01-25 NOTE — Telephone Encounter (Signed)
Post procedure call;  patient states she did not sleep very well last evening but apart from that she is doing well.   ?

## 2022-02-18 NOTE — Progress Notes (Unsigned)
PROVIDER NOTE: Information contained herein reflects review and annotations entered in association with encounter. Interpretation of such information and data should be left to medically-trained personnel. Information provided to patient can be located elsewhere in the medical record under "Patient Instructions". Document created using STT-dictation technology, any transcriptional errors that may result from process are unintentional.    Patient: April Raymond  Service Category: E/M  Provider: Gaspar Cola, MD  DOB: 09/15/1961  DOS: 02/19/2022  Specialty: Interventional Pain Management  MRN: [id number]  Setting: Ambulatory outpatient  PCP: Venita Lick, NP  Type: Established Patient    Referring Provider: Venita Lick, NP  Location: Office  Delivery: Face-to-face     HPI  April Raymond, a 61 y.o. year old female, is here today because of her No primary diagnosis found.. Ms. Tuckett primary complain today is No chief complaint on file. Last encounter: My last encounter with her was on 01/24/2022. Pertinent problems: Ms. Bassinger has H/O lipoma; Chronic upper back pain (1ry area of Pain) (Right); Chronic thoracic back pain (Bilateral) (R>L); Chronic neck pain (2ry area of Pain) (Bilateral) (R>L); Chronic shoulder pain (3ry area of Pain) (Bilateral); Painful cervical range of motion; Impaired range of motion of cervical spine; Spasm of thoracic back muscle; Chronic pain syndrome; Cervicalgia; DDD (degenerative disc disease), cervical; Grade 1 Anterolisthesis of cervical spine (C7/T1); Grade 1 Anterolisthesis of thoracic spine (T2/T3); Intercostal neuralgia (Right); DDD (degenerative disc disease), thoracic; Thoracic facet syndrome; Cervical facet syndrome; and Spondylosis without myelopathy or radiculopathy, thoracic region on their pertinent problem list. Pain Assessment: Severity of   is reported as a  /10. Location:    / . Onset:  . Quality:  . Timing:  . Modifying factor(s):  Marland Kitchen Vitals:   vitals were not taken for this visit.   Reason for encounter: post-procedure evaluation and assessment. ***  Post-procedure evaluation   Type: Thoracic posterior paravertebral block (TPVB) #1  Laterality: Bilateral (-50)  Level: thoracocervical facet MBB (C7-T1 & T2-3)   Imaging: Fluoroscopic guidance Anesthesia: Local anesthesia (1-2% Lidocaine) Anxiolysis: None                 Sedation: None. DOS: 01/24/2022  Performed by: Gaspar Cola, MD  Purpose: Diagnostic/Therapeutic Indications: Thoracic back pain severe enough to impact quality of life or function. Rationale (medical necessity): procedure needed and proper for the diagnosis and/or treatment of Ms. Bacchi medical symptoms and needs. 1. Thoracic facet syndrome   2. Spondylosis without myelopathy or radiculopathy, thoracic region   3. Chronic thoracic back pain (Bilateral) (R>L)   4. DDD (degenerative disc disease), thoracic   5. Grade 1 Anterolisthesis of cervical spine (C7/T1)   6. Grade 1 Anterolisthesis of thoracic spine (T2/T3)   7. Spasm of thoracic back muscle   8. Chronic upper back pain (1ry area of Pain) (Right)    Chronic anticoagulation (BRILINTA)    NAS-11 Pain score:   Pre-procedure: 4 /10   Post-procedure: 0-No pain/10      Effectiveness:  Initial hour after procedure:   ***. Subsequent 4-6 hours post-procedure:   ***. Analgesia past initial 6 hours:   ***. Ongoing improvement:  Analgesic:  *** Function:  ***  ROM:  ***   Pharmacotherapy Assessment  Analgesic: None MME/day: 0 mg/day   Monitoring: [place] PMP: PDMP not reviewed this encounter.       Pharmacotherapy: No side-effects or adverse reactions reported. Compliance: No problems identified. Effectiveness: Clinically acceptable.  No notes on file  UDS:  Summary  Date Value Ref Range Status  10/22/2021 Note  Final    Comment:    ==================================================================== Compliance Drug Analysis,  [place] ==================================================================== Test                             Result       Flag       Units  Drug Present and Declared for Prescription Verification   Metoprolol                     PRESENT      EXPECTED  Drug Present not Declared for Prescription Verification   Acetaminophen                  PRESENT      UNEXPECTED   Diphenhydramine                PRESENT      UNEXPECTED ==================================================================== Test                      Result    Flag   Units      Ref Range   Creatinine              73               mg/dL      >=20 ==================================================================== Declared Medications:  The flagging and interpretation on this report are based on the  following declared medications.  Unexpected results may arise from  inaccuracies in the declared medications.   **Note: The testing scope of this panel includes these medications:   Metoprolol (Toprol)   **Note: The testing scope of this panel does not include the  following reported medications:   Albuterol (Proair HFA)  Atorvastatin (Lipitor)  Estrogens (Premarin)  Furosemide (Lasix)  Levothyroxine (Synthroid)  Losartan (Cozaar)  Nitroglycerin (Nitrostat)  Nystatin (Mycostatin)  Pantoprazole (Protonix)  Ticagrelor (Brilinta)  Umeclidinium (Anoro)  Vilanterol (Anoro) ==================================================================== For clinical consultation, please call [phone]. ====================================================================      ROS  Constitutional: Denies any fever or chills Gastrointestinal: No reported hemesis, hematochezia, vomiting, or acute GI distress Musculoskeletal: Denies any acute onset joint swelling, redness, loss of ROM, or weakness Neurological: No reported episodes of acute onset apraxia, aphasia, dysarthria, agnosia, amnesia, paralysis, loss of coordination, or loss  of consciousness  Medication Review  Albuterol Sulfate, Multiple Vitamins-Minerals, acetaminophen, atorvastatin, cetirizine, cholecalciferol, conjugated estrogens, doxylamine (Sleep), fluticasone, furosemide, ipratropium, levothyroxine, losartan, metoprolol succinate, nitroGLYCERIN, pantoprazole, ticagrelor, triamcinolone cream, and umeclidinium-vilanterol  History Review  Allergy: Ms. Traynor is allergic to azithromycin, humira [adalimumab], and tape. Drug: Ms. Graybeal  reports no history of drug use. Alcohol:  reports current alcohol use. Tobacco:  reports that she quit smoking about 6 years ago. Her smoking use included cigarettes. She has a 40.00 pack-year smoking history. She has never used smokeless tobacco. Social: Ms. Riles  reports that she quit smoking about 6 years ago. Her smoking use included cigarettes. She has a 40.00 pack-year smoking history. She has never used smokeless tobacco. She reports current alcohol use. She reports that she does not use drugs. Medical:  has a past medical history of Alpha-1-antitrypsin deficiency carrier (02/20/2021), Anginal pain ([place]), CAD (coronary artery disease), COPD (chronic obstructive pulmonary disease) ([place]), Hashimoto's disease, Hidradenitis suppurativa, Hydradenitis, Hyperparathyroidism ([place]), Hypertension, Myocardial infarction ([place]) (2017), Pericardial effusion, and Takotsubo cardiomyopathy. Surgical: Ms. Vejar  has a past surgical history that includes Tonsillectomy;  Parathyroidectomy; Cyst excision (x2); LEFT HEART CATH AND CORONARY ANGIOGRAPHY (N/A, 05/31/2020); Cardiac catheterization; Breast excisional biopsy (Left); Breast cyst aspiration (Right, 05/01/2021); Breast biopsy (Right, 05/04/2021); Chest wall reconstruction (Right, 05/09/2021); and Lipoma excision. Family: family history includes Alzheimer's disease in her paternal grandmother; Breast cancer in her cousin; Breast cancer (age of onset: 17) in her mother; COPD in her mother; Cancer in her  mother; Diverticulitis in her mother; Heart attack in her paternal grandfather; Heart attack (age of onset: 66) in her mother; Hypertension in her brother, father, and mother; Stroke in her maternal grandfather and mother.  Laboratory Chemistry Profile   Renal Lab Results  Component Value Date   BUN 22 09/12/2021   CREATININE 1.00 09/12/2021   BCR 22 09/12/2021   GFRAA 59 (L) 09/22/2020   GFRNONAA 49 (L) 05/10/2021    Hepatic Lab Results  Component Value Date   AST 22 09/12/2021   ALT 21 09/12/2021   ALBUMIN 4.7 09/12/2021   ALKPHOS 46 09/12/2021   LIPASE 37 05/30/2020    Electrolytes Lab Results  Component Value Date   NA 141 09/12/2021   K 4.4 09/12/2021   CL 101 09/12/2021   CALCIUM 10.0 09/12/2021   MG 2.1 09/12/2021    Bone Lab Results  Component Value Date   VD25OH 49.5 09/12/2021    Inflammation (CRP: Acute Phase) (ESR: Chronic Phase) Lab Results  Component Value Date   CRP <1 10/22/2021   ESRSEDRATE 8 10/22/2021         Note: Above Lab results reviewed.  Recent Imaging Review  DG PAIN CLINIC C-ARM 1-60 MIN NO REPORT Fluoro was used, but no Radiologist interpretation will be provided.  Please refer to "NOTES" tab for provider progress note. Note: Reviewed        Physical Exam  General appearance: Well nourished, well developed, and well hydrated. In no apparent acute distress Mental status: Alert, oriented x 3 (person, place, & time)       Respiratory: No evidence of acute respiratory distress Eyes: PERLA Vitals: LMP 11/16/2007 (Approximate)  BMI: Estimated body mass index is 23.4 kg/m as calculated from the following:   Height as of 01/24/22: '5\' 6"'  (1.676 m).   Weight as of 01/24/22: 145 lb (65.8 kg). Ideal: Patient weight not recorded  Assessment   Diagnosis Status  No diagnosis found. Controlled Controlled Controlled   Updated Problems: No problems updated.  Plan of Care  Problem-specific:  No problem-specific Assessment & Plan notes  found for this encounter.  Ms. SALISA BROZ has a current medication list which includes the following long-term medication(s): albuterol sulfate, atorvastatin, cetirizine, doxylamine (sleep), fluticasone, furosemide, ipratropium, levothyroxine, losartan, metoprolol succinate, nitroglycerin, and pantoprazole.  Pharmacotherapy (Medications Ordered): No orders of the defined types were placed in this encounter.  Orders:  No orders of the defined types were placed in this encounter.  Follow-up plan:   No follow-ups on file.     Interventional Therapies  Risk  Complexity Considerations:   Estimated body mass index is 22.6 kg/m as calculated from the following:   Height as of this encounter: '5\' 6"'  (1.676 m).   Weight as of this encounter: 140 lb (63.5 kg). Brilinta ANTICOAGULATION (Stop: 5-7 days  Restart: 6 hours) (Antiplatelet)   Planned  Pending:   Diagnostic bilateral thoracocervical facet MBB (C7-T1 & T2-3) #1    Under consideration:   Diagnostic bilateral thoracocervical facet MBB (C7-T1 & T2-3) #1  Possible thoracal cervical facet RFA (depending on results of diagnostic  blocks)    Completed:   None at this time   Therapeutic  Palliative (PRN) options:   None established     Recent Visits Date Type Provider Dept  01/24/22 Procedure visit Milinda Pointer, MD [facility]  01/09/22 Office Visit Milinda Pointer, MD [facility]  Showing recent visits within past 90 days and meeting all other requirements Future Appointments Date Type Provider Dept  02/19/22 Appointment Milinda Pointer, MD [facility]  Showing future appointments within next 90 days and meeting all other requirements  I discussed the assessment and treatment plan with the patient. The patient was provided an opportunity to ask questions and all were answered. The patient agreed with the plan and demonstrated an understanding of the instructions.  Patient advised  to call back or seek an in-person evaluation if the symptoms or condition worsens.  Duration of encounter: *** minutes.  Note by: Gaspar Cola, MD Date: 02/19/2022; Time: 2:25 PM

## 2022-02-19 ENCOUNTER — Other Ambulatory Visit: Payer: Self-pay

## 2022-02-19 ENCOUNTER — Encounter: Payer: Self-pay | Admitting: Pain Medicine

## 2022-02-19 ENCOUNTER — Ambulatory Visit: Payer: BLUE CROSS/BLUE SHIELD | Attending: Pain Medicine | Admitting: Pain Medicine

## 2022-02-19 VITALS — BP 134/73 | HR 80 | Temp 98.4°F | Resp 16 | Ht 66.0 in | Wt 145.0 lb

## 2022-02-19 DIAGNOSIS — M549 Dorsalgia, unspecified: Secondary | ICD-10-CM | POA: Diagnosis present

## 2022-02-19 DIAGNOSIS — M5134 Other intervertebral disc degeneration, thoracic region: Secondary | ICD-10-CM | POA: Insufficient documentation

## 2022-02-19 DIAGNOSIS — M546 Pain in thoracic spine: Secondary | ICD-10-CM | POA: Insufficient documentation

## 2022-02-19 DIAGNOSIS — G8929 Other chronic pain: Secondary | ICD-10-CM | POA: Insufficient documentation

## 2022-02-19 DIAGNOSIS — M62830 Muscle spasm of back: Secondary | ICD-10-CM | POA: Diagnosis present

## 2022-02-19 DIAGNOSIS — M47894 Other spondylosis, thoracic region: Secondary | ICD-10-CM | POA: Insufficient documentation

## 2022-02-19 DIAGNOSIS — M4314 Spondylolisthesis, thoracic region: Secondary | ICD-10-CM | POA: Diagnosis present

## 2022-02-19 DIAGNOSIS — M47814 Spondylosis without myelopathy or radiculopathy, thoracic region: Secondary | ICD-10-CM | POA: Insufficient documentation

## 2022-02-19 NOTE — Progress Notes (Signed)
Safety precautions to be maintained throughout the outpatient stay will include: orient to surroundings, keep bed in low position, maintain call bell within reach at all times, provide assistance with transfer out of bed and ambulation.  

## 2022-02-19 NOTE — Patient Instructions (Addendum)
______________________________________________________________________  Preparing for Procedure with Sedation  NOTICE: Due to recent regulatory changes, starting on April 16, 2021, procedures requiring intravenous (IV) sedation will no longer be performed at the [facility].  These types of procedures are required to be performed at [facility] ambulatory surgery facility.  We are very sorry for the inconvenience.  Procedure appointments are limited to planned procedures: No Prescription Refills. No disability issues will be discussed. No medication changes will be discussed.  Instructions: Oral Intake: Do not eat or drink anything for at least 8 hours prior to your procedure. (Exception: Blood Pressure Medication. See below.) Transportation: A driver is required. You may not drive yourself after the procedure. Blood Pressure Medicine: Do not forget to take your blood pressure medicine with a sip of water the morning of the procedure. If your Diastolic (lower reading) is above 100 mmHg, elective cases will be cancelled/rescheduled. Blood thinners: These will need to be stopped for procedures. Notify our staff if you are taking any blood thinners. Depending on which one you take, there will be specific instructions on how and when to stop it. Diabetics on insulin: Notify the staff so that you can be scheduled 1st case in the morning. If your diabetes requires high dose insulin, take only  of your normal insulin dose the morning of the procedure and notify the staff that you have done so. Preventing infections: Shower with an antibacterial soap the morning of your procedure. Build-up your immune system: Take 1000 mg of Vitamin C with every meal (3 times a day) the day prior to your procedure. Antibiotics: Inform the staff if you have a condition or reason that requires you to take antibiotics before dental procedures. Pregnancy: If you are pregnant, call and cancel the procedure. Sickness: If  you have a cold, fever, or any active infections, call and cancel the procedure. Arrival: You must be in the facility at least 30 minutes prior to your scheduled procedure. Children: Do not bring children with you. Dress appropriately: There is always the possibility that your clothing may get soiled. Valuables: Do not bring any jewelry or valuables.  Reasons to call and reschedule or cancel your procedure: (Following these recommendations will minimize the risk of a serious complication.) Surgeries: Avoid having procedures within 2 weeks of any surgery. (Avoid for 2 weeks before or after any surgery). Flu Shots: Avoid having procedures within 2 weeks of a flu shots. (Avoid for 2 weeks before or after immunizations). Barium: Avoid having a procedure within 7-10 days after having had a radiological study involving the use of radiological contrast. (Myelograms, Barium swallow or enema study). Heart attacks: Avoid any elective procedures or surgeries for the initial 6 months after a "Myocardial Infarction" (Heart Attack). Blood thinners: It is imperative that you stop these medications before procedures. Let us know if you if you take any blood thinner.  Infection: Avoid procedures during or within two weeks of an infection (including chest colds or gastrointestinal problems). Symptoms associated with infections include: Localized redness, fever, chills, night sweats or profuse sweating, burning sensation when voiding, cough, congestion, stuffiness, runny nose, sore throat, diarrhea, nausea, vomiting, cold or Flu symptoms, recent or current infections. It is specially important if the infection is over the area that we intend to treat. Heart and lung problems: Symptoms that may suggest an active cardiopulmonary problem include: cough, chest pain, breathing difficulties or shortness of breath, dizziness, ankle swelling, uncontrolled high or unusually low blood pressure, and/or palpitations. If you are    experiencing any of these symptoms, cancel your procedure and contact your primary care physician for an evaluation.  Remember:  Regular Business hours are:  Monday to Thursday 8:00 AM to 4:00 PM  Provider's Schedule: Francisco Naveira, MD:  Procedure days: Tuesday and Thursday 7:30 AM to 4:00 PM  Bilal Lateef, MD:  Procedure days: Monday and Wednesday 7:30 AM to 4:00 PM ______________________________________________________________________  ____________________________________________________________________________________________  General Risks and Possible Complications  Patient Responsibilities: It is important that you read this as it is part of your informed consent. It is our duty to inform you of the risks and possible complications associated with treatments offered to you. It is your responsibility as a patient to read this and to ask questions about anything that is not clear or that you believe was not covered in this document.  Patient's Rights: You have the right to refuse treatment. You also have the right to change your mind, even after initially having agreed to have the treatment done. However, under this last option, if you wait until the last second to change your mind, you may be charged for the materials used up to that point.  Introduction: Medicine is not an exact science. Everything in Medicine, including the lack of treatment(s), carries the potential for danger, harm, or loss (which is by definition: Risk). In Medicine, a complication is a secondary problem, condition, or disease that can aggravate an already existing one. All treatments carry the risk of possible complications. The fact that a side effects or complications occurs, does not imply that the treatment was conducted incorrectly. It must be clearly understood that these can happen even when everything is done following the highest safety standards.  No treatment: You can choose not to proceed with the  proposed treatment alternative. The "PRO(s)" would include: avoiding the risk of complications associated with the therapy. The "CON(s)" would include: not getting any of the treatment benefits. These benefits fall under one of three categories: diagnostic; therapeutic; and/or palliative. Diagnostic benefits include: getting information which can ultimately lead to improvement of the disease or symptom(s). Therapeutic benefits are those associated with the successful treatment of the disease. Finally, palliative benefits are those related to the decrease of the primary symptoms, without necessarily curing the condition (example: decreasing the pain from a flare-up of a chronic condition, such as incurable terminal cancer).  General Risks and Complications: These are associated to most interventional treatments. They can occur alone, or in combination. They fall under one of the following six (6) categories: no benefit or worsening of symptoms; bleeding; infection; nerve damage; allergic reactions; and/or death. No benefits or worsening of symptoms: In Medicine there are no guarantees, only probabilities. No healthcare provider can ever guarantee that a medical treatment will work, they can only state the probability that it may. Furthermore, there is always the possibility that the condition may worsen, either directly, or indirectly, as a consequence of the treatment. Bleeding: This is more common if the patient is taking a blood thinner, either prescription or over the counter (example: Goody Powders, Fish oil, Aspirin, Garlic, etc.), or if suffering a condition associated with impaired coagulation (example: Hemophilia, cirrhosis of the liver, low platelet counts, etc.). However, even if you do not have one on these, it can still happen. If you have any of these conditions, or take one of these drugs, make sure to notify your treating physician. Infection: This is more common in patients with a compromised  immune system, either due to disease (example:   diabetes, cancer, human immunodeficiency virus [HIV], etc.), or due to medications or treatments (example: therapies used to treat cancer and rheumatological diseases). However, even if you do not have one on these, it can still happen. If you have any of these conditions, or take one of these drugs, make sure to notify your treating physician. Nerve Damage: This is more common when the treatment is an invasive one, but it can also happen with the use of medications, such as those used in the treatment of cancer. The damage can occur to small secondary nerves, or to large primary ones, such as those in the spinal cord and brain. This damage may be temporary or permanent and it may lead to impairments that can range from temporary numbness to permanent paralysis and/or brain death. Allergic Reactions: Any time a substance or material comes in contact with our body, there is the possibility of an allergic reaction. These can range from a mild skin rash (contact dermatitis) to a severe systemic reaction (anaphylactic reaction), which can result in death. Death: In general, any medical intervention can result in death, most of the time due to an unforeseen complication. Stop Brillinta 5 days before procedure. ____________________________________________________________________________________________

## 2022-02-26 ENCOUNTER — Other Ambulatory Visit: Payer: Self-pay | Admitting: Pulmonary Disease

## 2022-03-10 NOTE — Patient Instructions (Signed)

## 2022-03-13 ENCOUNTER — Encounter: Payer: Self-pay | Admitting: Nurse Practitioner

## 2022-03-13 ENCOUNTER — Ambulatory Visit: Payer: No Typology Code available for payment source | Admitting: Nurse Practitioner

## 2022-03-13 VITALS — BP 97/65 | HR 76 | Temp 97.9°F | Ht 66.0 in | Wt 147.8 lb

## 2022-03-13 DIAGNOSIS — E892 Postprocedural hypoparathyroidism: Secondary | ICD-10-CM

## 2022-03-13 DIAGNOSIS — N1832 Chronic kidney disease, stage 3b: Secondary | ICD-10-CM

## 2022-03-13 DIAGNOSIS — Z23 Encounter for immunization: Secondary | ICD-10-CM

## 2022-03-13 DIAGNOSIS — I25118 Atherosclerotic heart disease of native coronary artery with other forms of angina pectoris: Secondary | ICD-10-CM

## 2022-03-13 DIAGNOSIS — I10 Essential (primary) hypertension: Secondary | ICD-10-CM

## 2022-03-13 DIAGNOSIS — I252 Old myocardial infarction: Secondary | ICD-10-CM

## 2022-03-13 DIAGNOSIS — Z87891 Personal history of nicotine dependence: Secondary | ICD-10-CM

## 2022-03-13 DIAGNOSIS — J449 Chronic obstructive pulmonary disease, unspecified: Secondary | ICD-10-CM

## 2022-03-13 DIAGNOSIS — I428 Other cardiomyopathies: Secondary | ICD-10-CM

## 2022-03-13 DIAGNOSIS — E785 Hyperlipidemia, unspecified: Secondary | ICD-10-CM

## 2022-03-13 DIAGNOSIS — E063 Autoimmune thyroiditis: Secondary | ICD-10-CM

## 2022-03-13 DIAGNOSIS — I739 Peripheral vascular disease, unspecified: Secondary | ICD-10-CM

## 2022-03-13 MED ORDER — SHINGRIX 50 MCG/0.5ML IM SUSR: 0.5000 mL | Freq: Once | INTRAMUSCULAR | 0 refills | Status: AC

## 2022-03-13 NOTE — Assessment & Plan Note (Signed)
Chronic, stable. Continue current medication regimen and adjust as needed. No requiring use of Albuterol. Recommend continue cessation of smoking and collaboration with pulmonology.

## 2022-03-13 NOTE — Assessment & Plan Note (Signed)
Continue collaboration of cardiology and current regimen as prescribed by them.

## 2022-03-13 NOTE — Assessment & Plan Note (Signed)
Chronic, ongoing with BP well below goal at this time. Will continue current medication regimen and adjust as needed.  Continue collaboration with cardiology due to her significant family and personal cardiac history.  Recommend she monitor BP at least a few mornings a week at home and document.  DASH diet at home. Labs today: CMP and lipid.  Return in 6 months.

## 2022-03-13 NOTE — Progress Notes (Signed)
No it was not in the notes that she needed a physical.

## 2022-03-13 NOTE — Progress Notes (Signed)
BP 97/65   Pulse 76   Temp 97.9 F (36.6 C) (Oral)   Ht '5\' 6"'$  (1.676 m)   Wt 147 lb 12.8 oz (67 kg)   LMP 11/16/2007 (Approximate)   SpO2 99%   BMI 23.86 kg/m    Subjective:    Patient ID: April Raymond, female    DOB: 03-Mar-1961, 61 y.o.   MRN: [id number]  HPI: April Raymond is a 61 y.o. female  Chief Complaint  Patient presents with   Hyperlipidemia   Hypertension   Hypothyroidism   Chronic Kidney Disease   NOTE WRITTEN BY FNP STUDENT.  ASSESSMENT AND PLAN OF CARE REVIEWED WITH STUDENT, AGREE WITH ABOVE FINDINGS AND PLAN.   HYPERTENSION WITH CAD & HLD Followed by cardiology and last visit 01/15/22, stress-induced cardiomyopathy and angina.    History of MI at age 44.  Has a family history of MI, mother in her 57's and her father's dad passed in his 36's.  Had parathyroid removed in 2018, history of hyperparathyroid.  She continues on Ticagrelor, Metoprolol, Losartan, Lasix daily and PRN (most often taking second pill), and NTG.  Has not had to use any NTG and is beginning to exercise again.  Echo on 10/05/20 noted EF 60-65%. Taking Atorvastatin 80 MG. Satisfied with current treatment? yes Duration of hypertension: chronic BP monitoring frequency: rarely BP range:  BP medication side effects: no Duration of hyperlipidemia: years Cholesterol medication side effects: no Cholesterol supplements: none Medication compliance: excellent compliance Aspirin: yes Recent stressors: no Recurrent headaches: no Visual changes: no Palpitations: no Dyspnea: no Chest pain: no Lower extremity edema: no Dizzy/lightheaded: no  The ASCVD Risk score (Arnett DK, et al., 2019) failed to calculate for the following reasons:   The patient has a prior MI or stroke diagnosis   CHRONIC KIDNEY DISEASE CKD status: controlled Medications renally dose: no Previous renal evaluation: no Pneumovax:   Not applicable Influenza Vaccine:  Up to Date   HYPOTHYROIDISM Continues on Levothyroxine  75 MCG daily.   Thyroid control status:controlled Satisfied with current treatment? yes Medication side effects: no Medication compliance: good compliance Etiology of hypothyroidism:  Recent dose adjustment:no Fatigue: no Cold intolerance: no Heat intolerance: no Weight gain: no Weight loss: no Constipation: no Diarrhea/loose stools: no Palpitations: no Lower extremity edema: no Anxiety/depressed mood: no   COPD History of smoking, quit at 54 -- smoked 1 PPD.  Did see pulmonary, last 02/19/21.  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  Continues Anoro and has Albuterol to use as needed, but has not needed to use. COPD status: controlled Satisfied with current treatment?: yes Oxygen use: no Dyspnea frequency: none Cough frequency: none Rescue inhaler frequency:  none Limitation of activity: no Productive cough: none Last Spirometry: with pulmonary   Relevant past medical, surgical, family and social history reviewed and updated as indicated. Interim medical history since our last visit reviewed. Allergies and medications reviewed and updated.  Review of Systems  Constitutional:  Negative for activity change, appetite change, diaphoresis, fatigue and fever.  Respiratory:  Negative for cough, chest tightness, shortness of breath and wheezing.   Cardiovascular:  Negative for chest pain, palpitations and leg swelling.  Gastrointestinal: Negative.   Endocrine: Negative for cold intolerance and heat intolerance.  Neurological: Negative.   Psychiatric/Behavioral: Negative.      Per HPI unless specifically indicated above     Objective:    BP 97/65   Pulse 76   Temp 97.9 F (36.6 C) (  Oral)   Ht '5\' 6"'$  (1.676 m)   Wt 147 lb 12.8 oz (67 kg)   LMP 11/16/2007 (Approximate)   SpO2 99%   BMI 23.86 kg/m   Wt Readings from Last 3 Encounters:  03/13/22 147 lb 12.8 oz (67 kg)  02/19/22 145 lb (65.8 kg)  01/24/22 145 lb (65.8 kg)    Physical Exam Vitals  and nursing note reviewed.  Constitutional:      General: She is awake. She is not in acute distress.    Appearance: She is well-developed and well-groomed. She is not ill-appearing.  HENT:     Head: Normocephalic.     Right Ear: Hearing normal.     Left Ear: Hearing normal.  Eyes:     General: Lids are normal.        Right eye: No discharge.        Left eye: No discharge.     Conjunctiva/sclera: Conjunctivae normal.     Pupils: Pupils are equal, round, and reactive to light.  Neck:     Thyroid: No thyromegaly or thyroid tenderness.  Cardiovascular:     Rate and Rhythm: Normal rate and regular rhythm.     Pulses: Normal pulses.     Heart sounds: Normal heart sounds. No murmur heard.    No gallop.  Pulmonary:     Effort: Pulmonary effort is normal. No accessory muscle usage or respiratory distress.     Breath sounds: Normal breath sounds.  Abdominal:     General: Bowel sounds are normal.     Palpations: Abdomen is soft.  Musculoskeletal:     Cervical back: Normal range of motion and neck supple.     Right lower leg: No edema.     Left lower leg: No edema.  Lymphadenopathy:     Cervical: No cervical adenopathy.  Skin:    General: Skin is warm and dry.  Neurological:     General: No focal deficit present.     Mental Status: She is alert and oriented to person, place, and time.     Deep Tendon Reflexes: Reflexes are normal and symmetric.  Psychiatric:        Attention and Perception: Attention normal.        Mood and Affect: Mood normal.        Speech: Speech normal.        Behavior: Behavior normal. Behavior is cooperative.        Thought Content: Thought content normal.    Results for orders placed or performed during the hospital encounter of 11/23/21  COVID-19, Flu A+B and RSV ([facility])  Result Value Ref Range   SARS-CoV-2, NAA Not Detected Not Detected   Influenza A, NAA Not Detected Not Detected   Influenza B, NAA Not Detected Not Detected   RSV, NAA Not  Detected Not Detected   Test Information: Comment   POCT rapid strep A  Result Value Ref Range   Rapid Strep A Screen Negative Negative      Assessment & Plan:   Problem List Items Addressed This Visit       Cardiovascular and Mediastinum   Coronary artery disease of native artery of native heart with stable angina pectoris (HCC)    Continue Ticagrelor, ASA and Atorvastatin for prevention and collaboration with cardiology. High risk family and patient history.       Relevant Orders   Comprehensive metabolic panel   Lipid Panel w/o Chol/HDL Ratio   Essential hypertension  Chronic, ongoing with BP well below goal at this time. Will continue current medication regimen and adjust as needed.  Continue collaboration with cardiology due to her significant family and personal cardiac history.  Recommend she monitor BP at least a few mornings a week at home and document.  DASH diet at home. Labs today: CMP and lipid.  Return in 6 months.       Relevant Orders   Comprehensive metabolic panel   Nonischemic cardiomyopathy (HCC)    Chronic, stable. Continue collaboration with cardiology. Continue current medication regimen and adjust as needed.       Relevant Orders   Comprehensive metabolic panel   Lipid Panel w/o Chol/HDL Ratio   PAD (peripheral artery disease) (HCC)    Chronic, ongoing. Continue collaboration with cardiology.         Respiratory   Moderate COPD (chronic obstructive pulmonary disease) (HCC) - Primary    Chronic, stable. Continue current medication regimen and adjust as needed. No requiring use of Albuterol. Recommend continue cessation of smoking and collaboration with pulmonology.         Endocrine   Hashimoto's disease    Chronic, ongoing. Continue current medication regimen and adjust as needed.         Genitourinary   CKD (chronic kidney disease) stage 3, GFR 30-59 ml/min (HCC)    Chronic, stable. Check CMP today and continue Losartan for kidney  protection. Consider referral to nephrology in the future if needed.      Relevant Orders   Comprehensive metabolic panel     Other   Former smoker    Recommend continued cessation.       History of MI (myocardial infarction)    Continue collaboration of cardiology and current regimen as prescribed by them.       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       Hyperlipidemia LDL goal <70    Chronic, ongoing with history of MI.  Continue statin and adjust dose as needed. Lipid panel today.      Relevant Orders   Comprehensive metabolic panel   Lipid Panel w/o Chol/HDL Ratio     Follow up plan: Return in about 6 months (around 09/12/2022) for Annual physical after 09/12/22.

## 2022-03-13 NOTE — Assessment & Plan Note (Signed)
Chronic, stable. Continue collaboration with cardiology. Continue current medication regimen and adjust as needed.

## 2022-03-13 NOTE — Assessment & Plan Note (Signed)
Removal in 2018 and remains stable.

## 2022-03-13 NOTE — Assessment & Plan Note (Signed)
Continue Ticagrelor, ASA and Atorvastatin for prevention and collaboration with cardiology. High risk family and patient history.

## 2022-03-13 NOTE — Assessment & Plan Note (Signed)
Chronic, ongoing. Continue collaboration with cardiology.

## 2022-03-13 NOTE — Assessment & Plan Note (Signed)
Chronic, ongoing with history of MI.  Continue statin and adjust dose as needed. Lipid panel today.

## 2022-03-13 NOTE — Assessment & Plan Note (Signed)
Chronic, ongoing.  Continue current medication regimen and adjust as needed.   

## 2022-03-13 NOTE — Addendum Note (Signed)
Addended by: Marnee Guarneri T on: 03/13/2022 01:19 PM   Modules accepted: Orders

## 2022-03-13 NOTE — Assessment & Plan Note (Signed)
Recommend continued cessation.

## 2022-03-13 NOTE — Progress Notes (Signed)
I will call her.

## 2022-03-13 NOTE — Assessment & Plan Note (Signed)
Chronic, stable. Check CMP today and continue Losartan for kidney protection. Consider referral to nephrology in the future if needed.

## 2022-03-13 NOTE — Progress Notes (Deleted)
BP 97/65   Pulse 76   Temp 97.9 F (36.6 C) (Oral)   Ht '5\' 6"'$  (1.676 m)   Wt 147 lb 12.8 oz (67 kg)   LMP 11/16/2007 (Approximate)   SpO2 99%   BMI 23.86 kg/m    Subjective:    Patient ID: April Raymond, female    DOB: 08-24-1961, 61 y.o.   MRN: [id number]  HPI: April Raymond is a 61 y.o. female  Chief Complaint  Patient presents with   Hyperlipidemia   Hypertension   Hypothyroidism   Chronic Kidney Disease   HYPERTENSION WITH CAD & HLD Followed by cardiology and last saw 01/15/22, stress-induced cardiomyopathy and angina.    History of MI at age 84.  Has a family history of MI, mother in her 48's and her father's dad passed in his 33's.  Had parathyroid removed in 2018, history of hyperparathyroid.  She continues on Ticagrelor, Metoprolol, Losartan, Lasix daily and PRN (most often taking second pill), and NTG.  Has not had to use any NTG and is beginning to exercise again.  Echo on 10/05/20 noted EF 60-65%. Taking Atorvastatin 80 MG. Satisfied with current treatment? {Blank single:19197::"yes","no"} Duration of hypertension: {Blank single:19197::"chronic","months","years"} BP monitoring frequency: {Blank single:19197::"not checking","rarely","daily","weekly","monthly","a few times a day","a few times a week","a few times a month"} BP range:  BP medication side effects: {Blank single:19197::"yes","no"} Duration of hyperlipidemia: {Blank single:19197::"chronic","months","years"} Cholesterol medication side effects: {Blank single:19197::"yes","no"} Cholesterol supplements: {Blank multiple:19196::"none","fish oil","niacin","red yeast rice"} Medication compliance: {Blank single:19197::"excellent compliance","good compliance","fair compliance","poor compliance"} Aspirin: {Blank single:19197::"yes","no"} Recent stressors: {Blank single:19197::"yes","no"} Recurrent headaches: {Blank single:19197::"yes","no"} Visual changes: {Blank single:19197::"yes","no"} Palpitations: {Blank  single:19197::"yes","no"} Dyspnea: {Blank single:19197::"yes","no"} Chest pain: {Blank single:19197::"yes","no"} Lower extremity edema: {Blank single:19197::"yes","no"} Dizzy/lightheaded: {Blank single:19197::"yes","no"}  The ASCVD Risk score (Arnett DK, et al., 2019) failed to calculate for the following reasons:   The patient has a prior MI or stroke diagnosis   CHRONIC KIDNEY DISEASE CKD status: {Blank single:19197::"controlled","uncontrolled","better","worse","exacerbated","stable"} Medications renally dose: {Blank single:19197::"yes","no"} Previous renal evaluation: {Blank single:19197::"yes","no"} Pneumovax:  {Blank single:19197::"Up to Date","Not up to Date","unknown"} Influenza Vaccine:  {Blank single:19197::"Up to Date","Not up to Date","unknown"}   HYPOTHYROIDISM Continues on Levothyroxine 75 MCG daily.   Thyroid control status:{Blank single:19197::"controlled","uncontrolled","better","worse","exacerbated","stable"} Satisfied with current treatment? {Blank single:19197::"yes","no"} Medication side effects: {Blank single:19197::"yes","no"} Medication compliance: {Blank single:19197::"excellent compliance","good compliance","fair compliance","poor compliance"} Etiology of hypothyroidism:  Recent dose adjustment:{Blank single:19197::"yes","no"} Fatigue: {Blank single:19197::"yes","no"} Cold intolerance: {Blank single:19197::"yes","no"} Heat intolerance: {Blank single:19197::"yes","no"} Weight gain: {Blank single:19197::"yes","no"} Weight loss: {Blank single:19197::"yes","no"} Constipation: {Blank single:19197::"yes","no"} Diarrhea/loose stools: {Blank single:19197::"yes","no"} Palpitations: {Blank single:19197::"yes","no"} Lower extremity edema: {Blank single:19197::"yes","no"} Anxiety/depressed mood: {Blank single:19197::"yes","no"}   COPD History of smoking, quit at 55 -- smoked 1 PPD.    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  Continues Anoro and  has Albuterol to use as needed, but has not needed to use. COPD status: {Blank single:19197::"controlled","uncontrolled","better","worse","exacerbated","stable"} Satisfied with current treatment?: {Blank single:19197::"yes","no"} Oxygen use: {Blank single:19197::"yes","no"} Dyspnea frequency:  Cough frequency:  Rescue inhaler frequency:   Limitation of activity: {Blank single:19197::"yes","no"} Productive cough:  Last Spirometry:  Pneumovax: {Blank single:19197::"Up to Date","Not up to Date","unknown"} Influenza: {Blank single:19197::"Up to Date","Not up to Date","unknown"}   Relevant past medical, surgical, family and social history reviewed and updated as indicated. Interim medical history since our last visit reviewed. Allergies and medications reviewed and updated.  Review of Systems  Constitutional:  Negative for activity change, appetite change, diaphoresis, fatigue and fever.  Respiratory:  Negative for cough, chest tightness, shortness of breath and wheezing.   Cardiovascular:  Negative  for chest pain, palpitations and leg swelling.  Gastrointestinal: Negative.   Endocrine: Negative for cold intolerance and heat intolerance.  Neurological: Negative.   Psychiatric/Behavioral: Negative.      Per HPI unless specifically indicated above     Objective:    BP 97/65   Pulse 76   Temp 97.9 F (36.6 C) (Oral)   Ht '5\' 6"'$  (1.676 m)   Wt 147 lb 12.8 oz (67 kg)   LMP 11/16/2007 (Approximate)   SpO2 99%   BMI 23.86 kg/m   Wt Readings from Last 3 Encounters:  03/13/22 147 lb 12.8 oz (67 kg)  02/19/22 145 lb (65.8 kg)  01/24/22 145 lb (65.8 kg)    Physical Exam Vitals and nursing note reviewed.  Constitutional:      General: She is awake. She is not in acute distress.    Appearance: She is well-developed and well-groomed. She is not ill-appearing.  HENT:     Head: Normocephalic.     Right Ear: Hearing normal.     Left Ear: Hearing normal.  Eyes:     General: Lids are  normal.        Right eye: No discharge.        Left eye: No discharge.     Conjunctiva/sclera: Conjunctivae normal.     Pupils: Pupils are equal, round, and reactive to light.  Cardiovascular:     Rate and Rhythm: Normal rate and regular rhythm.     Heart sounds: Normal heart sounds. No murmur heard.    No gallop.  Pulmonary:     Effort: Pulmonary effort is normal. No accessory muscle usage or respiratory distress.     Breath sounds: Normal breath sounds.  Abdominal:     General: Bowel sounds are normal.     Palpations: Abdomen is soft.  Musculoskeletal:     Cervical back: Normal range of motion and neck supple.     Right lower leg: No edema.     Left lower leg: No edema.  Skin:    General: Skin is warm and dry.  Neurological:     Mental Status: She is alert and oriented to person, place, and time.  Psychiatric:        Attention and Perception: Attention normal.        Mood and Affect: Mood normal.        Speech: Speech normal.        Behavior: Behavior normal. Behavior is cooperative.        Thought Content: Thought content normal.    Results for orders placed or performed during the hospital encounter of 11/23/21  COVID-19, Flu A+B and RSV ([facility])  Result Value Ref Range   SARS-CoV-2, NAA Not Detected Not Detected   Influenza A, NAA Not Detected Not Detected   Influenza B, NAA Not Detected Not Detected   RSV, NAA Not Detected Not Detected   Test Information: Comment   POCT rapid strep A  Result Value Ref Range   Rapid Strep A Screen Negative Negative      Assessment & Plan:   Problem List Items Addressed This Visit       Cardiovascular and Mediastinum   Coronary artery disease of native artery of native heart with stable angina pectoris (HCC)   Relevant Orders   Comprehensive metabolic panel   Lipid Panel w/o Chol/HDL Ratio   Essential hypertension   Relevant Orders   Comprehensive metabolic panel   Nonischemic cardiomyopathy ([place])   Relevant Orders  Comprehensive metabolic panel   Lipid Panel w/o Chol/HDL Ratio     Respiratory   Moderate COPD (chronic obstructive pulmonary disease) (HCC) - Primary     Endocrine   Hashimoto's disease     Genitourinary   CKD (chronic kidney disease) stage 3, GFR 30-59 ml/min (HCC)   Relevant Orders   Comprehensive metabolic panel     Other   Hyperlipidemia LDL goal <70   Relevant Orders   Comprehensive metabolic panel   Lipid Panel w/o Chol/HDL Ratio     Follow up plan: No follow-ups on file.

## 2022-03-14 LAB — COMPREHENSIVE METABOLIC PANEL
ALT: 20 IU/L (ref 0–32)
AST: 21 IU/L (ref 0–40)
Albumin/Globulin Ratio: 2.2 (ref 1.2–2.2)
Albumin: 4.9 g/dL — ABNORMAL HIGH (ref 3.8–4.8)
Alkaline Phosphatase: 56 IU/L (ref 44–121)
BUN/Creatinine Ratio: 21 (ref 12–28)
BUN: 21 mg/dL (ref 8–27)
Bilirubin Total: 0.8 mg/dL (ref 0.0–1.2)
CO2: 25 mmol/L (ref 20–29)
Calcium: 10 mg/dL (ref 8.7–10.3)
Chloride: 102 mmol/L (ref 96–106)
Creatinine, Ser: 1 mg/dL (ref 0.57–1.00)
Globulin, Total: 2.2 g/dL (ref 1.5–4.5)
Glucose: 95 mg/dL (ref 70–99)
Potassium: 4.1 mmol/L (ref 3.5–5.2)
Sodium: 144 mmol/L (ref 134–144)
Total Protein: 7.1 g/dL (ref 6.0–8.5)
eGFR: 64 mL/min/{1.73_m2} (ref >59)

## 2022-03-14 LAB — LIPID PANEL W/O CHOL/HDL RATIO
Cholesterol, Total: 146 mg/dL (ref 100–199)
HDL: 70 mg/dL (ref >39)
LDL Chol Calc (NIH): 58 mg/dL (ref 0–99)
Triglycerides: 101 mg/dL (ref 0–149)
VLDL Cholesterol Cal: 18 mg/dL (ref 5–40)

## 2022-03-14 NOTE — Progress Notes (Signed)
Contacted via [facility] afternoon Jenniffer, your labs have returned and everything remains nice and stable!!  [facility]!! Keep being amazing!!  Thank you for allowing me to participate in your care.  I appreciate you. Kindest regards, Vencent Hauschild

## 2022-03-18 ENCOUNTER — Other Ambulatory Visit: Payer: Self-pay | Admitting: Nurse Practitioner

## 2022-03-18 DIAGNOSIS — N63 Unspecified lump in unspecified breast: Secondary | ICD-10-CM

## 2022-03-18 DIAGNOSIS — R928 Other abnormal and inconclusive findings on diagnostic imaging of breast: Secondary | ICD-10-CM

## 2022-03-18 DIAGNOSIS — Z1231 Encounter for screening mammogram for malignant neoplasm of breast: Secondary | ICD-10-CM

## 2022-03-19 ENCOUNTER — Other Ambulatory Visit: Payer: Self-pay | Admitting: Cardiovascular Disease

## 2022-03-23 ENCOUNTER — Encounter: Payer: Self-pay | Admitting: Nurse Practitioner

## 2022-03-25 MED ORDER — NITROGLYCERIN 0.4 MG SL SUBL
0.4000 mg | SUBLINGUAL_TABLET | SUBLINGUAL | 3 refills | Status: DC | PRN
Start: 2022-03-25 — End: 2024-04-20

## 2022-04-11 NOTE — Anesthesia Preprocedure Evaluation (Addendum)
Anesthesia Evaluation  Patient identified by MRN, date of birth, ID band Patient awake    Reviewed: Allergy & Precautions, NPO status , Patient's Chart, lab work & pertinent test results  History of Anesthesia Complications Negative for: history of anesthetic complications  Airway Mallampati: IV   Neck ROM: Full    Dental   Bridge :   Pulmonary COPD, former smoker (quit 2017),    Pulmonary exam normal breath sounds clear to auscultation       Cardiovascular hypertension, + CAD (s/p MI and stents on Brilinta) and + Peripheral Vascular Disease  Normal cardiovascular exam Rhythm:Regular Rate:Normal     Neuro/Psych Chronic back pain    GI/Hepatic GERD  ,  Endo/Other  Hypothyroidism   Renal/GU negative Renal ROS     Musculoskeletal   Abdominal   Peds  Hematology  (+) Blood dyscrasia, anemia ,   Anesthesia Other Findings   Reproductive/Obstetrics                            Anesthesia Physical Anesthesia Plan  ASA: 3  Anesthesia Plan: MAC   Post-op Pain Management:    Induction: Intravenous  PONV Risk Score and Plan: 2 and TIVA, Midazolam and Treatment may vary due to age or medical condition  Airway Management Planned: Natural Airway  Additional Equipment:   Intra-op Plan:   Post-operative Plan:   Informed Consent: I have reviewed the patients History and Physical, chart, labs and discussed the procedure including the risks, benefits and alternatives for the proposed anesthesia with the patient or authorized representative who has indicated his/her understanding and acceptance.       Plan Discussed with: CRNA  Anesthesia Plan Comments: (LMA/GETA backup discussed.  Patient consented for risks of anesthesia including but not limited to:  - adverse reactions to medications - damage to eyes, teeth, lips or other oral mucosa - nerve damage due to positioning  - sore throat or  hoarseness - damage to heart, brain, nerves, lungs, other parts of body or loss of life  Informed patient about role of CRNA in peri- and intra-operative care.  Patient voiced understanding.)       Anesthesia Quick Evaluation

## 2022-04-17 NOTE — Discharge Instructions (Signed)

## 2022-04-18 ENCOUNTER — Other Ambulatory Visit: Payer: Self-pay | Admitting: Pulmonary Disease

## 2022-04-18 ENCOUNTER — Encounter: Payer: Self-pay | Admitting: Ophthalmology

## 2022-04-22 ENCOUNTER — Ambulatory Visit: Payer: BC Managed Care – PPO | Admitting: Anesthesiology

## 2022-04-22 ENCOUNTER — Ambulatory Visit
Admission: RE | Admit: 2022-04-22 | Discharge: 2022-04-22 | Disposition: A | Discharge status: Home / Self Care | Payer: BC Managed Care – PPO | Attending: Ophthalmology | Admitting: Ophthalmology

## 2022-04-22 ENCOUNTER — Ambulatory Visit: Payer: No Typology Code available for payment source | Admitting: Pulmonary Disease

## 2022-04-22 ENCOUNTER — Ambulatory Visit (AMBULATORY_SURGERY_CENTER): Payer: BC Managed Care – PPO | Admitting: Anesthesiology

## 2022-04-22 ENCOUNTER — Encounter
Admission: RE | Disposition: A | Discharge status: Home / Self Care | Payer: Self-pay | Source: Home / Self Care | Attending: Ophthalmology

## 2022-04-22 ENCOUNTER — Other Ambulatory Visit: Payer: Self-pay

## 2022-04-22 DIAGNOSIS — E039 Hypothyroidism, unspecified: Secondary | ICD-10-CM | POA: Insufficient documentation

## 2022-04-22 DIAGNOSIS — I10 Essential (primary) hypertension: Secondary | ICD-10-CM | POA: Insufficient documentation

## 2022-04-22 DIAGNOSIS — I2510 Atherosclerotic heart disease of native coronary artery without angina pectoris: Secondary | ICD-10-CM | POA: Diagnosis not present

## 2022-04-22 DIAGNOSIS — I739 Peripheral vascular disease, unspecified: Secondary | ICD-10-CM | POA: Diagnosis not present

## 2022-04-22 DIAGNOSIS — G8929 Other chronic pain: Secondary | ICD-10-CM | POA: Insufficient documentation

## 2022-04-22 DIAGNOSIS — I252 Old myocardial infarction: Secondary | ICD-10-CM | POA: Insufficient documentation

## 2022-04-22 DIAGNOSIS — Z955 Presence of coronary angioplasty implant and graft: Secondary | ICD-10-CM | POA: Diagnosis not present

## 2022-04-22 DIAGNOSIS — M549 Dorsalgia, unspecified: Secondary | ICD-10-CM | POA: Insufficient documentation

## 2022-04-22 DIAGNOSIS — J449 Chronic obstructive pulmonary disease, unspecified: Secondary | ICD-10-CM | POA: Diagnosis not present

## 2022-04-22 DIAGNOSIS — Z87891 Personal history of nicotine dependence: Secondary | ICD-10-CM | POA: Diagnosis not present

## 2022-04-22 DIAGNOSIS — H2512 Age-related nuclear cataract, left eye: Secondary | ICD-10-CM | POA: Diagnosis present

## 2022-04-22 DIAGNOSIS — K219 Gastro-esophageal reflux disease without esophagitis: Secondary | ICD-10-CM | POA: Insufficient documentation

## 2022-04-22 HISTORY — DX: Presence of external hearing-aid: Z97.4

## 2022-04-22 HISTORY — DX: Presence of spectacles and contact lenses: Z97.3

## 2022-04-22 HISTORY — DX: Presence of dental prosthetic device (complete) (partial): Z97.2

## 2022-04-22 HISTORY — PX: CATARACT EXTRACTION W/PHACO: SHX586

## 2022-04-22 HISTORY — DX: Raynaud's syndrome without gangrene: I73.00

## 2022-04-22 HISTORY — DX: Cerebral infarction, unspecified: I63.9

## 2022-04-22 SURGERY — PHACOEMULSIFICATION, CATARACT, WITH IOL INSERTION
Anesthesia: Monitor Anesthesia Care | Site: Eye | Laterality: Left

## 2022-04-22 MED ORDER — LACTATED RINGERS IV SOLN: INTRAVENOUS | Status: DC

## 2022-04-22 MED ORDER — FENTANYL CITRATE (PF) 100 MCG/2ML IJ SOLN
INTRAMUSCULAR | Status: DC | PRN
  Administered 2022-04-22: 50 ug via INTRAVENOUS

## 2022-04-22 MED ORDER — SIGHTPATH DOSE#1 NA HYALUR & NA CHOND-NA HYALUR IO KIT
PACK | INTRAOCULAR | Status: DC | PRN
  Administered 2022-04-22: 1 via OPHTHALMIC

## 2022-04-22 MED ORDER — MOXIFLOXACIN HCL 0.5 % OP SOLN
OPHTHALMIC | Status: DC | PRN
  Administered 2022-04-22: 0.2 mL via OPHTHALMIC

## 2022-04-22 MED ORDER — SIGHTPATH DOSE#1 BSS IO SOLN
INTRAOCULAR | Status: DC | PRN
  Administered 2022-04-22: 15 mL

## 2022-04-22 MED ORDER — MIDAZOLAM HCL 2 MG/2ML IJ SOLN
INTRAMUSCULAR | Status: DC | PRN
  Administered 2022-04-22: 1 mg via INTRAVENOUS

## 2022-04-22 MED ORDER — LIDOCAINE HCL (PF) 2 % IJ SOLN
INTRAOCULAR | Status: DC | PRN
  Administered 2022-04-22: 1 mL via INTRAOCULAR

## 2022-04-22 MED ORDER — ACETAMINOPHEN 160 MG/5ML PO SOLN: 325.0000 mg | ORAL | Status: DC | PRN

## 2022-04-22 MED ORDER — ARMC OPHTHALMIC DILATING DROPS
1.0000 | OPHTHALMIC | Status: DC | PRN
  Administered 2022-04-22 (×3): 1 via OPHTHALMIC

## 2022-04-22 MED ORDER — ACETAMINOPHEN 325 MG PO TABS: 650.0000 mg | ORAL_TABLET | Freq: Once | ORAL | Status: DC | PRN

## 2022-04-22 MED ORDER — TETRACAINE HCL 0.5 % OP SOLN
1.0000 [drp] | OPHTHALMIC | Status: DC | PRN
  Administered 2022-04-22 (×3): 1 [drp] via OPHTHALMIC

## 2022-04-22 MED ORDER — SIGHTPATH DOSE#1 BSS IO SOLN
INTRAOCULAR | Status: DC | PRN
  Administered 2022-04-22: 84 mL via OPHTHALMIC

## 2022-04-22 MED ORDER — ONDANSETRON HCL 4 MG/2ML IJ SOLN: 4.0000 mg | Freq: Once | INTRAMUSCULAR | Status: DC | PRN

## 2022-04-22 SURGICAL SUPPLY — 14 items
CATARACT SUITE SIGHTPATH (MISCELLANEOUS) ×2 IMPLANT
DISSECTOR HYDRO NUCLEUS 50X22 (MISCELLANEOUS) ×2 IMPLANT
FEE CATARACT SUITE SIGHTPATH (MISCELLANEOUS) ×1 IMPLANT
GLOVE SURG GAMMEX PI TX LF 7.5 (GLOVE) ×2 IMPLANT
GLOVE SURG SYN 8.5  E (GLOVE) ×1
GLOVE SURG SYN 8.5 E (GLOVE) ×1 IMPLANT
GLOVE SURG SYN 8.5 PF PI (GLOVE) ×1 IMPLANT
LENS IOL TECNIS EYHANCE 16.0 (Intraocular Lens) ×1 IMPLANT
NDL FILTER BLUNT 18X1 1/2 (NEEDLE) ×1 IMPLANT
NEEDLE FILTER BLUNT 18X 1/2SAF (NEEDLE) ×1
NEEDLE FILTER BLUNT 18X1 1/2 (NEEDLE) ×1 IMPLANT
SYR 3ML LL SCALE MARK (SYRINGE) ×2 IMPLANT
SYR 5ML LL (SYRINGE) ×2 IMPLANT
WATER STERILE IRR 250ML POUR (IV SOLUTION) ×2 IMPLANT

## 2022-04-22 NOTE — H&P (Signed)
[facility]   Primary Care Physician:  Venita Lick, NP Ophthalmologist: Dr. Benay Pillow  Pre-Procedure History & Physical: HPI:  April Raymond is a 61 y.o. female here for cataract surgery.   Past Medical History:  Diagnosis Date   Alpha-1-antitrypsin deficiency carrier 02/20/2021   MZ phenotype   Anginal pain ([place])    CAD (coronary artery disease)    a. 2017 s/p prior MI & PCI RCA ([place]); b. 05/2020 Abnl ETT: 1.5-52m horizontal ST dep in inflat leads @ peak stress w/ freq PVCs/couplets/bigeminy. HTN response; b. 05/2020 NSTEMI/Cath: LM nl, LAD 436mLCX 20, RCA 35ost, patent prev placed prox-dist stent. EF 25-35% w/ apical ballooning.   COPD (chronic obstructive pulmonary disease) ([place]   Dental bridge present    Permanent lower   Hashimoto's disease    Hidradenitis suppurativa    Hydradenitis    Hyperparathyroidism ([place]   Hypertension    [place]   Pericardial effusion    a. 05/2020 Echo: sm to mod circumferential peric eff w/o tamponade; b. 07/2020 Mod peric effusion; c. 09/2020 Echo: EF 60-65%, no rwma, small tomod effusion w/o compromise.   Raynaud's syndrome    [facility]   Takotsubo cardiomyopathy    a. 05/2020 Echo: EF 35-40%, glob HK, though basal wall motion best preserved consistent w/ stress induced CM. Gr1 DD. Nl RV size/fxn. Sm to mod circumferential pericardial effusion w/o tamponade.   Wears contact lenses    Wears hearing aid in left ear     Past Surgical History:  Procedure Laterality Date   BREAST BIOPSY Right 05/04/2021   stereo x clip BENIGN BREAST TISSUE WITH MILD STROMAL FIBROSIS AND HEMORRHAGE   BREAST CYST ASPIRATION Right 05/01/2021   BREAST EXCISIONAL BIOPSY Left    age 48's   CARDIAC CATHETERIZATION     CHEST WALL RECONSTRUCTION Right 05/09/2021   Procedure: CHEST WALL MASS RESECTION;  Surgeon: LiLajuana MatteMD;  [facility];  Laterality: Right;  posterior   CYST  EXCISION  x2   LEFT HEART CATH AND CORONARY ANGIOGRAPHY N/A 05/31/2020   Procedure: LEFT HEART CATH AND CORONARY ANGIOGRAPHY;  Surgeon: EnNelva BushMD;  [facility];  Service: Cardiovascular;  Laterality: N/A;   LIPOMA EXCISION     PARATHYROIDECTOMY     TONSILLECTOMY      Prior to Admission medications   Medication Sig Start Date End Date Taking? Authorizing Provider  acetaminophen (TYLENOL) 500 MG tablet Take 1,000 mg by mouth daily as needed.   Yes [provider]  ANCeledonio Miyamoto2.5-25 MCG/ACT AEPB TAKE 1 PUFF BY MOUTH EVERY DAY 02/26/22  Yes GoTyler PitaMD  atorvastatin (LIPITOR) 80 MG tablet Take 1 tablet (80 mg total) by mouth daily. 09/12/21  Yes Cannady, Jolene T, NP  cetirizine (ZYRTEC) 10 MG tablet Take 10 mg by mouth daily.   Yes [provider]  diphenhydrAMINE (BENADRYL) 25 MG tablet Take 25 mg by mouth at bedtime.   Yes [provider]  fluticasone (FLONASE) 50 MCG/ACT nasal spray Place 2 sprays into both nostrils daily.   Yes [provider]  furosemide (LASIX) 20 MG tablet Take 1 tablet (20 mg total) by mouth daily as needed. 03/20/22  Yes End, ChHarrell GaveMD  levothyroxine (SYNTHROID) 75 MCG tablet TAKE 1 TABLET BY MOUTH EVERY DAY BEFORE BREAKFAST 12/24/21  Yes Cannady, Jolene T, NP  losartan (COZAAR) 25 MG tablet Take 0.5  tablets (12.5 mg total) by mouth daily. 09/12/21  Yes Cannady, Jolene T, NP  magnesium oxide (MAG-OX) 400 MG tablet Take 400 mg by mouth daily.   Yes [provider]  metoprolol succinate (TOPROL-XL) 25 MG 24 hr tablet Take 0.5 tablets (12.5 mg total) by mouth daily. 09/12/21  Yes Cannady, Jolene T, NP  Multiple Vitamins-Minerals (CENTRUM SILVER 50+WOMEN PO) Take 1 tablet by mouth daily at 6 (six) AM.   Yes [provider]  nitroGLYCERIN (NITROSTAT) 0.4 MG SL tablet Place 1 tablet (0.4 mg total) under the tongue every 5 (five) minutes as needed for chest pain. 03/25/22 06/23/22  Yes Cannady, Jolene T, NP  pantoprazole (PROTONIX) 40 MG tablet Take 1 tablet (40 mg total) by mouth daily. 09/12/21  Yes Cannady, Jolene T, NP  PREMARIN vaginal cream PLACE 0.5 GRAMS TWICE WEEKLY INTRAVAGINALLY. 05/14/21  Yes Cannady, Jolene T, NP  PROAIR RESPICLICK 502 (90 Base) MCG/ACT AEPB INHALE 2 PUFFS INTO THE LUNGS EVERY 6 (SIX) HOURS AS NEEDED (SOB/WHEEZING). 04/18/22  Yes Tyler Pita, MD  ticagrelor (BRILINTA) 60 MG TABS tablet TAKE 1 TABLET (60 MG TOTAL) BY MOUTH 2 (TWO) TIMES DAILY. 09/12/21  Yes Cannady, Henrine Screws T, NP  Zoster Vaccine Adjuvanted [facility]) injection Inject 0.5 mLs into the muscle once for 1 dose. Dose # 2 05/08/22 05/08/22 Yes Cannady, Jolene T, NP  cholecalciferol (VITAMIN D3) 25 MCG (1000 UNIT) tablet Take 1,000 Units by mouth daily. Patient not taking: Reported on 04/18/2022    [provider]    Allergies as of 02/25/2022 - Review Complete 02/19/2022  Allergen Reaction Noted   Azithromycin Rash 12/26/2021   Humira [adalimumab] Rash 09/30/2019   Tape Rash 05/09/2021    Family History  Problem Relation Age of Onset   Heart attack Mother 24   COPD Mother    Stroke Mother    Diverticulitis Mother    Cancer Mother        breast   Hypertension Mother    Breast cancer Mother 60   Hypertension Father    Stroke Maternal Grandfather    Alzheimer's disease Paternal Grandmother    Heart attack Paternal Grandfather    Breast cancer Cousin        mat cousin x 2   Hypertension Brother     Social History   Socioeconomic History   Marital status: Married    Spouse name: Not on file   Number of children: Not on file   Years of education: Not on file   Highest education level: Not on file  Occupational History   Not on file  Tobacco Use   Smoking status: Former    Packs/day: 1.00    Years: 40.00    Total pack years: 40.00    Types: Cigarettes    Quit date: 09/18/2015    Years since quitting: 6.5   Smokeless tobacco: Never  Vaping Use    Vaping Use: Never used  Substance and Sexual Activity   Alcohol use: Yes    Alcohol/week: 6.0 standard drinks of alcohol    Types: 6 Standard drinks or equivalent per week    Comment: 2-3 glasses wine, 2-3x/wk   Drug use: Never   Sexual activity: Not on file  Other Topics Concern   Not on file  Social History Narrative   Not on file   Social Determinants of Health   Financial Resource Strain: Low Risk  (09/12/2021)   Overall Financial Resource Strain (CARDIA)    Difficulty of Paying  Living Expenses: Not hard at all  Food Insecurity: No Food Insecurity (09/12/2021)   Hunger Vital Sign    Worried About Running Out of Food in the Last Year: Never true    Ran Out of Food in the Last Year: Never true  Transportation Needs: No Transportation Needs (09/12/2021)   PRAPARE - Hydrologist (Medical): No    Lack of Transportation (Non-Medical): No  Physical Activity: Sufficiently Active (09/12/2021)   Exercise Vital Sign    Days of Exercise per Week: 4 days    Minutes of Exercise per Session: 40 min  Stress: No Stress Concern Present (09/12/2021)   [place]    Feeling of Stress : Only a little  Social Connections: Moderately Integrated (09/12/2021)   Social Connection and Isolation Panel [NHANES]    Frequency of Communication with Friends and Family: More than three times a week    Frequency of Social Gatherings with Friends and Family: More than three times a week    Attends Religious Services: More than 4 times per year    Active Member of [employer] or Organizations: No    Attends Archivist Meetings: Never    Marital Status: Married  Human resources officer Violence: Not At Risk (09/12/2021)   Humiliation, Afraid, Rape, and Kick questionnaire    Fear of Current or Ex-Partner: No    Emotionally Abused: No    Physically Abused: No    Sexually Abused: No    Review of Systems: See HPI,  otherwise negative ROS  Physical Exam: BP 138/67   Pulse 68   Temp (!) 97.4 F (36.3 C) (Temporal)   Resp 20   Ht '5\' 6"'$  (1.676 m)   Wt 67.6 kg   LMP 11/16/2007 (Approximate)   SpO2 98%   BMI 24.05 kg/m  General:   Alert, cooperative in NAD Head:  Normocephalic and atraumatic. Respiratory:  Normal work of breathing. Cardiovascular:  RRR  Impression/Plan: Raynaldo Opitz is here for cataract surgery.  Risks, benefits, limitations, and alternatives regarding cataract surgery have been reviewed with the patient.  Questions have been answered.  All parties agreeable.   Benay Pillow, MD  04/22/2022, 11:18 AM

## 2022-04-22 NOTE — Transfer of Care (Signed)
Immediate Anesthesia Transfer of Care Note  Patient: April Raymond  Procedure(s) Performed: CATARACT EXTRACTION PHACO AND INTRAOCULAR LENS PLACEMENT (IOC) LEFT (Left: Eye)  Patient Location: PACU  Anesthesia Type:MAC  Level of Consciousness: awake, alert  and oriented  Airway & Oxygen Therapy: Patient Spontanous Breathing  Post-op Assessment: Report given to RN and Post -op Vital signs reviewed and stable  Post vital signs: Reviewed and stable  Last Vitals:  Vitals Value Taken Time  BP 113/69 04/22/22 1149  Temp 36.2 C 04/22/22 1149  Pulse 60 04/22/22 1151  Resp 16 04/22/22 1151  SpO2 97 % 04/22/22 1151  Vitals shown include unvalidated device data.  Last Pain:  Vitals:   04/22/22 1149  TempSrc:   PainSc: 0-No pain         Complications: No notable events documented.

## 2022-04-22 NOTE — Anesthesia Postprocedure Evaluation (Signed)
Anesthesia Post Note  Patient: April Raymond  Procedure(s) Performed: CATARACT EXTRACTION PHACO AND INTRAOCULAR LENS PLACEMENT (IOC) LEFT (Left: Eye)     Patient location during evaluation: PACU Anesthesia Type: MAC Level of consciousness: awake and alert, oriented and patient cooperative Pain management: pain level controlled Vital Signs Assessment: post-procedure vital signs reviewed and stable Respiratory status: spontaneous breathing, nonlabored ventilation and respiratory function stable Cardiovascular status: blood pressure returned to baseline and stable Postop Assessment: adequate PO intake Anesthetic complications: no   No notable events documented.  Darrin Nipper

## 2022-04-22 NOTE — Op Note (Signed)
OPERATIVE NOTE  April Raymond [id number] 04/22/2022   PREOPERATIVE DIAGNOSIS:  Nuclear sclerotic cataract left eye.  H25.12   POSTOPERATIVE DIAGNOSIS:    Nuclear sclerotic cataract left eye.     PROCEDURE:  Phacoemusification with posterior chamber intraocular lens placement of the left eye   LENS:   Implant Name Type Inv. Item Serial No. Manufacturer Lot No. LRB No. Used Action  LENS IOL TECNIS EYHANCE 16.0 - [id number] Intraocular Lens LENS IOL TECNIS EYHANCE 16.0 [id number] [employer]  Left 1 Implanted      Procedure(s) with comments: CATARACT EXTRACTION PHACO AND INTRAOCULAR LENS PLACEMENT (IOC) LEFT (Left) - 1.42 00:19.9  DIB00 +16.0   ULTRASOUND TIME: 0 minutes 19 seconds.  CDE 1..42   SURGEON:  Benay Pillow, MD, MPH   ANESTHESIA:  Topical with tetracaine drops augmented with 1% preservative-free intracameral lidocaine.  ESTIMATED BLOOD LOSS: <1 mL   COMPLICATIONS:  None.   DESCRIPTION OF PROCEDURE:  The patient was identified in the holding room and transported to the operating room and placed in the supine position under the operating microscope.  The left eye was identified as the operative eye and it was prepped and draped in the usual sterile ophthalmic fashion.   A 1.0 millimeter clear-corneal paracentesis was made at the 5:00 position. 0.5 ml of preservative-free 1% lidocaine with epinephrine was injected into the anterior chamber.  The anterior chamber was filled with viscoelastic.  A 2.4 millimeter keratome was used to make a near-clear corneal incision at the 2:00 position.  A curvilinear capsulorrhexis was made with a cystotome and capsulorrhexis forceps.  Balanced salt solution was used to hydrodissect and hydrodelineate the nucleus.   Phacoemulsification was then used in stop and chop fashion to remove the lens nucleus and epinucleus.  The remaining cortex was then removed using the irrigation and aspiration handpiece. Viscoelastic was then placed into the  capsular bag to distend it for lens placement.  A lens was then injected into the capsular bag.  The remaining viscoelastic was aspirated.   Wounds were hydrated with balanced salt solution.  The anterior chamber was inflated to a physiologic pressure with balanced salt solution.  Intracameral vigamox 0.1 mL undiltued was injected into the eye and a drop placed onto the ocular surface.  No wound leaks were noted.  The patient was taken to the recovery room in stable condition without complications of anesthesia or surgery  Benay Pillow 04/22/2022, 11:47 AM

## 2022-04-23 ENCOUNTER — Other Ambulatory Visit: Payer: Self-pay

## 2022-04-23 ENCOUNTER — Encounter: Payer: Self-pay | Admitting: Ophthalmology

## 2022-04-25 ENCOUNTER — Ambulatory Visit: Payer: No Typology Code available for payment source | Admitting: Dermatology

## 2022-04-25 DIAGNOSIS — D1800 Hemangioma unspecified site: Secondary | ICD-10-CM

## 2022-04-25 DIAGNOSIS — D2271 Melanocytic nevi of right lower limb, including hip: Secondary | ICD-10-CM | POA: Diagnosis not present

## 2022-04-25 DIAGNOSIS — L578 Other skin changes due to chronic exposure to nonionizing radiation: Secondary | ICD-10-CM

## 2022-04-25 DIAGNOSIS — L814 Other melanin hyperpigmentation: Secondary | ICD-10-CM

## 2022-04-25 DIAGNOSIS — Z1283 Encounter for screening for malignant neoplasm of skin: Secondary | ICD-10-CM | POA: Diagnosis not present

## 2022-04-25 DIAGNOSIS — L821 Other seborrheic keratosis: Secondary | ICD-10-CM

## 2022-04-25 DIAGNOSIS — D229 Melanocytic nevi, unspecified: Secondary | ICD-10-CM

## 2022-04-25 NOTE — Progress Notes (Signed)
   Follow-Up Visit   Subjective  April Raymond is a 61 y.o. female who presents for the following: Annual Exam (Tbse. Reports family history of skin cancer in mother but is not sure what kind. ).   The patient presents for Total-Body Skin Exam (TBSE) for skin cancer screening and mole check.  The patient has spots, moles and lesions to be evaluated, some may be new or changing and the patient has concerns that these could be cancer.   The following portions of the chart were reviewed this encounter and updated as appropriate:  Tobacco  Allergies  Meds  Problems  Med Hx  Surg Hx  Fam Hx      Review of Systems: No other skin or systemic complaints except as noted in HPI or Assessment and Plan.   Objective  Well appearing patient in no apparent distress; mood and affect are within normal limits.  A full examination was performed including scalp, head, eyes, ears, nose, lips, neck, chest, axillae, abdomen, back, buttocks, bilateral upper extremities, bilateral lower extremities, hands, feet, fingers, toes, fingernails, and toenails. All findings within normal limits unless otherwise noted below.  right 3rd toe 0.5cm medium brown macule   Assessment & Plan  Nevus right 3rd toe  Benign-appearing.  Observation.  Call clinic for new or changing lesions.  Recommend daily use of broad spectrum spf 30+ sunscreen to sun-exposed areas.    Lentigines - Scattered tan macules - Due to sun exposure - Benign-appearing, observe - Recommend daily broad spectrum sunscreen SPF 30+ to sun-exposed areas, reapply every 2 hours as needed. - Call for any changes  Seborrheic Keratoses - Stuck-on, waxy, tan-brown papules and/or plaques  - Benign-appearing - Discussed benign etiology and prognosis. - Observe - Call for any changes  Melanocytic Nevi - Tan-brown and/or pink-flesh-colored symmetric macules and papules - Benign appearing on exam today - Observation - Call clinic for new or  changing moles - Recommend daily use of broad spectrum spf 30+ sunscreen to sun-exposed areas.   Hemangiomas - Red papules - Discussed benign nature - Observe - Call for any changes  Actinic Damage - Chronic condition, secondary to cumulative UV/sun exposure - diffuse scaly erythematous macules with underlying dyspigmentation - Recommend daily broad spectrum sunscreen SPF 30+ to sun-exposed areas, reapply every 2 hours as needed.  - Staying in the shade or wearing long sleeves, sun glasses (UVA+UVB protection) and wide brim hats (4-inch brim around the entire circumference of the hat) are also recommended for sun protection.  - Call for new or changing lesions.  Skin cancer screening performed today. Return for 1 - 3 year tbse . I, Ruthell Rummage, CMA, am acting as scribe for Forest Gleason, MD.  Documentation: I have reviewed the above documentation for accuracy and completeness, and I agree with the above.  Forest Gleason, MD

## 2022-04-25 NOTE — Patient Instructions (Addendum)
Recommend taking Heliocare sun protection supplement daily in sunny weather for additional sun protection. For maximum protection on the sunniest days, you can take up to 2 capsules of regular Heliocare OR take 1 capsule of Heliocare Ultra. For prolonged exposure (such as a full day in the sun), you can repeat your dose of the supplement 4 hours after your first dose. Heliocare can be purchased at [facility], at some [facility] or at www.heliocare.com.     Melanoma ABCDEs  Melanoma is the most dangerous type of skin cancer, and is the leading cause of death from skin disease.  You are more likely to develop melanoma if you: Have light-colored skin, light-colored eyes, or red or blond hair Spend a lot of time in the sun Tan regularly, either outdoors or in a tanning bed Have had blistering sunburns, especially during childhood Have a close family member who has had a melanoma Have atypical moles or large birthmarks  Early detection of melanoma is key since treatment is typically straightforward and cure rates are extremely high if we catch it early.   The first sign of melanoma is often a change in a mole or a new dark spot.  The ABCDE system is a way of remembering the signs of melanoma.  A for asymmetry:  The two halves do not match. B for border:  The edges of the growth are irregular. C for color:  A mixture of colors are present instead of an even brown color. D for diameter:  Melanomas are usually (but not always) greater than 6mm - the size of a pencil eraser. E for evolution:  The spot keeps changing in size, shape, and color.  Please check your skin once per month between visits. You can use a small mirror in front and a large mirror behind you to keep an eye on the back side or your body.   If you see any new or changing lesions before your next follow-up, please call to schedule a visit.  Please continue daily skin protection including broad spectrum sunscreen SPF 30+ to  sun-exposed areas, reapplying every 2 hours as needed when you're outdoors.   Staying in the shade or wearing long sleeves, sun glasses (UVA+UVB protection) and wide brim hats (4-inch brim around the entire circumference of the hat) are also recommended for sun protection.        Due to recent changes in healthcare laws, you may see results of your pathology and/or laboratory studies on MyChart before the doctors have had a chance to review them. We understand that in some cases there may be results that are confusing or concerning to you. Please understand that not all results are received at the same time and often the doctors may need to interpret multiple results in order to provide you with the best plan of care or course of treatment. Therefore, we ask that you please give us 2 business days to thoroughly review all your results before contacting the office for clarification. Should we see a critical lab result, you will be contacted sooner.   If You Need Anything After Your Visit  If you have any questions or concerns for your doctor, please call our main line at [phone] and press option 4 to reach your doctor's medical assistant. If no one answers, please leave a voicemail as directed and we will return your call as soon as possible. Messages left after 4 pm will be answered the following business day.   You may   also send us a message via MyChart. We typically respond to MyChart messages within 1-2 business days.  For prescription refills, please ask your pharmacy to contact our office. Our fax number is [phone].  If you have an urgent issue when the clinic is closed that cannot wait until the next business day, you can page your doctor at the number below.    Please note that while we do our best to be available for urgent issues outside of office hours, we are not available 24/7.   If you have an urgent issue and are unable to reach us, you may choose to seek medical care  at your doctor's office, retail clinic, urgent care center, or emergency room.  If you have a medical emergency, please immediately call 911 or go to the emergency department.  Pager Numbers  - Dr. Kowalski: [phone]  - Dr. Moye: [phone]  - Dr. Stewart: [phone]  In the event of inclement weather, please call our main line at [phone] for an update on the status of any delays or closures.  Dermatology Medication Tips: Please keep the boxes that topical medications come in in order to help keep track of the instructions about where and how to use these. Pharmacies typically print the medication instructions only on the boxes and not directly on the medication tubes.   If your medication is too expensive, please contact our office at [phone] option 4 or send us a message through MyChart.   We are unable to tell what your co-pay for medications will be in advance as this is different depending on your insurance coverage. However, we may be able to find a substitute medication at lower cost or fill out paperwork to get insurance to cover a needed medication.   If a prior authorization is required to get your medication covered by your insurance company, please allow us 1-2 business days to complete this process.  Drug prices often vary depending on where the prescription is filled and some pharmacies may offer cheaper prices.  The website www.goodrx.com contains coupons for medications through different pharmacies. The prices here do not account for what the cost may be with help from insurance (it may be cheaper with your insurance), but the website can give you the price if you did not use any insurance.  - You can print the associated coupon and take it with your prescription to the pharmacy.  - You may also stop by our office during regular business hours and pick up a GoodRx coupon card.  - If you need your prescription sent electronically to a different pharmacy,  notify our office through [place] MyChart or by phone at [phone] option 4.     Si Usted Necesita Algo Despus de Su Visita  Tambin puede enviarnos un mensaje a travs de MyChart. Por lo general respondemos a los mensajes de MyChart en el transcurso de 1 a 2 das hbiles.  Para renovar recetas, por favor pida a su farmacia que se ponga en contacto con nuestra oficina. Nuestro nmero de fax es el [phone].  Si tiene un asunto urgente cuando la clnica est cerrada y que no puede esperar hasta el siguiente da hbil, puede llamar/localizar a su doctor(a) al nmero que aparece a continuacin.   Por favor, tenga en cuenta que aunque hacemos todo lo posible para estar disponibles para asuntos urgentes fuera del horario de oficina, no estamos disponibles las 24 horas del da, los 7 das de la semana.   Si   tiene un problema urgente y no puede comunicarse con nosotros, puede optar por buscar atencin mdica  en el consultorio de su doctor(a), en una clnica privada, en un centro de atencin urgente o en una sala de emergencias.  Si tiene una emergencia mdica, por favor llame inmediatamente al 911 o vaya a la sala de emergencias.  Nmeros de bper  - Dr. Kowalski: [phone]  - Dra. Moye: [phone]  - Dra. Stewart: [phone]  En caso de inclemencias del tiempo, por favor llame a nuestra lnea principal al [phone] para una actualizacin sobre el estado de cualquier retraso o cierre.  Consejos para la medicacin en dermatologa: Por favor, guarde las cajas en las que vienen los medicamentos de uso tpico para ayudarle a seguir las instrucciones sobre dnde y cmo usarlos. Las farmacias generalmente imprimen las instrucciones del medicamento slo en las cajas y no directamente en los tubos del medicamento.   Si su medicamento es muy caro, por favor, pngase en contacto con nuestra oficina llamando al [phone] y presione la opcin 4 o envenos un mensaje a travs de  MyChart.   No podemos decirle cul ser su copago por los medicamentos por adelantado ya que esto es diferente dependiendo de la cobertura de su seguro. Sin embargo, es posible que podamos encontrar un medicamento sustituto a menor costo o llenar un formulario para que el seguro cubra el medicamento que se considera necesario.   Si se requiere una autorizacin previa para que su compaa de seguros cubra su medicamento, por favor permtanos de 1 a 2 das hbiles para completar este proceso.  Los precios de los medicamentos varan con frecuencia dependiendo del lugar de dnde se surte la receta y alguna farmacias pueden ofrecer precios ms baratos.  El sitio web www.goodrx.com tiene cupones para medicamentos de diferentes farmacias. Los precios aqu no tienen en cuenta lo que podra costar con la ayuda del seguro (puede ser ms barato con su seguro), pero el sitio web puede darle el precio si no utiliz ningn seguro.  - Puede imprimir el cupn correspondiente y llevarlo con su receta a la farmacia.  - Tambin puede pasar por nuestra oficina durante el horario de atencin regular y recoger una tarjeta de cupones de GoodRx.  - Si necesita que su receta se enve electrnicamente a una farmacia diferente, informe a nuestra oficina a travs de MyChart de [place] o por telfono llamando al [phone] y presione la opcin 4.  

## 2022-04-29 ENCOUNTER — Encounter: Payer: Self-pay | Admitting: Pulmonary Disease

## 2022-04-29 ENCOUNTER — Ambulatory Visit: Payer: No Typology Code available for payment source | Admitting: Pulmonary Disease

## 2022-04-29 ENCOUNTER — Other Ambulatory Visit
Admission: RE | Admit: 2022-04-29 | Discharge: 2022-04-29 | Disposition: A | Discharge status: Home / Self Care | Payer: BLUE CROSS/BLUE SHIELD | Source: Ambulatory Visit | Attending: Pulmonary Disease | Admitting: Pulmonary Disease

## 2022-04-29 VITALS — BP 130/82 | HR 65 | Temp 97.9°F | Ht 66.0 in | Wt 148.0 lb

## 2022-04-29 DIAGNOSIS — E8801 Alpha-1-antitrypsin deficiency: Secondary | ICD-10-CM | POA: Diagnosis present

## 2022-04-29 DIAGNOSIS — R0602 Shortness of breath: Secondary | ICD-10-CM | POA: Diagnosis not present

## 2022-04-29 DIAGNOSIS — J449 Chronic obstructive pulmonary disease, unspecified: Secondary | ICD-10-CM

## 2022-04-29 NOTE — Addendum Note (Signed)
Addended by: Antonieta Iba C on: 04/29/2022 10:35 AM   Modules accepted: Orders

## 2022-04-29 NOTE — Progress Notes (Signed)
Subjective:    Patient ID: April Raymond, female    DOB: [birth year], 61 y.o.   MRN: [id number] Patient Care Team: Venita Lick, NP as PCP - General (Nurse Practitioner) End, Harrell Gave, MD as PCP - Cardiology (Cardiology)  Chief Complaint  Patient presents with   Follow-up    Breathing is overall doing well. No new co's. She has not had to use her rescue inhaler.     HPI April Raymond is a 61 year old former smoker with a 40-pack-year history of smoking, alpha-1 MZ phenotype with mild deficiency and dyspnea who presents for follow-up.  This is a scheduled visit.  She was last seen in June 2022.  For the details of that visit please refer to that note.  She was supposed to follow with the alpha-1 level 6 months after that visit however this was not done.  She has been doing well on Anoro Ellipta which has helped relieve her symptoms of dyspnea.  He does not endorse any fevers, chills or sweats.  No chest pain.  No orthopnea or paroxysmal nocturnal dyspnea.  We counseled her during her last visit with regards to implications of MZ phenotype and mildly decreased alpha-1 levels.  Also discussed the implications with regards to children and grandchildren needed to be screened for the disease.  She is on Brilinta which can of course aggravate the sensation of shortness of breath however with regards to her shortness of breath she is well compensated with the Anoro.  She has not had any cough, sputum production, hemoptysis, weight loss or anorexia.  No lower extremity edema.  No calf tenderness.  Overall she feels well and looks well.  DATA: 10/05/2020 2D echo: Small to moderate size pericardial effusion without hemodynamic compromise, LVEF 60 to 65%.  Indeterminate diastolics.  Normal RV function.  No valvular abnormality 10/17/2020 PFTs: FEV1 1.71 L or 62% predicted, FVC 2.73 L or 76% predicted, FEV1/FVC 62%, no bronchodilator response.  Diffusion capacity mildly reduced.  Consistent with  moderate COPD. 12/04/2020 alpha-1-antitrypsin: Phenotype MZ, level 80 mg/dl.  Review of Systems A 10 point review of systems was performed and it is as noted above otherwise negative.  Patient Active Problem List   Diagnosis Date Noted   Spondylosis without myelopathy or radiculopathy, thoracic region 01/24/2022   Chronic anticoagulation (BRILINTA) 01/17/2022   DDD (degenerative disc disease), thoracic 01/09/2022   Thoracic facet syndrome 01/09/2022   Cervical facet syndrome 01/09/2022   Tinnitus of left ear 12/12/2021   Cervicalgia 11/19/2021   DDD (degenerative disc disease), cervical 11/19/2021   Grade 1 Anterolisthesis of cervical spine (C7/T1) 11/19/2021   Grade 1 Anterolisthesis of thoracic spine (T2/T3) 11/19/2021   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   Chronic pain syndrome 11/17/2021   Chronic upper back pain (1ry area of Pain) (Right) 10/22/2021   Chronic thoracic back pain (Bilateral) (R>L) 10/22/2021   Chronic neck pain (2ry area of Pain) (Bilateral) (R>L) 10/22/2021   Chronic shoulder pain (3ry area of Pain) (Bilateral) 10/22/2021   Painful cervical range of motion 10/22/2021   Impaired range of motion of cervical spine 10/22/2021   Spasm of thoracic back muscle 10/22/2021   CKD (chronic kidney disease) stage 3, GFR 30-59 ml/min (HCC) 09/08/2021   H/O lipoma 05/09/2021   Alpha-1-antitrypsin deficiency carrier 02/20/2021   Moderate COPD (chronic obstructive pulmonary disease) ([place]) 01/12/2021   PAD (peripheral artery disease) ([place]) 09/22/2020   Nonischemic cardiomyopathy ([place]) 05/31/2020   Former smoker 09/30/2019   History of MI (myocardial infarction) 09/30/2019  History of parathyroidectomy ([place]) 09/30/2019   Coronary artery disease of native artery of native heart with stable angina pectoris ([place]) 09/30/2019   Essential hypertension 09/30/2019   Hyperlipidemia LDL goal <70 09/30/2019   Hashimoto's disease 09/30/2019   Vaginal atrophy 09/30/2019    Hidradenitis suppurativa 09/30/2019   Social History   Tobacco Use   Smoking status: Former    Packs/day: 1.00    Years: 40.00    Total pack years: 40.00    Types: Cigarettes    Quit date: 09/18/2015    Years since quitting: 6.6   Smokeless tobacco: Never  Substance Use Topics   Alcohol use: Yes    Alcohol/week: 6.0 standard drinks of alcohol    Types: 6 Standard drinks or equivalent per week    Comment: 2-3 glasses wine, 2-3x/wk   Allergies  Allergen Reactions   Azithromycin Rash   Humira [Adalimumab] Rash   Tape Rash   Current Meds  Medication Sig   acetaminophen (TYLENOL) 500 MG tablet Take 1,000 mg by mouth daily as needed.   ANORO ELLIPTA 62.5-25 MCG/ACT AEPB TAKE 1 PUFF BY MOUTH EVERY DAY   atorvastatin (LIPITOR) 80 MG tablet Take 1 tablet (80 mg total) by mouth daily.   cetirizine (ZYRTEC) 10 MG tablet Take 10 mg by mouth daily.   cholecalciferol (VITAMIN D3) 25 MCG (1000 UNIT) tablet Take 1,000 Units by mouth daily.   diphenhydrAMINE (BENADRYL) 25 MG tablet Take 25 mg by mouth at bedtime.   fluticasone (FLONASE) 50 MCG/ACT nasal spray Place 2 sprays into both nostrils daily.   furosemide (LASIX) 20 MG tablet Take 1 tablet (20 mg total) by mouth daily as needed.   levothyroxine (SYNTHROID) 75 MCG tablet TAKE 1 TABLET BY MOUTH EVERY DAY BEFORE BREAKFAST   losartan (COZAAR) 25 MG tablet Take 0.5 tablets (12.5 mg total) by mouth daily.   magnesium oxide (MAG-OX) 400 MG tablet Take 400 mg by mouth daily.   metoprolol succinate (TOPROL-XL) 25 MG 24 hr tablet Take 0.5 tablets (12.5 mg total) by mouth daily.   Multiple Vitamins-Minerals (CENTRUM SILVER 50+WOMEN PO) Take 1 tablet by mouth daily at 6 (six) AM.   nitroGLYCERIN (NITROSTAT) 0.4 MG SL tablet Place 1 tablet (0.4 mg total) under the tongue every 5 (five) minutes as needed for chest pain.   pantoprazole (PROTONIX) 40 MG tablet Take 1 tablet (40 mg total) by mouth daily.   PREMARIN vaginal cream PLACE 0.5 GRAMS TWICE  WEEKLY INTRAVAGINALLY.   PROAIR RESPICLICK 220 (90 Base) MCG/ACT AEPB INHALE 2 PUFFS INTO THE LUNGS EVERY 6 (SIX) HOURS AS NEEDED (SOB/WHEEZING).   ticagrelor (BRILINTA) 60 MG TABS tablet TAKE 1 TABLET (60 MG TOTAL) BY MOUTH 2 (TWO) TIMES DAILY.   [START ON 05/08/2022] Zoster Vaccine Adjuvanted [facility]) injection Inject 0.5 mLs into the muscle once for 1 dose. Dose # 2   Immunization History  Administered Date(s) Administered   Influenza,inj,Quad PF,6+ Mos 05/19/2020   Influenza-Unspecified 07/19/2019, 06/22/2021   Moderna SARS-COV2 Booster Vaccination 01/12/2021   Moderna Sars-Covid-2 Vaccination 12/16/2019, 01/13/2020, 08/08/2020, 06/22/2021   Tdap 03/21/2020       Objective:   Physical Exam BP 130/82 (BP Location: Left Arm, Cuff Size: Normal)   Pulse 65   Temp 97.9 F (36.6 C) (Oral)   Ht '5\' 6"'$  (1.676 m)   Wt 148 lb (67.1 kg)   LMP 11/16/2007 (Approximate)   SpO2 99% Comment: on RA  BMI 23.89 kg/m  GENERAL: Well-developed, well-nourished woman in no acute distress.  Fully ambulatory.  No conversational dyspnea. HEAD: Normocephalic, atraumatic.  EYES: Pupils equal, round, reactive to light.  No scleral icterus.  MOUTH: Nose/mouth/throat not examined due to masking requirements for COVID 19. NECK: Supple. No thyromegaly. Trachea midline. No JVD.  No adenopathy. PULMONARY: Good air entry bilaterally. No adventitious sounds. CARDIOVASCULAR: S1 and S2. Regular rate and rhythm.  No rubs, murmurs or gallops heard. ABDOMEN: Benign. MUSCULOSKELETAL: No joint deformity, no clubbing, no edema.  NEUROLOGIC: No focal deficit, no gait disturbance, speech is fluent. SKIN: Intact,warm,dry.  No rashes. PSYCH: Mood and behavior normal.     Assessment & Plan:     ICD-10-CM   1. Stage 2 moderate COPD by GOLD classification (HCC)  J44.9 Pulmonary Function Test [facility] Only   Continue Anoro Ellipta 1 inhalation daily Continue as needed albuterol    2. Alpha-1-antitrypsin deficiency ([place])   E88.01 Alpha-1-antitrypsin    Pulmonary Function Test [facility] Only   Recheck alpha-1 level    3. Shortness of breath  R06.02    Patient doing well in this regard Well compensated with Anoro     Orders Placed This Encounter  Procedures   Alpha-1-antitrypsin    Standing Status:   Future    Standing Expiration Date:   04/29/2023   Pulmonary Function Test [facility] Only    Standing Status:   Future    Standing Expiration Date:   04/30/2023    Scheduling Instructions:     Next available please    Order Specific Question:   Full PFT: includes the following: basic spirometry, spirometry pre & post bronchodilator, diffusion capacity (DLCO), lung volumes    Answer:   Full PFT   We will reevaluate alpha-1 levels.  Check PFTs.  Follow-up months time call sooner should any new problems arise.  Will notify patient the results of her testing.  Renold Don, MD Advanced Bronchoscopy PCCM [facility]    *This note was dictated using voice recognition software/Dragon.  Despite best efforts to proofread, errors can occur which can change the meaning. Any transcriptional errors that result from this process are unintentional and may not be fully corrected at the time of dictation.

## 2022-04-29 NOTE — Patient Instructions (Signed)
We are checking your level of alpha-1 to make sure it has not declined.  Check breathing test to make sure your function is staying stable.  Continue Anoro 1 inhalation daily.  We will see you in follow-up in 6 months time call sooner should any new problems arise.

## 2022-04-30 ENCOUNTER — Other Ambulatory Visit: Payer: Self-pay

## 2022-04-30 DIAGNOSIS — E8801 Alpha-1-antitrypsin deficiency: Secondary | ICD-10-CM

## 2022-04-30 LAB — ALPHA-1-ANTITRYPSIN: A-1 Antitrypsin, Ser: 87 mg/dL — ABNORMAL LOW (ref 101–187)

## 2022-05-02 NOTE — Discharge Instructions (Signed)

## 2022-05-06 ENCOUNTER — Other Ambulatory Visit: Payer: Self-pay

## 2022-05-06 ENCOUNTER — Encounter: Payer: Self-pay | Admitting: Ophthalmology

## 2022-05-06 ENCOUNTER — Encounter
Admission: RE | Disposition: A | Discharge status: Home / Self Care | Payer: Self-pay | Source: Home / Self Care | Attending: Ophthalmology

## 2022-05-06 ENCOUNTER — Ambulatory Visit (AMBULATORY_SURGERY_CENTER): Payer: BLUE CROSS/BLUE SHIELD | Admitting: Anesthesiology

## 2022-05-06 ENCOUNTER — Ambulatory Visit: Payer: BLUE CROSS/BLUE SHIELD | Admitting: Anesthesiology

## 2022-05-06 ENCOUNTER — Ambulatory Visit
Admission: RE | Admit: 2022-05-06 | Discharge: 2022-05-06 | Disposition: A | Discharge status: Home / Self Care | Payer: BLUE CROSS/BLUE SHIELD | Attending: Ophthalmology | Admitting: Ophthalmology

## 2022-05-06 DIAGNOSIS — E1136 Type 2 diabetes mellitus with diabetic cataract: Secondary | ICD-10-CM | POA: Diagnosis not present

## 2022-05-06 DIAGNOSIS — H2511 Age-related nuclear cataract, right eye: Secondary | ICD-10-CM | POA: Diagnosis not present

## 2022-05-06 DIAGNOSIS — E1151 Type 2 diabetes mellitus with diabetic peripheral angiopathy without gangrene: Secondary | ICD-10-CM | POA: Diagnosis not present

## 2022-05-06 DIAGNOSIS — I252 Old myocardial infarction: Secondary | ICD-10-CM | POA: Insufficient documentation

## 2022-05-06 DIAGNOSIS — I10 Essential (primary) hypertension: Secondary | ICD-10-CM

## 2022-05-06 DIAGNOSIS — I25119 Atherosclerotic heart disease of native coronary artery with unspecified angina pectoris: Secondary | ICD-10-CM

## 2022-05-06 DIAGNOSIS — Z87891 Personal history of nicotine dependence: Secondary | ICD-10-CM | POA: Diagnosis not present

## 2022-05-06 DIAGNOSIS — I2510 Atherosclerotic heart disease of native coronary artery without angina pectoris: Secondary | ICD-10-CM | POA: Insufficient documentation

## 2022-05-06 DIAGNOSIS — J449 Chronic obstructive pulmonary disease, unspecified: Secondary | ICD-10-CM | POA: Insufficient documentation

## 2022-05-06 HISTORY — PX: CATARACT EXTRACTION W/PHACO: SHX586

## 2022-05-06 SURGERY — PHACOEMULSIFICATION, CATARACT, WITH IOL INSERTION
Anesthesia: Monitor Anesthesia Care | Site: Eye | Laterality: Right

## 2022-05-06 MED ORDER — MIDAZOLAM HCL 2 MG/2ML IJ SOLN
INTRAMUSCULAR | Status: DC | PRN
  Administered 2022-05-06: 2 mg via INTRAVENOUS

## 2022-05-06 MED ORDER — FENTANYL CITRATE PF 50 MCG/ML IJ SOSY: 25.0000 ug | PREFILLED_SYRINGE | INTRAMUSCULAR | Status: DC | PRN

## 2022-05-06 MED ORDER — ARMC OPHTHALMIC DILATING DROPS
1.0000 | OPHTHALMIC | Status: AC | PRN
  Administered 2022-05-06 (×3): 1 via OPHTHALMIC

## 2022-05-06 MED ORDER — SIGHTPATH DOSE#1 BSS IO SOLN
INTRAOCULAR | Status: DC | PRN
  Administered 2022-05-06: 71 mL via OPHTHALMIC

## 2022-05-06 MED ORDER — MOXIFLOXACIN HCL 0.5 % OP SOLN
OPHTHALMIC | Status: DC | PRN
  Administered 2022-05-06: 0.2 mL via OPHTHALMIC

## 2022-05-06 MED ORDER — TETRACAINE HCL 0.5 % OP SOLN
1.0000 [drp] | OPHTHALMIC | Status: AC | PRN
  Administered 2022-05-06 (×3): 1 [drp] via OPHTHALMIC

## 2022-05-06 MED ORDER — LIDOCAINE HCL (PF) 2 % IJ SOLN
INTRAOCULAR | Status: DC | PRN
  Administered 2022-05-06: 1 mL via INTRAOCULAR

## 2022-05-06 MED ORDER — ONDANSETRON HCL 4 MG/2ML IJ SOLN
4.0000 mg | Freq: Once | INTRAMUSCULAR | Status: DC | PRN
Start: 2022-05-06 — End: 2022-05-06

## 2022-05-06 MED ORDER — SIGHTPATH DOSE#1 BSS IO SOLN
INTRAOCULAR | Status: DC | PRN
  Administered 2022-05-06: 15 mL

## 2022-05-06 MED ORDER — LACTATED RINGERS IV SOLN: INTRAVENOUS | Status: DC

## 2022-05-06 MED ORDER — FENTANYL CITRATE (PF) 100 MCG/2ML IJ SOLN
INTRAMUSCULAR | Status: DC | PRN
  Administered 2022-05-06: 50 ug via INTRAVENOUS

## 2022-05-06 MED ORDER — SIGHTPATH DOSE#1 NA HYALUR & NA CHOND-NA HYALUR IO KIT
PACK | INTRAOCULAR | Status: DC | PRN
  Administered 2022-05-06: 1 via OPHTHALMIC

## 2022-05-06 SURGICAL SUPPLY — 14 items
CATARACT SUITE SIGHTPATH (MISCELLANEOUS) ×1 IMPLANT
DISSECTOR HYDRO NUCLEUS 50X22 (MISCELLANEOUS) ×1 IMPLANT
FEE CATARACT SUITE SIGHTPATH (MISCELLANEOUS) ×1 IMPLANT
GLOVE SURG GAMMEX PI TX LF 7.5 (GLOVE) ×1 IMPLANT
GLOVE SURG SYN 8.5  E (GLOVE) ×1
GLOVE SURG SYN 8.5 E (GLOVE) ×1 IMPLANT
GLOVE SURG SYN 8.5 PF PI (GLOVE) ×1 IMPLANT
LENS IOL TECNIS EYHANCE 16.0 (Intraocular Lens) IMPLANT
NDL FILTER BLUNT 18X1 1/2 (NEEDLE) ×1 IMPLANT
NEEDLE FILTER BLUNT 18X 1/2SAF (NEEDLE) ×1
NEEDLE FILTER BLUNT 18X1 1/2 (NEEDLE) ×1 IMPLANT
SYR 3ML LL SCALE MARK (SYRINGE) ×1 IMPLANT
SYR 5ML LL (SYRINGE) ×1 IMPLANT
WATER STERILE IRR 250ML POUR (IV SOLUTION) ×1 IMPLANT

## 2022-05-06 NOTE — Anesthesia Postprocedure Evaluation (Signed)
Anesthesia Post Note  Patient: April Raymond  Procedure(s) Performed: CATARACT EXTRACTION PHACO AND INTRAOCULAR LENS PLACEMENT (IOC) RIGHT 1.83 00:25.4 (Right: Eye)     Patient location during evaluation: PACU Anesthesia Type: MAC Level of consciousness: awake and alert Pain management: pain level controlled Vital Signs Assessment: post-procedure vital signs reviewed and stable Respiratory status: spontaneous breathing, nonlabored ventilation, respiratory function stable and patient connected to nasal cannula oxygen Cardiovascular status: stable and blood pressure returned to baseline Postop Assessment: no apparent nausea or vomiting Anesthetic complications: no   No notable events documented.  [name]

## 2022-05-06 NOTE — H&P (Signed)
[facility]   Primary Care Physician:  Venita Lick, NP Ophthalmologist: Dr. Benay Pillow  Pre-Procedure History & Physical: HPI:  April Raymond is a 61 y.o. female here for cataract surgery.   Past Medical History:  Diagnosis Date   Alpha-1-antitrypsin deficiency carrier 02/20/2021   MZ phenotype   Anginal pain ([place])    CAD (coronary artery disease)    a. 2017 s/p prior MI & PCI RCA ([place]); b. 05/2020 Abnl ETT: 1.5-45m horizontal ST dep in inflat leads @ peak stress w/ freq PVCs/couplets/bigeminy. HTN response; b. 05/2020 NSTEMI/Cath: LM nl, LAD 452mLCX 20, RCA 35ost, patent prev placed prox-dist stent. EF 25-35% w/ apical ballooning.   COPD (chronic obstructive pulmonary disease) ([place]   Dental bridge present    Permanent lower   Hashimoto's disease    Hidradenitis suppurativa    Hydradenitis    [place] ([place]   Hypertension    [place]   Pericardial effusion    a. 05/2020 Echo: sm to mod circumferential peric eff w/o tamponade; b. 07/2020 Mod peric effusion; c. 09/2020 Echo: EF 60-65%, no rwma, small tomod effusion w/o compromise.   Raynaud's syndrome    [facility]   Takotsubo cardiomyopathy    a. 05/2020 Echo: EF 35-40%, glob HK, though basal wall motion best preserved consistent w/ stress induced CM. Gr1 DD. Nl RV size/fxn. Sm to mod circumferential pericardial effusion w/o tamponade.   Wears contact lenses    Wears hearing aid in left ear     Past Surgical History:  Procedure Laterality Date   BREAST BIOPSY Right 05/04/2021   stereo x clip BENIGN BREAST TISSUE WITH MILD STROMAL FIBROSIS AND HEMORRHAGE   BREAST CYST ASPIRATION Right 05/01/2021   BREAST EXCISIONAL BIOPSY Left    age 61's   CARDIAC CATHETERIZATION     CATARACT EXTRACTION W/PHACO Left 04/22/2022   Procedure: CATARACT EXTRACTION PHACO AND INTRAOCULAR LENS PLACEMENT ([place];  Surgeon: KiEulogio BearMD;  [place] Service:  Ophthalmology;  Laterality: Left;  1.42 00:19.9   CHEST WALL RECONSTRUCTION Right 05/09/2021   Procedure: CHEST WALL MASS RESECTION;  Surgeon: LiLajuana MatteMD;  [place]: [facility] Service: Thoracic;  Laterality: Right;  posterior   CYST EXCISION  x2   LEFT HEART CATH AND CORONARY ANGIOGRAPHY N/A 05/31/2020   Procedure: LEFT HEART CATH AND CORONARY ANGIOGRAPHY;  Surgeon: EnNelva BushMD;  Location: [facility];  Service: Cardiovascular;  Laterality: N/A;   LIPOMA EXCISION     PARATHYROIDECTOMY     TONSILLECTOMY      Prior to Admission medications   Medication Sig Start Date End Date Taking? Authorizing Provider  acetaminophen (TYLENOL) 500 MG tablet Take 1,000 mg by mouth daily as needed.   Yes [provider]  ANCeledonio Miyamoto2.5-25 MCG/ACT AEPB TAKE 1 PUFF BY MOUTH EVERY DAY 02/26/22  Yes GoTyler PitaMD  atorvastatin (LIPITOR) 80 MG tablet Take 1 tablet (80 mg total) by mouth daily. 09/12/21  Yes Cannady, Jolene T, NP  cetirizine (ZYRTEC) 10 MG tablet Take 10 mg by mouth daily.   Yes [provider]  cholecalciferol (VITAMIN D3) 25 MCG (1000 UNIT) tablet Take 1,000 Units by mouth daily.   Yes [provider]  diphenhydrAMINE (BENADRYL) 25 MG tablet Take 25 mg by mouth at bedtime.   Yes [provider]  fluticasone (FLONASE) 50 MCG/ACT nasal spray Place 2 sprays into both nostrils  daily.   Yes [provider]  furosemide (LASIX) 20 MG tablet Take 1 tablet (20 mg total) by mouth daily as needed. 03/20/22  Yes End, Harrell Gave, MD  levothyroxine (SYNTHROID) 75 MCG tablet TAKE 1 TABLET BY MOUTH EVERY DAY BEFORE BREAKFAST 12/24/21  Yes Cannady, Jolene T, NP  losartan (COZAAR) 25 MG tablet Take 0.5 tablets (12.5 mg total) by mouth daily. 09/12/21  Yes Cannady, Jolene T, NP  magnesium oxide (MAG-OX) 400 MG tablet Take 400 mg by mouth daily.   Yes [provider]  metoprolol succinate (TOPROL-XL) 25 MG 24 hr tablet Take  0.5 tablets (12.5 mg total) by mouth daily. 09/12/21  Yes Cannady, Jolene T, NP  Multiple Vitamins-Minerals (CENTRUM SILVER 50+WOMEN PO) Take 1 tablet by mouth daily at 6 (six) AM.   Yes [provider]  pantoprazole (PROTONIX) 40 MG tablet Take 1 tablet (40 mg total) by mouth daily. 09/12/21  Yes Cannady, Jolene T, NP  PREMARIN vaginal cream PLACE 0.5 GRAMS TWICE WEEKLY INTRAVAGINALLY. 05/14/21  Yes Cannady, Jolene T, NP  PROAIR RESPICLICK 188 (90 Base) MCG/ACT AEPB INHALE 2 PUFFS INTO THE LUNGS EVERY 6 (SIX) HOURS AS NEEDED (SOB/WHEEZING). 04/18/22  Yes Tyler Pita, MD  ticagrelor (BRILINTA) 60 MG TABS tablet TAKE 1 TABLET (60 MG TOTAL) BY MOUTH 2 (TWO) TIMES DAILY. 09/12/21  Yes Cannady, Jolene T, NP  nitroGLYCERIN (NITROSTAT) 0.4 MG SL tablet Place 1 tablet (0.4 mg total) under the tongue every 5 (five) minutes as needed for chest pain. 03/25/22 06/23/22  Marnee Guarneri T, NP  Zoster Vaccine Adjuvanted [facility]) injection Inject 0.5 mLs into the muscle once for 1 dose. Dose # 2 05/08/22 05/08/22  Marnee Guarneri T, NP    Allergies as of 02/25/2022 - Review Complete 02/19/2022  Allergen Reaction Noted   Azithromycin Rash 12/26/2021   Humira [adalimumab] Rash 09/30/2019   Tape Rash 05/09/2021    Family History  Problem Relation Age of Onset   Heart attack Mother 50   COPD Mother    Stroke Mother    Diverticulitis Mother    Cancer Mother        breast   Hypertension Mother    Breast cancer Mother 14   Hypertension Father    Stroke Maternal Grandfather    Alzheimer's disease Paternal Grandmother    Heart attack Paternal Grandfather    Breast cancer Cousin        mat cousin x 2   Hypertension Brother     Social History   Socioeconomic History   Marital status: Married    Spouse name: Not on file   Number of children: Not on file   Years of education: Not on file   Highest education level: Not on file  Occupational History   Not on file  Tobacco Use   Smoking  status: Former    Packs/day: 1.00    Years: 40.00    Total pack years: 40.00    Types: Cigarettes    Quit date: 09/18/2015    Years since quitting: 6.6   Smokeless tobacco: Never  Vaping Use   Vaping Use: Never used  Substance and Sexual Activity   Alcohol use: Yes    Alcohol/week: 6.0 standard drinks of alcohol    Types: 6 Standard drinks or equivalent per week    Comment: 2-3 glasses wine, 2-3x/wk   Drug use: Never   Sexual activity: Not on file  Other Topics Concern   Not on file  Social History Narrative  Not on file   Social Determinants of Health   Financial Resource Strain: Low Risk  (09/12/2021)   Overall Financial Resource Strain (CARDIA)    Difficulty of Paying Living Expenses: Not hard at all  Food Insecurity: No Food Insecurity (09/12/2021)   Hunger Vital Sign    Worried About Running Out of Food in the Last Year: Never true    Ran Out of Food in the Last Year: Never true  Transportation Needs: No Transportation Needs (09/12/2021)   PRAPARE - Hydrologist (Medical): No    Lack of Transportation (Non-Medical): No  Physical Activity: Sufficiently Active (09/12/2021)   Exercise Vital Sign    Days of Exercise per Week: 4 days    Minutes of Exercise per Session: 40 min  Stress: No Stress Concern Present (09/12/2021)   [place]    Feeling of Stress : Only a little  Social Connections: Moderately Integrated (09/12/2021)   Social Connection and Isolation Panel [NHANES]    Frequency of Communication with Friends and Family: More than three times a week    Frequency of Social Gatherings with Friends and Family: More than three times a week    Attends Religious Services: More than 4 times per year    Active Member of [employer] or Organizations: No    Attends Archivist Meetings: Never    Marital Status: Married  Human resources officer Violence: Not At Risk (09/12/2021)    Humiliation, Afraid, Rape, and Kick questionnaire    Fear of Current or Ex-Partner: No    Emotionally Abused: No    Physically Abused: No    Sexually Abused: No    Review of Systems: See HPI, otherwise negative ROS  Physical Exam: BP 132/71   Pulse 66   Temp (!) 97.3 F (36.3 C) (Temporal)   Wt 67.1 kg   LMP 11/16/2007 (Approximate)   SpO2 99%   BMI 23.89 kg/m  General:   Alert, cooperative in NAD Head:  Normocephalic and atraumatic. Respiratory:  Normal work of breathing. Cardiovascular:  RRR  Impression/Plan: Raynaldo Opitz is here for cataract surgery.  Risks, benefits, limitations, and alternatives regarding cataract surgery have been reviewed with the patient.  Questions have been answered.  All parties agreeable.   Benay Pillow, MD  05/06/2022, 11:28 AM

## 2022-05-06 NOTE — Anesthesia Preprocedure Evaluation (Signed)
Anesthesia Evaluation  Patient identified by MRN, date of birth, ID band Patient awake    Reviewed: Allergy & Precautions, H&P , NPO status , Patient's Chart, lab work & pertinent test results, reviewed documented beta blocker date and time   Airway Mallampati: II  TM Distance: >3 FB Neck ROM: full    Dental no notable dental hx. (+) Teeth Intact   Pulmonary COPD, former smoker,    Pulmonary exam normal breath sounds clear to auscultation       Cardiovascular Exercise Tolerance: Good hypertension, On Medications + angina with exertion + CAD, + Past MI and + Peripheral Vascular Disease   Rhythm:regular Rate:Normal     Neuro/Psych CVA negative psych ROS   GI/Hepatic negative GI ROS, Neg liver ROS,   Endo/Other  negative endocrine ROSdiabetes  Renal/GU Renal disease     Musculoskeletal   Abdominal   Peds  Hematology negative hematology ROS (+)   Anesthesia Other Findings   Reproductive/Obstetrics negative OB ROS                             Anesthesia Physical Anesthesia Plan  ASA: 3  Anesthesia Plan: MAC   Post-op Pain Management:    Induction:   PONV Risk Score and Plan: 2  Airway Management Planned:   Additional Equipment:   Intra-op Plan:   Post-operative Plan:   Informed Consent: I have reviewed the patients History and Physical, chart, labs and discussed the procedure including the risks, benefits and alternatives for the proposed anesthesia with the patient or authorized representative who has indicated his/her understanding and acceptance.       Plan Discussed with: CRNA  Anesthesia Plan Comments:         Anesthesia Quick Evaluation

## 2022-05-06 NOTE — Op Note (Signed)
OPERATIVE NOTE  April Raymond [id number] 05/06/2022   PREOPERATIVE DIAGNOSIS:  Nuclear sclerotic cataract right eye.  H25.11   POSTOPERATIVE DIAGNOSIS:    Nuclear sclerotic cataract right eye.     PROCEDURE:  Phacoemusification with posterior chamber intraocular lens placement of the right eye   LENS:   Implant Name Type Inv. Item Serial No. Manufacturer Lot No. LRB No. Used Action  LENS IOL TECNIS EYHANCE 16.0 - [id number] Intraocular Lens LENS IOL TECNIS EYHANCE 16.0 [id number] [employer]  Right 1 Implanted       Procedure(s): CATARACT EXTRACTION PHACO AND INTRAOCULAR LENS PLACEMENT (IOC) RIGHT 1.83 00:25.4 (Right)  DIB00 +16.0   ULTRASOUND TIME: 0 minutes 25 seconds.  CDE 1.83   SURGEON:  Benay Pillow, MD, MPH  ANESTHESIOLOGIST: Anesthesiologist: [name], MD CRNA: Janna Arch, CRNA   ANESTHESIA:  Topical with tetracaine drops augmented with 1% preservative-free intracameral lidocaine.  ESTIMATED BLOOD LOSS: less than 1 mL.   COMPLICATIONS:  None.   DESCRIPTION OF PROCEDURE:  The patient was identified in the holding room and transported to the operating room and placed in the supine position under the operating microscope.  The right eye was identified as the operative eye and it was prepped and draped in the usual sterile ophthalmic fashion.   A 1.0 millimeter clear-corneal paracentesis was made at the 10:30 position. 0.5 ml of preservative-free 1% lidocaine with epinephrine was injected into the anterior chamber.  The anterior chamber was filled with viscoelastic.  A 2.4 millimeter keratome was used to make a near-clear corneal incision at the 8:00 position.  A curvilinear capsulorrhexis was made with a cystotome and capsulorrhexis forceps.  Balanced salt solution was used to hydrodissect and hydrodelineate the nucleus.   Phacoemulsification was then used in stop and chop fashion to remove the lens nucleus and epinucleus.  The remaining cortex was then  removed using the irrigation and aspiration handpiece. Viscoelastic was then placed into the capsular bag to distend it for lens placement.  A lens was then injected into the capsular bag.  The remaining viscoelastic was aspirated.   Wounds were hydrated with balanced salt solution.  The anterior chamber was inflated to a physiologic pressure with balanced salt solution.   Intracameral vigamox 0.1 mL undiluted was injected into the eye and a drop placed onto the ocular surface.  No wound leaks were noted.  The patient was taken to the recovery room in stable condition without complications of anesthesia or surgery  Benay Pillow 05/06/2022, 11:55 AM

## 2022-05-06 NOTE — Transfer of Care (Signed)
Immediate Anesthesia Transfer of Care Note  Patient: April Raymond  Procedure(s) Performed: CATARACT EXTRACTION PHACO AND INTRAOCULAR LENS PLACEMENT (IOC) RIGHT 1.83 00:25.4 (Right: Eye)  Patient Location: PACU  Anesthesia Type: MAC  Level of Consciousness: awake, alert  and patient cooperative  Airway and Oxygen Therapy: Patient Spontanous Breathing  Post-op Assessment: Post-op Vital signs reviewed, Patient's Cardiovascular Status Stable, Respiratory Function Stable, Patent Airway and No signs of Nausea or vomiting  Post-op Vital Signs: Reviewed and stable  Complications: No notable events documented.

## 2022-05-07 ENCOUNTER — Encounter: Payer: Self-pay | Admitting: Ophthalmology

## 2022-05-09 ENCOUNTER — Encounter: Payer: Self-pay | Admitting: Dermatology

## 2022-05-13 ENCOUNTER — Ambulatory Visit
Admission: RE | Admit: 2022-05-13 | Discharge: 2022-05-13 | Disposition: A | Discharge status: Home / Self Care | Payer: BLUE CROSS/BLUE SHIELD | Source: Ambulatory Visit | Attending: Nurse Practitioner | Admitting: Nurse Practitioner

## 2022-05-13 DIAGNOSIS — Z1231 Encounter for screening mammogram for malignant neoplasm of breast: Secondary | ICD-10-CM

## 2022-05-13 DIAGNOSIS — R928 Other abnormal and inconclusive findings on diagnostic imaging of breast: Secondary | ICD-10-CM | POA: Diagnosis present

## 2022-05-13 DIAGNOSIS — N63 Unspecified lump in unspecified breast: Secondary | ICD-10-CM

## 2022-05-13 DIAGNOSIS — J449 Chronic obstructive pulmonary disease, unspecified: Secondary | ICD-10-CM | POA: Insufficient documentation

## 2022-05-13 DIAGNOSIS — E8801 Alpha-1-antitrypsin deficiency: Secondary | ICD-10-CM | POA: Insufficient documentation

## 2022-05-13 NOTE — Progress Notes (Signed)
Contacted via MyChart   Normal mammogram, may repeat in one year:)

## 2022-05-14 ENCOUNTER — Ambulatory Visit (HOSPITAL_BASED_OUTPATIENT_CLINIC_OR_DEPARTMENT_OTHER): Payer: BLUE CROSS/BLUE SHIELD

## 2022-05-14 DIAGNOSIS — J449 Chronic obstructive pulmonary disease, unspecified: Secondary | ICD-10-CM | POA: Diagnosis not present

## 2022-05-14 DIAGNOSIS — R928 Other abnormal and inconclusive findings on diagnostic imaging of breast: Secondary | ICD-10-CM | POA: Diagnosis not present

## 2022-05-14 DIAGNOSIS — E8801 Alpha-1-antitrypsin deficiency: Secondary | ICD-10-CM | POA: Diagnosis not present

## 2022-05-14 LAB — PULMONARY FUNCTION TEST ARMC ONLY
DL/VA % pred: 63 %
DL/VA: 2.62 ml/min/mmHg/L
DLCO unc % pred: 62 %
DLCO unc: 13.34 ml/min/mmHg
FEF 25-75 Post: 1.01 L/sec
FEF 25-75 Pre: 1.37 L/sec
FEF2575-%Change-Post: -26 %
FEF2575-%Pred-Post: 42 %
FEF2575-%Pred-Pre: 57 %
FEV1-%Change-Post: -13 %
FEV1-%Pred-Post: 70 %
FEV1-%Pred-Pre: 81 %
FEV1-Post: 1.91 L
FEV1-Pre: 2.2 L
FEV1FVC-%Change-Post: -9 %
FEV1FVC-%Pred-Pre: 89 %
FEV6-%Change-Post: -4 %
FEV6-%Pred-Post: 88 %
FEV6-%Pred-Pre: 92 %
FEV6-Post: 2.98 L
FEV6-Pre: 3.12 L
FEV6FVC-%Change-Post: 0 %
FEV6FVC-%Pred-Post: 101 %
FEV6FVC-%Pred-Pre: 102 %
FVC-%Change-Post: -3 %
FVC-%Pred-Post: 87 %
FVC-%Pred-Pre: 90 %
FVC-Post: 3.05 L
FVC-Pre: 3.17 L
Post FEV1/FVC ratio: 63 %
Post FEV6/FVC ratio: 98 %
Pre FEV1/FVC ratio: 69 %
Pre FEV6/FVC Ratio: 98 %
RV % pred: 142 %
RV: 3.03 L
TLC % pred: 113 %
TLC: 6.09 L

## 2022-05-14 MED ORDER — ALBUTEROL SULFATE (2.5 MG/3ML) 0.083% IN NEBU: 2.5000 mg | INHALATION_SOLUTION | Freq: Once | RESPIRATORY_TRACT | Status: AC

## 2022-05-28 ENCOUNTER — Other Ambulatory Visit: Payer: Self-pay | Admitting: Pulmonary Disease

## 2022-06-21 ENCOUNTER — Other Ambulatory Visit: Payer: Self-pay | Admitting: Nurse Practitioner

## 2022-06-21 NOTE — Telephone Encounter (Signed)
Requested Prescriptions  Pending Prescriptions Disp Refills  . levothyroxine (SYNTHROID) 75 MCG tablet [Pharmacy Med Name: LEVOTHYROXINE 75 MCG TABLET] 90 tablet 1    Sig: TAKE 1 TABLET BY MOUTH EVERY DAY BEFORE BREAKFAST     Endocrinology:  Hypothyroid Agents Passed - 06/21/2022  2:35 AM      Passed - TSH in normal range and within 360 days    TSH  Date Value Ref Range Status  09/12/2021 4.380 0.450 - 4.500 uIU/mL Final         Passed - Valid encounter within last 12 months    Recent Outpatient Visits          3 months ago Moderate COPD (chronic obstructive pulmonary disease) ([place])   [place] Cannady, Jolene T, NP   5 months ago Generalized rash   [place] Se[place], [place] T, NP   6 months ago Generalized rash   [place], [place] T, NP   9 months ago Nonischemic cardiomyopathy ([place])   [place], Jolene T, NP   1 year ago Coronary artery disease of native artery of native heart with stable angina pectoris ([place])   [place], Barbaraann Faster, NP      Future Appointments            In 2 months Cannady, Barbaraann Faster, NP [employer]

## 2022-07-22 ENCOUNTER — Ambulatory Visit
Admission: EM | Admit: 2022-07-22 | Discharge: 2022-07-22 | Disposition: A | Discharge status: Home / Self Care | Payer: No Typology Code available for payment source | Attending: Urgent Care | Admitting: Urgent Care

## 2022-07-22 DIAGNOSIS — B9689 Other specified bacterial agents as the cause of diseases classified elsewhere: Secondary | ICD-10-CM

## 2022-07-22 DIAGNOSIS — J019 Acute sinusitis, unspecified: Secondary | ICD-10-CM | POA: Diagnosis not present

## 2022-07-22 DIAGNOSIS — J209 Acute bronchitis, unspecified: Secondary | ICD-10-CM | POA: Diagnosis not present

## 2022-07-22 MED ORDER — BENZONATATE 100 MG PO CAPS: ORAL_CAPSULE | ORAL | 0 refills | Status: DC

## 2022-07-22 MED ORDER — HYDROCOD POLI-CHLORPHE POLI ER 10-8 MG/5ML PO SUER: 5.0000 mL | Freq: Two times a day (BID) | ORAL | 0 refills | Status: AC | PRN

## 2022-07-22 MED ORDER — PREDNISONE 10 MG (21) PO TBPK: ORAL_TABLET | Freq: Every day | ORAL | 0 refills | Status: DC

## 2022-07-22 MED ORDER — AMOXICILLIN-POT CLAVULANATE 875-125 MG PO TABS: 1.0000 | ORAL_TABLET | Freq: Two times a day (BID) | ORAL | 0 refills | Status: DC

## 2022-07-22 NOTE — Discharge Instructions (Addendum)
Follow up here or with your primary care provider if your symptoms are worsening or not improving with treatment.     

## 2022-07-22 NOTE — ED Provider Notes (Signed)
Roderic Palau    CSN: [id number] Arrival date & time: 07/22/22  1325      History   Chief Complaint Chief Complaint  Patient presents with   Sore Throat   Cough   Nasal Congestion    HPI INETTA DICKE is a 61 y.o. female.    Sore Throat  Cough   Presents to UC with complaint of cough productive of yellow sputum, sore throat x 1.5 weeks. Reports sinus pain/pressure. Denies fever, chills, myalgias, malaise.  She presents with her husband who endorses recently poor sleep because of constant coughing at nighttime.  Taking Robitussin which does not effectively control cough.  Past Medical History:  Diagnosis Date   Alpha-1-antitrypsin deficiency carrier 02/20/2021   MZ phenotype   Anginal pain ([place])    CAD (coronary artery disease)    a. 2017 s/p prior MI & PCI RCA ([place]); b. 05/2020 Abnl ETT: 1.5-88m horizontal ST dep in inflat leads @ peak stress w/ freq PVCs/couplets/bigeminy. HTN response; b. 05/2020 NSTEMI/Cath: LM nl, LAD 468mLCX 20, RCA 35ost, patent prev placed prox-dist stent. EF 25-35% w/ apical ballooning.   COPD (chronic obstructive pulmonary disease) ([facility]   Dental bridge present    Permanent lower   Hashimoto's disease    Hidradenitis suppurativa    Hydradenitis    Hyperparathyroidism ([facility]   Hypertension    Myocardial infarction ([facility]   Pericardial effusion    a. 05/2020 Echo: sm to mod circumferential peric eff w/o tamponade; b. 07/2020 Mod peric effusion; c. 09/2020 Echo: EF 60-65%, no rwma, small tomod effusion w/o compromise.   Raynaud's syndrome    [facility]   Takotsubo cardiomyopathy    a. 05/2020 Echo: EF 35-40%, glob HK, though basal wall motion best preserved consistent w/ stress induced CM. Gr1 DD. Nl RV size/fxn. Sm to mod circumferential pericardial effusion w/o tamponade.   Wears contact lenses    Wears hearing aid in left ear     Patient Active Problem List   Diagnosis Date Noted   Spondylosis without myelopathy or  radiculopathy, thoracic region 01/24/2022   Chronic anticoagulation ([facility]) 01/17/2022   DDD (degenerative disc disease), thoracic 01/09/2022   Thoracic facet syndrome 01/09/2022   Cervical facet syndrome 01/09/2022   Tinnitus of left ear 12/12/2021   Cervicalgia 11/19/2021   DDD (degenerative disc disease), cervical 11/19/2021   Grade 1 Anterolisthesis of cervical spine (C7/T1) 11/19/2021   Grade 1 Anterolisthesis of thoracic spine (T2/T3) 11/19/2021   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   Chronic pain syndrome 11/17/2021   Chronic upper back pain (1ry area of Pain) (Right) 10/22/2021   Chronic thoracic back pain (Bilateral) (R>L) 10/22/2021   Chronic neck pain (2ry area of Pain) (Bilateral) (R>L) 10/22/2021   Chronic shoulder pain (3ry area of Pain) (Bilateral) 10/22/2021   Painful cervical range of motion 10/22/2021   Impaired range of motion of cervical spine 10/22/2021   Spasm of thoracic back muscle 10/22/2021   CKD (chronic kidney disease) stage 3, GFR 30-59 ml/min (HCC) 09/08/2021   H/O lipoma 05/09/2021   Alpha-1-antitrypsin deficiency carrier 02/20/2021   Moderate COPD (chronic obstructive pulmonary disease) ([facility]04/29/2022   PAD (peripheral artery disease) ([facility]01/03/2021   Nonischemic cardiomyopathy ([facility]CO[facility]09/15/2021   Former smoker 09/30/2019   History of MI (myocardial infarction) 09/30/2019   History of parathyroidectomy (HCPonce01/14/2021   Coronary artery disease of native artery of native heart with stable angina pectoris ([facility]CM[facility] [place]01/14/2021   Essential hypertension  09/30/2019   Hyperlipidemia LDL goal <70 09/30/2019   Hashimoto's disease 09/30/2019   Vaginal atrophy 09/30/2019   Hidradenitis suppurativa 09/30/2019    Past Surgical History:  Procedure Laterality Date   BREAST BIOPSY Right 05/04/2021   stereo x clip BENIGN BREAST TISSUE WITH MILD STROMAL FIBROSIS AND HEMORRHAGE   BREAST CYST ASPIRATION Right 05/01/2021   BREAST EXCISIONAL BIOPSY Left     age 43's   CARDIAC CATHETERIZATION     CATARACT EXTRACTION W/PHACO Left 04/22/2022   Procedure: CATARACT EXTRACTION PHACO AND INTRAOCULAR LENS PLACEMENT ([place]) LEFT;  Surgeon: Eulogio Bear, MD;  Location: [facility];  Service: Ophthalmology;  Laterality: Left;  1.42 00:19.9   CATARACT EXTRACTION W/PHACO Right 05/06/2022   Procedure: CATARACT EXTRACTION PHACO AND INTRAOCULAR LENS PLACEMENT ([facility]) RIGHT 1.83 00:25.4;  Surgeon: Eulogio Bear, MD;  Location: [facility];  Service: Ophthalmology;  Laterality: Right;   CHEST WALL RECONSTRUCTION Right 05/09/2021   Procedure: CHEST WALL MASS RESECTION;  Surgeon: [name], MD;  Location: [facility];  Service: Thoracic;  Laterality: Right;  posterior   CYST EXCISION  x2   LEFT HEART CATH AND CORONARY ANGIOGRAPHY N/A 05/31/2020   Procedure: LEFT HEART CATH AND CORONARY ANGIOGRAPHY;  Surgeon: Nelva Bush, MD;  Location: [facility];  Service: Cardiovascular;  Laterality: N/A;   LIPOMA EXCISION     PARATHYROIDECTOMY     TONSILLECTOMY      OB History   No obstetric history on file.      Home Medications    Prior to Admission medications   Medication Sig Start Date End Date Taking? Authorizing Provider  acetaminophen (TYLENOL) 500 MG tablet Take 1,000 mg by mouth daily as needed.    [provider]  Celedonio Miyamoto 62.5-25 MCG/ACT AEPB INHALE 1 PUFF BY MOUTH EVERY DAY 05/28/22   Tyler Pita, MD  atorvastatin (LIPITOR) 80 MG tablet Take 1 tablet (80 mg total) by mouth daily. 09/12/21   Cannady, Henrine Screws T, NP  cetirizine (ZYRTEC) 10 MG tablet Take 10 mg by mouth daily.    [provider]  cholecalciferol (VITAMIN D3) 25 MCG (1000 UNIT) tablet Take 1,000 Units by mouth daily.    [provider]  diphenhydrAMINE (BENADRYL) 25 MG tablet Take 25 mg by mouth at bedtime.    [provider]  fluticasone (FLONASE) 50 MCG/ACT nasal spray Place 2 sprays into both nostrils daily.     [provider]  furosemide (LASIX) 20 MG tablet Take 1 tablet (20 mg total) by mouth daily as needed. 03/20/22   End, Harrell Gave, MD  levothyroxine (SYNTHROID) 75 MCG tablet TAKE 1 TABLET BY MOUTH EVERY DAY BEFORE BREAKFAST 06/21/22   Cannady, Jolene T, NP  losartan (COZAAR) 25 MG tablet Take 0.5 tablets (12.5 mg total) by mouth daily. 09/12/21   Cannady, Henrine Screws T, NP  magnesium oxide (MAG-OX) 400 MG tablet Take 400 mg by mouth daily.    [provider]  metoprolol succinate (TOPROL-XL) 25 MG 24 hr tablet Take 0.5 tablets (12.5 mg total) by mouth daily. 09/12/21   Cannady, Henrine Screws T, NP  Multiple Vitamins-Minerals (CENTRUM SILVER 50+WOMEN PO) Take 1 tablet by mouth daily at 6 (six) AM.    [provider]  nitroGLYCERIN (NITROSTAT) 0.4 MG SL tablet Place 1 tablet (0.4 mg total) under the tongue every 5 (five) minutes as needed for chest pain. 03/25/22 06/23/22  Cannady, Henrine Screws T, NP  pantoprazole (PROTONIX) 40 MG tablet Take 1 tablet (40 mg  total) by mouth daily. 09/12/21   Cannady, Henrine Screws T, NP  PREMARIN vaginal cream PLACE 0.5 GRAMS TWICE WEEKLY INTRAVAGINALLY. 05/14/21   Cannady, Barbaraann Faster, NP  PROAIR RESPICLICK 034 (90 Base) MCG/ACT AEPB INHALE 2 PUFFS INTO THE LUNGS EVERY 6 (SIX) HOURS AS NEEDED (SOB/WHEEZING). 04/18/22   Tyler Pita, MD  ticagrelor (BRILINTA) 60 MG TABS tablet TAKE 1 TABLET (60 MG TOTAL) BY MOUTH 2 (TWO) TIMES DAILY. 09/12/21   Venita Lick, NP    Family History Family History  Problem Relation Age of Onset   Heart attack Mother 63   COPD Mother    Stroke Mother    Diverticulitis Mother    Cancer Mother        breast   Hypertension Mother    Breast cancer Mother 12   Hypertension Father    Stroke Maternal Grandfather    Alzheimer's disease Paternal Grandmother    Heart attack Paternal Grandfather    Breast cancer Cousin        mat cousin x 2   Hypertension Brother     Social History Social History   Tobacco Use   Smoking  status: Former    Packs/day: 1.00    Years: 40.00    Total pack years: 40.00    Types: Cigarettes    Quit date: 09/18/2015    Years since quitting: 6.8   Smokeless tobacco: Never  Vaping Use   Vaping Use: Never used  Substance Use Topics   Alcohol use: Yes    Alcohol/week: 6.0 standard drinks of alcohol    Types: 6 Standard drinks or equivalent per week    Comment: 2-3 glasses wine, 2-3x/wk   Drug use: Never     Allergies   Azithromycin, Humira [adalimumab], and Tape   Review of Systems Review of Systems  Respiratory:  Positive for cough.      Physical Exam Triage Vital Signs ED Triage Vitals [07/22/22 1347]  Enc Vitals Group     BP      Pulse      Resp      Temp      Temp src      SpO2      Weight      Height      Head Circumference      Peak Flow      Pain Score 0     Pain Loc      Pain Edu?      Excl. in [place]?    No data found.  Updated Vital Signs LMP 11/16/2007 (Approximate)   Visual Acuity Right Eye Distance:   Left Eye Distance:   Bilateral Distance:    Right Eye Near:   Left Eye Near:    Bilateral Near:     Physical Exam Vitals reviewed.  Constitutional:      Appearance: She is well-developed.  HENT:     Nose: Congestion present.     Mouth/Throat:     Mouth: Mucous membranes are moist.     Pharynx: Posterior oropharyngeal erythema present. No oropharyngeal exudate.  Cardiovascular:     Rate and Rhythm: Normal rate and regular rhythm.  Pulmonary:     Effort: Pulmonary effort is normal.     Breath sounds: Normal breath sounds.  Skin:    General: Skin is warm and dry.  Neurological:     General: No focal deficit present.     Mental Status: She is alert and oriented to person, place, and time.  Psychiatric:        Mood and Affect: Mood normal.        Behavior: Behavior normal.      UC Treatments / Results  Labs (all labs ordered are listed, but only abnormal results are displayed) Labs Reviewed - No data to  display  EKG   Radiology No results found.  Procedures Procedures (including critical care time)  Medications Ordered in UC Medications - No data to display  Initial Impression / Assessment and Plan / UC Course  I have reviewed the triage vital signs and the nursing notes.  Pertinent labs & imaging results that were available during my care of the patient were reviewed by me and considered in my medical decision making (see chart for details).   Suspect bacterial sinusitis along with bronchitis secondary to past viral URI.  Will prescribe Augmentin x7 days along with steroid taper.  Recommended patient delay starting the steroid until tomorrow morning to avoid sleep disturbance.  We will also provide medication for cough relief.   Final Clinical Impressions(s) / UC Diagnoses   Final diagnoses:  None   Discharge Instructions   None    ED Prescriptions   None    PDMP not reviewed this encounter.   Rose Phi, [place] 07/22/22 1408

## 2022-07-22 NOTE — ED Triage Notes (Signed)
Pt. Presents to UC w/ c/o a cough, sore throat and production of yellow phlegm. Pt. Expresses concern for Bronchitis

## 2022-08-12 ENCOUNTER — Other Ambulatory Visit: Payer: Self-pay | Admitting: Internal Medicine

## 2022-09-09 DIAGNOSIS — E8801 Alpha-1-antitrypsin deficiency: Secondary | ICD-10-CM | POA: Insufficient documentation

## 2022-09-09 DIAGNOSIS — K219 Gastro-esophageal reflux disease without esophagitis: Secondary | ICD-10-CM | POA: Insufficient documentation

## 2022-09-09 NOTE — Patient Instructions (Signed)

## 2022-09-12 ENCOUNTER — Ambulatory Visit: Payer: No Typology Code available for payment source | Admitting: Nurse Practitioner

## 2022-09-13 ENCOUNTER — Encounter: Payer: Self-pay | Admitting: Nurse Practitioner

## 2022-09-13 ENCOUNTER — Ambulatory Visit (INDEPENDENT_AMBULATORY_CARE_PROVIDER_SITE_OTHER): Payer: No Typology Code available for payment source | Admitting: Nurse Practitioner

## 2022-09-13 VITALS — BP 134/82 | HR 73 | Temp 97.4°F | Ht 65.98 in | Wt 150.6 lb

## 2022-09-13 DIAGNOSIS — E785 Hyperlipidemia, unspecified: Secondary | ICD-10-CM

## 2022-09-13 DIAGNOSIS — Z9089 Acquired absence of other organs: Secondary | ICD-10-CM

## 2022-09-13 DIAGNOSIS — E559 Vitamin D deficiency, unspecified: Secondary | ICD-10-CM

## 2022-09-13 DIAGNOSIS — K219 Gastro-esophageal reflux disease without esophagitis: Secondary | ICD-10-CM

## 2022-09-13 DIAGNOSIS — E892 Postprocedural hypoparathyroidism: Secondary | ICD-10-CM

## 2022-09-13 DIAGNOSIS — I25118 Atherosclerotic heart disease of native coronary artery with other forms of angina pectoris: Secondary | ICD-10-CM

## 2022-09-13 DIAGNOSIS — J449 Chronic obstructive pulmonary disease, unspecified: Secondary | ICD-10-CM

## 2022-09-13 DIAGNOSIS — I10 Essential (primary) hypertension: Secondary | ICD-10-CM

## 2022-09-13 DIAGNOSIS — I739 Peripheral vascular disease, unspecified: Secondary | ICD-10-CM | POA: Diagnosis not present

## 2022-09-13 DIAGNOSIS — Z23 Encounter for immunization: Secondary | ICD-10-CM

## 2022-09-13 DIAGNOSIS — E063 Autoimmune thyroiditis: Secondary | ICD-10-CM

## 2022-09-13 DIAGNOSIS — Z0000 Encounter for general adult medical examination without abnormal findings: Secondary | ICD-10-CM

## 2022-09-13 DIAGNOSIS — E8801 Alpha-1-antitrypsin deficiency: Secondary | ICD-10-CM

## 2022-09-13 DIAGNOSIS — I252 Old myocardial infarction: Secondary | ICD-10-CM

## 2022-09-13 DIAGNOSIS — I428 Other cardiomyopathies: Secondary | ICD-10-CM

## 2022-09-13 DIAGNOSIS — N1832 Chronic kidney disease, stage 3b: Secondary | ICD-10-CM

## 2022-09-13 DIAGNOSIS — G894 Chronic pain syndrome: Secondary | ICD-10-CM

## 2022-09-13 LAB — MICROALBUMIN, URINE WAIVED
Creatinine, Urine Waived: 10 mg/dL (ref 10–300)
Microalb, Ur Waived: 10 mg/L (ref 0–19)
Microalb/Creat Ratio: <30 mg/g (ref <30)

## 2022-09-13 MED ORDER — FUROSEMIDE 20 MG PO TABS: 20.0000 mg | ORAL_TABLET | Freq: Every day | ORAL | 4 refills | Status: DC | PRN

## 2022-09-13 MED ORDER — METOPROLOL SUCCINATE ER 25 MG PO TB24: 12.5000 mg | ORAL_TABLET | Freq: Every day | ORAL | 4 refills | Status: DC

## 2022-09-13 MED ORDER — LOSARTAN POTASSIUM 25 MG PO TABS: 12.5000 mg | ORAL_TABLET | Freq: Every day | ORAL | 4 refills | Status: DC

## 2022-09-13 MED ORDER — PREMARIN 0.625 MG/GM VA CREA: TOPICAL_CREAM | VAGINAL | 12 refills | Status: DC

## 2022-09-13 MED ORDER — TICAGRELOR 60 MG PO TABS: ORAL_TABLET | ORAL | 4 refills | Status: DC

## 2022-09-13 MED ORDER — LEVOTHYROXINE SODIUM 75 MCG PO TABS: ORAL_TABLET | ORAL | 4 refills | Status: DC

## 2022-09-13 MED ORDER — PANTOPRAZOLE SODIUM 40 MG PO TBEC: 40.0000 mg | DELAYED_RELEASE_TABLET | Freq: Every day | ORAL | 4 refills | Status: DC

## 2022-09-13 MED ORDER — ATORVASTATIN CALCIUM 80 MG PO TABS: 80.0000 mg | ORAL_TABLET | Freq: Every day | ORAL | 4 refills | Status: DC

## 2022-09-13 NOTE — Assessment & Plan Note (Signed)
Stable, followed by cardiology.  Continue current medication regimen and collaboration with cardiology.

## 2022-09-13 NOTE — Assessment & Plan Note (Signed)
Chronic, stable -- improved on recent labs.  Recheck CMP today and continue Losartan for kidney protection.  Consider referral to nephrology if significant decline noted.  Urine ALB 10 today (December 2023).

## 2022-09-13 NOTE — Assessment & Plan Note (Signed)
Chronic, ongoing with history of MI.  Continue statin and adjust dose as needed. Could consider Rosuvastatin in future if any issues with Atorvastatin.  Lipid panel today, she is fasting.

## 2022-09-13 NOTE — Assessment & Plan Note (Signed)
Chronic, stable. Continue current collaboration with pulmonary and inhaler regimen as ordered by them.  Recommend continued cessation of smoking.

## 2022-09-13 NOTE — Assessment & Plan Note (Signed)
Chronic, ongoing.  Continue current medication regimen and adjust as needed.  Thyroid labs today.

## 2022-09-13 NOTE — Assessment & Plan Note (Signed)
Chronic, followed by pulmonary -- will continue this collaboration.  Recent notes reviewed. Appreciate their input.

## 2022-09-13 NOTE — Assessment & Plan Note (Signed)
Continue collaboration with cardiology and current regimen as ordered by them.

## 2022-09-13 NOTE — Assessment & Plan Note (Signed)
History of removal in 2018 and has been stable since, continue to monitor.

## 2022-09-13 NOTE — Progress Notes (Signed)
BP 134/82   Pulse 73   Temp (!) 97.4 F (36.3 C) (Oral)   Ht 5' 5.98" (1.676 m)   Wt 150 lb 9.6 oz (68.3 kg)   LMP 11/16/2007 (Approximate)   SpO2 99%   BMI 24.32 kg/m    Subjective:    Patient ID: April Raymond, female    DOB: 1961-03-23, 60 y.o.   MRN: [id number]  HPI: April Raymond is a 61 y.o. female presenting on 09/13/2022 for comprehensive medical examination. Current medical complaints include:none  She currently lives with: husband Menopausal Symptoms: no  HYPERTENSION WITH CAD & HLD Followed by cardiology , last visit 01/15/22, stress-induced cardiomyopathy and angina = he reported she can return in one year.    History of MI at age 46. Had parathyroid removed in 2018, history of hyperparathyroid.  She continues on Ticagrelor, Metoprolol, Losartan, Lasix daily and PRN (does take extra dose on occasion), and NTG.  Has not had to use NTG recently.   Echo on 10/05/20 noted EF 60-65%. Taking Atorvastatin 80 MG.  Takes Protonix daily for GI protection. Hypertension status: stable  Satisfied with current treatment? yes Duration of hypertension: chronic BP monitoring frequency:  occasionally BP range:  BP medication side effects:  no Medication compliance: good compliance Aspirin: ticagrelor, ASA Recurrent headaches: no Visual changes: no Palpitations: no Dyspnea: no Chest pain: no Lower extremity edema: no Dizzy/lightheaded: no  The ASCVD Risk score (Arnett DK, et al., 2019) failed to calculate for the following reasons:   The patient has a prior MI or stroke diagnosis  CHRONIC KIDNEY DISEASE Recent have been in normal range. CKD status: stable Medications renally dose: yes Previous renal evaluation: no Pneumovax:  Not up to Date Influenza Vaccine:  Up to Date   HYPOTHYROIDISM Continues on Levothyroxine 75 MCG daily.  Thyroid control status:stable Satisfied with current treatment? yes Medication side effects: no Medication compliance: good  compliance Etiology of hypothyroidism:  Recent dose adjustment:no Fatigue: no Cold intolerance: yes Heat intolerance: no Weight gain: no Weight loss: no Constipation: no Diarrhea/loose stools: no Palpitations: no Lower extremity edema: no Anxiety/depressed mood: no   COPD History of smoking, quit at 55 -- smoked 1 PPD.  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  Continues on Anoro and has Albuterol to use as needed.  Has used Albuterol only once. COPD status: stable Satisfied with current treatment?: yes Oxygen use: no Dyspnea frequency: no Cough frequency: no Rescue inhaler frequency:  no Limitation of activity: no Productive cough: none Last Spirometry: with pulmonary Pneumovax: Up to Date Influenza: Up to Date       09/13/2022   10:21 AM 03/13/2022   10:19 AM 01/24/2022   10:32 AM 12/26/2021   10:49 AM 12/12/2021    2:31 PM  Depression screen PHQ 2/9  Decreased Interest 0 0 0 0 0  Down, Depressed, Hopeless 0 0 0 0 0  PHQ - 2 Score 0 0 0 0 0  Altered sleeping 2 0  0 2  Tired, decreased energy 2 0  2 2  Change in appetite 0 0  0 0  Feeling bad or failure about yourself  0 0  0 0  Trouble concentrating 0 0  0 0  Moving slowly or fidgety/restless 0 0  0 0  Suicidal thoughts 0 0  0 0  PHQ-9 Score 4 0  2 4  Difficult doing work/chores Not difficult at all Not difficult at all  02/19/2022   12:44 PM 03/13/2022   10:19 AM 05/06/2022   10:32 AM 07/22/2022    1:48 PM 09/13/2022   10:20 AM  Fall Risk  Falls in the past year? 0 0   0  Was there an injury with Fall?  0   0  Fall Risk Category Calculator  0   0  Fall Risk Category  Low   Low  Patient Fall Risk Level Low fall risk Low fall risk Low fall risk Low fall risk   Patient at Risk for Falls Due to No Fall Risks No Fall Risks   No Fall Risks  Fall risk Follow up Falls evaluation completed Falls evaluation completed   Falls evaluation completed     Functional Status Survey: Is the  patient deaf or have difficulty hearing?: No Does the patient have difficulty seeing, even when wearing glasses/contacts?: No Does the patient have difficulty concentrating, remembering, or making decisions?: No Does the patient have difficulty walking or climbing stairs?: No Does the patient have difficulty dressing or bathing?: No Does the patient have difficulty doing errands alone such as visiting a doctor's office or shopping?: No   Past Medical History:  Past Medical History:  Diagnosis Date   Alpha-1-antitrypsin deficiency carrier 02/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 2017 s/p prior MI & PCI RCA ([place]); b. 05/2020 Abnl ETT: 1.5-27m horizontal ST dep in inflat leads @ peak stress w/ freq PVCs/couplets/bigeminy. HTN response; b. 05/2020 NSTEMI/Cath: LM nl, LAD 475mLCX 20, RCA 35ost, patent prev placed prox-dist stent. EF 25-35% w/ apical ballooning.   COPD (chronic obstructive pulmonary disease) ([place]   Dental bridge present    Permanent lower   Hashimoto's disease    Hidradenitis suppurativa    Hydradenitis    [place]parathyroidism ([place]   Hypertension    [place] infarction ([place]   Pericardial effusion    a. 05/2020 Echo: sm to mod circumferential peric eff w/o tamponade; b. 07/2020 Mod peric effusion; c. 09/2020 Echo: EF 60-65%, no rwma, small tomod effusion w/o compromise.   Raynaud's syndrome    [facility]   Takotsubo cardiomyopathy    a. 05/2020 Echo: EF 35-40%, glob HK, though basal wall motion best preserved consistent w/ stress induced CM. Gr1 DD. Nl RV size/fxn. Sm to mod circumferential pericardial effusion w/o tamponade.   Wears contact lenses    Wears hearing aid in left ear     Surgical History:  Past Surgical History:  Procedure Laterality Date   BREAST BIOPSY Right 05/04/2021   stereo x clip BENIGN BREAST TISSUE WITH MILD STROMAL FIBROSIS AND HEMORRHAGE   BREAST CYST ASPIRATION Right 05/01/2021    BREAST EXCISIONAL BIOPSY Left    age 22's   CARDIAC CATHETERIZATION     CATARACT EXTRACTION W/PHACO Left 04/22/2022   Procedure: CATARACT EXTRACTION PHACO AND INTRAOCULAR LENS PLACEMENT ([place];  Surgeon: KiEulogio BearMD;  [place] [facility]: Ophthalmology;  Laterality: Left;  1.42 00:19.9   CATARACT EXTRACTION W/PHACO Right 05/06/2022   Procedure: CATARACT EXTRACTION PHACO AND INTRAOCULAR LENS PLACEMENT ([facility]) RIGHT 1.83 00:25.4;  Surgeon: KiEulogio BearMD;  [place]: Ophthalmology;  Laterality: Right;   CHEST WALL RECONSTRUCTION Right 05/09/2021   Procedure: CHEST WALL MASS RESECTION;  Surgeon: LiLajuana MatteMD;  [place] Service: Thoracic;  Laterality: Right;  posterior  CYST EXCISION  x2   LEFT HEART CATH AND CORONARY ANGIOGRAPHY N/A 05/31/2020   Procedure: LEFT HEART CATH AND CORONARY ANGIOGRAPHY;  Surgeon: Nelva Bush, MD;  Location: [place] [facility];  Service: Cardiovascular;  Laterality: N/A;   LIPOMA EXCISION     PARATHYROIDECTOMY     TONSILLECTOMY      Medications:  Current Outpatient Medications on File Prior to Visit  Medication Sig   acetaminophen (TYLENOL) 500 MG tablet Take 1,000 mg by mouth daily as needed.   ANORO ELLIPTA 62.5-25 MCG/ACT AEPB INHALE 1 PUFF BY MOUTH EVERY DAY   cetirizine (ZYRTEC) 10 MG tablet Take 10 mg by mouth daily.   diphenhydrAMINE (BENADRYL) 25 MG tablet Take 25 mg by mouth at bedtime.   fluticasone (FLONASE) 50 MCG/ACT nasal spray Place 2 sprays into both nostrils daily.   Multiple Vitamins-Minerals (CENTRUM SILVER 50+WOMEN PO) Take 1 tablet by mouth daily at 6 (six) AM.   nitroGLYCERIN (NITROSTAT) 0.4 MG SL tablet Place 1 tablet (0.4 mg total) under the tongue every 5 (five) minutes as needed for chest pain.   PROAIR RESPICLICK 387 (90 Base) MCG/ACT AEPB INHALE 2 PUFFS INTO THE LUNGS EVERY 6 (SIX) HOURS AS NEEDED (SOB/WHEEZING).   Current  Facility-Administered Medications on File Prior to Visit  Medication   albuterol (PROVENTIL) (2.5 MG/3ML) 0.083% nebulizer solution 2.5 mg    Allergies:  Allergies  Allergen Reactions   Azithromycin Rash   Humira [Adalimumab] Rash   Tape Rash    Social History:  Social History   Socioeconomic History   Marital status: Married    Spouse name: Not on file   Number of children: Not on file   Years of education: Not on file   Highest education level: Not on file  Occupational History   Not on file  Tobacco Use   Smoking status: Former    Packs/day: 1.00    Years: 40.00    Total pack years: 40.00    Types: Cigarettes    Quit date: 09/18/2015    Years since quitting: 6.9   Smokeless tobacco: Never  Vaping Use   Vaping Use: Never used  Substance and Sexual Activity   Alcohol use: Yes    Alcohol/week: 6.0 standard drinks of alcohol    Types: 6 Standard drinks or equivalent per week    Comment: 2-3 glasses wine, 2-3x/wk   Drug use: Never   Sexual activity: Not on file  Other Topics Concern   Not on file  Social History Narrative   Not on file   Social Determinants of Health   Financial Resource Strain: Low Risk  (09/12/2021)   Overall Financial Resource Strain (CARDIA)    Difficulty of Paying Living Expenses: Not hard at all  Food Insecurity: No Food Insecurity (09/12/2021)   Hunger Vital Sign    Worried About Running Out of Food in the Last Year: Never true    Ran Out of Food in the Last Year: Never true  Transportation Needs: No Transportation Needs (09/12/2021)   PRAPARE - Hydrologist (Medical): No    Lack of Transportation (Non-Medical): No  Physical Activity: Sufficiently Active (09/12/2021)   Exercise Vital Sign    Days of Exercise per Week: 4 days    Minutes of Exercise per Session: 40 min  Stress: No Stress Concern Present (09/12/2021)   [place]    Feeling  of Stress : Only a little  Social Connections: Moderately Integrated (09/12/2021)   Social Connection and Isolation Panel [NHANES]    Frequency of Communication with Friends and Family: More than three times a week    Frequency of Social Gatherings with Friends and Family: More than three times a week    Attends Religious Services: More than 4 times per year    Active Member of [employer] or Organizations: No    Attends Archivist Meetings: Never    Marital Status: Married  Human resources officer Violence: Not At Risk (09/12/2021)   Humiliation, Afraid, Rape, and Kick questionnaire    Fear of Current or Ex-Partner: No    Emotionally Abused: No    Physically Abused: No    Sexually Abused: No   Social History   Tobacco Use  Smoking Status Former   Packs/day: 1.00   Years: 40.00   Total pack years: 40.00   Types: Cigarettes   Quit date: 09/18/2015   Years since quitting: 6.9  Smokeless Tobacco Never   Social History   Substance and Sexual Activity  Alcohol Use Yes   Alcohol/week: 6.0 standard drinks of alcohol   Types: 6 Standard drinks or equivalent per week   Comment: 2-3 glasses wine, 2-3x/wk    Family History:  Family History  Problem Relation Age of Onset   Heart attack Mother 69   COPD Mother    Stroke Mother    Diverticulitis Mother    Cancer Mother        breast   Hypertension Mother    Breast cancer Mother 79   Hypertension Father    Stroke Maternal Grandfather    Alzheimer's disease Paternal Grandmother    Heart attack Paternal Grandfather    Breast cancer Cousin        mat cousin x 2   Hypertension Brother     Past medical history, surgical history, medications, allergies, family history and social history reviewed with patient today and changes made to appropriate areas of the chart.   ROS All other ROS negative except what is listed above and in the HPI.      Objective:    BP 134/82   Pulse 73   Temp (!) 97.4 F (36.3 C) (Oral)   Ht 5' 5.98"  (1.676 m)   Wt 150 lb 9.6 oz (68.3 kg)   LMP 11/16/2007 (Approximate)   SpO2 99%   BMI 24.32 kg/m   Wt Readings from Last 3 Encounters:  09/13/22 150 lb 9.6 oz (68.3 kg)  05/06/22 148 lb (67.1 kg)  04/29/22 148 lb (67.1 kg)    Physical Exam Vitals and nursing note reviewed. Exam conducted with a chaperone present.  Constitutional:      General: She is awake. She is not in acute distress.    Appearance: She is well-developed. She is not ill-appearing.  HENT:     Head: Normocephalic and atraumatic.     Right Ear: Hearing, tympanic membrane, ear canal and external ear normal. No drainage.     Left Ear: Hearing, tympanic membrane, ear canal and external ear normal. No drainage.     Nose: Nose normal.     Right Sinus: No maxillary sinus tenderness or frontal sinus tenderness.     Left Sinus: No maxillary sinus tenderness or frontal sinus tenderness.     Mouth/Throat:     Mouth: Mucous membranes are moist.     Pharynx: Oropharynx is clear. Uvula midline. No pharyngeal swelling, oropharyngeal exudate or posterior oropharyngeal erythema.  Eyes:  General: Lids are normal.        Right eye: No discharge.        Left eye: No discharge.     Extraocular Movements: Extraocular movements intact.     Conjunctiva/sclera: Conjunctivae normal.     Pupils: Pupils are equal, round, and reactive to light.     Visual Fields: Right eye visual fields normal and left eye visual fields normal.  Neck:     Thyroid: No thyromegaly.     Vascular: No carotid bruit.     Trachea: Trachea normal.  Cardiovascular:     Rate and Rhythm: Normal rate and regular rhythm.     Heart sounds: Normal heart sounds. No murmur heard.    No gallop.  Pulmonary:     Effort: Pulmonary effort is normal. No accessory muscle usage or respiratory distress.     Breath sounds: Normal breath sounds.  Chest:  Breasts:    Right: Normal.     Left: Normal.  Abdominal:     General: Bowel sounds are normal.     Palpations:  Abdomen is soft. There is no hepatomegaly or splenomegaly.     Tenderness: There is no abdominal tenderness.  Musculoskeletal:        General: Normal range of motion.     Cervical back: Normal range of motion and neck supple.     Right lower leg: No edema.     Left lower leg: No edema.  Lymphadenopathy:     Head:     Right side of head: No submental, submandibular, tonsillar, preauricular or posterior auricular adenopathy.     Left side of head: No submental, submandibular, tonsillar, preauricular or posterior auricular adenopathy.     Cervical: No cervical adenopathy.     Upper Body:     Right upper body: No supraclavicular, axillary or pectoral adenopathy.     Left upper body: No supraclavicular, axillary or pectoral adenopathy.  Skin:    General: Skin is warm and dry.     Capillary Refill: Capillary refill takes less than 2 seconds.     Findings: No rash.  Neurological:     Mental Status: She is alert and oriented to person, place, and time.     Gait: Gait is intact.     Deep Tendon Reflexes: Reflexes are normal and symmetric.     Reflex Scores:      Brachioradialis reflexes are 2+ on the right side and 2+ on the left side.      Patellar reflexes are 2+ on the right side and 2+ on the left side. Psychiatric:        Attention and Perception: Attention normal.        Mood and Affect: Mood normal.        Speech: Speech normal.        Behavior: Behavior normal. Behavior is cooperative.        Thought Content: Thought content normal.        Judgment: Judgment normal.    Results for orders placed or performed in visit on 05/14/22  Pulmonary Function Test [facility] Only  Result Value Ref Range   FVC-Pre 3.17 L   FVC-%Pred-Pre 90 %   FVC-Post 3.05 L   FVC-%Pred-Post 87 %   FVC-%Change-Post -3 %   FEV1-Pre 2.20 L   FEV1-%Pred-Pre 81 %   FEV1-Post 1.91 L   FEV1-%Pred-Post 70 %   FEV1-%Change-Post -13 %   FEV6-Pre 3.12 L   FEV6-%Pred-Pre 92 %  FEV6-Post 2.98 L   FEV6-%Pred-Post  88 %   FEV6-%Change-Post -4 %   Pre FEV1/FVC ratio 69 %   FEV1FVC-%Pred-Pre 89 %   Post FEV1/FVC ratio 63 %   FEV1FVC-%Change-Post -9 %   Pre FEV6/FVC Ratio 98 %   FEV6FVC-%Pred-Pre 102 %   Post FEV6/FVC ratio 98 %   FEV6FVC-%Pred-Post 101 %   FEV6FVC-%Change-Post 0 %   FEF 25-75 Pre 1.37 L/sec   FEF2575-%Pred-Pre 57 %   FEF 25-75 Post 1.01 L/sec   FEF2575-%Pred-Post 42 %   FEF2575-%Change-Post -26 %   RV 3.03 L   RV % pred 142 %   TLC 6.09 L   TLC % pred 113 %   DLCO unc 13.34 ml/min/mmHg   DLCO unc % pred 62 %   DL/VA 2.62 ml/min/mmHg/L   DL/VA % pred 63 %      Assessment & Plan:   Problem List Items Addressed This Visit       Cardiovascular and Mediastinum   Coronary artery disease of native artery of native heart with stable angina pectoris (HCC)    Chronic, stable with no recent CP.  Continue Ticagrelor, ASA, and Atorvastatin for prevention + collaboration with cardiology.  High risk family and patient history.      Relevant Medications   atorvastatin (LIPITOR) 80 MG tablet   furosemide (LASIX) 20 MG tablet   losartan (COZAAR) 25 MG tablet   metoprolol succinate (TOPROL-XL) 25 MG 24 hr tablet   Other Relevant Orders   Comprehensive metabolic panel   Lipid Panel w/o Chol/HDL Ratio   Essential hypertension    Chronic, stable with BP at goal. Will continue current medication regimen and adjust as needed.  Continue collaboration with cardiology due to her significant family and personal cardiac history.  Recommend she monitor BP at least a few mornings a week at home and document.  DASH diet at home. Labs today: CMP, CBC, TSH, and lipid.  Return in 6 months.       Relevant Medications   atorvastatin (LIPITOR) 80 MG tablet   furosemide (LASIX) 20 MG tablet   losartan (COZAAR) 25 MG tablet   metoprolol succinate (TOPROL-XL) 25 MG 24 hr tablet   Other Relevant Orders   Comprehensive metabolic panel   Microalbumin, Urine Waived   Nonischemic cardiomyopathy (HCC)     Stable, followed by cardiology.  Continue current medication regimen and collaboration with cardiology.      Relevant Medications   atorvastatin (LIPITOR) 80 MG tablet   furosemide (LASIX) 20 MG tablet   losartan (COZAAR) 25 MG tablet   metoprolol succinate (TOPROL-XL) 25 MG 24 hr tablet   PAD (peripheral artery disease) (HCC)    Ongoing, followed by cardiology.  Continue current medication regimen and collaboration with cardiology.      Relevant Medications   atorvastatin (LIPITOR) 80 MG tablet   furosemide (LASIX) 20 MG tablet   losartan (COZAAR) 25 MG tablet   metoprolol succinate (TOPROL-XL) 25 MG 24 hr tablet   Other Relevant Orders   Comprehensive metabolic panel   Lipid Panel w/o Chol/HDL Ratio     Respiratory   Alpha-1-antitrypsin deficiency (HCC)    Chronic, followed by pulmonary -- will continue this collaboration.  Recent notes reviewed. Appreciate their input.      Relevant Orders   CBC with Differential/Platelet   Stage 2 moderate COPD by GOLD classification ([place]) - Primary    Chronic, stable. Continue current collaboration with pulmonary and inhaler regimen as ordered by  them.  Recommend continued cessation of smoking.      Relevant Orders   CBC with Differential/Platelet     Digestive   GERD without esophagitis    Chronic, ongoing.  Continue Protonix for GI protection.  Risks of PPI use were discussed with patient including bone loss, C. Diff diarrhea, pneumonia, infections, CKD, electrolyte abnormalities.  Verbalizes understanding and chooses to continue the medication.  Mag level today.       Relevant Medications   pantoprazole (PROTONIX) 40 MG tablet   Other Relevant Orders   Magnesium     Endocrine   Hashimoto's disease    Chronic, ongoing.  Continue current medication regimen and adjust as needed.  Thyroid labs today.      Relevant Medications   levothyroxine (SYNTHROID) 75 MCG tablet   metoprolol succinate (TOPROL-XL) 25 MG 24 hr tablet    Other Relevant Orders   TSH   T4, free     Genitourinary   CKD (chronic kidney disease) stage 3, GFR 30-59 ml/min (HCC)    Chronic, stable -- improved on recent labs.  Recheck CMP today and continue Losartan for kidney protection.  Consider referral to nephrology if significant decline noted.  Urine ALB 10 today (December 2023).      Relevant Orders   Comprehensive metabolic panel   Microalbumin, Urine Waived     Other   History of MI (myocardial infarction)    Continue collaboration with cardiology and current regimen as ordered by them.      History of parathyroidectomy ([place])    History of removal in 2018 and has been stable since, continue to monitor.      Hyperlipidemia LDL goal <70    Chronic, ongoing with history of MI.  Continue statin and adjust dose as needed. Could consider Rosuvastatin in future if any issues with Atorvastatin.  Lipid panel today, she is fasting.      Relevant Medications   atorvastatin (LIPITOR) 80 MG tablet   furosemide (LASIX) 20 MG tablet   losartan (COZAAR) 25 MG tablet   metoprolol succinate (TOPROL-XL) 25 MG 24 hr tablet   Other Relevant Orders   Comprehensive metabolic panel   Lipid Panel w/o Chol/HDL Ratio   Other Visit Diagnoses     Vitamin D deficiency       Relevant Orders   VITAMIN D 25 Hydroxy (Vit-D Deficiency, Fractures)   Encounter for annual physical exam       Annual physical today with labs and health maintenance reviewed, discussed with patient.        Follow up plan: Return in about 6 months (around 03/15/2023) for HTN/HLD, COPD, THYROID.   LABORATORY TESTING:  - Pap smear: up to date  IMMUNIZATIONS:   - Tdap: Tetanus vaccination status reviewed: last tetanus booster within 10 years. - Influenza: Up to date - Pneumovax: Not applicable - Prevnar: Not applicable - COVID: Up to date - HPV: Not applicable - Shingrix vaccine: got first one and needs to get second one = [facility]  SCREENING: -Mammogram: Up to  date last in August -- to return in 6 months - Colonoscopy: Up to date  - Bone Density: Not applicable  -Hearing Test: Not applicable  -Spirometry: Not applicable   PATIENT COUNSELING:   Advised to take 1 mg of folate supplement per day if capable of pregnancy.   Sexuality: Discussed sexually transmitted diseases, partner selection, use of condoms, avoidance of unintended pregnancy  and contraceptive alternatives.   Advised to avoid cigarette  smoking.  I discussed with the patient that most people either abstain from alcohol or drink within safe limits (<=14/week and <=4 drinks/occasion for males, <=7/weeks and <= 3 drinks/occasion for females) and that the risk for alcohol disorders and other health effects rises proportionally with the number of drinks per week and how often a drinker exceeds daily limits.  Discussed cessation/primary prevention of drug use and availability of treatment for abuse.   Diet: Encouraged to adjust caloric intake to maintain  or achieve ideal body weight, to reduce intake of dietary saturated fat and total fat, to limit sodium intake by avoiding high sodium foods and not adding table salt, and to maintain adequate dietary potassium and calcium preferably from fresh fruits, vegetables, and low-fat dairy products.    Stressed the importance of regular exercise  Injury prevention: Discussed safety belts, safety helmets, smoke detector, smoking near bedding or upholstery.   Dental health: Discussed importance of regular tooth brushing, flossing, and dental visits.    NEXT PREVENTATIVE PHYSICAL DUE IN 1 YEAR. Return in about 6 months (around 03/15/2023) for HTN/HLD, COPD, THYROID.

## 2022-09-13 NOTE — Assessment & Plan Note (Signed)
Chronic, ongoing.  Continue Protonix for GI protection.  Risks of PPI use were discussed with patient including bone loss, C. Diff diarrhea, pneumonia, infections, CKD, electrolyte abnormalities.  Verbalizes understanding and chooses to continue the medication.  Mag level today.

## 2022-09-13 NOTE — Assessment & Plan Note (Signed)
Chronic, stable with no recent CP.  Continue Ticagrelor, ASA, and Atorvastatin for prevention + collaboration with cardiology.  High risk family and patient history.

## 2022-09-13 NOTE — Assessment & Plan Note (Signed)
Chronic, stable with BP at goal. Will continue current medication regimen and adjust as needed.  Continue collaboration with cardiology due to her significant family and personal cardiac history.  Recommend she monitor BP at least a few mornings a week at home and document.  DASH diet at home. Labs today: CMP, CBC, TSH, and lipid.  Return in 6 months.

## 2022-09-13 NOTE — Assessment & Plan Note (Signed)
Ongoing, followed by cardiology.  Continue current medication regimen and collaboration with cardiology.

## 2022-09-14 LAB — CBC WITH DIFFERENTIAL/PLATELET
Basophils Absolute: 0.1 10*3/uL (ref 0.0–0.2)
Basos: 2 %
EOS (ABSOLUTE): 0.1 10*3/uL (ref 0.0–0.4)
Eos: 2 %
Hematocrit: 38.3 % (ref 34.0–46.6)
Hemoglobin: 13.1 g/dL (ref 11.1–15.9)
Immature Grans (Abs): 0 10*3/uL (ref 0.0–0.1)
Immature Granulocytes: 0 %
Lymphocytes Absolute: 1.9 10*3/uL (ref 0.7–3.1)
Lymphs: 30 %
MCH: 33.3 pg — ABNORMAL HIGH (ref 26.6–33.0)
MCHC: 34.2 g/dL (ref 31.5–35.7)
MCV: 98 fL — ABNORMAL HIGH (ref 79–97)
Monocytes Absolute: 0.4 10*3/uL (ref 0.1–0.9)
Monocytes: 7 %
Neutrophils Absolute: 3.7 10*3/uL (ref 1.4–7.0)
Neutrophils: 59 %
Platelets: 397 10*3/uL (ref 150–450)
RBC: 3.93 x10E6/uL (ref 3.77–5.28)
RDW: 13 % (ref 11.7–15.4)
WBC: 6.3 10*3/uL (ref 3.4–10.8)

## 2022-09-14 LAB — COMPREHENSIVE METABOLIC PANEL
ALT: 27 IU/L (ref 0–32)
AST: 27 IU/L (ref 0–40)
Albumin/Globulin Ratio: 2.3 — ABNORMAL HIGH (ref 1.2–2.2)
Albumin: 4.9 g/dL (ref 3.9–4.9)
Alkaline Phosphatase: 53 IU/L (ref 44–121)
BUN/Creatinine Ratio: 19 (ref 12–28)
BUN: 18 mg/dL (ref 8–27)
Bilirubin Total: 0.5 mg/dL (ref 0.0–1.2)
CO2: 26 mmol/L (ref 20–29)
Calcium: 10.1 mg/dL (ref 8.7–10.3)
Chloride: 101 mmol/L (ref 96–106)
Creatinine, Ser: 0.97 mg/dL (ref 0.57–1.00)
Globulin, Total: 2.1 g/dL (ref 1.5–4.5)
Glucose: 86 mg/dL (ref 70–99)
Potassium: 4.1 mmol/L (ref 3.5–5.2)
Sodium: 142 mmol/L (ref 134–144)
Total Protein: 7 g/dL (ref 6.0–8.5)
eGFR: 66 mL/min/{1.73_m2} (ref >59)

## 2022-09-14 LAB — LIPID PANEL W/O CHOL/HDL RATIO
Cholesterol, Total: 163 mg/dL (ref 100–199)
HDL: 83 mg/dL (ref >39)
LDL Chol Calc (NIH): 67 mg/dL (ref 0–99)
Triglycerides: 69 mg/dL (ref 0–149)
VLDL Cholesterol Cal: 13 mg/dL (ref 5–40)

## 2022-09-14 LAB — T4, FREE: Free T4: 1.44 ng/dL (ref 0.82–1.77)

## 2022-09-14 LAB — TSH: TSH: 3.42 u[IU]/mL (ref 0.450–4.500)

## 2022-09-14 LAB — MAGNESIUM: Magnesium: 2.3 mg/dL (ref 1.6–2.3)

## 2022-09-14 LAB — VITAMIN D 25 HYDROXY (VIT D DEFICIENCY, FRACTURES): Vit D, 25-Hydroxy: 35.3 ng/mL (ref 30.0–100.0)

## 2022-09-14 NOTE — Progress Notes (Signed)
Contacted via MyChart   Good evening April Raymond, your labs have returned: - Kidney function, creatinine and eGFR, remains normal, as is liver function, AST and ALT. I am taking kidney disease off of your problem list as for one year your levels have been stable. - Cholesterol levels are at goal. - Thyroid levels stable, continue current Levothyroxine dosing. - Remainder of labs all stable, no medication changes needed.  Any questions? Keep being amazing!!  Thank you for allowing me to participate in your care.  I appreciate you. Kindest regards, Elmo Rio

## 2022-11-04 ENCOUNTER — Other Ambulatory Visit: Payer: Self-pay | Admitting: Pulmonary Disease

## 2022-11-05 ENCOUNTER — Encounter: Payer: Self-pay | Admitting: Pulmonary Disease

## 2022-11-05 ENCOUNTER — Ambulatory Visit (INDEPENDENT_AMBULATORY_CARE_PROVIDER_SITE_OTHER): Payer: No Typology Code available for payment source | Admitting: Pulmonary Disease

## 2022-11-05 ENCOUNTER — Other Ambulatory Visit
Admission: RE | Admit: 2022-11-05 | Discharge: 2022-11-05 | Disposition: A | Discharge status: Home / Self Care | Payer: BC Managed Care – PPO | Source: Ambulatory Visit | Attending: Pulmonary Disease | Admitting: Pulmonary Disease

## 2022-11-05 VITALS — BP 114/72 | HR 71 | Temp 98.0°F | Ht 65.98 in | Wt 148.4 lb

## 2022-11-05 DIAGNOSIS — J449 Chronic obstructive pulmonary disease, unspecified: Secondary | ICD-10-CM

## 2022-11-05 DIAGNOSIS — E8801 Alpha-1-antitrypsin deficiency: Secondary | ICD-10-CM

## 2022-11-05 DIAGNOSIS — R052 Subacute cough: Secondary | ICD-10-CM | POA: Diagnosis not present

## 2022-11-05 DIAGNOSIS — R051 Acute cough: Secondary | ICD-10-CM

## 2022-11-05 LAB — NITRIC OXIDE: Nitric Oxide: 19

## 2022-11-05 MED ORDER — TRELEGY ELLIPTA 100-62.5-25 MCG/ACT IN AEPB: 1.0000 | INHALATION_SPRAY | Freq: Every day | RESPIRATORY_TRACT | 0 refills | Status: DC

## 2022-11-05 NOTE — Patient Instructions (Signed)
We have given you a trial of an inhaler called Trelegy Ellipta that is 1 puff daily.  This will replace the Anoro.  You only have a 2-week supply.  Let us know if this is better at controlling your shortness of breath and cough.  Know how you do with this inhaler.  Do not use the Anoro while on the Trelegy.  We have put in for level of the alpha-1, go by the lab to get your blood drawn.  We will see you in follow-up in 3 months time call sooner should any new problems arise.  We will call you the results of the alpha level as soon as we have those back.

## 2022-11-05 NOTE — Progress Notes (Unsigned)
Subjective:    Patient ID: April Raymond, female    DOB: [birth year], 62 y.o.   MRN: [id number] Patient Care Team: Venita Lick, NP as PCP - General (Nurse Practitioner) End, Harrell Gave, MD as PCP - Cardiology (Cardiology)  Chief Complaint  Patient presents with   Follow-up    Doing good. No SOB or wheezing. Dry cough at night.     HPI April Raymond is a 62 year old former smoker with a 40-pack-year history of smoking, alpha-1 MZ phenotype with mild deficiency and dyspnea who presents for follow-up.  This is a scheduled visit.  She was last seen on 29 April 2022.  For the details of that visit please refer to that note. She has been doing well on Anoro Ellipta which has helped relieve her symptoms of dyspnea.  However she has noted over the last several months that she has developed dry cough at nighttime but no perceived increased shortness of breath or wheezing.  She does not endorse any fevers, chills or sweats.  No chest pain. No orthopnea or paroxysmal nocturnal dyspnea.   We counseled her during her last visit with regards to implications of MZ phenotype and mildly decreased alpha-1 levels. Also discussed the implications with regards to children and grandchildren needed to be screened for the disease.  She states that none of her children or grandchildren have expressed desire to be tested.   She has not had any sputum production, hemoptysis, weight loss or anorexia.  No lower extremity edema.  No calf tenderness.  Overall she feels well and looks well.  DATA: 10/05/2020 2D echo: Small to moderate size pericardial effusion without hemodynamic compromise, LVEF 60 to 65%.  Indeterminate diastolics.  Normal RV function.  No valvular abnormality 10/17/2020 PFTs: FEV1 1.71 L or 62% predicted, FVC 2.73 L or 76% predicted, FEV1/FVC 62%, no bronchodilator response.  Diffusion capacity mildly reduced.  Consistent with moderate COPD. 12/04/2020 alpha-1-antitrypsin: Phenotype MZ, level 80  mg/dl. 04/29/2022 alpha 1 antitrypsin level: 87 mg/dL 05/14/2022 PFTs: FEV1 2.20 L or 81% predicted, FVC 3.17 L or 90% predicted, FEV1/FVC 69%, no bronchodilator response.  There is mild hyperinflation and moderate air trapping on lung volumes.  Diffusion capacity moderately reduced.  Compared to 17 October 2020 no significant change.  Study consistent with mild obstructive airways disease with moderate diffusion defect.  Review of Systems A 10 point review of systems was performed and it is as noted above otherwise negative.  Patient Active Problem List   Diagnosis Date Noted   Alpha-1-antitrypsin deficiency ([place]) 09/09/2022   GERD without esophagitis 09/09/2022   Spondylosis without myelopathy or radiculopathy, thoracic region 01/24/2022   Chronic anticoagulation ([place]) 01/17/2022   DDD (degenerative disc disease), thoracic 01/09/2022   Thoracic facet syndrome 01/09/2022   Cervical facet syndrome 01/09/2022   Cervicalgia 11/19/2021   DDD (degenerative disc disease), cervical 11/19/2021   Grade 1 Anterolisthesis of cervical spine (C7/T1) 11/19/2021   Grade 1 Anterolisthesis of thoracic spine (T2/T3) 11/19/2021   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   Chronic pain syndrome 11/17/2021   Chronic upper back pain (1ry area of Pain) (Right) 10/22/2021   Chronic thoracic back pain (Bilateral) (R>L) 10/22/2021   Chronic neck pain (2ry area of Pain) (Bilateral) (R>L) 10/22/2021   Chronic shoulder pain (3ry area of Pain) (Bilateral) 10/22/2021   Painful cervical range of motion 10/22/2021   Impaired range of motion of cervical spine 10/22/2021   Spasm of thoracic back muscle 10/22/2021   H/O lipoma 05/09/2021   Stage  2 moderate COPD by GOLD classification ([place]) 01/12/2021   PAD (peripheral artery disease) ([place]) 09/22/2020   Nonischemic cardiomyopathy ([place]) 05/31/2020   Former smoker 09/30/2019   History of MI (myocardial infarction) 09/30/2019   History of parathyroidectomy ([place])  09/30/2019   Coronary artery disease of native artery of native heart with stable angina pectoris ([place]) 09/30/2019   Essential hypertension 09/30/2019   Hyperlipidemia LDL goal <70 09/30/2019   Hashimoto's disease 09/30/2019   Vaginal atrophy 09/30/2019   Hidradenitis suppurativa 09/30/2019   Social History   Tobacco Use   Smoking status: Former    Packs/day: 1.00    Years: 40.00    Total pack years: 40.00    Types: Cigarettes    Quit date: 09/18/2015    Years since quitting: 7.1   Smokeless tobacco: Never  Substance Use Topics   Alcohol use: Yes    Alcohol/week: 6.0 standard drinks of alcohol    Types: 6 Standard drinks or equivalent per week    Comment: 2-3 glasses wine, 2-3x/wk   Allergies  Allergen Reactions   Azithromycin Rash   Humira [Adalimumab] Rash   Tape Rash   Current Meds  Medication Sig   acetaminophen (TYLENOL) 500 MG tablet Take 1,000 mg by mouth daily as needed.   ANORO ELLIPTA 62.5-25 MCG/ACT AEPB INHALE 1 PUFF BY MOUTH EVERY DAY   atorvastatin (LIPITOR) 80 MG tablet Take 1 tablet (80 mg total) by mouth daily.   cetirizine (ZYRTEC) 10 MG tablet Take 10 mg by mouth daily.   conjugated estrogens (PREMARIN) vaginal cream PLACE 0.5 GRAMS TWICE WEEKLY INTRAVAGINALLY.   diphenhydrAMINE (BENADRYL) 25 MG tablet Take 25 mg by mouth at bedtime.   fluticasone (FLONASE) 50 MCG/ACT nasal spray Place 2 sprays into both nostrils daily.   Fluticasone-Umeclidin-Vilant (TRELEGY ELLIPTA) 100-62.5-25 MCG/ACT AEPB Inhale 1 puff into the lungs daily.   furosemide (LASIX) 20 MG tablet Take 1 tablet (20 mg total) by mouth daily as needed.   levothyroxine (SYNTHROID) 75 MCG tablet TAKE 1 TABLET BY MOUTH EVERY DAY BEFORE BREAKFAST   losartan (COZAAR) 25 MG tablet Take 0.5 tablets (12.5 mg total) by mouth daily.   metoprolol succinate (TOPROL-XL) 25 MG 24 hr tablet Take 0.5 tablets (12.5 mg total) by mouth daily.   Multiple Vitamins-Minerals (CENTRUM SILVER 50+WOMEN PO) Take 1  tablet by mouth daily at 6 (six) AM.   pantoprazole (PROTONIX) 40 MG tablet Take 1 tablet (40 mg total) by mouth daily.   PROAIR RESPICLICK 123XX123 (90 Base) MCG/ACT AEPB INHALE 2 PUFFS INTO THE LUNGS EVERY 6 (SIX) HOURS AS NEEDED (SOB/WHEEZING).   ticagrelor (BRILINTA) 60 MG TABS tablet TAKE 1 TABLET (60 MG TOTAL) BY MOUTH 2 (TWO) TIMES DAILY.   Immunization History  Administered Date(s) Administered   Influenza,inj,Quad PF,6+ Mos 05/19/2020   Influenza-Unspecified 07/19/2019, 06/22/2021, 06/12/2022   Moderna SARS-COV2 Booster Vaccination 01/12/2021   Moderna Sars-Covid-2 Vaccination 12/16/2019, 01/13/2020, 08/08/2020, 06/22/2021   Tdap 03/21/2020       Objective:   Physical Exam BP 114/72 (BP Location: Left Arm, Cuff Size: Normal)   Pulse 71   Temp 98 F (36.7 C)   Ht 5' 5.98" (1.676 m)   Wt 148 lb 6.4 oz (67.3 kg)   LMP 11/16/2007 (Approximate)   SpO2 100%   BMI 23.97 kg/m   SpO2: 100 % O2 Device: None (Room air)  GENERAL: Well-developed, well-nourished woman in no acute distress.  Fully ambulatory.  No conversational dyspnea. HEAD: Normocephalic, atraumatic.  EYES: Pupils equal, round, reactive  to light.  No scleral icterus.  MOUTH: Nose/mouth/throat not examined due to masking requirements for COVID 19. NECK: Supple. No thyromegaly. Trachea midline. No JVD.  No adenopathy. PULMONARY: Good air entry bilaterally.  Mild end expiratory wheezes noted. CARDIOVASCULAR: S1 and S2. Regular rate and rhythm.  No rubs, murmurs or gallops heard. ABDOMEN: Benign. MUSCULOSKELETAL: No joint deformity, no clubbing, no edema.  NEUROLOGIC: No focal deficit, no gait disturbance, speech is fluent. SKIN: Intact,warm,dry.  No rashes. PSYCH: Mood and behavior normal.  Lab Results  Component Value Date   NITRICOXIDE 19 11/05/2022        Assessment & Plan:     ICD-10-CM   1. Heterozygous alpha 1-antitrypsin deficiency (HCC)  E88.01 Alpha-1-antitrypsin   Levels mildly decreased Lung  function stable Recheck alpha-1 level    2. Stage 2 moderate COPD by GOLD classification ([place])  J44.9 Alpha-1-antitrypsin    CANCELED: Alpha-1-antitrypsin   Noted to have wheezing today Nitric oxide normal Hold Anoro, trial of Trelegy Ellipta    3. Subacute cough  R05.2 Nitric oxide   Likely due to bronchospasm Trial of Trelegy as above     Orders Placed This Encounter  Procedures   Alpha-1-antitrypsin    Standing Status:   Future    Number of Occurrences:   1    Standing Expiration Date:   05/06/2023   Nitric oxide   Meds ordered this encounter  Medications   Fluticasone-Umeclidin-Vilant (TRELEGY ELLIPTA) 100-62.5-25 MCG/ACT AEPB    Sig: Inhale 1 puff into the lungs daily.    Dispense:  14 each    Refill:  0    Order Specific Question:   Lot Number?    Answer:   [id number]    Order Specific Question:   Expiration Date?    Answer:   02/15/2024    Order Specific Question:   Quantity    Answer:   1   Patient was noted to have some bronchospasm today.  Will give her a trial of Trelegy Ellipta to see if this improves on this issue.  We will recheck alpha-1 antitrypsin levels.  We will see her in follow-up in 3 months time she is to contact us prior to that time should any new difficulties arise.  Renold Don, MD Advanced Bronchoscopy PCCM [facility]    *This note was dictated using voice recognition software/Dragon.  Despite best efforts to proofread, errors can occur which can change the meaning. Any transcriptional errors that result from this process are unintentional and may not be fully corrected at the time of dictation.

## 2022-11-06 ENCOUNTER — Encounter: Payer: Self-pay | Admitting: Pulmonary Disease

## 2022-11-06 LAB — ALPHA-1-ANTITRYPSIN: A-1 Antitrypsin, Ser: 89 mg/dL — ABNORMAL LOW (ref 101–187)

## 2022-11-11 ENCOUNTER — Encounter: Payer: Self-pay | Admitting: Pulmonary Disease

## 2022-11-11 MED ORDER — TRELEGY ELLIPTA 100-62.5-25 MCG/ACT IN AEPB: 1.0000 | INHALATION_SPRAY | Freq: Every day | RESPIRATORY_TRACT | 3 refills | Status: DC

## 2022-11-11 NOTE — Addendum Note (Signed)
Addended by: Claudette Head A on: 11/11/2022 04:02 PM   Modules accepted: Orders

## 2022-11-20 ENCOUNTER — Telehealth: Payer: Self-pay | Admitting: Internal Medicine

## 2022-11-20 NOTE — Telephone Encounter (Signed)
Pt c/o of Chest Pain: STAT if CP now or developed within 24 hours  1. Are you having CP right now? Yes -- Pain in upper back and left arm.   2. Are you experiencing any other symptoms (ex. SOB, nausea, vomiting, sweating)? No   3. How long have you been experiencing CP? 2 days   4. Is your CP continuous or coming and going? Continuous   5. Have you taken Nitroglycerin? No  ?

## 2022-11-20 NOTE — Telephone Encounter (Signed)
Patient called and stated that she was having left shoulder and back pain. "If it were on the right side I wouldn't be calling." Patient denies dyspnea, nausea, vomiting, fever, chills, and uncontrolled pain. Patient states pain is steady at 5 and doesn't get better or worse with rest or exertion respectively. Patient stated she has had a myocardial infarction in the past and was nervous about this pain. Instructed patient to present to emergency department if symptoms persist or worsen but patient wanted to be seen in clinic. Patient made appointment Friday 11/22/22 at 8:40

## 2022-11-20 NOTE — Telephone Encounter (Signed)
The amount of steroid in the Trelegy is not enough to cause systemic symptoms.  However if she feels that this is part of her issues we can certainly resume the Anoro and discontinue the Trelegy.

## 2022-11-22 ENCOUNTER — Other Ambulatory Visit
Admission: RE | Admit: 2022-11-22 | Discharge: 2022-11-22 | Disposition: A | Discharge status: Home / Self Care | Payer: BLUE CROSS/BLUE SHIELD | Source: Ambulatory Visit | Attending: Cardiovascular Disease | Admitting: Cardiovascular Disease

## 2022-11-22 ENCOUNTER — Ambulatory Visit: Payer: BLUE CROSS/BLUE SHIELD | Attending: Cardiovascular Disease | Admitting: Cardiovascular Disease

## 2022-11-22 ENCOUNTER — Encounter: Payer: Self-pay | Admitting: Cardiovascular Disease

## 2022-11-22 VITALS — BP 122/70 | HR 73 | Ht 66.0 in | Wt 147.0 lb

## 2022-11-22 DIAGNOSIS — I5181 Takotsubo syndrome: Secondary | ICD-10-CM | POA: Diagnosis not present

## 2022-11-22 DIAGNOSIS — E785 Hyperlipidemia, unspecified: Secondary | ICD-10-CM | POA: Diagnosis not present

## 2022-11-22 DIAGNOSIS — I739 Peripheral vascular disease, unspecified: Secondary | ICD-10-CM | POA: Diagnosis not present

## 2022-11-22 DIAGNOSIS — R079 Chest pain, unspecified: Secondary | ICD-10-CM

## 2022-11-22 LAB — TROPONIN I (HIGH SENSITIVITY): Troponin I (High Sensitivity): 9 ng/L (ref <18)

## 2022-11-22 NOTE — Patient Instructions (Signed)
Medication Instructions:  No changes *If you need a refill on your cardiac medications before your next appointment, please call your pharmacy*   Lab Work: Your provider would like for you to have following labs drawn: Troponin.   Please go to the [facility] entrance and check in at the front desk.  You do not need an appointment.  They are open from 7am-6 pm.    If you have labs (blood work) drawn today and your tests are completely normal, you will receive your results only by: [place] (if you have MyChart) OR A paper copy in the mail If you have any lab test that is abnormal or we need to change your treatment, we will call you to review the results.   Testing/Procedures: Your physician has requested that you have an echocardiogram. Echocardiography is a painless test that uses sound waves to create images of your heart. It provides your doctor with information about the size and shape of your heart and how well your heart's chambers and valves are working.   You may receive an ultrasound enhancing agent through an IV if needed to better visualize your heart during the echo. This procedure takes approximately one hour.  There are no restrictions for this procedure.  This will take place at [facility] ([id number], [place]    Follow-Up: At [facility], you and your health needs are our priority.  As part of our continuing mission to provide you with exceptional heart care, we have created designated Provider Care Teams.  These Care Teams include your primary Cardiologist (physician) and Advanced Practice Providers (APPs -  Physician Assistants and Nurse Practitioners) who all work together to provide you with the care you need, when you need it.  We recommend signing up for the patient portal called "MyChart".  Sign up information is provided on this After Visit Summary.  MyChart is used to connect with patients for Virtual  Visits (Telemedicine).  Patients are able to view lab/test results, encounter notes, upcoming appointments, etc.  Non-urgent messages can be sent to your provider as well.   To learn more about what you can do with MyChart, go to [email or web address].    Your next appointment:   2 month(s)  Provider:   You may see Nelva Bush, MD or one of the following Advanced Practice Providers on your designated Care Team:   Murray Hodgkins, NP Christell Faith, PA-C Cadence Kathlen Mody, PA-C Gerrie Nordmann, NP

## 2022-11-22 NOTE — Progress Notes (Signed)
Cardiology Office Note   Date:  11/22/2022   ID:  April Raymond, DOB 04-07-1961, MRN [id number]  PCP:  Venita Lick, NP  Cardiologist:  Dr. Saunders Revel  Chief Complaint  Patient presents with   Left arm & shoulder pain    Patient c/o left arm and shoulder pain x 2 days. Medications reviewed by the patient verbally.       History of Present Illness: April Raymond is a 62 y.o. female who was added to my schedule for evaluation of chest pain.  She has known history of coronary artery disease, COPD, nonischemic cardiomyopathy, AVMs, previous pericardial effusion, iron deficiency anemia, Hashimoto's disease, peripheral arterial disease and essential hypertension. She had myocardial infarction in 2017 with RCA stents placement and stress-induced cardiomyopathy in 2021. She had an abnormal treadmill stress test done in December 2021 and was under significant stress at that time.  She underwent cardiac catheterization the next day which showed mild to moderate nonobstructive coronary artery disease with patent RCA stent.  EF was severely reduced with wall motion abnormality consistent with stress-induced cardiomyopathy.  Her EF subsequently normalized.  Most recent echocardiogram in January 2022 showed normal LV systolic function with small to moderate pericardial effusion. She called our office yesterday due to left upper arm and shoulder pain that has been persistent for 2 days.  The pain is continuous.  She did have arm pain with her previous myocardial infarction but that was also associated with chest pain at that time which she does not have currently.  Past Medical History:  Diagnosis Date   Alpha-1-antitrypsin deficiency carrier 02/20/2021   MZ phenotype   Anginal pain ([place])    CAD (coronary artery disease)    a. 2017 s/p prior MI & PCI RCA ([place]); b. 05/2020 Abnl ETT: 1.5-69m horizontal ST dep in inflat leads @ peak stress w/ freq PVCs/couplets/bigeminy. HTN response; b. 05/2020  NSTEMI/Cath: LM nl, LAD 438mLCX 20, RCA 35ost, patent prev placed prox-dist stent. EF 25-35% w/ apical ballooning.   COPD (chronic obstructive pulmonary disease) ([place]   Dental bridge present    Permanent lower   Hashimoto's disease    Hidradenitis suppurativa    Hydradenitis    Hyperparathyroidism ([place]   Hypertension    Myocardial infarction ([place]   Pericardial effusion    a. 05/2020 Echo: sm to mod circumferential peric eff w/o tamponade; b. 07/2020 Mod peric effusion; c. 09/2020 Echo: EF 60-65%, no rwma, small tomod effusion w/o compromise.   Raynaud's syndrome    [facility]   Takotsubo cardiomyopathy    a. 05/2020 Echo: EF 35-40%, glob HK, though basal wall motion best preserved consistent w/ stress induced CM. Gr1 DD. Nl RV size/fxn. Sm to mod circumferential pericardial effusion w/o tamponade.   Wears contact lenses    Wears hearing aid in left ear     Past Surgical History:  Procedure Laterality Date   BREAST BIOPSY Right 05/04/2021   stereo x clip BENIGN BREAST TISSUE WITH MILD STROMAL FIBROSIS AND HEMORRHAGE   BREAST CYST ASPIRATION Right 05/01/2021   BREAST EXCISIONAL BIOPSY Left    age 48's   CARDIAC CATHETERIZATION     CATARACT EXTRACTION W/PHACO Left 04/22/2022   Procedure: CATARACT EXTRACTION PHACO AND INTRAOCULAR LENS PLACEMENT (IOLoupLEFT;  Surgeon: KiEulogio BearMD;  [place] Service: Ophthalmology;  Laterality: Left;  1.42 00:19.9   CATARACT EXTRACTION W/PHACO Right 05/06/2022   Procedure: CATARACT EXTRACTION  PHACO AND INTRAOCULAR LENS PLACEMENT (IOC) RIGHT 1.83 00:25.4;  Surgeon: Eulogio Bear, MD;  Location: [facility];  Service: Ophthalmology;  Laterality: Right;   CHEST WALL RECONSTRUCTION Right 05/09/2021   Procedure: CHEST WALL MASS RESECTION;  Surgeon: [name], MD;  Location: [facility];  Service: Thoracic;  Laterality: Right;  posterior   CYST EXCISION  x2   LEFT HEART CATH AND CORONARY ANGIOGRAPHY  N/A 05/31/2020   Procedure: LEFT HEART CATH AND CORONARY ANGIOGRAPHY;  Surgeon: Nelva Bush, MD;  Location: [facility];  Service: Cardiovascular;  Laterality: N/A;   LIPOMA EXCISION     PARATHYROIDECTOMY     TONSILLECTOMY       Current Outpatient Medications  Medication Sig Dispense Refill   acetaminophen (TYLENOL) 500 MG tablet Take 1,000 mg by mouth daily as needed.     atorvastatin (LIPITOR) 80 MG tablet Take 1 tablet (80 mg total) by mouth daily. 90 tablet 4   cetirizine (ZYRTEC) 10 MG tablet Take 10 mg by mouth daily.     conjugated estrogens (PREMARIN) vaginal cream PLACE 0.5 GRAMS TWICE WEEKLY INTRAVAGINALLY. 30 g 12   diphenhydrAMINE (BENADRYL) 25 MG tablet Take 25 mg by mouth at bedtime.     fluticasone (FLONASE) 50 MCG/ACT nasal spray Place 2 sprays into both nostrils daily.     furosemide (LASIX) 20 MG tablet Take 1 tablet (20 mg total) by mouth daily as needed. 90 tablet 4   levothyroxine (SYNTHROID) 75 MCG tablet TAKE 1 TABLET BY MOUTH EVERY DAY BEFORE BREAKFAST 90 tablet 4   losartan (COZAAR) 25 MG tablet Take 0.5 tablets (12.5 mg total) by mouth daily. 45 tablet 4   metoprolol succinate (TOPROL-XL) 25 MG 24 hr tablet Take 0.5 tablets (12.5 mg total) by mouth daily. 45 tablet 4   Multiple Vitamins-Minerals (CENTRUM SILVER 50+WOMEN PO) Take 1 tablet by mouth daily at 6 (six) AM.     nitroGLYCERIN (NITROSTAT) 0.4 MG SL tablet Place 1 tablet (0.4 mg total) under the tongue every 5 (five) minutes as needed for chest pain. 25 tablet 3   pantoprazole (PROTONIX) 40 MG tablet Take 1 tablet (40 mg total) by mouth daily. 90 tablet 4   PROAIR RESPICLICK 123XX123 (90 Base) MCG/ACT AEPB INHALE 2 PUFFS INTO THE LUNGS EVERY 6 (SIX) HOURS AS NEEDED (SOB/WHEEZING). 1 each 0   ticagrelor (BRILINTA) 60 MG TABS tablet TAKE 1 TABLET (60 MG TOTAL) BY MOUTH 2 (TWO) TIMES DAILY. 180 tablet 4   umeclidinium-vilanterol (ANORO ELLIPTA) 62.5-25 MCG/ACT AEPB Inhale 1 puff into the lungs daily.      No current facility-administered medications for this visit.   Facility-Administered Medications Ordered in Other Visits  Medication Dose Route Frequency Provider Last Rate Last Admin   albuterol (PROVENTIL) (2.5 MG/3ML) 0.083% nebulizer solution 2.5 mg  2.5 mg Nebulization Once Tyler Pita, MD        Allergies:   Azithromycin, Humira [adalimumab], and Tape    Social History:  The patient  reports that she quit smoking about 7 years ago. Her smoking use included cigarettes. She has a 40.00 pack-year smoking history. She has never used smokeless tobacco. She reports current alcohol use of about 6.0 standard drinks of alcohol per week. She reports that she does not use drugs.   Family History:  The patient's family history includes Alzheimer's disease in her paternal grandmother; Breast cancer in her cousin; Breast cancer (age of onset: 48) in her mother; COPD in her mother; Cancer in  her mother; Diverticulitis in her mother; Heart attack in her paternal grandfather; Heart attack (age of onset: 80) in her mother; Hypertension in her brother, father, and mother; Stroke in her maternal grandfather and mother.    ROS:  Please see the history of present illness.   Otherwise, review of systems are positive for none.   All other systems are reviewed and negative.    PHYSICAL EXAM: VS:  BP 122/70 (BP Location: Left Arm, Patient Position: Sitting, Cuff Size: Normal)   Pulse 73   Ht '5\' 6"'$  (1.676 m)   Wt 147 lb (66.7 kg)   LMP 11/16/2007 (Approximate)   SpO2 98%   BMI 23.73 kg/m  , BMI Body mass index is 23.73 kg/m. GEN: Well nourished, well developed, in no acute distress  HEENT: normal  Neck: no JVD, carotid bruits, or masses Cardiac: RRR; no murmurs, rubs, or gallops,no edema  Respiratory:  clear to auscultation bilaterally, normal work of breathing GI: soft, nontender, nondistended, + BS MS: no deformity or atrophy  Skin: warm and dry, no rash Neuro:  Strength and sensation are  intact Psych: euthymic mood, full affect   EKG:  EKG is ordered today. The ekg ordered today demonstrates normal sinus rhythm with low voltage and possible old septal infarct.   Recent Labs: 09/13/2022: ALT 27; BUN 18; Creatinine, Ser 0.97; Hemoglobin 13.1; Magnesium 2.3; Platelets 397; Potassium 4.1; Sodium 142; TSH 3.420    Lipid Panel    Component Value Date/Time   CHOL 163 09/13/2022 1025   TRIG 69 09/13/2022 1025   HDL 83 09/13/2022 1025   LDLCALC 67 09/13/2022 1025      Wt Readings from Last 3 Encounters:  11/22/22 147 lb (66.7 kg)  11/05/22 148 lb 6.4 oz (67.3 kg)  09/13/22 150 lb 9.6 oz (68.3 kg)          No data to display            ASSESSMENT AND PLAN:  1.  Coronary artery disease involving native coronary arteries with atypical chest pain: I suspect that her symptoms are likely musculoskeletal but she reports no triggering event.  Will check a troponin given her cardiac history.  If troponin is negative, no need for ischemic cardiac evaluation.  2.  History of stress-induced cardiomyopathy and pericardial effusion: I requested a follow-up echocardiogram.  3.  Peripheral arterial disease: No claudication.  4.  Hyperlipidemia: Currently on atorvastatin 80 mg daily.  Most recent lipid profile showed an LDL of 67.    Disposition:   FU with Dr. Saunders Revel in 2 months.  Signed,  Kathlyn Sacramento, MD  11/22/2022 10:58 AM    [place]

## 2022-11-25 ENCOUNTER — Ambulatory Visit (INDEPENDENT_AMBULATORY_CARE_PROVIDER_SITE_OTHER): Payer: No Typology Code available for payment source

## 2022-11-25 ENCOUNTER — Encounter: Payer: Self-pay | Admitting: Podiatry

## 2022-11-25 ENCOUNTER — Ambulatory Visit: Payer: No Typology Code available for payment source | Admitting: Podiatry

## 2022-11-25 VITALS — BP 124/67 | HR 70

## 2022-11-25 DIAGNOSIS — Q828 Other specified congenital malformations of skin: Secondary | ICD-10-CM

## 2022-11-25 DIAGNOSIS — I739 Peripheral vascular disease, unspecified: Secondary | ICD-10-CM | POA: Diagnosis not present

## 2022-11-25 DIAGNOSIS — M216X2 Other acquired deformities of left foot: Secondary | ICD-10-CM

## 2022-11-25 NOTE — Patient Instructions (Signed)
Look for salicylic acid [id number] medicated pads, cream or ointment and apply to the thickened dry skin / calluses. This can be bought over the counter, at a pharmacy or online such as [employer]. Use at night  Dancer's pads can be bough on [employer] as well to take pressure off the bone and skin lesion. If these are helpful, long-term, I can make an orthotic with an offloaded area here to keep pressure off the bone   For the ingrown nail if it continues to be painful or bothersome and you would like to have the ingrown part removed permanently please let me know

## 2022-11-26 NOTE — Progress Notes (Signed)
  Subjective:  Patient ID: April Raymond, female    DOB: 1961/04/12,  MRN: [id number]  Chief Complaint  Patient presents with   Foot Pain    "I have a sore place on the bottom of my left foot.  It will not go away.  I've tried all the stuff for corns, like corn removal and pads. It's been at least a year.  I have a place on the tip of my toe on the right foot, 2nd digit.  I noticed it yesterday morning.  I have an ingrown toenail, big toe on the left foot."    62 y.o. female presents with the above complaint. History confirmed with patient.   Objective:  Physical Exam: warm, good capillary refill, no trophic changes or ulcerative lesions, normal sensory exam, and DP and PT pulses weakly palpable, porokeratosis submetatarsal 5, she has a tailor's bunion deformity, thickening of nail plate of left great toe, no active deep ingrown or paronychia.   Radiographs: Multiple views x-ray of the left foot: no fracture, dislocation, swelling or degenerative changes noted and tailor's bunion deformity noted Assessment:   1. Plantar flexed metatarsal bone of left foot   2. Porokeratosis   3. PAD (peripheral artery disease) ([place])      Plan:  Patient was evaluated and treated and all questions answered.   All symptomatic hyperkeratoses were safely debrided with a sterile #15 blade to patient's level of comfort without incident. We discussed preventative and palliative care of these lesions including supportive and accommodative shoegear, padding, prefabricated and custom molded accommodative orthoses, use of a pumice stone and lotions/creams daily.  We discussed use of salicylic acid 13% pads to use nightly until this lesion goes away.  She does have a tailor's bunion deformity but her pain was not consistent with bony pain or bursitis.  Discussed the option of surgical correction of this if this develops.  Dancers pads were dispensed and she will use these for offloading.  Discussed the possibility of  a custom molded orthosis that would aggravate this as well.  Her ingrown nail was mild and did not have any evidence of deep ingrown or infection.  She will return to see me if this worsens  Return if symptoms worsen or fail to improve.

## 2022-12-13 ENCOUNTER — Ambulatory Visit
Admission: RE | Admit: 2022-12-13 | Discharge: 2022-12-13 | Disposition: A | Discharge status: Home / Self Care | Payer: BLUE CROSS/BLUE SHIELD | Source: Ambulatory Visit | Attending: Cardiovascular Disease | Admitting: Cardiovascular Disease

## 2022-12-13 DIAGNOSIS — I5181 Takotsubo syndrome: Secondary | ICD-10-CM | POA: Insufficient documentation

## 2022-12-13 DIAGNOSIS — I2510 Atherosclerotic heart disease of native coronary artery without angina pectoris: Secondary | ICD-10-CM | POA: Insufficient documentation

## 2022-12-13 DIAGNOSIS — R079 Chest pain, unspecified: Secondary | ICD-10-CM | POA: Diagnosis present

## 2022-12-13 DIAGNOSIS — I252 Old myocardial infarction: Secondary | ICD-10-CM | POA: Insufficient documentation

## 2022-12-13 DIAGNOSIS — I10 Essential (primary) hypertension: Secondary | ICD-10-CM | POA: Insufficient documentation

## 2022-12-13 LAB — ECHOCARDIOGRAM COMPLETE
AR max vel: 2.16 cm2
AV Area VTI: 2.22 cm2
AV Area mean vel: 2.23 cm2
AV Mean grad: 3 mmHg
AV Peak grad: 5.3 mmHg
Ao pk vel: 1.15 m/s
Area-P 1/2: 3.72 cm2
MV VTI: 2.05 cm2
S' Lateral: 2.6 cm

## 2022-12-13 NOTE — Progress Notes (Signed)
*  PRELIMINARY RESULTS* Echocardiogram 2D Echocardiogram has been performed.  April Raymond 12/13/2022, 9:47 AM

## 2022-12-27 ENCOUNTER — Ambulatory Visit: Payer: No Typology Code available for payment source | Admitting: Internal Medicine

## 2023-01-31 ENCOUNTER — Encounter: Payer: Self-pay | Admitting: Internal Medicine

## 2023-01-31 ENCOUNTER — Ambulatory Visit: Payer: BLUE CROSS/BLUE SHIELD | Attending: Internal Medicine | Admitting: Internal Medicine

## 2023-01-31 VITALS — BP 126/80 | HR 79 | Ht 66.0 in | Wt 146.0 lb

## 2023-01-31 DIAGNOSIS — I2510 Atherosclerotic heart disease of native coronary artery without angina pectoris: Secondary | ICD-10-CM | POA: Diagnosis not present

## 2023-01-31 DIAGNOSIS — I3139 Other pericardial effusion (noninflammatory): Secondary | ICD-10-CM

## 2023-01-31 DIAGNOSIS — I5181 Takotsubo syndrome: Secondary | ICD-10-CM | POA: Diagnosis not present

## 2023-01-31 DIAGNOSIS — I739 Peripheral vascular disease, unspecified: Secondary | ICD-10-CM

## 2023-01-31 NOTE — Progress Notes (Unsigned)
Follow-up Outpatient Visit Date: 01/31/2023  Primary Care Provider: Marjie Skiff, NP [address] [place] [address]  Chief Complaint: Follow-up chest pain  HPI:  April Raymond is a 62 y.o. 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  She was last seen in our office in March by Dr. Kirke Corin for urgent evaluation of chest pain.  She reported a 2-day history of upper arm and shoulder pain.  It was felt that her symptoms were musculoskeletal.  Subsequent echo showed normal LVEF with small to moderate pericardial effusion.  Today, April Raymond reports that she is feeling well.  Left shoulder/arm pain resolved spontaneously after a few weeks and has not returned.  She denies chest pain.  Chronic exertional dyspnea is unchanged and improves after using her inhaler.  She denies palpitations, lightheadedness, and edema.  She exercises regularly but is frustrated by lack of weight loss.  --------------------------------------------------------------------------------------------------  Past Medical History:  Diagnosis Date   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 2017 s/p prior MI & PCI RCA ([place]); b. 05/2020 Abnl ETT: 1.5-20mm horizontal ST dep in inflat leads @ peak stress w/ freq PVCs/couplets/bigeminy. HTN response; b. 05/2020 NSTEMI/Cath: LM nl, LAD 35m, LCX 20, RCA 35ost, patent prev placed prox-dist stent. EF 25-35% w/ apical ballooning.   COPD (chronic obstructive pulmonary disease) (HCC)    Dental bridge present     Permanent lower   Hashimoto's disease    Hidradenitis suppurativa    Hydradenitis    Hyperparathyroidism (HCC)    Hypertension    Myocardial infarction (HCC) 2017   Pericardial effusion    a. 05/2020 Echo: sm to mod circumferential peric eff w/o tamponade; b. 07/2020 Mod peric effusion; c. 09/2020 Echo: EF 60-65%, no rwma, small tomod effusion w/o compromise.   Raynaud's syndrome    Stroke [employer])    Takotsubo cardiomyopathy    a. 05/2020 Echo: EF 35-40%, glob HK, though basal wall motion best preserved consistent w/ stress induced CM. Gr1 DD. Nl RV size/fxn. Sm to mod circumferential pericardial effusion w/o tamponade.   Wears contact lenses    Wears hearing aid in left ear    Past Surgical History:  Procedure Laterality Date   BREAST BIOPSY Right 05/04/2021   stereo x clip BENIGN BREAST TISSUE WITH MILD STROMAL FIBROSIS AND HEMORRHAGE   BREAST CYST ASPIRATION Right 05/01/2021   BREAST EXCISIONAL BIOPSY Left    age 53's   CARDIAC CATHETERIZATION     CATARACT EXTRACTION W/PHACO Left 04/22/2022   Procedure: CATARACT EXTRACTION PHACO AND INTRAOCULAR LENS PLACEMENT (IOC) LEFT;  Surgeon: Nevada Crane, MD;  Location[facility];  Service: Ophthalmology;  Laterality: Left;  1.42 00:19.9   CATARACT EXTRACTION W/PHACO Right 05/06/2022   Procedure: CATARACT EXTRACTION PHACO AND INTRAOCULAR LENS PLACEMENT (IOC) RIGHT 1.83 00:25.4;  Surgeon: Nevada Crane, MD;  Location: [facility];  Service: Ophthalmology;  Laterality: Right;   CHEST WALL RECONSTRUCTION Right 05/09/2021   Procedure: CHEST WALL MASS RESECTION;  Surgeon: Corliss Skains, MD;  Location: [facility] OR;  Service: Thoracic;  Laterality: Right;  posterior   CYST EXCISION  x2   LEFT HEART CATH AND CORONARY ANGIOGRAPHY N/A 05/31/2020   Procedure: LEFT HEART CATH AND CORONARY ANGIOGRAPHY;  Surgeon: Yvonne Kendall, MD;  Location: [facility];  Service: Cardiovascular;  Laterality: N/A;   LIPOMA EXCISION      PARATHYROIDECTOMY     TONSILLECTOMY      Current Meds  Medication Sig   acetaminophen (TYLENOL) 500 MG tablet Take 1,000 mg by mouth daily as needed.   atorvastatin (LIPITOR) 80 MG tablet Take 1 tablet (80 mg total) by mouth daily.   cetirizine (ZYRTEC) 10 MG tablet Take 10 mg by mouth daily.   conjugated estrogens (PREMARIN) vaginal cream PLACE 0.5 GRAMS TWICE WEEKLY INTRAVAGINALLY.   diphenhydrAMINE (BENADRYL) 25 MG tablet Take 25 mg by mouth at bedtime.   fluticasone (FLONASE) 50 MCG/ACT nasal spray Place 2 sprays into both nostrils daily.   furosemide (LASIX) 20 MG tablet Take 1 tablet (20 mg total) by mouth daily as needed.   levothyroxine (SYNTHROID) 75 MCG tablet TAKE 1 TABLET BY MOUTH EVERY DAY BEFORE BREAKFAST   losartan (COZAAR) 25 MG tablet Take 0.5 tablets (12.5 mg total) by mouth daily.   metoprolol succinate (TOPROL-XL) 25 MG 24 hr tablet Take 0.5 tablets (12.5 mg total) by mouth daily.   Multiple Vitamins-Minerals (CENTRUM SILVER 50+WOMEN PO) Take 1 tablet by mouth daily at 6 (six) AM.   nitroGLYCERIN (NITROSTAT) 0.4 MG SL tablet Place 1 tablet (0.4 mg total) under the tongue every 5 (five) minutes as needed for chest pain.   pantoprazole (PROTONIX) 40 MG tablet Take 1 tablet (40 mg total) by mouth daily.   PROAIR RESPICLICK 108 (90 Base) MCG/ACT AEPB INHALE 2 PUFFS INTO THE LUNGS EVERY 6 (SIX) HOURS AS NEEDED (SOB/WHEEZING).   ticagrelor (BRILINTA) 60 MG TABS tablet TAKE 1 TABLET (60 MG TOTAL) BY MOUTH 2 (TWO) TIMES DAILY.   umeclidinium-vilanterol (ANORO ELLIPTA) 62.5-25 MCG/ACT AEPB Inhale 1 puff into the lungs daily.    Allergies: Azithromycin, Humira [adalimumab], and Tape  Social History   Tobacco Use   Smoking status: Former    Packs/day: 1.00    Years: 40.00    Additional pack years: 0.00    Total pack years: 40.00    Types: Cigarettes    Quit date: 09/18/2015    Years since quitting: 7.3   Smokeless tobacco: Never  Vaping Use   Vaping Use: Never used   Substance Use Topics   Alcohol use: Yes    Alcohol/week: 6.0 standard drinks of alcohol    Types: 6 Standard drinks or equivalent per week    Comment: 2-3 glasses wine, 2-3x/wk   Drug use: Never    Family History  Problem Relation Age of Onset   Heart attack Mother 49   COPD Mother    Stroke Mother    Diverticulitis Mother    Cancer Mother        breast   Hypertension Mother    Breast cancer Mother 58   Hypertension Father    Stroke Maternal Grandfather    Alzheimer's disease Paternal Grandmother    Heart attack Paternal Grandfather    Breast cancer Cousin        mat cousin x 2   Hypertension Brother     Review of Systems: A 12-system review of systems was performed and was negative except as noted in the HPI.  --------------------------------------------------------------------------------------------------  Physical Exam: BP 126/80 (BP Location: Left Arm, Patient Position: Sitting, Cuff Size: Normal)   Pulse 79  Ht 5\' 6"  (1.676 m)   Wt 146 lb (66.2 kg)   LMP 11/16/2007 (Approximate)   SpO2 98%   BMI 23.57 kg/m   General:  NAD. Neck: No JVD or HJR. Lungs: Clear to auscultation bilaterally without wheezes or crackles. Heart: Regular rate and rhythm without murmurs, rubs, or gallops. Abdomen: Soft, nontender, nondistended. Extremities: No lower extremity edema.  Lab Results  Component Value Date   WBC 6.3 09/13/2022   HGB 13.1 09/13/2022   HCT 38.3 09/13/2022   MCV 98 (H) 09/13/2022   PLT 397 09/13/2022    Lab Results  Component Value Date   NA 142 09/13/2022   K 4.1 09/13/2022   CL 101 09/13/2022   CO2 26 09/13/2022   BUN 18 09/13/2022   CREATININE 0.97 09/13/2022   GLUCOSE 86 09/13/2022   ALT 27 09/13/2022    Lab Results  Component Value Date   CHOL 163 09/13/2022   HDL 83 09/13/2022   LDLCALC 67 09/13/2022   TRIG 69 09/13/2022     --------------------------------------------------------------------------------------------------  ASSESSMENT AND PLAN: Coronary artery disease: No further chest pain reported.  Left shoulder/arm pain in March resolved spontaneously and was likely MSK in nature.  Continue ticagrelor and atorvastatin for secondary prevention.  We will likely switch ticagrelor to aspirin when she turns 64 and cost increases.  Pericardial effusion: Small to moderate pericardial effusion noted on recent echo.  It appears similar in size on my review compared to the echo from 2022.  Given lack of symptoms, we will defer further workup.  Stress-induced cardiomyopathy: LVEF normal on recent echo.  Stable DOE reported, most likely driven by chronic lung disease.  No medication changes or additional testing at this time.  PAD: No claudication reported.  Continue regular walking and aggressive statin/antiplatelet therapy.  Follow-up: Return to clinic in 1 year.  Yvonne Kendall, MD 01/31/2023 4:28 PM

## 2023-01-31 NOTE — Patient Instructions (Signed)
Medication Instructions:  Your Physician recommend you continue on your current medication as directed.    *If you need a refill on your cardiac medications before your next appointment, please call your pharmacy*   Lab Work: None ordered today   Testing/Procedures: None ordered today   Follow-Up: At  [facility], you and your health needs are our priority.  As part of our continuing mission to provide you with exceptional heart care, we have created designated Provider Care Teams.  These Care Teams include your primary Cardiologist (physician) and Advanced Practice Providers (APPs -  Physician Assistants and Nurse Practitioners) who all work together to provide you with the care you need, when you need it.  We recommend signing up for the patient portal called "MyChart".  Sign up information is provided on this After Visit Summary.  MyChart is used to connect with patients for Virtual Visits (Telemedicine).  Patients are able to view lab/test results, encounter notes, upcoming appointments, etc.  Non-urgent messages can be sent to your provider as well.   To learn more about what you can do with MyChart, go to [email or web address].    Your next appointment:   1 year(s)  Provider:   You may see Christopher End, MD or one of the following Advanced Practice Providers on your designated Care Team:   Christopher Berge, NP Ryan Dunn, PA-C Cadence Furth, PA-C Sheri Hammock, NP    

## 2023-02-01 ENCOUNTER — Encounter: Payer: Self-pay | Admitting: Internal Medicine

## 2023-02-18 ENCOUNTER — Encounter: Payer: Self-pay | Admitting: Nurse Practitioner

## 2023-02-24 ENCOUNTER — Encounter: Payer: Self-pay | Admitting: Pulmonary Disease

## 2023-02-24 ENCOUNTER — Ambulatory Visit: Payer: No Typology Code available for payment source | Admitting: Pulmonary Disease

## 2023-02-24 VITALS — BP 126/74 | HR 66 | Temp 98.0°F | Ht 66.0 in | Wt 146.2 lb

## 2023-02-24 DIAGNOSIS — E8801 Alpha-1-antitrypsin deficiency: Secondary | ICD-10-CM

## 2023-02-24 DIAGNOSIS — J449 Chronic obstructive pulmonary disease, unspecified: Secondary | ICD-10-CM | POA: Diagnosis not present

## 2023-02-24 DIAGNOSIS — Z87891 Personal history of nicotine dependence: Secondary | ICD-10-CM

## 2023-02-24 NOTE — Patient Instructions (Addendum)
Information on alpha-1 can be found on the [employer].  This is a very detailed website that has the answers to almost any question you may have about alpha-1.  http://www.pope.info/  For reference your alpha phenotype is MZ and your last level was 89 which is mildly below the norm.  This is the reason why we need to keep track of your levels.  We are getting breathing tests and another alpha-1 level towards mid August.  We will see you in follow-up in late August.  If you are doing well at that time we can then move the appointments to every 6 months.  We have referred you to the lung cancer screening program.

## 2023-02-24 NOTE — Progress Notes (Signed)
Subjective:    Patient ID: April Raymond, female    DOB: [birth year], 62 y.o.   MRN: [id number] Patient Care Team: Marjie Skiff, NP as PCP - General (Nurse Practitioner) End, Cristal Deer, MD as PCP - Cardiology (Cardiology)  Chief Complaint  Patient presents with  . Follow-up    HPI April Raymond is a 62 year old former smoker with a 40-pack-year history of smoking, alpha-1 MZ phenotype with mild deficiency and dyspnea who presents for follow-up.  This is a scheduled visit.  She was last seen on 05 November 2022.  For the details of that visit please refer to that note. She has been doing well on Anoro Ellipta which has helped relieve her symptoms of dyspnea.  However she has noted over the last several months that she has developed dry cough at nighttime but no perceived increased shortness of breath or wheezing.  She does not endorse any fevers, chills or sweats.  No chest pain. No orthopnea or paroxysmal nocturnal dyspnea.   We counseled her during her last visit with regards to implications of MZ phenotype and mildly decreased alpha-1 levels. Also discussed the implications with regards to children and grandchildren needed to be screened for the disease.  She states that none of her children or grandchildren have expressed desire to be tested.   She has not had any sputum production, hemoptysis, weight loss or anorexia.  No lower extremity edema.  No calf tenderness.  Overall she feels well and looks well.   Review of Systems A 10 point review of systems was performed and it is as noted above otherwise negative.  Patient Active Problem List   Diagnosis Date Noted  . Alpha-1-antitrypsin deficiency (HCC) 09/09/2022  . GERD without esophagitis 09/09/2022  . Spondylosis without myelopathy or radiculopathy, thoracic region 01/24/2022  . Chronic anticoagulation (BRILINTA) 01/17/2022  . DDD (degenerative disc disease), thoracic 01/09/2022  . Thoracic facet syndrome 01/09/2022  . Cervical  facet syndrome 01/09/2022  . Cervicalgia 11/19/2021  . DDD (degenerative disc disease), cervical 11/19/2021  . Grade 1 Anterolisthesis of cervical spine (C7/T1) 11/19/2021  . Grade 1 Anterolisthesis of thoracic spine (T2/T3) 11/19/2021  . 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 Chronic pain syndrome 11/17/2021  . Chronic upper back pain (1ry area of Pain) (Right) 10/22/2021  . Chronic thoracic back pain (Bilateral) (R>L) 10/22/2021  . Chronic neck pain (2ry area of Pain) (Bilateral) (R>L) 10/22/2021  . Chronic shoulder pain (3ry area of Pain) (Bilateral) 10/22/2021  . Painful cervical range of motion 10/22/2021  . Impaired range of motion of cervical spine 10/22/2021  . Spasm of thoracic back muscle 10/22/2021  . H/O lipoma 05/09/2021  . Pericardial effusion 05/02/2021  . Stage 2 moderate COPD by GOLD classification (HCC) 01/12/2021  . PAD (peripheral artery disease) (HCC) 09/22/2020  . Stress-induced cardiomyopathy 05/31/2020  . Former smoker 09/30/2019  . History of MI (myocardial infarction) 09/30/2019  . History of parathyroidectomy 09/30/2019  . Coronary artery disease involving native coronary artery of native heart without angina pectoris 09/30/2019  . Essential hypertension 09/30/2019  . Hyperlipidemia LDL goal <70 09/30/2019  . Hashimoto's disease 09/30/2019  . Vaginal atrophy 09/30/2019  . Hidradenitis suppurativa 09/30/2019   Social History   Tobacco Use  . Smoking status: Former    Packs/day: 1.00    Years: 40.00    Additional pack years: 0.00    Total pack years: 40.00    Types: Cigarettes    Quit date: 09/18/2015    Years since quitting:  7.4  . Smokeless tobacco: Never  Substance Use Topics  . Alcohol use: Yes    Alcohol/week: 6.0 standard drinks of alcohol    Types: 6 Standard drinks or equivalent per week    Comment: 2-3 glasses wine, 2-3x/wk   Allergies  Allergen Reactions  . Azithromycin Rash  . Humira [Adalimumab] Rash  . Tape Rash   Current  Meds  Medication Sig  . acetaminophen (TYLENOL) 500 MG tablet Take 1,000 mg by mouth daily as needed.  Marland Kitchen atorvastatin (LIPITOR) 80 MG tablet Take 1 tablet (80 mg total) by mouth daily.  . cetirizine (ZYRTEC) 10 MG tablet Take 10 mg by mouth daily.  Marland Kitchen conjugated estrogens (PREMARIN) vaginal cream PLACE 0.5 GRAMS TWICE WEEKLY INTRAVAGINALLY.  . diphenhydrAMINE (BENADRYL) 25 MG tablet Take 25 mg by mouth at bedtime.  . fluticasone (FLONASE) 50 MCG/ACT nasal spray Place 2 sprays into both nostrils daily.  . furosemide (LASIX) 20 MG tablet Take 1 tablet (20 mg total) by mouth daily as needed.  Marland Kitchen levothyroxine (SYNTHROID) 75 MCG tablet TAKE 1 TABLET BY MOUTH EVERY DAY BEFORE BREAKFAST  . losartan (COZAAR) 25 MG tablet Take 0.5 tablets (12.5 mg total) by mouth daily.  . metoprolol succinate (TOPROL-XL) 25 MG 24 hr tablet Take 0.5 tablets (12.5 mg total) by mouth daily.  . Multiple Vitamins-Minerals (CENTRUM SILVER 50+WOMEN PO) Take 1 tablet by mouth daily at 6 (six) AM.  . nitroGLYCERIN (NITROSTAT) 0.4 MG SL tablet Place 1 tablet (0.4 mg total) under the tongue every 5 (five) minutes as needed for chest pain.  . pantoprazole (PROTONIX) 40 MG tablet Take 1 tablet (40 mg total) by mouth daily.  Marland Kitchen PROAIR RESPICLICK 108 (90 Base) MCG/ACT AEPB INHALE 2 PUFFS INTO THE LUNGS EVERY 6 (SIX) HOURS AS NEEDED (SOB/WHEEZING).  Marland Kitchen ticagrelor (BRILINTA) 60 MG TABS tablet TAKE 1 TABLET (60 MG TOTAL) BY MOUTH 2 (TWO) TIMES DAILY.  Marland Kitchen umeclidinium-vilanterol (ANORO ELLIPTA) 62.5-25 MCG/ACT AEPB Inhale 1 puff into the lungs daily.   Immunization History  Administered Date(s) Administered  . Influenza,inj,Quad PF,6+ Mos 05/19/2020  . Influenza-Unspecified 07/19/2019, 06/22/2021, 06/12/2022  . Moderna SARS-COV2 Booster Vaccination 01/12/2021  . Moderna Sars-Covid-2 Vaccination 12/16/2019, 01/13/2020, 08/08/2020, 06/22/2021  . Tdap 03/21/2020       Objective:   Physical Exam BP 126/74 (BP Location: Left Arm, Cuff  Size: Normal)   Pulse 66   Temp 98 F (36.7 C) (Temporal)   Ht 5\' 6"  (1.676 m)   Wt 146 lb 3.2 oz (66.3 kg)   LMP 11/16/2007 (Approximate)   SpO2 96%   BMI 23.60 kg/m   SpO2: 96 % O2 Device: None (Room air)  GENERAL: Well-developed, well-nourished woman in no acute distress.  Fully ambulatory.  No conversational dyspnea. HEAD: Normocephalic, atraumatic.  EYES: Pupils equal, round, reactive to light.  No scleral icterus.  MOUTH: Nose/mouth/throat not examined due to masking requirements for COVID 19. NECK: Supple. No thyromegaly. Trachea midline. No JVD.  No adenopathy. PULMONARY: Good air entry bilaterally.  Mild end expiratory wheezes noted. CARDIOVASCULAR: S1 and S2. Regular rate and rhythm.  No rubs, murmurs or gallops heard. ABDOMEN: Benign. MUSCULOSKELETAL: No joint deformity, no clubbing, no edema.  NEUROLOGIC: No focal deficit, no gait disturbance, speech is fluent. SKIN: Intact,warm,dry.  No rashes. PSYCH: Mood and behavior normal.         Assessment & Plan:

## 2023-03-10 ENCOUNTER — Other Ambulatory Visit: Payer: Self-pay

## 2023-03-10 DIAGNOSIS — Z87891 Personal history of nicotine dependence: Secondary | ICD-10-CM

## 2023-03-10 DIAGNOSIS — Z122 Encounter for screening for malignant neoplasm of respiratory organs: Secondary | ICD-10-CM

## 2023-03-15 NOTE — Patient Instructions (Signed)
Be Involved in Your Health Care:  Taking Medications When medications are taken as directed, they can greatly improve your health. But if they are not taken as instructed, they may not work. In some cases, not taking them correctly can be harmful. To help ensure your treatment remains effective and safe, understand your medications and how to take them.  Your lab results, notes and after visit summary will be available on My Chart. We strongly encourage you to use this feature. If lab results are abnormal the clinic will contact you with the appropriate steps. If the clinic does not contact you assume the results are satisfactory. You can always see your results on My Chart. If you have questions regarding your condition, please contact the clinic during office hours. You can also ask questions on My Chart.  We at [facility] are grateful that you chose us to provide care. We strive to provide excellent and compassionate care and are always looking for feedback. If you get a survey from the clinic please complete this.   Heart-Healthy Eating Plan Many factors influence your heart health, including eating and exercise habits. Heart health is also called coronary health. Coronary risk increases with abnormal blood fat (lipid) levels. A heart-healthy eating plan includes limiting unhealthy fats, increasing healthy fats, limiting salt (sodium) intake, and making other diet and lifestyle changes. What is my plan? Your health care provider may recommend that: You limit your fat intake to _________% or less of your total calories each day. You limit your saturated fat intake to _________% or less of your total calories each day. You limit the amount of cholesterol in your diet to less than _________ mg per day. You limit the amount of sodium in your diet to less than _________ mg per day. What are tips for following this plan? Cooking Cook foods using methods other than frying. Baking,  boiling, grilling, and broiling are all good options. Other ways to reduce fat include: Removing the skin from poultry. Removing all visible fats from meats. Steaming vegetables in water or broth. Meal planning  At meals, imagine dividing your plate into fourths: Fill one-half of your plate with vegetables and green salads. Fill one-fourth of your plate with whole grains. Fill one-fourth of your plate with lean protein foods. Eat 2-4 cups of vegetables per day. One cup of vegetables equals 1 cup (91 g) broccoli or cauliflower florets, 2 medium carrots, 1 large bell pepper, 1 large sweet potato, 1 large tomato, 1 medium white potato, 2 cups (150 g) raw leafy greens. Eat 1-2 cups of fruit per day. One cup of fruit equals 1 small apple, 1 large banana, 1 cup (237 g) mixed fruit, 1 large orange,  cup (82 g) dried fruit, 1 cup (240 mL) 100% fruit juice. Eat more foods that contain soluble fiber. Examples include apples, broccoli, carrots, beans, peas, and barley. Aim to get 25-30 g of fiber per day. Increase your consumption of legumes, nuts, and seeds to 4-5 servings per week. One serving of dried beans or legumes equals  cup (90 g) cooked, 1 serving of nuts is  oz (12 almonds, 24 pistachios, or 7 walnut halves), and 1 serving of seeds equals  oz (8 g). Fats Choose healthy fats more often. Choose monounsaturated and polyunsaturated fats, such as olive and canola oils, avocado oil, flaxseeds, walnuts, almonds, and seeds. Eat more omega-3 fats. Choose salmon, mackerel, sardines, tuna, flaxseed oil, and ground flaxseeds. Aim to eat fish at least 2   times each week. Check food labels carefully to identify foods with trans fats or high amounts of saturated fat. Limit saturated fats. These are found in animal products, such as meats, butter, and cream. Plant sources of saturated fats include palm oil, palm kernel oil, and coconut oil. Avoid foods with partially hydrogenated oils in them. These contain  trans fats. Examples are stick margarine, some tub margarines, cookies, crackers, and other baked goods. Avoid fried foods. General information Eat more home-cooked food and less restaurant, buffet, and fast food. Limit or avoid alcohol. Limit foods that are high in added sugar and simple starches such as foods made using white refined flour (white breads, pastries, sweets). Lose weight if you are overweight. Losing just 5-10% of your body weight can help your overall health and prevent diseases such as diabetes and heart disease. Monitor your sodium intake, especially if you have high blood pressure. Talk with your health care provider about your sodium intake. Try to incorporate more vegetarian meals weekly. What foods should I eat? Fruits All fresh, canned (in natural juice), or frozen fruits. Vegetables Fresh or frozen vegetables (raw, steamed, roasted, or grilled). Green salads. Grains Most grains. Choose whole wheat and whole grains most of the time. Rice and pasta, including brown rice and pastas made with whole wheat. Meats and other proteins Lean, well-trimmed beef, veal, pork, and lamb. Chicken and turkey without skin. All fish and shellfish. Wild duck, rabbit, pheasant, and venison. Egg whites or low-cholesterol egg substitutes. Dried beans, peas, lentils, and tofu. Seeds and most nuts. Dairy Low-fat or nonfat cheeses, including ricotta and mozzarella. Skim or 1% milk (liquid, powdered, or evaporated). Buttermilk made with low-fat milk. Nonfat or low-fat yogurt. Fats and oils Non-hydrogenated (trans-free) margarines. Vegetable oils, including soybean, sesame, sunflower, olive, avocado, peanut, safflower, corn, canola, and cottonseed. Salad dressings or mayonnaise made with a vegetable oil. Beverages Water (mineral or sparkling). Coffee and tea. Unsweetened ice tea. Diet beverages. Sweets and desserts Sherbet, gelatin, and fruit ice. Small amounts of dark chocolate. Limit all  sweets and desserts. Seasonings and condiments All seasonings and condiments. The items listed above may not be a complete list of foods and beverages you can eat. Contact a dietitian for more options. What foods should I avoid? Fruits Canned fruit in heavy syrup. Fruit in cream or butter sauce. Fried fruit. Limit coconut. Vegetables Vegetables cooked in cheese, cream, or butter sauce. Fried vegetables. Grains Breads made with saturated or trans fats, oils, or whole milk. Croissants. Sweet rolls. Donuts. High-fat crackers, such as cheese crackers and chips. Meats and other proteins Fatty meats, such as hot dogs, ribs, sausage, bacon, rib-eye roast or steak. High-fat deli meats, such as salami and bologna. Caviar. Domestic duck and goose. Organ meats, such as liver. Dairy Cream, sour cream, cream cheese, and creamed cottage cheese. Whole-milk cheeses. Whole or 2% milk (liquid, evaporated, or condensed). Whole buttermilk. Cream sauce or high-fat cheese sauce. Whole-milk yogurt. Fats and oils Meat fat, or shortening. Cocoa butter, hydrogenated oils, palm oil, coconut oil, palm kernel oil. Solid fats and shortenings, including bacon fat, salt pork, lard, and butter. Nondairy cream substitutes. Salad dressings with cheese or sour cream. Beverages Regular sodas and any drinks with added sugar. Sweets and desserts Frosting. Pudding. Cookies. Cakes. Pies. Milk chocolate or white chocolate. Buttered syrups. Full-fat ice cream or ice cream drinks. The items listed above may not be a complete list of foods and beverages to avoid. Contact a dietitian for more information. Summary Heart-healthy meal   planning includes limiting unhealthy fats, increasing healthy fats, limiting salt (sodium) intake and making other diet and lifestyle changes. Lose weight if you are overweight. Losing just 5-10% of your body weight can help your overall health and prevent diseases such as diabetes and heart disease. Focus on  eating a balance of foods, including fruits and vegetables, low-fat or nonfat dairy, lean protein, nuts and legumes, whole grains, and heart-healthy oils and fats. This information is not intended to replace advice given to you by your health care provider. Make sure you discuss any questions you have with your health care provider. Document Revised: 10/08/2021 Document Reviewed: 10/08/2021 Elsevier Patient Education  2024 [employer]vier Inc.  

## 2023-03-17 ENCOUNTER — Encounter: Payer: Self-pay | Admitting: Nurse Practitioner

## 2023-03-17 ENCOUNTER — Ambulatory Visit: Payer: No Typology Code available for payment source | Admitting: Nurse Practitioner

## 2023-03-17 VITALS — BP 102/64 | HR 68 | Temp 97.6°F | Ht 66.0 in | Wt 144.8 lb

## 2023-03-17 DIAGNOSIS — E8801 Alpha-1-antitrypsin deficiency: Secondary | ICD-10-CM | POA: Diagnosis not present

## 2023-03-17 DIAGNOSIS — I10 Essential (primary) hypertension: Secondary | ICD-10-CM

## 2023-03-17 DIAGNOSIS — I2510 Atherosclerotic heart disease of native coronary artery without angina pectoris: Secondary | ICD-10-CM | POA: Diagnosis not present

## 2023-03-17 DIAGNOSIS — J449 Chronic obstructive pulmonary disease, unspecified: Secondary | ICD-10-CM | POA: Diagnosis not present

## 2023-03-17 DIAGNOSIS — E063 Autoimmune thyroiditis: Secondary | ICD-10-CM

## 2023-03-17 DIAGNOSIS — Z1231 Encounter for screening mammogram for malignant neoplasm of breast: Secondary | ICD-10-CM

## 2023-03-17 DIAGNOSIS — I739 Peripheral vascular disease, unspecified: Secondary | ICD-10-CM

## 2023-03-17 DIAGNOSIS — G894 Chronic pain syndrome: Secondary | ICD-10-CM

## 2023-03-17 DIAGNOSIS — E785 Hyperlipidemia, unspecified: Secondary | ICD-10-CM

## 2023-03-17 DIAGNOSIS — I252 Old myocardial infarction: Secondary | ICD-10-CM

## 2023-03-17 MED ORDER — TIZANIDINE HCL 4 MG PO TABS: 4.0000 mg | ORAL_TABLET | Freq: Three times a day (TID) | ORAL | 0 refills | Status: DC | PRN

## 2023-03-17 NOTE — Assessment & Plan Note (Signed)
Chronic, stable. Continue current collaboration with pulmonary and inhaler regimen as ordered by them.  Recommend continued cessation of smoking. 

## 2023-03-17 NOTE — Assessment & Plan Note (Signed)
Chronic, followed by pulmonary -- will continue this collaboration.  Recent notes reviewed. Appreciate their input. 

## 2023-03-17 NOTE — Assessment & Plan Note (Signed)
Chronic, ongoing.  Continue current medication regimen and adjust as needed.  Thyroid labs up to date. 

## 2023-03-17 NOTE — Assessment & Plan Note (Signed)
Chronic, stable.  Will send in Tizanidine to use as needed while traveling long distances.  Educated on this.

## 2023-03-17 NOTE — Assessment & Plan Note (Signed)
Continue collaboration with cardiology and current regimen as ordered by them. 

## 2023-03-17 NOTE — Progress Notes (Signed)
BP 102/64 (BP Location: Left Arm, Patient Position: Sitting, Cuff Size: Normal)   Pulse 68   Temp 97.6 F (36.4 C) (Oral)   Ht 5\' 6"  (1.676 m)   Wt 144 lb 12.8 oz (65.7 kg)   LMP 11/16/2007 (Approximate)   SpO2 98%   BMI 23.37 kg/m    Subjective:    Patient ID: April Raymond, female    DOB: 20-Dec-1960, 62 y.o.   MRN: [id number]  HPI: April Raymond is a 62 y.o. female  Chief Complaint  Patient presents with   Hyperlipidemia   Hypertension   COPD   Muscle Pain    Patient says she is noticing some tight muscle discomfort in her lower back area. Patient says it has been an ongoing issues for years. Patient says she had a recent flare up the last few days.    She would like a prescription for muscle relaxer as is about to travel, worried about her lower back as this still causes issues.  Has been to pain clinic in past, last 02/19/22 -- does not wish to return.  HYPERTENSION WITH CAD & HLD Followed by cardiology and last visit 01/31/23, stress-induced cardiomyopathy and angina.  Continues on Ticagrelor, Metoprolol, Losartan, Lasix daily + can take PRN dose and NTG. Has not had to use any NTG.  Echo on 12/13/22 = 60-65%. Taking Atorvastatin 80 MG.  Background: History of MI at age 60.  Has a family history of MI, mother in her 61's and her father's dad passed in his 59's.  Had parathyroid removed in 2018, history of hyperparathyroid.   Satisfied with current treatment? yes Duration of hypertension: chronic BP monitoring frequency: rarely BP range:  BP medication side effects: no Duration of hyperlipidemia: years Cholesterol medication side effects: no Cholesterol supplements: none Medication compliance: excellent compliance Aspirin: yes Recent stressors: no Recurrent headaches: no Visual changes: no Palpitations: no Dyspnea: no Chest pain: no Lower extremity edema: no Dizzy/lightheaded: no  The ASCVD Risk score (Arnett DK, et al., 2019) failed to calculate for the  following reasons:   The patient has a prior MI or stroke diagnosis   HYPOTHYROIDISM Continues on Levothyroxine 75 MCG daily.   Thyroid control status:controlled Satisfied with current treatment? yes Medication side effects: no Medication compliance: good compliance Etiology of hypothyroidism:  Recent dose adjustment:no Fatigue: no Cold intolerance: no Heat intolerance: no Weight gain: no Weight loss: no Constipation: no Diarrhea/loose stools: no Palpitations: no Lower extremity edema: no Anxiety/depressed mood: no   COPD History of smoking, quit at 54 -- smoked 1 PPD.  Last visit with pulmonary on 02/24/23 with no changes made.  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  Continues Anoro and has Albuterol to use as needed.  They tried Trelegy, but the steroid element made her too hyperactive and could not sleep. COPD status: controlled Satisfied with current treatment?: yes Oxygen use: no Dyspnea frequency: none Cough frequency: none Rescue inhaler frequency: none Limitation of activity: no Productive cough: none Last Spirometry: with pulmonary  Relevant past medical, surgical, family and social history reviewed and updated as indicated. Interim medical history since our last visit reviewed. Allergies and medications reviewed and updated.  Review of Systems  Constitutional:  Negative for activity change, appetite change, diaphoresis, fatigue and fever.  Respiratory:  Negative for cough, chest tightness, shortness of breath and wheezing.   Cardiovascular:  Negative for chest pain, palpitations and leg swelling.  Gastrointestinal: Negative.   Endocrine: Negative for cold  intolerance and heat intolerance.  Neurological: Negative.   Psychiatric/Behavioral: Negative.      Per HPI unless specifically indicated above     Objective:    BP 102/64 (BP Location: Left Arm, Patient Position: Sitting, Cuff Size: Normal)   Pulse 68   Temp 97.6 F (36.4 C)  (Oral)   Ht 5\' 6"  (1.676 m)   Wt 144 lb 12.8 oz (65.7 kg)   LMP 11/16/2007 (Approximate)   SpO2 98%   BMI 23.37 kg/m   Wt Readings from Last 3 Encounters:  03/17/23 144 lb 12.8 oz (65.7 kg)  02/24/23 146 lb 3.2 oz (66.3 kg)  01/31/23 146 lb (66.2 kg)    Physical Exam Vitals and nursing note reviewed.  Constitutional:      General: She is awake. She is not in acute distress.    Appearance: She is well-developed and well-groomed. She is not ill-appearing.  HENT:     Head: Normocephalic.     Right Ear: Hearing normal.     Left Ear: Hearing normal.  Eyes:     General: Lids are normal.        Right eye: No discharge.        Left eye: No discharge.     Conjunctiva/sclera: Conjunctivae normal.     Pupils: Pupils are equal, round, and reactive to light.  Neck:     Thyroid: No thyromegaly or thyroid tenderness.  Cardiovascular:     Rate and Rhythm: Normal rate and regular rhythm.     Pulses: Normal pulses.     Heart sounds: Normal heart sounds. No murmur heard.    No gallop.  Pulmonary:     Effort: Pulmonary effort is normal. No accessory muscle usage or respiratory distress.     Breath sounds: Normal breath sounds.  Abdominal:     General: Bowel sounds are normal.     Palpations: Abdomen is soft.  Musculoskeletal:     Cervical back: Normal range of motion and neck supple.     Right lower leg: No edema.     Left lower leg: No edema.  Lymphadenopathy:     Cervical: No cervical adenopathy.  Skin:    General: Skin is warm and dry.  Neurological:     General: No focal deficit present.     Mental Status: She is alert and oriented to person, place, and time.     Deep Tendon Reflexes: Reflexes are normal and symmetric.  Psychiatric:        Attention and Perception: Attention normal.        Mood and Affect: Mood normal.        Speech: Speech normal.        Behavior: Behavior normal. Behavior is cooperative.        Thought Content: Thought content normal.    Results for  orders placed or performed during the hospital encounter of 12/13/22  ECHOCARDIOGRAM COMPLETE  Result Value Ref Range   Ao pk vel 1.15 m/s   AV Area VTI 2.22 cm2   AR max vel 2.16 cm2   AV Mean grad 3.0 mmHg   AV Peak grad 5.3 mmHg   S' Lateral 2.60 cm   AV Area mean vel 2.23 cm2   Area-P 1/2 3.72 cm2   MV VTI 2.05 cm2   Est EF 60 - 65%       Assessment & Plan:   Problem List Items Addressed This Visit       Cardiovascular and Mediastinum  Coronary artery disease involving native coronary artery of native heart without angina pectoris    Chronic, stable with no recent CP.  Continue Ticagrelor, ASA, and Atorvastatin for prevention + collaboration with cardiology.  High risk family and patient history.      Relevant Orders   Comprehensive metabolic panel   Lipid Panel w/o Chol/HDL Ratio   Essential hypertension    Chronic, stable.  BP well below goal. Will continue current medication regimen and adjust as needed.  Continue collaboration with cardiology due to her significant family and personal cardiac history.  Recommend she monitor BP at least a few mornings a week at home and document.  DASH diet at home. Labs today: CMP and lipid panel.  Return in 6 months.       Relevant Orders   Comprehensive metabolic panel   PAD (peripheral artery disease) (HCC)    Ongoing, followed by cardiology.  Continue current medication regimen and collaboration with cardiology.      Relevant Orders   Comprehensive metabolic panel   Lipid Panel w/o Chol/HDL Ratio     Respiratory   Heterozygous alpha 1-antitrypsin deficiency (HCC)    Chronic, followed by pulmonary -- will continue this collaboration.  Recent notes reviewed. Appreciate their input.      Stage 2 moderate COPD by GOLD classification (HCC) - Primary    Chronic, stable. Continue current collaboration with pulmonary and inhaler regimen as ordered by them.  Recommend continued cessation of smoking.      Relevant Medications    umeclidinium-vilanterol (ANORO ELLIPTA) 62.5-25 MCG/ACT AEPB     Endocrine   Hashimoto's disease    Chronic, ongoing.  Continue current medication regimen and adjust as needed.  Thyroid labs up to date.        Other   Chronic pain syndrome (Chronic)    Chronic, stable.  Will send in Tizanidine to use as needed while traveling long distances.  Educated on this.      Relevant Medications   tiZANidine (ZANAFLEX) 4 MG tablet   History of MI (myocardial infarction)    Continue collaboration with cardiology and current regimen as ordered by them.      Hyperlipidemia LDL goal <70   Relevant Orders   Comprehensive metabolic panel   Lipid Panel w/o Chol/HDL Ratio   Other Visit Diagnoses     Encounter for screening mammogram for malignant neoplasm of breast       Mammogram ordered today.   Relevant Orders   MM 3D SCREENING MAMMOGRAM BILATERAL BREAST        Follow up plan: Return in about 6 months (around 09/17/2023) for Return after 09/14/23 for annual physical.

## 2023-03-17 NOTE — Assessment & Plan Note (Signed)
Ongoing, followed by cardiology.  Continue current medication regimen and collaboration with cardiology. 

## 2023-03-17 NOTE — Assessment & Plan Note (Signed)
Chronic, stable with no recent CP.  Continue Ticagrelor, ASA, and Atorvastatin for prevention + collaboration with cardiology.  High risk family and patient history. 

## 2023-03-17 NOTE — Assessment & Plan Note (Signed)
Chronic, stable.  BP well below goal. Will continue current medication regimen and adjust as needed.  Continue collaboration with cardiology due to her significant family and personal cardiac history.  Recommend she monitor BP at least a few mornings a week at home and document.  DASH diet at home. Labs today: CMP and lipid panel.  Return in 6 months.

## 2023-03-18 LAB — LIPID PANEL W/O CHOL/HDL RATIO
Cholesterol, Total: 136 mg/dL (ref 100–199)
HDL: 67 mg/dL (ref >39)
LDL Chol Calc (NIH): 55 mg/dL (ref 0–99)
Triglycerides: 68 mg/dL (ref 0–149)
VLDL Cholesterol Cal: 14 mg/dL (ref 5–40)

## 2023-03-18 LAB — COMPREHENSIVE METABOLIC PANEL
ALT: 22 IU/L (ref 0–32)
AST: 24 IU/L (ref 0–40)
Albumin: 4.7 g/dL (ref 3.9–4.9)
Alkaline Phosphatase: 47 IU/L (ref 44–121)
BUN/Creatinine Ratio: 23 (ref 12–28)
BUN: 21 mg/dL (ref 8–27)
Bilirubin Total: 0.5 mg/dL (ref 0.0–1.2)
CO2: 27 mmol/L (ref 20–29)
Calcium: 9.7 mg/dL (ref 8.7–10.3)
Chloride: 101 mmol/L (ref 96–106)
Creatinine, Ser: 0.91 mg/dL (ref 0.57–1.00)
Globulin, Total: 2.1 g/dL (ref 1.5–4.5)
Glucose: 86 mg/dL (ref 70–99)
Potassium: 4.2 mmol/L (ref 3.5–5.2)
Sodium: 143 mmol/L (ref 134–144)
Total Protein: 6.8 g/dL (ref 6.0–8.5)
eGFR: 71 mL/min/{1.73_m2} (ref >59)

## 2023-03-18 NOTE — Progress Notes (Signed)
Contacted via MyChart   Good morning April Raymond -- your labs continue to look fabulous!!!  Continue all medications.  You are doing a great job!! Keep being stellar!!  Thank you for allowing me to participate in your care.  I appreciate you. Kindest regards, Valkyrie Guardiola

## 2023-04-09 ENCOUNTER — Other Ambulatory Visit: Payer: Self-pay | Admitting: Nurse Practitioner

## 2023-04-10 NOTE — Telephone Encounter (Signed)
Requested medication (s) are due for refill today - no  Requested medication (s) are on the active medication list -yes  Future visit scheduled -yes  Last refill: 03/17/23 #90  Notes to clinic: non delegated Rx, Pharmacy request: 90 day supply  Requested Prescriptions  Pending Prescriptions Disp Refills   tiZANidine (ZANAFLEX) 4 MG tablet [Pharmacy Med Name: TIZANIDINE HCL 4 MG TABLET] 270 tablet 1    Sig: Take 1 tablet (4 mg total) by mouth every 8 (eight) hours as needed for muscle spasms.     Not Delegated - Cardiovascular:  Alpha-2 Agonists - tizanidine Failed - 04/09/2023  9:31 AM      Failed - This refill cannot be delegated      Passed - Valid encounter within last 6 months    Recent Outpatient Visits           3 weeks ago Stage 2 moderate COPD by GOLD classification (HCC)   [place] [facility] [place], Corrie Dandy T, NP   6 months ago Stage 2 moderate COPD by GOLD classification (HCC)   [place] [facility] [place], Corrie Dandy T, NP   1 year ago Moderate COPD (chronic obstructive pulmonary disease) (HCC)   [place] [facility] [place], Corrie Dandy T, NP   1 year ago Generalized rash   [place] [facility] [place], [place] T, NP   1 year ago Generalized rash   [place] [facility] [place], [place] T, NP       Future Appointments             In 5 months Cannady, Marcus T, NP [place] [facility]               Requested Prescriptions  Pending Prescriptions Disp Refills   tiZANidine (ZANAFLEX) 4 MG tablet [Pharmacy Med Name: TIZANIDINE HCL 4 MG TABLET] 270 tablet 1    Sig: Take 1 tablet (4 mg total) by mouth every 8 (eight) hours as needed for muscle spasms.     Not Delegated - Cardiovascular:  Alpha-2 Agonists - tizanidine Failed - 04/09/2023  9:31 AM      Failed - This refill cannot be delegated      Passed - Valid encounter within last 6 months    Recent Outpatient  Visits           3 weeks ago Stage 2 moderate COPD by GOLD classification (HCC)   [place] [facility] [place], Corrie Dandy T, NP   6 months ago Stage 2 moderate COPD by GOLD classification (HCC)   [place] [facility] [place], Corrie Dandy T, NP   1 year ago Moderate COPD (chronic obstructive pulmonary disease) (HCC)   [place] [facility] [place], Corrie Dandy T, NP   1 year ago Generalized rash   [place] [facility] [place], Corrie Dandy T, NP   1 year ago Generalized rash   [place] [facility] [place], Dorie Rank, NP       Future Appointments             In 5 months Cannady, Dorie Rank, NP [place] [facility]

## 2023-04-30 ENCOUNTER — Encounter: Payer: Self-pay | Admitting: Acute Care

## 2023-04-30 ENCOUNTER — Ambulatory Visit: Payer: No Typology Code available for payment source | Admitting: Acute Care

## 2023-04-30 DIAGNOSIS — Z87891 Personal history of nicotine dependence: Secondary | ICD-10-CM

## 2023-04-30 NOTE — Progress Notes (Signed)
Virtual Visit via Telephone Note  I connected with MICHEAL MATHWIG on 04/30/23 at 10:30 AM EDT by telephone and verified that I am speaking with the correct person using two identifiers.  Location: Patient:  At home Provider: [address] [place], [place], [address]    I discussed the limitations, risks, security and privacy concerns of performing an evaluation and management service by telephone and the availability of in person appointments. I also discussed with the patient that there may be a patient responsible charge related to this service. The patient expressed understanding and agreed to proceed.   Shared Decision Making Visit Lung Cancer Screening Program [phone])   Eligibility: Age 62 y.o. Pack Years Smoking History Calculation 40 pack year smoking history (# packs/per year x # years smoked) Recent History of coughing up blood  no Unexplained weight loss? no ( >Than 15 pounds within the last 6 months ) Prior History Lung / other cancer no (Diagnosis within the last 5 years already requiring surveillance chest CT Scans). Smoking Status Former Smoker Former Smokers: Years since quit: 7 years  Quit Date: 09/18/2015  Visit Components: Discussion included one or more decision making aids. yes Discussion included risk/benefits of screening. yes Discussion included potential follow up diagnostic testing for abnormal scans. yes Discussion included meaning and risk of over diagnosis. yes Discussion included meaning and risk of False Positives. yes Discussion included meaning of total radiation exposure. yes  Counseling Included: Importance of adherence to annual lung cancer LDCT screening. yes Impact of comorbidities on ability to participate in the program. yes Ability and willingness to under diagnostic treatment. yes  Smoking Cessation Counseling: Current Smokers:  Discussed importance of smoking cessation. yes Information about tobacco cessation classes and  interventions provided to patient. yes Patient provided with "ticket" for LDCT Scan. yes Symptomatic Patient. no  Counseling NA Diagnosis Code: Tobacco Use Z72.0 Asymptomatic Patient yes  Counseling (Intermediate counseling: > three minutes counseling) Q5956 Former Smokers:  Discussed the importance of maintaining cigarette abstinence. yes Diagnosis Code: Personal History of Nicotine Dependence. L87.564 Information about tobacco cessation classes and interventions provided to patient. Yes Patient provided with "ticket" for LDCT Scan. yes Written Order for Lung Cancer Screening with LDCT placed in Epic. Yes (CT Chest Lung Cancer Screening Low Dose W/O CM) [id number] Z12.2-Screening of respiratory organs Z87.891-Personal history of nicotine dependence  I spent 25 minutes of face to face time/virtual visit time  with  Ms. Feltz discussing the risks and benefits of lung cancer screening. We took the time to pause the power point at intervals to allow for questions to be asked and answered to ensure understanding. We discussed that she had taken the single most powerful action possible to decrease her risk of developing lung cancer when she quit smoking. I counseled her to remain smoke free, and to contact me if she ever had the desire to smoke again so that I can provide resources and tools to help support the effort to remain smoke free. We discussed the time and location of the scan, and that either  Abigail Miyamoto RN, Karlton Lemon, RN or I  or I will call / send a letter with the results within  24-72 hours of receiving them. She has the office contact information in the event she needs to speak with me,  she verbalized understanding of all of the above and had no further questions upon leaving the office.     I explained to the patient that there has been  a high incidence of coronary artery disease noted on these exams. I explained that this is a non-gated exam therefore degree or severity cannot  be determined. This patient is on statin therapy. I have asked the patient to follow-up with their PCP regarding any incidental finding of coronary artery disease and management with diet or medication as they feel is clinically indicated. The patient verbalized understanding of the above and had no further questions.     Bevelyn Ngo, NP 04/30/2023

## 2023-04-30 NOTE — Patient Instructions (Addendum)
Thank you for participating in the [facility] Screening [facility]. It was our pleasure to meet you today. We will call you with the results of your scan within the next few days. Your scan will be assigned a Lung RADS category score by the physicians reading the scans.  This Lung RADS score determines follow up scanning.  See below for description of categories, and follow up screening recommendations. We will be in touch to schedule your follow up screening annually or based on recommendations of our providers. We will fax a copy of your scan results to your Primary Care Physician, or the physician who referred you to the program, to ensure they have the results. Please call the office if you have any questions or concerns regarding your scanning experience or results.  Our office number is [phone]. Please speak with Abigail Miyamoto, RN.,  Karlton Lemon RN, or Pietro Cassis, RN. They are  our Lung Cancer Screening RN.'s If They are unavailable when you call, Please leave a message on the voice mail. We will return your call at our earliest convenience.This voice mail is monitored several times a day.  Remember, if your scan is normal, we will scan you annually as long as you continue to meet the criteria for the program. (Age 63-80, Current smoker or smoker who has quit within the last 15 years). If you are a smoker, remember, quitting is the single most powerful action that you can take to decrease your risk of lung cancer and other pulmonary, breathing related problems. We know quitting is hard, and we are here to help.  Please let us know if there is anything we can do to help you meet your goal of quitting. If you are a former smoker, Counselling psychologist. We are proud of you! Remain smoke free! Remember you can refer friends or family members through the number above.  We will screen them to make sure they meet criteria for the program. Thank you for helping [place] take better care of you  by participating in Lung Screening.   Lung RADS Categories:  Lung RADS 1: no nodules or definitely non-concerning nodules.  Recommendation is for a repeat annual scan in 12 months.  Lung RADS 2:  nodules that are non-concerning in appearance and behavior with a very low likelihood of becoming an active cancer. Recommendation is for a repeat annual scan in 12 months.  Lung RADS 3: nodules that are probably non-concerning , includes nodules with a low likelihood of becoming an active cancer.  Recommendation is for a 64-month repeat screening scan. Often noted after an upper respiratory illness. We will be in touch to make sure you have no questions, and to schedule your 63-month scan.  Lung RADS 4 A: nodules with concerning findings, recommendation is most often for a follow up scan in 3 months or additional testing based on our provider's assessment of the scan. We will be in touch to make sure you have no questions and to schedule the recommended 3 month follow up scan.  Lung RADS 4 B:  indicates findings that are concerning. We will be in touch with you to schedule additional diagnostic testing based on our provider's  assessment of the scan.  You can receive free nicotine replacement therapy ( patches, gum or mints) by calling 1-800-QUIT NOW. Please call so we can get you on the path to becoming  a non-smoker. I know it is hard, but you can do this!  Other options for assistance  in smoking cessation ( As covered by your insurance benefits)  Hypnosis for smoking cessation  [employer] [phone]  Acupuncture for smoking cessation  [employer] [phone]

## 2023-05-02 ENCOUNTER — Ambulatory Visit
Admission: RE | Admit: 2023-05-02 | Discharge: 2023-05-02 | Disposition: A | Discharge status: Home / Self Care | Payer: BLUE CROSS/BLUE SHIELD | Source: Ambulatory Visit | Attending: Acute Care | Admitting: Acute Care

## 2023-05-02 DIAGNOSIS — Z122 Encounter for screening for malignant neoplasm of respiratory organs: Secondary | ICD-10-CM | POA: Diagnosis not present

## 2023-05-02 DIAGNOSIS — Z87891 Personal history of nicotine dependence: Secondary | ICD-10-CM | POA: Insufficient documentation

## 2023-05-06 ENCOUNTER — Other Ambulatory Visit
Admission: RE | Admit: 2023-05-06 | Discharge: 2023-05-06 | Disposition: A | Discharge status: Home / Self Care | Payer: BLUE CROSS/BLUE SHIELD | Source: Ambulatory Visit | Attending: Pulmonary Disease | Admitting: Pulmonary Disease

## 2023-05-06 ENCOUNTER — Ambulatory Visit: Payer: BLUE CROSS/BLUE SHIELD

## 2023-05-06 ENCOUNTER — Encounter: Payer: Self-pay | Admitting: Pulmonary Disease

## 2023-05-06 ENCOUNTER — Ambulatory Visit: Payer: No Typology Code available for payment source | Admitting: Pulmonary Disease

## 2023-05-06 VITALS — BP 134/82 | HR 65 | Temp 97.6°F | Ht 66.0 in | Wt 146.7 lb

## 2023-05-06 DIAGNOSIS — Z87891 Personal history of nicotine dependence: Secondary | ICD-10-CM | POA: Diagnosis not present

## 2023-05-06 DIAGNOSIS — J449 Chronic obstructive pulmonary disease, unspecified: Secondary | ICD-10-CM

## 2023-05-06 DIAGNOSIS — E8801 Alpha-1-antitrypsin deficiency: Secondary | ICD-10-CM | POA: Diagnosis not present

## 2023-05-06 MED ORDER — ALBUTEROL SULFATE (2.5 MG/3ML) 0.083% IN NEBU
2.5000 mg | INHALATION_SOLUTION | Freq: Once | RESPIRATORY_TRACT | Status: AC
  Administered 2023-05-06: 2.5 mg via RESPIRATORY_TRACT
  Filled 2023-05-06: qty 3

## 2023-05-06 NOTE — Progress Notes (Unsigned)
Subjective:    Patient ID: April Raymond, female    DOB: 1961/02/09, 62 y.o.   MRN: [id number]  Patient Care Team: Marjie Skiff, NP as PCP - General (Nurse Practitioner) End, Cristal Deer, MD as PCP - Cardiology (Cardiology)  Chief Complaint  Patient presents with   Follow-up    Breathing is fine. No SOB, wheezing, or cough.   HPI April Raymond is a 62 year old former smoker with a 40-pack-year history of smoking, alpha-1 MZ phenotype with mild deficiency and dyspnea who presents for follow-up.  This is a scheduled visit.  She was last seen on 24 February 2023.  At that time she was advised to have an alpha level of 89 mg/dL.  She has been maintained on Anoro for her COPD and dyspnea symptoms.  She notes that this controls her well.  She has gastroesophageal reflux but this is well-controlled with antireflux measures and PPI.  She does not have any issues with cough at nighttime as she did prior.  She does not endorse any fevers, chills or sweats.  No chest pain. No orthopnea or paroxysmal nocturnal dyspnea.   She has not had any sputum production, hemoptysis, weight loss or anorexia.  No lower extremity edema.  No calf tenderness.  She had PFTs performed today and a alpha-1 level drawn.  Overall she feels well and looks well.  We discussed the results of her PFTs from today.  Unfortunately that shows that she has had some decline in function.  FEV1 is 1.81 L or 67% predicted, FVC 2.88 L or 82% predicted, FEV1/FVC 63%, there is significant bronchodilator response with decrease in airway resistance and moderate diffusion defect.  There has been a decline in function compared to testing performed 14 May 2022.  Her alpha-1 level was just drawn so this will be ready in the next day or so.  She had lung cancer screening CT performed 02 May 2023.  I did review the films independently, radiologist has not read the films.  I do not see any salient issues however as noted has not been over read by the  radiologist.  DATA: 10/05/2020 2D echo: Small to moderate size pericardial effusion without hemodynamic compromise, LVEF 60 to 65%. Indeterminate diastolics.  Normal RV function.  No valvular abnormality 10/17/2020 PFTs: FEV1 1.71 L or 62% predicted, FVC 2.73 L or 76% predicted, FEV1/FVC 62%, no bronchodilator response. Diffusion capacity mildly reduced.  Consistent with moderate COPD. 12/04/2020 alpha-1-antitrypsin: Phenotype MZ, level 80 mg/dl. 04/29/2022 alpha 1 antitrypsin level: 87 mg/dL 40/98/1191 PFTs: FEV1 4.78 L or 81% predicted, FVC 3.17 L or 90% predicted, FEV1/FVC 69%, no bronchodilator response.  There is mild hyperinflation and moderate air trapping on lung volumes.  Diffusion capacity moderately reduced.  Compared to 17 October 2020 no significant change.  Study consistent with mild obstructive airways disease with moderate diffusion defect. 11/05/2022 alpha-1 antitrypsin level: 89 mg/dL 29/56/2130 echocardiogram: LVEF 60 to 65%, no wall motion abnormalities, normal right ventricular systolic function.  Normal pulmonary artery systolic pressure.  Small to moderate size pericardial effusion, circumferential, no evidence of cardiac tamponade.  Mild mitral regurgitation.   Review of Systems A 10 point review of systems was performed and it is as noted above otherwise negative.   Patient Active Problem List   Diagnosis Date Noted   Heterozygous alpha 1-antitrypsin deficiency (HCC) 09/09/2022   GERD without esophagitis 09/09/2022   Spondylosis without myelopathy or radiculopathy, thoracic region 01/24/2022   Chronic anticoagulation (BRILINTA) 01/17/2022   DDD (degenerative  disc disease), thoracic 01/09/2022   Thoracic facet syndrome 01/09/2022   Cervical facet syndrome 01/09/2022   Cervicalgia 11/19/2021   DDD (degenerative disc disease), cervical 11/19/2021   Grade 1 Anterolisthesis of cervical spine (C7/T1) 11/19/2021   Grade 1 Anterolisthesis of thoracic spine (T2/T3)  11/19/2021   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   Chronic pain syndrome 11/17/2021   Chronic upper back pain (1ry area of Pain) (Right) 10/22/2021   Chronic thoracic back pain (Bilateral) (R>L) 10/22/2021   Chronic neck pain (2ry area of Pain) (Bilateral) (R>L) 10/22/2021   Chronic shoulder pain (3ry area of Pain) (Bilateral) 10/22/2021   Painful cervical range of motion 10/22/2021   Impaired range of motion of cervical spine 10/22/2021   Spasm of thoracic back muscle 10/22/2021   H/O lipoma 05/09/2021   Pericardial effusion 05/02/2021   Stage 2 moderate COPD by GOLD classification (HCC) 01/12/2021   PAD (peripheral artery disease) (HCC) 09/22/2020   Stress-induced cardiomyopathy 05/31/2020   Former smoker 09/30/2019   History of MI (myocardial infarction) 09/30/2019   History of parathyroidectomy 09/30/2019   Coronary artery disease involving native coronary artery of native heart without angina pectoris 09/30/2019   Essential hypertension 09/30/2019   Hyperlipidemia LDL goal <70 09/30/2019   Hashimoto's disease 09/30/2019   Vaginal atrophy 09/30/2019   Hidradenitis suppurativa 09/30/2019    Social History   Tobacco Use   Smoking status: Former    Current packs/day: 0.00    Average packs/day: 1 pack/day for 40.0 years (40.0 ttl pk-yrs)    Types: Cigarettes    Start date: 09/18/1975    Quit date: 09/18/2015    Years since quitting: 7.6   Smokeless tobacco: Never  Substance Use Topics   Alcohol use: Yes    Alcohol/week: 6.0 standard drinks of alcohol    Types: 6 Standard drinks or equivalent per week    Comment: 2-3 glasses wine, 2-3x/wk    Allergies  Allergen Reactions   Azithromycin Rash   Humira [Adalimumab] Rash   Tape Rash    Current Meds  Medication Sig   acetaminophen (TYLENOL) 500 MG tablet Take 1,000 mg by mouth daily as needed.   atorvastatin (LIPITOR) 80 MG tablet Take 1 tablet (80 mg total) by mouth daily.   cetirizine (ZYRTEC) 10 MG tablet Take 10  mg by mouth daily.   conjugated estrogens (PREMARIN) vaginal cream PLACE 0.5 GRAMS TWICE WEEKLY INTRAVAGINALLY.   diphenhydrAMINE (BENADRYL) 25 MG tablet Take 25 mg by mouth at bedtime.   fluticasone (FLONASE) 50 MCG/ACT nasal spray Place 2 sprays into both nostrils daily.   furosemide (LASIX) 20 MG tablet Take 1 tablet (20 mg total) by mouth daily as needed.   levothyroxine (SYNTHROID) 75 MCG tablet TAKE 1 TABLET BY MOUTH EVERY DAY BEFORE BREAKFAST   losartan (COZAAR) 25 MG tablet Take 0.5 tablets (12.5 mg total) by mouth daily.   metoprolol succinate (TOPROL-XL) 25 MG 24 hr tablet Take 0.5 tablets (12.5 mg total) by mouth daily.   Multiple Vitamins-Minerals (CENTRUM SILVER 50+WOMEN PO) Take 1 tablet by mouth daily at 6 (six) AM.   nitroGLYCERIN (NITROSTAT) 0.4 MG SL tablet Place 1 tablet (0.4 mg total) under the tongue every 5 (five) minutes as needed for chest pain.   pantoprazole (PROTONIX) 40 MG tablet Take 1 tablet (40 mg total) by mouth daily.   PROAIR RESPICLICK 108 (90 Base) MCG/ACT AEPB INHALE 2 PUFFS INTO THE LUNGS EVERY 6 (SIX) HOURS AS NEEDED (SOB/WHEEZING).   RESTASIS 0.05 % ophthalmic emulsion Place  1 drop into both eyes 2 (two) times daily.   ticagrelor (BRILINTA) 60 MG TABS tablet TAKE 1 TABLET (60 MG TOTAL) BY MOUTH 2 (TWO) TIMES DAILY.   tiZANidine (ZANAFLEX) 4 MG tablet TAKE 1 TABLET (4 MG TOTAL) BY MOUTH EVERY 8 (EIGHT) HOURS AS NEEDED FOR MUSCLE SPASMS   umeclidinium-vilanterol (ANORO ELLIPTA) 62.5-25 MCG/ACT AEPB Inhale 1 puff into the lungs daily.    Immunization History  Administered Date(s) Administered   Influenza,inj,Quad PF,6+ Mos 05/19/2020   Influenza-Unspecified 07/19/2019, 06/22/2021, 06/12/2022   Moderna SARS-COV2 Booster Vaccination 01/12/2021   Moderna Sars-Covid-2 Vaccination 12/16/2019, 01/13/2020, 08/08/2020, 06/22/2021   Tdap 03/21/2020   Zoster Recombinant(Shingrix) 05/17/2022, 09/30/2022        Objective:     BP 134/82 (BP Location: Left Arm,  Cuff Size: Normal)   Pulse 65   Temp 97.6 F (36.4 C)   Ht 5\' 6"  (1.676 m)   Wt 146 lb 11.2 oz (66.5 kg)   LMP 11/16/2007 (Approximate)   SpO2 97%   BMI 23.68 kg/m   SpO2: 97 % O2 Device: None (Room air)  GENERAL: Well-developed, well-nourished woman in no acute distress.  Fully ambulatory.  No conversational dyspnea. HEAD: Normocephalic, atraumatic.  EYES: Pupils equal, round, reactive to light.  No scleral icterus.  MOUTH: Dentition intact, oral mucosa moist.  No thrush. NECK: Supple. No thyromegaly. Trachea midline. No JVD.  No adenopathy. PULMONARY: Good air entry bilaterally.  Mild dry crackles on the right base, no wheezes or rhonchi. CARDIOVASCULAR: S1 and S2. Regular rate and rhythm.  No rubs, murmurs or gallops heard. ABDOMEN: Benign. MUSCULOSKELETAL: No joint deformity, no clubbing, no edema.  NEUROLOGIC: No focal deficit, no gait disturbance, speech is fluent. SKIN: Intact,warm,dry.  No rashes. PSYCH: Mood and behavior normal.  Pulmonary Function Test [facility] Only     Status: None   Collection Time: 05/06/23  2:34 PM  Result Value Ref Range   FVC-Pre 2.88 L   FVC-%Pred-Pre 82 %   FVC-Post 2.81 L   FVC-%Pred-Post 81 %   FVC-%Change-Post -2 %   FEV1-Pre 1.81 L   FEV1-%Pred-Pre 67 %   FEV1-Post 1.76 L   FEV1-%Pred-Post 65 %   FEV1-%Change-Post -2 %   FEV6-Pre 2.81 L   FEV6-%Pred-Pre 84 %   FEV6-Post 2.72 L   FEV6-%Pred-Post 81 %   FEV6-%Change-Post -3 %   Pre FEV1/FVC ratio 63 %   FEV1FVC-%Pred-Pre 81 %   Post FEV1/FVC ratio 62 %   FEV1FVC-%Change-Post 0 %   Pre FEV6/FVC Ratio 98 %   FEV6FVC-%Pred-Pre 101 %   Post FEV6/FVC ratio 100 %   FEV6FVC-%Pred-Post 103 %   FEV6FVC-%Change-Post 2 %   FEF 25-75 Pre 1.03 L/sec   FEF2575-%Pred-Pre 44 %   FEF 25-75 Post 0.88 L/sec   FEF2575-%Pred-Post 37 %   FEF2575-%Change-Post -14 %   RV 2.83 L   RV % pred 131 %   TLC 5.70 L   TLC % pred 106 %   DLCO unc 13.57 ml/min/mmHg   DLCO unc % pred 63 %   DL/VA 9.14  ml/min/mmHg/L   DL/VA % pred 73 %    Assessment & Plan:     ICD-10-CM   1. Heterozygous alpha 1-antitrypsin deficiency (HCC)- Phenotype MZ  E88.01    We will await alpha-1 level Has had mild decline on lung function May need to start alpha supplementation    2. Stage 2 moderate COPD by GOLD classification (HCC)  J44.9    Continue Anoro 1  puff daily Continue as needed albuterol    3. Former smoker  Z87.891    No relapse     Symptomatically April Raymond is doing well however, her lung function has declined some.  If her alpha-1 level is stable we may recheck in 3 months to determine if she needs alpha-1 supplementation.  This would require repeating spirometry and alpha-1 antitrypsin level.  The patient understands the plan of action and agrees with the same.  Will see her in follow-up in 3 months time for COPD management, she is to contact us prior to that time should any new difficulties arise.Gailen Shelter, MD Advanced Bronchoscopy PCCM [facility]    *This note was dictated using voice recognition software/Dragon.  Despite best efforts to proofread, errors can occur which can change the meaning. Any transcriptional errors that result from this process are unintentional and may not be fully corrected at the time of dictation.

## 2023-05-06 NOTE — Patient Instructions (Signed)
Your lung function appeared to be somewhat decreased when compared to prior.  We will await the level of the alpha to determine if you need to start infusions.  We may determine that we can wait another 3 months to check an abbreviated version of the breathing test to reevaluate your lung function.  I will give you a call with the results and what the next steps should be.  Your lung cancer screening CT looks like there were no significant issues noted however the radiologist has not read it but we will call you with the results.  Your alpha level should be available in another 2 to 3 days.  We will let you know the results.  We will see you in follow-up in 3 months time call sooner should any new problems arise.

## 2023-05-07 ENCOUNTER — Telehealth: Payer: Self-pay

## 2023-05-07 ENCOUNTER — Encounter: Payer: Self-pay | Admitting: Pulmonary Disease

## 2023-05-07 DIAGNOSIS — J449 Chronic obstructive pulmonary disease, unspecified: Secondary | ICD-10-CM

## 2023-05-07 DIAGNOSIS — E8801 Alpha-1-antitrypsin deficiency: Secondary | ICD-10-CM

## 2023-05-07 LAB — PULMONARY FUNCTION TEST ARMC ONLY
DL/VA % pred: 73 %
DL/VA: 3.03 ml/min/mmHg/L
DLCO unc % pred: 63 %
DLCO unc: 13.57 ml/min/mmHg
FEF 25-75 Post: 0.88 L/s
FEF 25-75 Pre: 1.03 L/s
FEF2575-%Change-Post: -14 %
FEF2575-%Pred-Post: 37 %
FEF2575-%Pred-Pre: 44 %
FEV1-%Change-Post: -2 %
FEV1-%Pred-Post: 65 %
FEV1-%Pred-Pre: 67 %
FEV1-Post: 1.76 L
FEV1-Pre: 1.81 L
FEV1FVC-%Change-Post: 0 %
FEV1FVC-%Pred-Pre: 81 %
FEV6-%Change-Post: -3 %
FEV6-%Pred-Post: 81 %
FEV6-%Pred-Pre: 84 %
FEV6-Post: 2.72 L
FEV6-Pre: 2.81 L
FEV6FVC-%Change-Post: 2 %
FEV6FVC-%Pred-Post: 103 %
FEV6FVC-%Pred-Pre: 101 %
FVC-%Change-Post: -2 %
FVC-%Pred-Post: 81 %
FVC-%Pred-Pre: 82 %
FVC-Post: 2.81 L
FVC-Pre: 2.88 L
Post FEV1/FVC ratio: 62 %
Post FEV6/FVC ratio: 100 %
Pre FEV1/FVC ratio: 63 %
Pre FEV6/FVC Ratio: 98 %
RV % pred: 131 %
RV: 2.83 L
TLC % pred: 106 %
TLC: 5.7 L

## 2023-05-07 LAB — ALPHA-1-ANTITRYPSIN: A-1 Antitrypsin, Ser: 87 mg/dL — ABNORMAL LOW (ref 101–187)

## 2023-05-07 NOTE — Telephone Encounter (Signed)
I have notified the patient and I have placed the orders.  Nothing further needed.

## 2023-05-07 NOTE — Telephone Encounter (Signed)
-----   Message from Sarina Ser sent at 05/07/2023  8:35 AM EDT ----- Her alpha-1 level is holding steady, still low but not dangerously low.  She has a follow-up with me in 3 months I would recommend doing a spirometry pre and postbronchodilator before her appointment in 3 months and repeating another alpha level at that time.  If her lung function is confirmed to have been declined then we can consider alpha-1 supplementation as we discussed yesterday.

## 2023-05-08 ENCOUNTER — Ambulatory Visit: Payer: No Typology Code available for payment source | Admitting: Dermatology

## 2023-05-08 ENCOUNTER — Encounter: Payer: Self-pay | Admitting: Dermatology

## 2023-05-08 VITALS — BP 123/70

## 2023-05-08 DIAGNOSIS — D2271 Melanocytic nevi of right lower limb, including hip: Secondary | ICD-10-CM | POA: Diagnosis not present

## 2023-05-08 DIAGNOSIS — W908XXA Exposure to other nonionizing radiation, initial encounter: Secondary | ICD-10-CM

## 2023-05-08 DIAGNOSIS — Z1283 Encounter for screening for malignant neoplasm of skin: Secondary | ICD-10-CM | POA: Diagnosis not present

## 2023-05-08 DIAGNOSIS — L578 Other skin changes due to chronic exposure to nonionizing radiation: Secondary | ICD-10-CM

## 2023-05-08 DIAGNOSIS — L738 Other specified follicular disorders: Secondary | ICD-10-CM

## 2023-05-08 DIAGNOSIS — L814 Other melanin hyperpigmentation: Secondary | ICD-10-CM | POA: Diagnosis not present

## 2023-05-08 DIAGNOSIS — L821 Other seborrheic keratosis: Secondary | ICD-10-CM

## 2023-05-08 DIAGNOSIS — Z872 Personal history of diseases of the skin and subcutaneous tissue: Secondary | ICD-10-CM

## 2023-05-08 DIAGNOSIS — D1801 Hemangioma of skin and subcutaneous tissue: Secondary | ICD-10-CM

## 2023-05-08 DIAGNOSIS — D229 Melanocytic nevi, unspecified: Secondary | ICD-10-CM | POA: Diagnosis not present

## 2023-05-08 NOTE — Patient Instructions (Signed)

## 2023-05-08 NOTE — Progress Notes (Signed)
   Follow-Up Visit   Subjective  April Raymond is a 62 y.o. female who presents for the following: Skin Cancer Screening and Full Body Skin Exam  The patient presents for Total-Body Skin Exam (TBSE) for skin cancer screening and mole check. The patient has spots, moles and lesions to be evaluated, some may be new or changing and the patient may have concern these could be cancer.    The following portions of the chart were reviewed this encounter and updated as appropriate: medications, allergies, medical history  Review of Systems:  No other skin or systemic complaints except as noted in HPI or Assessment and Plan.  Objective  Well appearing patient in no apparent distress; mood and affect are within normal limits.  A full examination was performed including scalp, head, eyes, ears, nose, lips, neck, chest, axillae, abdomen, back, buttocks, bilateral upper extremities, bilateral lower extremities, hands, feet, fingers, toes, fingernails, and toenails. All findings within normal limits unless otherwise noted below.   Relevant physical exam findings are noted in the Assessment and Plan.    Assessment & Plan   SKIN CANCER SCREENING PERFORMED TODAY.  ACTINIC DAMAGE - Chronic condition, secondary to cumulative UV/sun exposure - diffuse scaly erythematous macules with underlying dyspigmentation - Recommend daily broad spectrum sunscreen SPF 30+ to sun-exposed areas, reapply every 2 hours as needed.  - Staying in the shade or wearing long sleeves, sun glasses (UVA+UVB protection) and wide brim hats (4-inch brim around the entire circumference of the hat) are also recommended for sun protection.  - Call for new or changing lesions.  LENTIGINES, SEBORRHEIC KERATOSES, HEMANGIOMAS - Benign normal skin lesions - Benign-appearing - Call for any changes  MELANOCYTIC NEVI - Tan-brown and/or pink-flesh-colored symmetric macules and papules - Benign appearing on exam today - Observation -  Call clinic for new or changing moles - Recommend daily use of broad spectrum spf 30+ sunscreen to sun-exposed areas.  - R 3rd toe 0.5cm medium brown macule - stable  Sebaceous Hyperplasia - Small yellow papules with a central dell on face - Benign-appearing - Observe. Call for changes.  HISTORY OF HIDRADENITIS SUPPURATIVA Exam: groin clear  Treatment Plan: Clear today, hx of surgery in past on both sides of groin.  Observe for recurrence   Return in about 1 year (around 05/07/2024) for TBSE.  I, Ardis Rowan, RMA, am acting as scribe for Elie Goody, MD .   Documentation: I have reviewed the above documentation for accuracy and completeness, and I agree with the above.  Elie Goody, MD

## 2023-05-12 ENCOUNTER — Other Ambulatory Visit: Payer: Self-pay

## 2023-05-12 DIAGNOSIS — Z87891 Personal history of nicotine dependence: Secondary | ICD-10-CM

## 2023-05-12 DIAGNOSIS — Z122 Encounter for screening for malignant neoplasm of respiratory organs: Secondary | ICD-10-CM

## 2023-05-13 ENCOUNTER — Ambulatory Visit: Payer: No Typology Code available for payment source | Admitting: Pulmonary Disease

## 2023-05-13 ENCOUNTER — Other Ambulatory Visit: Payer: Self-pay | Admitting: Pulmonary Disease

## 2023-05-21 ENCOUNTER — Ambulatory Visit
Admission: RE | Admit: 2023-05-21 | Discharge: 2023-05-21 | Disposition: A | Discharge status: Home / Self Care | Payer: BLUE CROSS/BLUE SHIELD | Source: Ambulatory Visit | Attending: Nurse Practitioner | Admitting: Nurse Practitioner

## 2023-05-21 DIAGNOSIS — Z1231 Encounter for screening mammogram for malignant neoplasm of breast: Secondary | ICD-10-CM | POA: Diagnosis present

## 2023-05-22 NOTE — Progress Notes (Signed)
Contacted via MyChart   Normal mammogram, may repeat in one year:)

## 2023-06-15 ENCOUNTER — Encounter: Payer: Self-pay | Admitting: Emergency Medicine

## 2023-06-15 ENCOUNTER — Ambulatory Visit
Admission: EM | Admit: 2023-06-15 | Discharge: 2023-06-15 | Disposition: A | Discharge status: Home / Self Care | Payer: BLUE CROSS/BLUE SHIELD | Attending: Emergency Medicine | Admitting: Emergency Medicine

## 2023-06-15 ENCOUNTER — Other Ambulatory Visit: Payer: Self-pay

## 2023-06-15 DIAGNOSIS — H109 Unspecified conjunctivitis: Secondary | ICD-10-CM

## 2023-06-15 MED ORDER — MOXIFLOXACIN HCL 0.5 % OP SOLN: 1.0000 [drp] | Freq: Three times a day (TID) | OPHTHALMIC | 0 refills | Status: DC

## 2023-06-15 NOTE — ED Provider Notes (Signed)
April Raymond    CSN: [id number] Arrival date & time: 06/15/23  1132      History   Chief Complaint Chief Complaint  Patient presents with   Eye Pain    HPI April Raymond is a 62 y.o. female.   Patient presents for evaluation of left-sided eye pain, drainage with crusting and mild pruritus beginning this morning upon awakening.  Has not attempted treatment.  Denies injury or trauma, light sensitivity or visual disturbance.  No other members of household has similar symptoms, no known sick contacts.  Denies use of contacts or glasses.    Past Medical History:  Diagnosis Date   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 2017 s/p prior MI & PCI RCA ([place]); b. 05/2020 Abnl ETT: 1.5-27mm horizontal ST dep in inflat leads @ peak stress w/ freq PVCs/couplets/bigeminy. HTN response; b. 05/2020 NSTEMI/Cath: LM nl, LAD 78m, LCX 20, RCA 35ost, patent prev placed prox-dist stent. EF 25-35% w/ apical ballooning.   COPD (chronic obstructive pulmonary disease) (HCC)    Dental bridge present    Permanent lower   Hashimoto's disease    Hidradenitis suppurativa    Hydradenitis    Hyperparathyroidism (HCC)    Hypertension    Myocardial infarction (HCC) 2017   Pericardial effusion    a. 05/2020 Echo: sm to mod circumferential peric eff w/o tamponade; b. 07/2020 Mod peric effusion; c. 09/2020 Echo: EF 60-65%, no rwma, small tomod effusion w/o compromise.   Raynaud's syndrome    Stroke [facility])    Takotsubo cardiomyopathy    a. 05/2020 Echo: EF 35-40%, glob HK, though basal wall motion best preserved consistent w/ stress induced CM. Gr1 DD. Nl RV size/fxn. Sm to mod circumferential pericardial effusion w/o tamponade.   Wears contact lenses    Wears hearing aid in left ear     Patient Active Problem List   Diagnosis Date Noted   Heterozygous alpha 1-antitrypsin deficiency (HCC) 09/09/2022   GERD without  esophagitis 09/09/2022   Spondylosis without myelopathy or radiculopathy, thoracic region 01/24/2022   Chronic anticoagulation (BRILINTA) 01/17/2022   DDD (degenerative disc disease), thoracic 01/09/2022   Thoracic facet syndrome 01/09/2022   Cervical facet syndrome 01/09/2022   Cervicalgia 11/19/2021   DDD (degenerative disc disease), cervical 11/19/2021   Grade 1 Anterolisthesis of cervical spine (C7/T1) 11/19/2021   Grade 1 Anterolisthesis of thoracic spine (T2/T3) 11/19/2021   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   Chronic pain syndrome 11/17/2021   Chronic upper back pain (1ry area of Pain) (Right) 10/22/2021   Chronic thoracic back pain (Bilateral) (R>L) 10/22/2021   Chronic neck pain (2ry area of Pain) (Bilateral) (R>L) 10/22/2021   Chronic shoulder pain (3ry area of Pain) (Bilateral) 10/22/2021   Painful cervical range of motion 10/22/2021   Impaired range of motion of cervical spine 10/22/2021   Spasm of thoracic back muscle 10/22/2021   H/O lipoma 05/09/2021   Pericardial effusion 05/02/2021   Stage 2 moderate COPD by GOLD classification (HCC) 01/12/2021   PAD (peripheral artery disease) (HCC) 09/22/2020   Stress-induced cardiomyopathy 05/31/2020   Former smoker 09/30/2019   History of MI (myocardial infarction) 09/30/2019   History of parathyroidectomy 09/30/2019   Coronary artery disease involving native coronary artery of native heart without angina pectoris 09/30/2019   Essential hypertension 09/30/2019   Hyperlipidemia LDL goal <70 09/30/2019   Hashimoto's disease 09/30/2019   Vaginal atrophy  09/30/2019   Hidradenitis suppurativa 09/30/2019    Past Surgical History:  Procedure Laterality Date   BREAST BIOPSY Right 05/04/2021   stereo x clip BENIGN BREAST TISSUE WITH MILD STROMAL FIBROSIS AND HEMORRHAGE   BREAST CYST ASPIRATION Right 05/01/2021   BREAST EXCISIONAL BIOPSY Left    age 71's   CARDIAC CATHETERIZATION     CATARACT EXTRACTION W/PHACO Left  04/22/2022   Procedure: CATARACT EXTRACTION PHACO AND INTRAOCULAR LENS PLACEMENT (IOC) LEFT;  Surgeon: Nevada Crane, MD;  Location: [facility];  Service: Ophthalmology;  Laterality: Left;  1.42 00:19.9   CATARACT EXTRACTION W/PHACO Right 05/06/2022   Procedure: CATARACT EXTRACTION PHACO AND INTRAOCULAR LENS PLACEMENT (IOC) RIGHT 1.83 00:25.4;  Surgeon: Nevada Crane, MD;  Location: [facility];  Service: Ophthalmology;  Laterality: Right;   CHEST WALL RECONSTRUCTION Right 05/09/2021   Procedure: CHEST WALL MASS RESECTION;  Surgeon: Corliss Skains, MD;  Location: [facility] OR;  Service: Thoracic;  Laterality: Right;  posterior   CYST EXCISION  x2   LEFT HEART CATH AND CORONARY ANGIOGRAPHY N/A 05/31/2020   Procedure: LEFT HEART CATH AND CORONARY ANGIOGRAPHY;  Surgeon: Yvonne Kendall, MD;  Location: [facility] INVASIVE CV LAB;  Service: Cardiovascular;  Laterality: N/A;   LIPOMA EXCISION     PARATHYROIDECTOMY     TONSILLECTOMY      OB History   No obstetric history on file.      Home Medications    Prior to Admission medications   Medication Sig Start Date End Date Taking? Authorizing Provider  moxifloxacin (VIGAMOX) 0.5 % ophthalmic solution Place 1 drop into the left eye 3 (three) times daily. 06/15/23  Yes Marcellina Jonsson R, NP  acetaminophen (TYLENOL) 500 MG tablet Take 1,000 mg by mouth daily as needed.    [provider]  Ernestina Patches 62.5-25 MCG/ACT AEPB INHALE 1 PUFF BY MOUTH EVERY DAY 05/13/23   Salena Saner, MD  atorvastatin (LIPITOR) 80 MG tablet Take 1 tablet (80 mg total) by mouth daily. 09/13/22   Cannady, Corrie Dandy T, NP  cetirizine (ZYRTEC) 10 MG tablet Take 10 mg by mouth daily.    [provider]  conjugated estrogens (PREMARIN) vaginal cream PLACE 0.5 GRAMS TWICE WEEKLY INTRAVAGINALLY. 09/13/22   Cannady, Corrie Dandy T, NP  diphenhydrAMINE (BENADRYL) 25 MG tablet Take 25 mg by mouth at bedtime.    [provider]  fluticasone  (FLONASE) 50 MCG/ACT nasal spray Place 2 sprays into both nostrils daily.    [provider]  furosemide (LASIX) 20 MG tablet Take 1 tablet (20 mg total) by mouth daily as needed. 09/13/22   Cannady, Corrie Dandy T, NP  levothyroxine (SYNTHROID) 75 MCG tablet TAKE 1 TABLET BY MOUTH EVERY DAY BEFORE BREAKFAST 09/13/22   Cannady, Corrie Dandy T, NP  losartan (COZAAR) 25 MG tablet Take 0.5 tablets (12.5 mg total) by mouth daily. 09/13/22   Cannady, Corrie Dandy T, NP  metoprolol succinate (TOPROL-XL) 25 MG 24 hr tablet Take 0.5 tablets (12.5 mg total) by mouth daily. 09/13/22   Cannady, Corrie Dandy T, NP  Multiple Vitamins-Minerals (CENTRUM SILVER 50+WOMEN PO) Take 1 tablet by mouth daily at 6 (six) AM.    [provider]  nitroGLYCERIN (NITROSTAT) 0.4 MG SL tablet Place 1 tablet (0.4 mg total) under the tongue every 5 (five) minutes as needed for chest pain. 03/25/22   Cannady, Corrie Dandy T, NP  pantoprazole (PROTONIX) 40 MG tablet Take 1 tablet (40 mg total) by mouth daily. 09/13/22   Marjie Skiff, NP  PROAIR RESPICLICK 108 (90 Base) MCG/ACT AEPB INHALE 2 PUFFS INTO THE LUNGS EVERY 6 (SIX) HOURS AS NEEDED (SOB/WHEEZING). 04/18/22   Salena Saner, MD  RESTASIS 0.05 % ophthalmic emulsion Place 1 drop into both eyes 2 (two) times daily. 01/29/23   [provider]  ticagrelor (BRILINTA) 60 MG TABS tablet TAKE 1 TABLET (60 MG TOTAL) BY MOUTH 2 (TWO) TIMES DAILY. 09/13/22   Cannady, Corrie Dandy T, NP  tiZANidine (ZANAFLEX) 4 MG tablet TAKE 1 TABLET (4 MG TOTAL) BY MOUTH EVERY 8 (EIGHT) HOURS AS NEEDED FOR MUSCLE SPASMS 04/10/23   Aura Dials T, NP    Family History Family History  Problem Relation Age of Onset   Heart attack Mother 36   COPD Mother    Stroke Mother    Diverticulitis Mother    Cancer Mother        breast   Hypertension Mother    Breast cancer Mother 31   Hypertension Father    Stroke Maternal Grandfather    Alzheimer's disease Paternal Grandmother    Heart attack Paternal  Grandfather    Breast cancer Cousin        mat cousin x 2   Hypertension Brother     Social History Social History   Tobacco Use   Smoking status: Former    Current packs/day: 0.00    Average packs/day: 1 pack/day for 40.0 years (40.0 ttl pk-yrs)    Types: Cigarettes    Start date: 09/18/1975    Quit date: 09/18/2015    Years since quitting: 7.7   Smokeless tobacco: Never  Vaping Use   Vaping status: Never Used  Substance Use Topics   Alcohol use: Yes    Alcohol/week: 6.0 standard drinks of alcohol    Types: 6 Standard drinks or equivalent per week    Comment: 2-3 glasses wine, 2-3x/wk   Drug use: Never     Allergies   Azithromycin, Humira [adalimumab], and Tape   Review of Systems Review of Systems   Physical Exam Triage Vital Signs ED Triage Vitals  Encounter Vitals Group     BP 06/15/23 1256 (!) 140/76     Systolic BP Percentile --      Diastolic BP Percentile --      Pulse Rate 06/15/23 1256 72     Resp 06/15/23 1256 18     Temp 06/15/23 1256 98.3 F (36.8 C)     Temp Source 06/15/23 1256 Oral     SpO2 06/15/23 1256 99 %     Weight --      Height --      Head Circumference --      Peak Flow --      Pain Score 06/15/23 1300 7     Pain Loc --      Pain Education --      Exclude from Growth Chart --    No data found.  Updated Vital Signs BP (!) 140/76   Pulse 72   Temp 98.3 F (36.8 C) (Oral)   Resp 18   LMP 11/16/2007 (Approximate)   SpO2 99%   Visual Acuity Right Eye Distance:   Left Eye Distance:   Bilateral Distance:    Right Eye Near:   Left Eye Near:    Bilateral Near:     Physical Exam Constitutional:      Appearance: Normal appearance.  Eyes:     Comments: Mild erythema present to the left conjunctiva, no drainage noted on exam, vision  grossly intact, extraocular movements intact  Pulmonary:     Effort: Pulmonary effort is normal.  Neurological:     Mental Status: She is alert and oriented to person, place, and time. Mental  status is at baseline.      UC Treatments / Results  Labs (all labs ordered are listed, but only abnormal results are displayed) Labs Reviewed - No data to display  EKG   Radiology No results found.  Procedures Procedures (including critical care time)  Medications Ordered in UC Medications - No data to display  Initial Impression / Assessment and Plan / UC Course  I have reviewed the triage vital signs and the nursing notes.  Pertinent labs & imaging results that were available during my care of the patient were reviewed by me and considered in my medical decision making (see chart for details).  Bacterial conjunctivitis of left eye  Presentation and symptomology consistent with infection, discussed with patient and spouse, prescribed moxifloxacin and discussed administration, as direct eye touching or rubbing, recommended oral antihistamines for management of pruritus and advised against use of make-up until symptoms resolved, may use cool compresses for removal of drainage and advise follow-up with urgent care or ophthalmology if symptoms continue to persist or worsen, walking referral given Final Clinical Impressions(s) / UC Diagnoses   Final diagnoses:  Bacterial conjunctivitis of left eye     Discharge Instructions      Today you being treated for bacterial conjunctivitis.   Place one drop of moxifloxacin into the left eye every 8 hours for 7 days. if the other eye starts to have symptoms you may use medication in it as well. Do not allow tip of dropper to touch eye.  May use cool compress for comfort and to remove discharge if present. Pat the eye, do not wipe.  Do not rub eyes, this may cause more irritation.  May use ferritin, Zyrtec or benadryl as needed to help if itching present.  Please avoid use of eye makeup until symptoms clear.  If symptoms persist after use of medication, please follow up at Urgent Care or with ophthalmologist (eye  doctor)    ED Prescriptions     Medication Sig Dispense Auth. Provider   moxifloxacin (VIGAMOX) 0.5 % ophthalmic solution Place 1 drop into the left eye 3 (three) times daily. 3 mL Valinda Hoar, NP      PDMP not reviewed this encounter.   Valinda Hoar, [place] 06/18/23 [phone]

## 2023-06-15 NOTE — Discharge Instructions (Signed)
Today you being treated for bacterial conjunctivitis.   Place one drop of moxifloxacin into the left eye every 8 hours for 7 days. if the other eye starts to have symptoms you may use medication in it as well. Do not allow tip of dropper to touch eye.  May use cool compress for comfort and to remove discharge if present. Pat the eye, do not wipe.  Do not rub eyes, this may cause more irritation.  May use ferritin, Zyrtec or benadryl as needed to help if itching present.  Please avoid use of eye makeup until symptoms clear.  If symptoms persist after use of medication, please follow up at Urgent Care or with ophthalmologist (eye doctor)

## 2023-08-05 ENCOUNTER — Ambulatory Visit: Payer: BLUE CROSS/BLUE SHIELD | Attending: Pulmonary Disease

## 2023-08-05 DIAGNOSIS — J449 Chronic obstructive pulmonary disease, unspecified: Secondary | ICD-10-CM | POA: Insufficient documentation

## 2023-08-05 LAB — PULMONARY FUNCTION TEST ARMC ONLY
FEF 25-75 Post: 1.12 L/s
FEF 25-75 Pre: 1.1 L/s
FEF2575-%Change-Post: 1 %
FEF2575-%Pred-Post: 47 %
FEF2575-%Pred-Pre: 47 %
FEV1-%Change-Post: -5 %
FEV1-%Pred-Post: 76 %
FEV1-%Pred-Pre: 81 %
FEV1-Post: 2.04 L
FEV1-Pre: 2.17 L
FEV1FVC-%Change-Post: -5 %
FEV1FVC-%Pred-Pre: 87 %
FEV6-%Change-Post: 0 %
FEV6-%Pred-Post: 93 %
FEV6-%Pred-Pre: 94 %
FEV6-Post: 3.13 L
FEV6-Pre: 3.15 L
FEV6FVC-%Change-Post: 0 %
FEV6FVC-%Pred-Post: 101 %
FEV6FVC-%Pred-Pre: 102 %
FVC-%Change-Post: 0 %
FVC-%Pred-Post: 92 %
FVC-%Pred-Pre: 92 %
FVC-Post: 3.19 L
FVC-Pre: 3.2 L
Post FEV1/FVC ratio: 64 %
Post FEV6/FVC ratio: 98 %
Pre FEV1/FVC ratio: 68 %
Pre FEV6/FVC Ratio: 99 %

## 2023-08-05 MED ORDER — ALBUTEROL SULFATE (2.5 MG/3ML) 0.083% IN NEBU
2.5000 mg | INHALATION_SOLUTION | Freq: Once | RESPIRATORY_TRACT | Status: AC
  Administered 2023-08-05: 2.5 mg via RESPIRATORY_TRACT
  Filled 2023-08-05: qty 3

## 2023-08-13 ENCOUNTER — Ambulatory Visit: Payer: No Typology Code available for payment source | Admitting: Pulmonary Disease

## 2023-08-29 ENCOUNTER — Encounter: Payer: Self-pay | Admitting: Nurse Practitioner

## 2023-09-03 ENCOUNTER — Ambulatory Visit
Admission: EM | Admit: 2023-09-03 | Discharge: 2023-09-03 | Disposition: A | Discharge status: Home / Self Care | Payer: BLUE CROSS/BLUE SHIELD | Attending: Emergency Medicine | Admitting: Emergency Medicine

## 2023-09-03 ENCOUNTER — Encounter: Payer: Self-pay | Admitting: Emergency Medicine

## 2023-09-03 DIAGNOSIS — J029 Acute pharyngitis, unspecified: Secondary | ICD-10-CM

## 2023-09-03 LAB — POCT RAPID STREP A (OFFICE): Rapid Strep A Screen: NEGATIVE

## 2023-09-03 MED ORDER — PREDNISONE 10 MG (21) PO TBPK: ORAL_TABLET | Freq: Every day | ORAL | 0 refills | Status: DC

## 2023-09-03 NOTE — Discharge Instructions (Addendum)
The strep test is negative.  Take the prednisone as directed.  Follow-up with your primary care provider if your symptoms are not improving.

## 2023-09-03 NOTE — ED Provider Notes (Signed)
April Raymond    CSN: [id number] Arrival date & time: 09/03/23  1425      History   Chief Complaint No chief complaint on file.   HPI April Raymond is a 62 y.o. female.  Patient presents with sore throat x 3 to 4 weeks.  She reports exposure to strep through her daughter.  She denies fever, chills, rash, cough, usual shortness of breath, or other symptoms.  She has been treating her sore throat with salt water gargles.  The history is provided by the patient and medical records.    Past Medical History:  Diagnosis Date   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 2017 s/p prior MI & PCI RCA ([place]); b. 05/2020 Abnl ETT: 1.5-64mm horizontal ST dep in inflat leads @ peak stress w/ freq PVCs/couplets/bigeminy. HTN response; b. 05/2020 NSTEMI/Cath: LM nl, LAD 56m, LCX 20, RCA 35ost, patent prev placed prox-dist stent. EF 25-35% w/ apical ballooning.   COPD (chronic obstructive pulmonary disease) (HCC)    Dental bridge present    Permanent lower   Hashimoto's disease    Hidradenitis suppurativa    Hydradenitis    Hyperparathyroidism (HCC)    Hypertension    Myocardial infarction (HCC) 2017   Pericardial effusion    a. 05/2020 Echo: sm to mod circumferential peric eff w/o tamponade; b. 07/2020 Mod peric effusion; c. 09/2020 Echo: EF 60-65%, no rwma, small tomod effusion w/o compromise.   Raynaud's syndrome    Stroke [facility])    Takotsubo cardiomyopathy    a. 05/2020 Echo: EF 35-40%, glob HK, though basal wall motion best preserved consistent w/ stress induced CM. Gr1 DD. Nl RV size/fxn. Sm to mod circumferential pericardial effusion w/o tamponade.   Wears contact lenses    Wears hearing aid in left ear     Patient Active Problem List   Diagnosis Date Noted   Heterozygous alpha 1-antitrypsin deficiency (HCC) 09/09/2022   GERD without esophagitis 09/09/2022   Spondylosis without myelopathy  or radiculopathy, thoracic region 01/24/2022   Chronic anticoagulation (BRILINTA) 01/17/2022   DDD (degenerative disc disease), thoracic 01/09/2022   Thoracic facet syndrome 01/09/2022   Cervical facet syndrome 01/09/2022   Cervicalgia 11/19/2021   DDD (degenerative disc disease), cervical 11/19/2021   Grade 1 Anterolisthesis of cervical spine (C7/T1) 11/19/2021   Grade 1 Anterolisthesis of thoracic spine (T2/T3) 11/19/2021   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   Chronic pain syndrome 11/17/2021   Chronic upper back pain (1ry area of Pain) (Right) 10/22/2021   Chronic thoracic back pain (Bilateral) (R>L) 10/22/2021   Chronic neck pain (2ry area of Pain) (Bilateral) (R>L) 10/22/2021   Chronic shoulder pain (3ry area of Pain) (Bilateral) 10/22/2021   Painful cervical range of motion 10/22/2021   Impaired range of motion of cervical spine 10/22/2021   Spasm of thoracic back muscle 10/22/2021   H/O lipoma 05/09/2021   Pericardial effusion 05/02/2021   Stage 2 moderate COPD by GOLD classification (HCC) 01/12/2021   PAD (peripheral artery disease) (HCC) 09/22/2020   Stress-induced cardiomyopathy 05/31/2020   Former smoker 09/30/2019   History of MI (myocardial infarction) 09/30/2019   History of parathyroidectomy 09/30/2019   Coronary artery disease involving native coronary artery of native heart without angina pectoris 09/30/2019   Essential hypertension 09/30/2019   Hyperlipidemia LDL goal <70 09/30/2019   Hashimoto's disease 09/30/2019   Vaginal atrophy 09/30/2019   Hidradenitis suppurativa  09/30/2019    Past Surgical History:  Procedure Laterality Date   BREAST BIOPSY Right 05/04/2021   stereo x clip BENIGN BREAST TISSUE WITH MILD STROMAL FIBROSIS AND HEMORRHAGE   BREAST CYST ASPIRATION Right 05/01/2021   BREAST EXCISIONAL BIOPSY Left    age 27's   CARDIAC CATHETERIZATION     CATARACT EXTRACTION W/PHACO Left 04/22/2022   Procedure: CATARACT EXTRACTION PHACO AND  INTRAOCULAR LENS PLACEMENT (IOC) LEFT;  Surgeon: Nevada Crane, MD;  Location: [facility];  Service: Ophthalmology;  Laterality: Left;  1.42 00:19.9   CATARACT EXTRACTION W/PHACO Right 05/06/2022   Procedure: CATARACT EXTRACTION PHACO AND INTRAOCULAR LENS PLACEMENT (IOC) RIGHT 1.83 00:25.4;  Surgeon: Nevada Crane, MD;  Location: [facility];  Service: Ophthalmology;  Laterality: Right;   CHEST WALL RECONSTRUCTION Right 05/09/2021   Procedure: CHEST WALL MASS RESECTION;  Surgeon: Corliss Skains, MD;  Location: [facility] OR;  Service: Thoracic;  Laterality: Right;  posterior   CYST EXCISION  x2   LEFT HEART CATH AND CORONARY ANGIOGRAPHY N/A 05/31/2020   Procedure: LEFT HEART CATH AND CORONARY ANGIOGRAPHY;  Surgeon: Yvonne Kendall, MD;  Location: [facility] INVASIVE CV LAB;  Service: Cardiovascular;  Laterality: N/A;   LIPOMA EXCISION     PARATHYROIDECTOMY     TONSILLECTOMY      OB History   No obstetric history on file.      Home Medications    Prior to Admission medications   Medication Sig Start Date End Date Taking? Authorizing Provider  predniSONE (STERAPRED UNI-PAK 21 TAB) 10 MG (21) TBPK tablet Take by mouth daily. As directed 09/03/23  Yes Mickie Bail, NP  acetaminophen (TYLENOL) 500 MG tablet Take 1,000 mg by mouth daily as needed.    [provider]  Ernestina Patches 62.5-25 MCG/ACT AEPB INHALE 1 PUFF BY MOUTH EVERY DAY 05/13/23   Salena Saner, MD  atorvastatin (LIPITOR) 80 MG tablet Take 1 tablet (80 mg total) by mouth daily. 09/13/22   Cannady, Corrie Dandy T, NP  cetirizine (ZYRTEC) 10 MG tablet Take 10 mg by mouth daily.    [provider]  conjugated estrogens (PREMARIN) vaginal cream PLACE 0.5 GRAMS TWICE WEEKLY INTRAVAGINALLY. 09/13/22   Cannady, Corrie Dandy T, NP  diphenhydrAMINE (BENADRYL) 25 MG tablet Take 25 mg by mouth at bedtime.    [provider]  fluticasone (FLONASE) 50 MCG/ACT nasal spray Place 2 sprays into both  nostrils daily.    [provider]  furosemide (LASIX) 20 MG tablet Take 1 tablet (20 mg total) by mouth daily as needed. 09/13/22   Cannady, Corrie Dandy T, NP  levothyroxine (SYNTHROID) 75 MCG tablet TAKE 1 TABLET BY MOUTH EVERY DAY BEFORE BREAKFAST 09/13/22   Cannady, Corrie Dandy T, NP  losartan (COZAAR) 25 MG tablet Take 0.5 tablets (12.5 mg total) by mouth daily. 09/13/22   Cannady, Corrie Dandy T, NP  metoprolol succinate (TOPROL-XL) 25 MG 24 hr tablet Take 0.5 tablets (12.5 mg total) by mouth daily. 09/13/22   Cannady, Corrie Dandy T, NP  moxifloxacin (VIGAMOX) 0.5 % ophthalmic solution Place 1 drop into the left eye 3 (three) times daily. 06/15/23   White, Elita Boone, NP  Multiple Vitamins-Minerals (CENTRUM SILVER 50+WOMEN PO) Take 1 tablet by mouth daily at 6 (six) AM.    [provider]  nitroGLYCERIN (NITROSTAT) 0.4 MG SL tablet Place 1 tablet (0.4 mg total) under the tongue every 5 (five) minutes as needed for chest pain. 03/25/22   Cannady, Corrie Dandy T, NP  pantoprazole (PROTONIX) 40  MG tablet Take 1 tablet (40 mg total) by mouth daily. 09/13/22   Cannady, Dorie Rank, NP  PROAIR RESPICLICK 108 (90 Base) MCG/ACT AEPB INHALE 2 PUFFS INTO THE LUNGS EVERY 6 (SIX) HOURS AS NEEDED (SOB/WHEEZING). 04/18/22   Salena Saner, MD  RESTASIS 0.05 % ophthalmic emulsion Place 1 drop into both eyes 2 (two) times daily. 01/29/23   [provider]  ticagrelor (BRILINTA) 60 MG TABS tablet TAKE 1 TABLET (60 MG TOTAL) BY MOUTH 2 (TWO) TIMES DAILY. 09/13/22   Cannady, Corrie Dandy T, NP  tiZANidine (ZANAFLEX) 4 MG tablet TAKE 1 TABLET (4 MG TOTAL) BY MOUTH EVERY 8 (EIGHT) HOURS AS NEEDED FOR MUSCLE SPASMS 04/10/23   Aura Dials T, NP    Family History Family History  Problem Relation Age of Onset   Heart attack Mother 8   COPD Mother    Stroke Mother    Diverticulitis Mother    Cancer Mother        breast   Hypertension Mother    Breast cancer Mother 63   Hypertension Father    Stroke Maternal  Grandfather    Alzheimer's disease Paternal Grandmother    Heart attack Paternal Grandfather    Breast cancer Cousin        mat cousin x 2   Hypertension Brother     Social History Social History   Tobacco Use   Smoking status: Former    Current packs/day: 0.00    Average packs/day: 1 pack/day for 40.0 years (40.0 ttl pk-yrs)    Types: Cigarettes    Start date: 09/18/1975    Quit date: 09/18/2015    Years since quitting: 7.9   Smokeless tobacco: Never  Vaping Use   Vaping status: Never Used  Substance Use Topics   Alcohol use: Yes    Alcohol/week: 6.0 standard drinks of alcohol    Types: 6 Standard drinks or equivalent per week    Comment: 2-3 glasses wine, 2-3x/wk   Drug use: Never     Allergies   Azithromycin, Humira [adalimumab], and Tape   Review of Systems Review of Systems  Constitutional:  Negative for chills and fever.  HENT:  Positive for sore throat. Negative for ear pain.   Respiratory:  Negative for cough and shortness of breath.      Physical Exam Triage Vital Signs ED Triage Vitals [09/03/23 1436]  Encounter Vitals Group     BP 134/78     Systolic BP Percentile      Diastolic BP Percentile      Pulse Rate 70     Resp 18     Temp 98.5 F (36.9 C)     Temp src      SpO2 96 %     Weight      Height      Head Circumference      Peak Flow      Pain Score      Pain Loc      Pain Education      Exclude from Growth Chart    No data found.  Updated Vital Signs BP 134/78   Pulse 70   Temp 98.5 F (36.9 C)   Resp 18   LMP 11/16/2007 (Approximate)   SpO2 96%   Visual Acuity Right Eye Distance:   Left Eye Distance:   Bilateral Distance:    Right Eye Near:   Left Eye Near:    Bilateral Near:     Physical Exam Constitutional:  General: She is not in acute distress. HENT:     Right Ear: Tympanic membrane normal.     Left Ear: Tympanic membrane normal.     Nose: Nose normal.     Mouth/Throat:     Mouth: Mucous membranes are  moist.     Pharynx: Posterior oropharyngeal erythema present.     Comments: Mild erythema.  Clear PND. Cardiovascular:     Rate and Rhythm: Normal rate and regular rhythm.     Heart sounds: Normal heart sounds.  Pulmonary:     Effort: Pulmonary effort is normal. No respiratory distress.     Breath sounds: Normal breath sounds.  Skin:    General: Skin is warm and dry.  Neurological:     Mental Status: She is alert.      UC Treatments / Results  Labs (all labs ordered are listed, but only abnormal results are displayed) Labs Reviewed  POCT RAPID STREP A (OFFICE)    EKG   Radiology No results found.  Procedures Procedures (including critical care time)  Medications Ordered in UC Medications - No data to display  Initial Impression / Assessment and Plan / UC Course  I have reviewed the triage vital signs and the nursing notes.  Pertinent labs & imaging results that were available during my care of the patient were reviewed by me and considered in my medical decision making (see chart for details).   Viral pharyngitis.  Rapid strep negative.  Patient has been symptomatic for 3 to 4 weeks.  Treating today with prednisone taper.  Discussed daily Zyrtec.  Instructed her to follow-up with her PCP if she is not improving.  She agrees to plan of care.   Final Clinical Impressions(s) / UC Diagnoses   Final diagnoses:  Viral pharyngitis     Discharge Instructions      The strep test is negative.  Take the prednisone as directed.  Follow-up with your primary care provider if your symptoms are not improving.      ED Prescriptions     Medication Sig Dispense Auth. Provider   predniSONE (STERAPRED UNI-PAK 21 TAB) 10 MG (21) TBPK tablet Take by mouth daily. As directed 21 tablet Mickie Bail, NP      PDMP not reviewed this encounter.   Mickie Bail, NP 09/03/23 [phone]

## 2023-09-19 ENCOUNTER — Encounter: Payer: No Typology Code available for payment source | Admitting: Nurse Practitioner

## 2023-09-19 DIAGNOSIS — I252 Old myocardial infarction: Secondary | ICD-10-CM

## 2023-09-19 DIAGNOSIS — K219 Gastro-esophageal reflux disease without esophagitis: Secondary | ICD-10-CM

## 2023-09-19 DIAGNOSIS — Z0000 Encounter for general adult medical examination without abnormal findings: Secondary | ICD-10-CM

## 2023-09-19 DIAGNOSIS — E8801 Alpha-1-antitrypsin deficiency: Secondary | ICD-10-CM

## 2023-09-19 DIAGNOSIS — I10 Essential (primary) hypertension: Secondary | ICD-10-CM

## 2023-09-19 DIAGNOSIS — E063 Autoimmune thyroiditis: Secondary | ICD-10-CM

## 2023-09-19 DIAGNOSIS — I2510 Atherosclerotic heart disease of native coronary artery without angina pectoris: Secondary | ICD-10-CM

## 2023-09-19 DIAGNOSIS — Z124 Encounter for screening for malignant neoplasm of cervix: Secondary | ICD-10-CM

## 2023-09-19 DIAGNOSIS — E785 Hyperlipidemia, unspecified: Secondary | ICD-10-CM

## 2023-09-19 DIAGNOSIS — J449 Chronic obstructive pulmonary disease, unspecified: Secondary | ICD-10-CM

## 2023-09-19 DIAGNOSIS — I739 Peripheral vascular disease, unspecified: Secondary | ICD-10-CM

## 2023-10-01 ENCOUNTER — Ambulatory Visit: Payer: No Typology Code available for payment source | Admitting: Pulmonary Disease

## 2023-10-01 ENCOUNTER — Encounter: Payer: Self-pay | Admitting: Pulmonary Disease

## 2023-10-01 VITALS — BP 124/74 | HR 76 | Temp 97.1°F | Ht 66.0 in | Wt 147.6 lb

## 2023-10-01 DIAGNOSIS — J449 Chronic obstructive pulmonary disease, unspecified: Secondary | ICD-10-CM | POA: Diagnosis not present

## 2023-10-01 DIAGNOSIS — E8801 Alpha-1-antitrypsin deficiency: Secondary | ICD-10-CM

## 2023-10-01 DIAGNOSIS — Z87891 Personal history of nicotine dependence: Secondary | ICD-10-CM

## 2023-10-01 NOTE — Progress Notes (Signed)
 Subjective:    Patient ID: April Raymond, female    DOB: [birth year], 63 y.o.   MRN: [id number]  Patient Care Team: Lemar Pyles, NP as PCP - General (Nurse Practitioner) End, Veryl Gottron, MD as PCP - Cardiology (Cardiology)  Chief Complaint  Patient presents with   Follow-up    No Sob, wheezing or cough    BACKGROUND/INTERVAL:April Raymond is a 63 year old former smoker with a 40-pack-year history of smoking, alpha-1 MZ phenotype with mild deficiency and dyspnea who presents for follow-up.  This is a scheduled visit.  She was last seen on 06 May 2023.  At that time she had an alpha level of 87 mg/dL.   HPI Discussed the use of AI scribe software for clinical note transcription with the patient, who gave verbal consent to proceed.  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 She reports no recent issues with shortness of breath, wheezing, or cough. Her breathing tests have shown improvement compared to previous results, and she has not required supplementation for her alpha 1 antitrypsin deficiency.  The patient has been proactive in her health management, notably quitting smoking due to fear of hospitalization. She has also received her flu and COVID vaccinations.  She remains very active walking and doing aerobic exercise daily.  The patient's last alpha 1 antitrypsin level check was in August 2024. She is due for a lung cancer screening in mid-August of the current year. The patient's overall condition appears stable, with no new symptoms or complications reported.     DATA: 10/05/2020 2D echo: Small to moderate size pericardial effusion without hemodynamic compromise, LVEF 60 to 65%. Indeterminate diastolics.  Normal RV function.  No valvular abnormality 10/17/2020 PFTs: FEV1 1.71 L or 62% predicted, FVC 2.73 L or 76% predicted, FEV1/FVC 62%, no bronchodilator response.  Diffusion capacity mildly reduced.  Consistent with moderate COPD. 12/04/2020 alpha-1-antitrypsin: Phenotype MZ, level 80 mg/dl. 04/29/2022 alpha 1 antitrypsin level: 87 mg/dL 40/98/1191 PFTs: FEV1 4.78 L or 81% predicted, FVC 3.17 L or 90% predicted, FEV1/FVC 69%, no bronchodilator response.  There is mild hyperinflation and moderate air trapping on lung volumes.  Diffusion capacity moderately reduced.  Compared to 17 October 2020 no significant change.  Study consistent with mild obstructive airways disease with moderate diffusion defect. 11/05/2022 alpha-1 antitrypsin level: 89 mg/dL 29/56/2130 echocardiogram: LVEF 60 to 65%, no wall motion abnormalities, normal right ventricular systolic function.  Normal pulmonary artery systolic pressure.  Small to moderate size pericardial effusion, circumferential, no evidence of cardiac tamponade.  Mild mitral regurgitation. 05/06/2023 alpha 1 antitrypsin level: 87 mg/dL 86/57/8469 PFTs: Limited to spirometry pre and post BD.  FEV1 2.17 L 81% predicted, VC 3.20 L or 92% predicted, FEV1/FVC 68%.  No bronchodilator response.  Review of Systems A 10 point review of systems was performed and it is as noted above otherwise negative.   Patient Active Problem List   Diagnosis Date Noted   Heterozygous alpha 1-antitrypsin deficiency (HCC) 09/09/2022   GERD without esophagitis 09/09/2022   Spondylosis without myelopathy or radiculopathy, thoracic region 01/24/2022   Chronic anticoagulation (BRILINTA ) 01/17/2022   DDD (degenerative disc disease), thoracic 01/09/2022   Thoracic facet syndrome 01/09/2022   Cervical facet syndrome 01/09/2022   Cervicalgia 11/19/2021   DDD (degenerative disc disease), cervical 11/19/2021   Grade 1 Anterolisthesis of cervical spine (C7/T1) 11/19/2021   Grade 1 Anterolisthesis of thoracic spine (T2/T3) 11/19/2021  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   Chronic pain syndrome 11/17/2021   Chronic upper back pain (1ry area of  Pain) (Right) 10/22/2021   Chronic thoracic back pain (Bilateral) (R>L) 10/22/2021   Chronic neck pain (2ry area of Pain) (Bilateral) (R>L) 10/22/2021   Chronic shoulder pain (3ry area of Pain) (Bilateral) 10/22/2021   Painful cervical range of motion 10/22/2021   Impaired range of motion of cervical spine 10/22/2021   Spasm of thoracic back muscle 10/22/2021   H/O lipoma 05/09/2021   Stage 2 moderate COPD by GOLD classification (HCC) 01/12/2021   PAD (peripheral artery disease) (HCC) 09/22/2020   Stress-induced cardiomyopathy 05/31/2020   Former smoker 09/30/2019   History of MI (myocardial infarction) 09/30/2019   History of parathyroidectomy 09/30/2019   Coronary artery disease involving native coronary artery of native heart without angina pectoris 09/30/2019   Essential hypertension 09/30/2019   Hyperlipidemia LDL goal <70 09/30/2019   Hashimoto's disease 09/30/2019   Vaginal atrophy 09/30/2019   Hidradenitis suppurativa 09/30/2019    Social History   Tobacco Use   Smoking status: Former    Current packs/day: 0.00    Average packs/day: 1 pack/day for 40.0 years (40.0 ttl pk-yrs)    Types: Cigarettes    Start date: 09/18/1975    Quit date: 09/18/2015    Years since quitting: 8.0   Smokeless tobacco: Never  Substance Use Topics   Alcohol use: Yes    Alcohol/week: 6.0 standard drinks of alcohol    Types: 6 Standard drinks or equivalent per week    Comment: 2-3 glasses wine, 2-3x/wk    Allergies  Allergen Reactions   Azithromycin  Rash   Humira [Adalimumab] Rash   Tape Rash    Current Meds  Medication Sig   acetaminophen  (TYLENOL ) 500 MG tablet Take 1,000 mg by mouth daily as needed.   ANORO ELLIPTA  62.5-25 MCG/ACT AEPB INHALE 1 PUFF BY MOUTH EVERY DAY   atorvastatin  (LIPITOR ) 80 MG tablet Take 1 tablet (80 mg total) by mouth daily.   cetirizine (ZYRTEC) 10 MG tablet Take 10 mg by mouth daily.   conjugated estrogens  (PREMARIN ) vaginal cream PLACE 0.5 GRAMS TWICE  WEEKLY INTRAVAGINALLY.   diphenhydrAMINE  (BENADRYL ) 25 MG tablet Take 25 mg by mouth at bedtime.   fluticasone (FLONASE) 50 MCG/ACT nasal spray Place 2 sprays into both nostrils daily.   furosemide  (LASIX ) 20 MG tablet Take 1 tablet (20 mg total) by mouth daily as needed.   levothyroxine  (SYNTHROID ) 75 MCG tablet TAKE 1 TABLET BY MOUTH EVERY DAY BEFORE BREAKFAST   losartan  (COZAAR ) 25 MG tablet Take 0.5 tablets (12.5 mg total) by mouth daily.   metoprolol  succinate (TOPROL -XL) 25 MG 24 hr tablet Take 0.5 tablets (12.5 mg total) by mouth daily.   Multiple Vitamins-Minerals (CENTRUM SILVER 50+WOMEN PO) Take 1 tablet by mouth daily at 6 (six) AM.   nitroGLYCERIN  (NITROSTAT ) 0.4 MG SL tablet Place 1 tablet (0.4 mg total) under the tongue every 5 (five) minutes as needed for chest pain.   pantoprazole  (PROTONIX ) 40 MG tablet Take 1 tablet (40 mg total) by mouth daily.   PROAIR  RESPICLICK 108 (90 Base) MCG/ACT AEPB INHALE 2 PUFFS INTO THE LUNGS EVERY 6 (SIX) HOURS AS NEEDED (SOB/WHEEZING).   ticagrelor  (BRILINTA ) 60 MG TABS tablet TAKE 1 TABLET (60 MG TOTAL) BY MOUTH 2 (TWO) TIMES DAILY.   tiZANidine  (ZANAFLEX ) 4 MG tablet TAKE 1 TABLET (4 MG TOTAL) BY MOUTH EVERY 8 (EIGHT) HOURS AS NEEDED FOR MUSCLE SPASMS    Immunization History  Administered Date(s) Administered   Influenza, Mdck, Trivalent,PF 6+ MOS(egg free) 07/03/2023   Influenza,inj,Quad PF,6+ Mos 05/19/2020   Influenza-Unspecified 07/19/2019, 06/22/2021, 06/12/2022   Moderna SARS-COV2 Booster Vaccination 01/12/2021   Moderna Sars-Covid-2 Vaccination 12/16/2019, 01/13/2020, 08/08/2020, 06/22/2021   Pfizer(Comirnaty)Fall Seasonal Vaccine 12 years and older 07/03/2023   Tdap 03/21/2020   Zoster Recombinant(Shingrix ) 05/17/2022, 09/30/2022        Objective:   BP 124/74 (BP Location: Right Arm, Cuff Size: Normal)   Pulse 76   Temp (!) 97.1 F (36.2 C)   Ht 5\' 6"  (1.676 m)   Wt 147 lb 9.6 oz (67 kg)   LMP 11/16/2007 (Approximate)    SpO2 98%   BMI 23.82 kg/m   SpO2: 98 %  GENERAL: Well-developed, well-nourished woman in no acute distress.  Fully ambulatory.  No conversational dyspnea. HEAD: Normocephalic, atraumatic.  EYES: Pupils equal, round, reactive to light.  No scleral icterus.  MOUTH: Dentition intact, oral mucosa moist.  No thrush. NECK: Supple. No thyromegaly. Trachea midline. No JVD.  No adenopathy. PULMONARY: Good air entry bilaterally.  Mild dry crackles on the right base, no wheezes or rhonchi. CARDIOVASCULAR: S1 and S2. Regular rate and rhythm.  No rubs, murmurs or gallops heard. ABDOMEN: Benign. MUSCULOSKELETAL: No joint deformity, no clubbing, no edema.  NEUROLOGIC: No focal deficit, no gait disturbance, speech is fluent. SKIN: Intact,warm,dry.  No rashes. PSYCH: Mood and behavior normal.       Assessment & Plan:     ICD-10-CM   1. Heterozygous alpha 1-antitrypsin deficiency (HCC)  E88.01 Alpha-1-antitrypsin    2. Stage 2 moderate COPD by GOLD classification (HCC)  J44.9     3. Personal history of tobacco use, presenting hazards to health  Z87.891      Discussion:    Chronic Obstructive Pulmonary Disease (COPD) Patient reports no dyspnea, wheezing, or cough. Pulmonary function tests are stable to improved. Continues Anoro with satisfactory symptom control. Alpha-1 antitrypsin levels are stable; supplementation not required currently. Discussed potential benefits of supplementation if levels decrease. - Continue Anoro - Monitor alpha-1 antitrypsin levels - Follow up in June/July for lab tests and in August MD visit after LDCT chest to review results  Alpha-1 Antitrypsin Deficiency Alpha-1 antitrypsin levels are stable. Supplementation will be initiated if levels decrease to prevent exacerbations. - Monitor alpha-1 antitrypsin levels - Consider supplementation if levels decrease  Lung Cancer Screening Scheduled for lung cancer screening CT in mid-August. Follow-up visit planned to  review results. - Schedule lung cancer screening CT in mid-August - Follow up towards the end of August to review results  General Health Maintenance Patient has received flu and COVID vaccinations. Smoking cessation has significantly improved health and reduced hospitalization risk. - Continue to avoid smoking - Maintain up-to-date vaccinations  Follow-up - Follow up in June/July for lab tests - Follow up towards the end of August to review lung cancer screening results.    Advised if symptoms do not improve or worsen, to please contact office for sooner follow up or seek emergency care.    I spent 30 minutes of dedicated to the care of this patient on the date of this encounter to include pre-visit review of records, face-to-face time with the patient discussing conditions above, post visit ordering of testing, clinical documentation with the electronic health record, making appropriate referrals as documented, and communicating necessary findings to members of the patients care team.   C. Chloe Counter, MD Advanced Bronchoscopy [facility]    *This note was  generated using voice recognition software/Dragon and/or AI transcription program.  Despite best efforts to proofread, errors can occur which can change the meaning. Any transcriptional errors that result from this process are unintentional and may not be fully corrected at the time of dictation.

## 2023-10-01 NOTE — Patient Instructions (Signed)
 VISIT SUMMARY:  During today's visit, we reviewed your current health status, focusing on your COPD and alpha-1 antitrypsin deficiency. You reported no recent issues with breathing, and your pulmonary function tests have shown improvement. We also discussed your proactive steps in health management, including quitting smoking and staying up-to-date with vaccinations. Additionally, we addressed your upcoming lung cancer screening and general health maintenance.  YOUR PLAN:  -CHRONIC OBSTRUCTIVE PULMONARY DISEASE (COPD): COPD is a chronic lung condition that makes it hard to breathe. You reported no issues with shortness of breath, wheezing, or cough, and your breathing tests are stable. Continue taking Anoro as prescribed. We will monitor your alpha-1 antitrypsin levels and follow up in June or July for lab tests and to review the results.  -ALPHA-1 ANTITRYPSIN DEFICIENCY: Alpha-1 antitrypsin deficiency is a genetic condition that can lead to lung disease. Your levels are currently stable, so no supplementation is needed at this time. We will continue to monitor your levels and consider supplementation if they decrease.  -LUNG CANCER SCREENING: Lung cancer screening is a preventive measure to detect lung cancer early. You are scheduled for a lung cancer screening CT in mid-August. We will follow up towards the end of August to review the results.  -GENERAL HEALTH MAINTENANCE: You have received your flu and COVID vaccinations, and quitting smoking has significantly improved your health. Continue to avoid smoking and keep your vaccinations up-to-date.  INSTRUCTIONS:  Please follow up in mid July for lab tests (alpha level) and towards the end of August to review your lung cancer screening results lab test.

## 2023-10-06 NOTE — Patient Instructions (Signed)
Be Involved in Caring For Your Health:  Taking Medications When medications are taken as directed, they can greatly improve your health. But if they are not taken as prescribed, they may not work. In some cases, not taking them correctly can be harmful. To help ensure your treatment remains effective and safe, understand your medications and how to take them. Bring your medications to each visit for review by your provider.  Your lab results, notes, and after visit summary will be available on My Chart. We strongly encourage you to use this feature. If lab results are abnormal the clinic will contact you with the appropriate steps. If the clinic does not contact you assume the results are satisfactory. You can always view your results on My Chart. If you have questions regarding your health or results, please contact the clinic during office hours. You can also ask questions on My Chart.  We at [facility] are grateful that you chose [place] to provide your care. We strive to provide evidence-based and compassionate care and are always looking for feedback. If you get a survey from the clinic please complete this so we can hear your opinions.  Heart-Healthy Eating Plan Many factors influence your heart health, including eating and exercise habits. Heart health is also called coronary health. Coronary risk increases with abnormal blood fat (lipid) levels. A heart-healthy eating plan includes limiting unhealthy fats, increasing healthy fats, limiting salt (sodium) intake, and making other diet and lifestyle changes. What is my plan? Your health care provider may recommend that: You limit your fat intake to _________% or less of your total calories each day. You limit your saturated fat intake to _________% or less of your total calories each day. You limit the amount of cholesterol in your diet to less than _________ mg per day. You limit the amount of sodium in your diet to less than _________  mg per day. What are tips for following this plan? Cooking Cook foods using methods other than frying. Baking, boiling, grilling, and broiling are all good options. Other ways to reduce fat include: Removing the skin from poultry. Removing all visible fats from meats. Steaming vegetables in water or broth. Meal planning  At meals, imagine dividing your plate into fourths: Fill one-half of your plate with vegetables and green salads. Fill one-fourth of your plate with whole grains. Fill one-fourth of your plate with lean protein foods. Eat 2-4 cups of vegetables per day. One cup of vegetables equals 1 cup (91 g) broccoli or cauliflower florets, 2 medium carrots, 1 large bell pepper, 1 large sweet potato, 1 large tomato, 1 medium white potato, 2 cups (150 g) raw leafy greens. Eat 1-2 cups of fruit per day. One cup of fruit equals 1 small apple, 1 large banana, 1 cup (237 g) mixed fruit, 1 large orange,  cup (82 g) dried fruit, 1 cup (240 mL) 100% fruit juice. Eat more foods that contain soluble fiber. Examples include apples, broccoli, carrots, beans, peas, and barley. Aim to get 25-30 g of fiber per day. Increase your consumption of legumes, nuts, and seeds to 4-5 servings per week. One serving of dried beans or legumes equals  cup (90 g) cooked, 1 serving of nuts is  oz (12 almonds, 24 pistachios, or 7 walnut halves), and 1 serving of seeds equals  oz (8 g). Fats Choose healthy fats more often. Choose monounsaturated and polyunsaturated fats, such as olive and canola oils, avocado oil, flaxseeds, walnuts, almonds, and seeds. Eat  more omega-3 fats. Choose salmon, mackerel, sardines, tuna, flaxseed oil, and ground flaxseeds. Aim to eat fish at least 2 times each week. Check food labels carefully to identify foods with trans fats or high amounts of saturated fat. Limit saturated fats. These are found in animal products, such as meats, butter, and cream. Plant sources of saturated fats  include palm oil, palm kernel oil, and coconut oil. Avoid foods with partially hydrogenated oils in them. These contain trans fats. Examples are stick margarine, some tub margarines, cookies, crackers, and other baked goods. Avoid fried foods. General information Eat more home-cooked food and less restaurant, buffet, and fast food. Limit or avoid alcohol. Limit foods that are high in added sugar and simple starches such as foods made using white refined flour (white breads, pastries, sweets). Lose weight if you are overweight. Losing just 5-10% of your body weight can help your overall health and prevent diseases such as diabetes and heart disease. Monitor your sodium intake, especially if you have high blood pressure. Talk with your health care provider about your sodium intake. Try to incorporate more vegetarian meals weekly. What foods should I eat? Fruits All fresh, canned (in natural juice), or frozen fruits. Vegetables Fresh or frozen vegetables (raw, steamed, roasted, or grilled). Green salads. Grains Most grains. Choose whole wheat and whole grains most of the time. Rice and pasta, including brown rice and pastas made with whole wheat. Meats and other proteins Lean, well-trimmed beef, veal, pork, and lamb. Chicken and [place] without skin. All fish and shellfish. Wild duck, rabbit, pheasant, and venison. Egg whites or low-cholesterol egg substitutes. Dried beans, peas, lentils, and tofu. Seeds and most nuts. Dairy Low-fat or nonfat cheeses, including ricotta and mozzarella. Skim or 1% milk (liquid, powdered, or evaporated). Buttermilk made with low-fat milk. Nonfat or low-fat yogurt. Fats and oils Non-hydrogenated (trans-free) margarines. Vegetable oils, including soybean, sesame, sunflower, olive, avocado, peanut, safflower, corn, canola, and cottonseed. Salad dressings or mayonnaise made with a vegetable oil. Beverages Water (mineral or sparkling). Coffee and tea. Unsweetened ice  tea. Diet beverages. Sweets and desserts Sherbet, gelatin, and fruit ice. Small amounts of dark chocolate. Limit all sweets and desserts. Seasonings and condiments All seasonings and condiments. The items listed above may not be a complete list of foods and beverages you can eat. Contact a dietitian for more options. What foods should I avoid? Fruits Canned fruit in heavy syrup. Fruit in cream or butter sauce. Fried fruit. Limit coconut. Vegetables Vegetables cooked in cheese, cream, or butter sauce. Fried vegetables. Grains Breads made with saturated or trans fats, oils, or whole milk. Croissants. Sweet rolls. Donuts. High-fat crackers, such as cheese crackers and chips. Meats and other proteins Fatty meats, such as hot dogs, ribs, sausage, bacon, rib-eye roast or steak. High-fat deli meats, such as salami and bologna. Caviar. Domestic duck and goose. Organ meats, such as liver. Dairy Cream, sour cream, cream cheese, and creamed cottage cheese. Whole-milk cheeses. Whole or 2% milk (liquid, evaporated, or condensed). Whole buttermilk. Cream sauce or high-fat cheese sauce. Whole-milk yogurt. Fats and oils Meat fat, or shortening. Cocoa butter, hydrogenated oils, palm oil, coconut oil, palm kernel oil. Solid fats and shortenings, including bacon fat, salt pork, lard, and butter. Nondairy cream substitutes. Salad dressings with cheese or sour cream. Beverages Regular sodas and any drinks with added sugar. Sweets and desserts Frosting. Pudding. Cookies. Cakes. Pies. Milk chocolate or white chocolate. Buttered syrups. Full-fat ice cream or ice cream drinks. The items listed above may  not be a complete list of foods and beverages to avoid. Contact a dietitian for more information. Summary Heart-healthy meal planning includes limiting unhealthy fats, increasing healthy fats, limiting salt (sodium) intake and making other diet and lifestyle changes. Lose weight if you are overweight. Losing just  5-10% of your body weight can help your overall health and prevent diseases such as diabetes and heart disease. Focus on eating a balance of foods, including fruits and vegetables, low-fat or nonfat dairy, lean protein, nuts and legumes, whole grains, and heart-healthy oils and fats. This information is not intended to replace advice given to you by your health care provider. Make sure you discuss any questions you have with your health care provider. Document Revised: 10/08/2021 Document Reviewed: 10/08/2021 Elsevier Patient Education  2024 [employer].

## 2023-10-10 ENCOUNTER — Ambulatory Visit (INDEPENDENT_AMBULATORY_CARE_PROVIDER_SITE_OTHER): Payer: No Typology Code available for payment source | Admitting: Nurse Practitioner

## 2023-10-10 ENCOUNTER — Encounter: Payer: Self-pay | Admitting: Nurse Practitioner

## 2023-10-10 ENCOUNTER — Other Ambulatory Visit (HOSPITAL_COMMUNITY)
Admission: RE | Admit: 2023-10-10 | Discharge: 2023-10-10 | Disposition: A | Discharge status: Home / Self Care | Payer: BLUE CROSS/BLUE SHIELD | Source: Ambulatory Visit | Attending: Nurse Practitioner | Admitting: Nurse Practitioner

## 2023-10-10 VITALS — BP 112/64 | HR 71 | Temp 97.5°F | Ht 66.5 in | Wt 147.4 lb

## 2023-10-10 DIAGNOSIS — E8801 Alpha-1-antitrypsin deficiency: Secondary | ICD-10-CM | POA: Diagnosis not present

## 2023-10-10 DIAGNOSIS — K219 Gastro-esophageal reflux disease without esophagitis: Secondary | ICD-10-CM

## 2023-10-10 DIAGNOSIS — Z124 Encounter for screening for malignant neoplasm of cervix: Secondary | ICD-10-CM | POA: Insufficient documentation

## 2023-10-10 DIAGNOSIS — Z23 Encounter for immunization: Secondary | ICD-10-CM | POA: Diagnosis not present

## 2023-10-10 DIAGNOSIS — I739 Peripheral vascular disease, unspecified: Secondary | ICD-10-CM

## 2023-10-10 DIAGNOSIS — I2510 Atherosclerotic heart disease of native coronary artery without angina pectoris: Secondary | ICD-10-CM

## 2023-10-10 DIAGNOSIS — Z87891 Personal history of nicotine dependence: Secondary | ICD-10-CM

## 2023-10-10 DIAGNOSIS — I10 Essential (primary) hypertension: Secondary | ICD-10-CM

## 2023-10-10 DIAGNOSIS — Z0000 Encounter for general adult medical examination without abnormal findings: Secondary | ICD-10-CM

## 2023-10-10 DIAGNOSIS — E063 Autoimmune thyroiditis: Secondary | ICD-10-CM

## 2023-10-10 DIAGNOSIS — E785 Hyperlipidemia, unspecified: Secondary | ICD-10-CM

## 2023-10-10 DIAGNOSIS — J449 Chronic obstructive pulmonary disease, unspecified: Secondary | ICD-10-CM | POA: Diagnosis not present

## 2023-10-10 LAB — MICROALBUMIN, URINE WAIVED
Creatinine, Urine Waived: 100 mg/dL (ref 10–300)
Microalb, Ur Waived: 10 mg/L (ref 0–19)
Microalb/Creat Ratio: <30 mg/g (ref <30)

## 2023-10-10 MED ORDER — FUROSEMIDE 20 MG PO TABS: 20.0000 mg | ORAL_TABLET | Freq: Every day | ORAL | 4 refills | Status: AC | PRN

## 2023-10-10 MED ORDER — TIZANIDINE HCL 4 MG PO TABS: 4.0000 mg | ORAL_TABLET | Freq: Three times a day (TID) | ORAL | 1 refills | Status: AC | PRN

## 2023-10-10 MED ORDER — METOPROLOL SUCCINATE ER 25 MG PO TB24: 12.5000 mg | ORAL_TABLET | Freq: Every day | ORAL | 4 refills | Status: AC

## 2023-10-10 MED ORDER — LOSARTAN POTASSIUM 25 MG PO TABS: 12.5000 mg | ORAL_TABLET | Freq: Every day | ORAL | 4 refills | Status: AC

## 2023-10-10 MED ORDER — PANTOPRAZOLE SODIUM 40 MG PO TBEC: 40.0000 mg | DELAYED_RELEASE_TABLET | Freq: Every day | ORAL | 4 refills | Status: AC

## 2023-10-10 MED ORDER — ATORVASTATIN CALCIUM 80 MG PO TABS: 80.0000 mg | ORAL_TABLET | Freq: Every day | ORAL | 4 refills | Status: AC

## 2023-10-10 MED ORDER — LEVOTHYROXINE SODIUM 75 MCG PO TABS: ORAL_TABLET | ORAL | 4 refills | Status: AC

## 2023-10-10 MED ORDER — TICAGRELOR 60 MG PO TABS: ORAL_TABLET | ORAL | 4 refills | Status: DC

## 2023-10-10 NOTE — Assessment & Plan Note (Signed)
Ongoing, followed by cardiology.  Continue current medication regimen and collaboration with cardiology.

## 2023-10-10 NOTE — Assessment & Plan Note (Signed)
Recommend continued cessation of smoking.

## 2023-10-10 NOTE — Assessment & Plan Note (Signed)
Chronic, ongoing.  Continue Protonix for GI protection.  Risks of PPI use were discussed with patient including bone loss, C. Diff diarrhea, pneumonia, infections, CKD, electrolyte abnormalities.  Verbalizes understanding and chooses to continue the medication.  Mag level today.

## 2023-10-10 NOTE — Assessment & Plan Note (Signed)
Chronic, stable.  BP well below goal. Will continue current medication regimen and adjust as needed.  Continue collaboration with cardiology due to her significant family and personal cardiac history.  Recommend she monitor BP at least a few mornings a week at home and document.  DASH diet at home. Labs today: CBC, TSH, CMP and lipid panel.  Return in 6 months.

## 2023-10-10 NOTE — Assessment & Plan Note (Signed)
Chronic, stable. Continue current collaboration with pulmonary and inhaler regimen as ordered by them.  Recommend continued cessation of smoking.

## 2023-10-10 NOTE — Assessment & Plan Note (Signed)
Chronic, stable with no recent CP.  Continue Ticagrelor, ASA, and Atorvastatin for prevention + collaboration with cardiology.  High risk family and patient history.

## 2023-10-10 NOTE — Assessment & Plan Note (Signed)
Chronic, followed by pulmonary -- will continue this collaboration.  Recent notes reviewed. Appreciate their input.

## 2023-10-10 NOTE — Progress Notes (Signed)
BP 112/64   Pulse 71   Temp (!) 97.5 F (36.4 C) (Oral)   Ht 5' 6.5" (1.689 m)   Wt 147 lb 6.4 oz (66.9 kg)   LMP 11/16/2007 (Approximate)   SpO2 98%   BMI 23.43 kg/m    Subjective:    Patient ID: April Raymond, female    DOB: 04/29/1961, 63 y.o.   MRN: [id number]  HPI: April Raymond is a 63 y.o. female presenting on 10/10/2023 for comprehensive medical examination. Current medical complaints include:none  She currently lives with: husband Menopausal Symptoms: no  HYPERTENSION WITH CAD & HLD Follows with cardiology on 01/31/23 for stress-induced cardiomyopathy and angina. To return annually. Echo on 10/05/20 noted EF 60-65%. Taking Atorvastatin 80 MG.  Protonix daily for GI protection. Continues on Ticagrelor, Metoprolol, Losartan, Lasix daily and PRN, and NTG.  Has not had to use NTG recently.  HISTORY: History of MI at age 81. Had parathyroid removed in 2018, history of hyperparathyroid.   Hypertension status: stable  Satisfied with current treatment? yes Duration of hypertension: chronic BP monitoring frequency:  occasionally BP range:  BP medication side effects:  no Medication compliance: good compliance Aspirin: ticagrelor, ASA Recurrent headaches: no Visual changes: no Palpitations: no Dyspnea: no Chest pain: no Lower extremity edema: no Dizzy/lightheaded: no  The ASCVD Risk score (Arnett DK, et al., 2019) failed to calculate for the following reasons:   Risk score cannot be calculated because patient has a medical history suggesting prior/existing ASCVD  HYPOTHYROIDISM Continues on Levothyroxine 75 MCG daily.  Thyroid control status: stable Satisfied with current treatment? yes Medication side effects: no Medication compliance: good compliance Etiology of hypothyroidism:  Recent dose adjustment:no Fatigue: no Cold intolerance: yes Heat intolerance: no Weight gain: no Weight loss: no Constipation: no Diarrhea/loose stools: no Palpitations: no Lower  extremity edema: no Anxiety/depressed mood: no   COPD Saw pulmonary 10/01/23.  History of smoking, quit at 55 -- smoked 1 PPD.  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  Continues on Anoro and has Albuterol to use as needed.  Never uses Albuterol. COPD status: stable Satisfied with current treatment?: yes Oxygen use: no Dyspnea frequency: no Cough frequency: no Rescue inhaler frequency:  no Limitation of activity: no Productive cough: none Last Spirometry: with pulmonary Pneumovax: Up to Date Influenza: Up to Date       10/10/2023    9:39 AM 03/17/2023    9:04 AM 09/13/2022   10:21 AM 03/13/2022   10:19 AM 01/24/2022   10:32 AM  Depression screen PHQ 2/9  Decreased Interest 0 0 0 0 0  Down, Depressed, Hopeless 0 0 0 0 0  PHQ - 2 Score 0 0 0 0 0  Altered sleeping 1 1 2  0   Tired, decreased energy 1 0 2 0   Change in appetite 0 0 0 0   Feeling bad or failure about yourself  0 0 0 0   Trouble concentrating 0 0 0 0   Moving slowly or fidgety/restless 0 0 0 0   Suicidal thoughts 0 0 0 0   PHQ-9 Score 2 1 4  0   Difficult doing work/chores Not difficult at all Not difficult at all Not difficult at all Not difficult at all        10/10/2023    9:39 AM 03/17/2023    9:04 AM 09/13/2022   10:21 AM 03/13/2022   10:20 AM  GAD 7 : Generalized Anxiety Score  Nervous, Anxious,  on Edge 0 0 0 0  Control/stop worrying 0 0 0 0  Worry too much - different things 0 0 0 0  Trouble relaxing 0 0 0 0  Restless 0 0 0 0  Easily annoyed or irritable 0 0 0 0  Afraid - awful might happen 0 0 0 0  Total GAD 7 Score 0 0 0 0  Anxiety Difficulty Not difficult at all Not difficult at all Not difficult at all Not difficult at all      05/06/2022   10:32 AM 07/22/2022    1:48 PM 09/13/2022   10:20 AM 03/17/2023    9:04 AM 10/10/2023    9:39 AM  Fall Risk  Falls in the past year?   0 0 0  Was there an injury with Fall?   0 0 0  Fall Risk Category Calculator   0 0 0  Fall Risk Category  (Retired)   Low    (RETIRED) Patient Fall Risk Level Low fall risk Low fall risk     Patient at Risk for Falls Due to   No Fall Risks No Fall Risks No Fall Risks  Fall risk Follow up   Falls evaluation completed Falls evaluation completed Falls evaluation completed     Functional Status Survey: Is the patient deaf or have difficulty hearing?: No Does the patient have difficulty seeing, even when wearing glasses/contacts?: No Does the patient have difficulty concentrating, remembering, or making decisions?: No Does the patient have difficulty walking or climbing stairs?: No Does the patient have difficulty dressing or bathing?: No Does the patient have difficulty doing errands alone such as visiting a doctor's office or shopping?: No   Past Medical History:  Past Medical History:  Diagnosis Date   Allergy    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 2017 s/p prior MI & PCI RCA ([place]); b. 05/2020 Abnl ETT: 1.5-25mm horizontal ST dep in inflat leads @ peak stress w/ freq PVCs/couplets/bigeminy. HTN response; b. 05/2020 NSTEMI/Cath: LM nl, LAD 57m, LCX 20, RCA 35ost, patent prev placed prox-dist stent. EF 25-35% w/ apical ballooning.   Cataract 2019   COPD (chronic obstructive pulmonary disease) (HCC)    Dental bridge present    Permanent lower   Hashimoto's disease    Hidradenitis suppurativa    Hydradenitis    Hyperparathyroidism (HCC)    Hypertension    Myocardial infarction (HCC) 2017   Pericardial effusion    a. 05/2020 Echo: sm to mod circumferential peric eff w/o tamponade; b. 07/2020 Mod peric effusion; c. 09/2020 Echo: EF 60-65%, no rwma, small tomod effusion w/o compromise.   Raynaud's syndrome    Stroke [facility])    Takotsubo cardiomyopathy    a. 05/2020 Echo: EF 35-40%, glob HK, though basal wall motion best preserved consistent w/ stress induced CM. Gr1 DD. Nl RV size/fxn. Sm to mod  circumferential pericardial effusion w/o tamponade.   Wears contact lenses    Wears hearing aid in left ear     Surgical History:  Past Surgical History:  Procedure Laterality Date   BREAST BIOPSY Right 05/04/2021   stereo x clip BENIGN BREAST TISSUE WITH MILD STROMAL FIBROSIS AND HEMORRHAGE   BREAST CYST ASPIRATION Right 05/01/2021   BREAST EXCISIONAL BIOPSY Left    age 50's   CARDIAC CATHETERIZATION     CATARACT EXTRACTION W/PHACO Left 04/22/2022   Procedure: CATARACT EXTRACTION PHACO  AND INTRAOCULAR LENS PLACEMENT (IOC) LEFT;  Surgeon: Nevada Crane, MD;  Location: [facility];  Service: Ophthalmology;  Laterality: Left;  1.42 00:19.9   CATARACT EXTRACTION W/PHACO Right 05/06/2022   Procedure: CATARACT EXTRACTION PHACO AND INTRAOCULAR LENS PLACEMENT (IOC) RIGHT 1.83 00:25.4;  Surgeon: Nevada Crane, MD;  Location: [facility];  Service: Ophthalmology;  Laterality: Right;   CHEST WALL RECONSTRUCTION Right 05/09/2021   Procedure: CHEST WALL MASS RESECTION;  Surgeon: Corliss Skains, MD;  Location: [facility] OR;  Service: Thoracic;  Laterality: Right;  posterior   CYST EXCISION  x2   EYE SURGERY  05/15/22   Cataract surgery   LEFT HEART CATH AND CORONARY ANGIOGRAPHY N/A 05/31/2020   Procedure: LEFT HEART CATH AND CORONARY ANGIOGRAPHY;  Surgeon: Yvonne Kendall, MD;  Location: [facility] INVASIVE CV LAB;  Service: Cardiovascular;  Laterality: N/A;   LIPOMA EXCISION     PARATHYROIDECTOMY     TONSILLECTOMY      Medications:  Current Outpatient Medications on File Prior to Visit  Medication Sig   acetaminophen (TYLENOL) 500 MG tablet Take 1,000 mg by mouth daily as needed.   ANORO ELLIPTA 62.5-25 MCG/ACT AEPB INHALE 1 PUFF BY MOUTH EVERY DAY   cetirizine (ZYRTEC) 10 MG tablet Take 10 mg by mouth daily.   conjugated estrogens (PREMARIN) vaginal cream PLACE 0.5 GRAMS TWICE WEEKLY INTRAVAGINALLY.   diphenhydrAMINE (BENADRYL) 25 MG tablet Take 25 mg by mouth at  bedtime.   fluticasone (FLONASE) 50 MCG/ACT nasal spray Place 2 sprays into both nostrils daily.   Multiple Vitamins-Minerals (CENTRUM SILVER 50+WOMEN PO) Take 1 tablet by mouth daily at 6 (six) AM.   nitroGLYCERIN (NITROSTAT) 0.4 MG SL tablet Place 1 tablet (0.4 mg total) under the tongue every 5 (five) minutes as needed for chest pain.   PROAIR RESPICLICK 108 (90 Base) MCG/ACT AEPB INHALE 2 PUFFS INTO THE LUNGS EVERY 6 (SIX) HOURS AS NEEDED (SOB/WHEEZING).   Current Facility-Administered Medications on File Prior to Visit  Medication   albuterol (PROVENTIL) (2.5 MG/3ML) 0.083% nebulizer solution 2.5 mg    Allergies:  Allergies  Allergen Reactions   Azithromycin Rash   Humira [Adalimumab] Rash   Tape Rash    Social History:  Social History   Socioeconomic History   Marital status: Married    Spouse name: Not on file   Number of children: Not on file   Years of education: Not on file   Highest education level: Some college, no degree  Occupational History   Not on file  Tobacco Use   Smoking status: Former    Current packs/day: 0.00    Average packs/day: 1 pack/day for 40.0 years (40.0 ttl pk-yrs)    Types: Cigarettes    Start date: 09/18/1975    Quit date: 09/18/2015    Years since quitting: 8.0   Smokeless tobacco: Never  Vaping Use   Vaping status: Never Used  Substance and Sexual Activity   Alcohol use: Yes    Alcohol/week: 3.0 standard drinks of alcohol    Types: 3 Glasses of wine per week    Comment: 2-3 glasses wine, 2-3x/wk   Drug use: Never   Sexual activity: Yes    Birth control/protection: Post-menopausal  Other Topics Concern   Not on file  Social History Narrative   Not on file   Social Drivers of Health   Financial Resource Strain: Low Risk  (10/09/2023)   Overall Financial Resource Strain (CARDIA)    Difficulty of Paying Living Expenses:  Not hard at all  Food Insecurity: No Food Insecurity (10/09/2023)   Hunger Vital Sign    Worried About Running  Out of Food in the Last Year: Never true    Ran Out of Food in the Last Year: Never true  Transportation Needs: No Transportation Needs (10/09/2023)   PRAPARE - Administrator, Civil Service (Medical): No    Lack of Transportation (Non-Medical): No  Physical Activity: Sufficiently Active (10/09/2023)   Exercise Vital Sign    Days of Exercise per Week: 7 days    Minutes of Exercise per Session: 60 min  Stress: No Stress Concern Present (10/09/2023)   [employer] of Occupational Health - Occupational Stress Questionnaire    Feeling of Stress : Only a little  Social Connections: Socially Integrated (10/09/2023)   Social Connection and Isolation Panel [NHANES]    Frequency of Communication with Friends and Family: Three times a week    Frequency of Social Gatherings with Friends and Family: Once a week    Attends Religious Services: More than 4 times per year    Active Member of [employer] or Organizations: Yes    Attends Engineer, structural: More than 4 times per year    Marital Status: Married  Catering manager Violence: Not At Risk (09/12/2021)   Humiliation, Afraid, Rape, and Kick questionnaire    Fear of Current or Ex-Partner: No    Emotionally Abused: No    Physically Abused: No    Sexually Abused: No   Social History   Tobacco Use  Smoking Status Former   Current packs/day: 0.00   Average packs/day: 1 pack/day for 40.0 years (40.0 ttl pk-yrs)   Types: Cigarettes   Start date: 09/18/1975   Quit date: 09/18/2015   Years since quitting: 8.0  Smokeless Tobacco Never   Social History   Substance and Sexual Activity  Alcohol Use Yes   Alcohol/week: 3.0 standard drinks of alcohol   Types: 3 Glasses of wine per week   Comment: 2-3 glasses wine, 2-3x/wk    Family History:  Family History  Problem Relation Age of Onset   Heart attack Mother 20   COPD Mother    Stroke Mother    Diverticulitis Mother    Cancer Mother        breast   Hypertension Mother     Breast cancer Mother 29   Hearing loss Mother    Heart disease Mother    Hypertension Father    Hearing loss Father    Stroke Maternal Grandfather    Alzheimer's disease Paternal Grandmother    Heart attack Paternal Grandfather    Breast cancer Cousin        mat cousin x 2   Hypertension Brother     Past medical history, surgical history, medications, allergies, family history and social history reviewed with patient today and changes made to appropriate areas of the chart.   ROS All other ROS negative except what is listed above and in the HPI.      Objective:    BP 112/64   Pulse 71   Temp (!) 97.5 F (36.4 C) (Oral)   Ht 5' 6.5" (1.689 m)   Wt 147 lb 6.4 oz (66.9 kg)   LMP 11/16/2007 (Approximate)   SpO2 98%   BMI 23.43 kg/m   Wt Readings from Last 3 Encounters:  10/10/23 147 lb 6.4 oz (66.9 kg)  10/01/23 147 lb 9.6 oz (67 kg)  05/06/23 146 lb 11.2  oz (66.5 kg)    Physical Exam Vitals and nursing note reviewed. Exam conducted with a chaperone present.  Constitutional:      General: She is awake. She is not in acute distress.    Appearance: She is well-developed and well-groomed. She is not ill-appearing or toxic-appearing.  HENT:     Head: Normocephalic and atraumatic.     Right Ear: Hearing, tympanic membrane, ear canal and external ear normal. No drainage.     Left Ear: Hearing, tympanic membrane, ear canal and external ear normal. No drainage.     Nose: Nose normal.     Right Sinus: No maxillary sinus tenderness or frontal sinus tenderness.     Left Sinus: No maxillary sinus tenderness or frontal sinus tenderness.     Mouth/Throat:     Mouth: Mucous membranes are moist.     Pharynx: Oropharynx is clear. Uvula midline. No pharyngeal swelling, oropharyngeal exudate or posterior oropharyngeal erythema.  Eyes:     General: Lids are normal.        Right eye: No discharge.        Left eye: No discharge.     Extraocular Movements: Extraocular movements intact.      Conjunctiva/sclera: Conjunctivae normal.     Pupils: Pupils are equal, round, and reactive to light.     Visual Fields: Right eye visual fields normal and left eye visual fields normal.  Neck:     Thyroid: No thyromegaly.     Vascular: No carotid bruit.     Trachea: Trachea normal.  Cardiovascular:     Rate and Rhythm: Normal rate and regular rhythm.     Heart sounds: Normal heart sounds. No murmur heard.    No gallop.  Pulmonary:     Effort: Pulmonary effort is normal. No accessory muscle usage or respiratory distress.     Breath sounds: Normal breath sounds.  Chest:  Breasts:    Right: Normal.     Left: Normal.  Abdominal:     General: Bowel sounds are normal.     Palpations: Abdomen is soft. There is no hepatomegaly or splenomegaly.     Tenderness: There is no abdominal tenderness.     Hernia: There is no hernia in the left inguinal area or right inguinal area.  Genitourinary:    Exam position: Lithotomy position.     Labia:        Right: No rash.        Left: No rash.      Urethra: No prolapse.     Vagina: Erythema (mild to vaginal walls) present.     Cervix: Normal.     Uterus: Normal.      Adnexa: Right adnexa normal and left adnexa normal.     Comments: Mild vaginal atrophy noted on exam.  Cervix anterior and pap obtained, sent to lab.   Musculoskeletal:        General: Normal range of motion.     Cervical back: Normal range of motion and neck supple.     Right lower leg: No edema.     Left lower leg: No edema.  Lymphadenopathy:     Head:     Right side of head: No submental, submandibular, tonsillar, preauricular or posterior auricular adenopathy.     Left side of head: No submental, submandibular, tonsillar, preauricular or posterior auricular adenopathy.     Cervical: No cervical adenopathy.     Upper Body:     Right upper body: No supraclavicular,  axillary or pectoral adenopathy.     Left upper body: No supraclavicular, axillary or pectoral adenopathy.   Skin:    General: Skin is warm and dry.     Capillary Refill: Capillary refill takes less than 2 seconds.     Findings: No rash.  Neurological:     Mental Status: She is alert and oriented to person, place, and time.     Gait: Gait is intact.     Deep Tendon Reflexes: Reflexes are normal and symmetric.     Reflex Scores:      Brachioradialis reflexes are 2+ on the right side and 2+ on the left side.      Patellar reflexes are 2+ on the right side and 2+ on the left side. Psychiatric:        Attention and Perception: Attention normal.        Mood and Affect: Mood normal.        Speech: Speech normal.        Behavior: Behavior normal. Behavior is cooperative.        Thought Content: Thought content normal.        Judgment: Judgment normal.    Results for orders placed or performed in visit on 10/10/23  Microalbumin, Urine Waived   Collection Time: 10/10/23 10:32 AM  Result Value Ref Range   Microalb, Ur Waived 10 0 - 19 mg/L   Creatinine, Urine Waived 100 10 - 300 mg/dL   Microalb/Creat Ratio <30 <30 mg/g      Assessment & Plan:   Problem List Items Addressed This Visit       Cardiovascular and Mediastinum   Coronary artery disease involving native coronary artery of native heart without angina pectoris   Chronic, stable with no recent CP.  Continue Ticagrelor, ASA, and Atorvastatin for prevention + collaboration with cardiology.  High risk family and patient history.      Relevant Medications   atorvastatin (LIPITOR) 80 MG tablet   furosemide (LASIX) 20 MG tablet   losartan (COZAAR) 25 MG tablet   metoprolol succinate (TOPROL-XL) 25 MG 24 hr tablet   Other Relevant Orders   Comprehensive metabolic panel   Lipid Panel w/o Chol/HDL Ratio   Essential hypertension   Chronic, stable.  BP well below goal. Will continue current medication regimen and adjust as needed.  Continue collaboration with cardiology due to her significant family and personal cardiac history.   Recommend she monitor BP at least a few mornings a week at home and document.  DASH diet at home. Labs today: CBC, TSH, CMP and lipid panel.  Return in 6 months.       Relevant Medications   atorvastatin (LIPITOR) 80 MG tablet   furosemide (LASIX) 20 MG tablet   losartan (COZAAR) 25 MG tablet   metoprolol succinate (TOPROL-XL) 25 MG 24 hr tablet   Other Relevant Orders   Microalbumin, Urine Waived (Completed)   CBC with Differential/Platelet   PAD (peripheral artery disease) (HCC)   Ongoing, followed by cardiology.  Continue current medication regimen and collaboration with cardiology.      Relevant Medications   atorvastatin (LIPITOR) 80 MG tablet   furosemide (LASIX) 20 MG tablet   losartan (COZAAR) 25 MG tablet   metoprolol succinate (TOPROL-XL) 25 MG 24 hr tablet     Respiratory   Heterozygous alpha 1-antitrypsin deficiency (HCC)   Chronic, followed by pulmonary -- will continue this collaboration.  Recent notes reviewed. Appreciate their input.  Stage 2 moderate COPD by GOLD classification (HCC) - Primary   Chronic, stable. Continue current collaboration with pulmonary and inhaler regimen as ordered by them.  Recommend continued cessation of smoking.        Digestive   GERD without esophagitis   Chronic, ongoing.  Continue Protonix for GI protection.  Risks of PPI use were discussed with patient including bone loss, C. Diff diarrhea, pneumonia, infections, CKD, electrolyte abnormalities.  Verbalizes understanding and chooses to continue the medication.  Mag level today.       Relevant Medications   pantoprazole (PROTONIX) 40 MG tablet   Other Relevant Orders   Magnesium     Endocrine   Hashimoto's disease   Chronic, ongoing.  Continue current medication regimen and adjust as needed.  Thyroid labs obtained today.      Relevant Medications   levothyroxine (SYNTHROID) 75 MCG tablet   metoprolol succinate (TOPROL-XL) 25 MG 24 hr tablet   Other Relevant Orders    TSH   T4, free     Other   Former smoker   Recommend continued cessation of smoking.      Hyperlipidemia LDL goal <70   Chronic, ongoing with history of MI.  Continue statin and adjust dose as needed. Could consider Rosuvastatin in future if any issues with Atorvastatin.  Lipid panel today.      Relevant Medications   atorvastatin (LIPITOR) 80 MG tablet   furosemide (LASIX) 20 MG tablet   losartan (COZAAR) 25 MG tablet   metoprolol succinate (TOPROL-XL) 25 MG 24 hr tablet   Other Relevant Orders   Comprehensive metabolic panel   Lipid Panel w/o Chol/HDL Ratio   Other Visit Diagnoses       Cervical cancer screening       Pap performed and sent to lab.   Relevant Orders   Cytology - PAP     Pneumococcal vaccination given       PCV20 today, educated patient -- higher risk.   Relevant Orders   Pneumococcal conjugate vaccine 20-valent     Encounter for annual physical exam       Annual physical today with labs and health maintenance reviewed, discussed with patient.        Follow up plan: Return in about 6 months (around 04/08/2024) for HTN/HLD, COPD, THYROID.  LABORATORY TESTING:  - Pap smear: Performed today  IMMUNIZATIONS:   - Tetanus vaccination status reviewed: last tetanus booster within 10 years. - Influenza: Up to date - Pneumovax: Not applicable - Prevnar: PCV20 today due to higher risk, next at 65 - COVID: Up to date - HPV: Not applicable - Shingrix vaccine: Up To Date  SCREENING: -Mammogram: Up to date last on 05/21/23 - Colonoscopy: Up to date  - Bone Density: Not applicable  -Hearing Test: Not applicable  -Spirometry: Not applicable   PATIENT COUNSELING:   Advised to take 1 mg of folate supplement per day if capable of pregnancy.   Sexuality: Discussed sexually transmitted diseases, partner selection, use of condoms, avoidance of unintended pregnancy  and contraceptive alternatives.   Advised to avoid cigarette smoking.  I discussed with the  patient that most people either abstain from alcohol or drink within safe limits (<=14/week and <=4 drinks/occasion for males, <=7/weeks and <= 3 drinks/occasion for females) and that the risk for alcohol disorders and other health effects rises proportionally with the number of drinks per week and how often a drinker exceeds daily limits.  Discussed cessation/primary prevention of drug use  and availability of treatment for abuse.   Diet: Encouraged to adjust caloric intake to maintain  or achieve ideal body weight, to reduce intake of dietary saturated fat and total fat, to limit sodium intake by avoiding high sodium foods and not adding table salt, and to maintain adequate dietary potassium and calcium preferably from fresh fruits, vegetables, and low-fat dairy products.    Stressed the importance of regular exercise  Injury prevention: Discussed safety belts, safety helmets, smoke detector, smoking near bedding or upholstery.   Dental health: Discussed importance of regular tooth brushing, flossing, and dental visits.    NEXT PREVENTATIVE PHYSICAL DUE IN 1 YEAR. Return in about 6 months (around 04/08/2024) for HTN/HLD, COPD, THYROID.

## 2023-10-10 NOTE — Assessment & Plan Note (Signed)
Chronic, ongoing.  Continue current medication regimen and adjust as needed. Thyroid labs obtained today.

## 2023-10-10 NOTE — Assessment & Plan Note (Signed)
Chronic, ongoing with history of MI.  Continue statin and adjust dose as needed. Could consider Rosuvastatin in future if any issues with Atorvastatin.  Lipid panel today.

## 2023-10-11 ENCOUNTER — Encounter: Payer: Self-pay | Admitting: Nurse Practitioner

## 2023-10-11 ENCOUNTER — Other Ambulatory Visit: Payer: Self-pay | Admitting: Nurse Practitioner

## 2023-10-11 DIAGNOSIS — E063 Autoimmune thyroiditis: Secondary | ICD-10-CM

## 2023-10-11 LAB — COMPREHENSIVE METABOLIC PANEL
ALT: 20 [IU]/L (ref 0–32)
AST: 26 [IU]/L (ref 0–40)
Albumin: 4.6 g/dL (ref 3.9–4.9)
Alkaline Phosphatase: 58 [IU]/L (ref 44–121)
BUN/Creatinine Ratio: 22 (ref 12–28)
BUN: 25 mg/dL (ref 8–27)
Bilirubin Total: 0.5 mg/dL (ref 0.0–1.2)
CO2: 28 mmol/L (ref 20–29)
Calcium: 10.1 mg/dL (ref 8.7–10.3)
Chloride: 99 mmol/L (ref 96–106)
Creatinine, Ser: 1.14 mg/dL — ABNORMAL HIGH (ref 0.57–1.00)
Globulin, Total: 2.5 g/dL (ref 1.5–4.5)
Glucose: 83 mg/dL (ref 70–99)
Potassium: 4.2 mmol/L (ref 3.5–5.2)
Sodium: 141 mmol/L (ref 134–144)
Total Protein: 7.1 g/dL (ref 6.0–8.5)
eGFR: 54 mL/min/{1.73_m2} — ABNORMAL LOW (ref >59)

## 2023-10-11 LAB — CBC WITH DIFFERENTIAL/PLATELET
Basophils Absolute: 0.1 10*3/uL (ref 0.0–0.2)
Basos: 2 %
EOS (ABSOLUTE): 0.1 10*3/uL (ref 0.0–0.4)
Eos: 2 %
Hematocrit: 37.5 % (ref 34.0–46.6)
Hemoglobin: 12.4 g/dL (ref 11.1–15.9)
Immature Grans (Abs): 0 10*3/uL (ref 0.0–0.1)
Immature Granulocytes: 0 %
Lymphocytes Absolute: 2.1 10*3/uL (ref 0.7–3.1)
Lymphs: 35 %
MCH: 33.2 pg — ABNORMAL HIGH (ref 26.6–33.0)
MCHC: 33.1 g/dL (ref 31.5–35.7)
MCV: 101 fL — ABNORMAL HIGH (ref 79–97)
Monocytes Absolute: 0.5 10*3/uL (ref 0.1–0.9)
Monocytes: 9 %
Neutrophils Absolute: 3.1 10*3/uL (ref 1.4–7.0)
Neutrophils: 52 %
Platelets: 452 10*3/uL — ABNORMAL HIGH (ref 150–450)
RBC: 3.73 x10E6/uL — ABNORMAL LOW (ref 3.77–5.28)
RDW: 13 % (ref 11.7–15.4)
WBC: 5.9 10*3/uL (ref 3.4–10.8)

## 2023-10-11 LAB — LIPID PANEL W/O CHOL/HDL RATIO
Cholesterol, Total: 156 mg/dL (ref 100–199)
HDL: 75 mg/dL (ref >39)
LDL Chol Calc (NIH): 65 mg/dL (ref 0–99)
Triglycerides: 88 mg/dL (ref 0–149)
VLDL Cholesterol Cal: 16 mg/dL (ref 5–40)

## 2023-10-11 LAB — TSH: TSH: 4.71 u[IU]/mL — ABNORMAL HIGH (ref 0.450–4.500)

## 2023-10-11 LAB — MAGNESIUM: Magnesium: 2.4 mg/dL — ABNORMAL HIGH (ref 1.6–2.3)

## 2023-10-11 LAB — T4, FREE: Free T4: 1.28 ng/dL (ref 0.82–1.77)

## 2023-10-11 NOTE — Progress Notes (Signed)
Contacted via MyChart -- needs lab only visit in 4 weeks please  Good morning April Raymond, your labs have returned: - CBC shows no anemia or infection.  There is mild elevation in platelet count, but this is very mild and we can monitor. - Kidney function, creatinine and eGFR, is showing mild decline again. Ensure you are getting good water intake daily and avoid Ibuprofen products.  Liver function, AST and ALT, is normal. - Magnesium mild elevation, if taking supplement reduce this. - TSH a little elevated, but Free T4 normal.  I would like to recheck this outpatient in 4 weeks to see if medication change is needed.  I will have staff call to schedule.  Ensure you are taking Levothyroxine every morning 30 minutes before food or other medications. - Lipid panel shows levels at goal.  Any questions?

## 2023-10-13 NOTE — Progress Notes (Signed)
Lab appt has been made.

## 2023-10-14 LAB — CYTOLOGY - PAP
Comment: NEGATIVE
Diagnosis: NEGATIVE
High risk HPV: NEGATIVE

## 2023-10-14 NOTE — Progress Notes (Signed)
Contacted via MyChart   I come bearing happy news April Raymond, your pap returned negative cytology and HPV!!  Woohoo!!  You will no longer need cervical cancer screening as next pap would be due after age 64 and they recommend no further pap screening at 74 and up.  If you ever have any abnormal bleeding or pelvic pain then we may check:) Have a great day.

## 2023-10-16 ENCOUNTER — Other Ambulatory Visit: Payer: Self-pay | Admitting: Nurse Practitioner

## 2023-10-17 NOTE — Telephone Encounter (Signed)
Requested Prescriptions  Pending Prescriptions Disp Refills   conjugated estrogens (PREMARIN) vaginal cream [Pharmacy Med Name: PREMARIN VAGINAL CREAM-APPL] 30 g 2    Sig: PLACE 0.5 GRAMS TWICE WEEKLY INTRAVAGINALLY.     OB/GYN:  Estrogens Passed - 10/17/2023  1:19 PM      Passed - Mammogram is up-to-date per Health Maintenance      Passed - Last BP in normal range    BP Readings from Last 1 Encounters:  10/10/23 112/64         Passed - Valid encounter within last 12 months    Recent Outpatient Visits           1 week ago Stage 2 moderate COPD by GOLD classification (HCC)   [facility] Hadar, Corrie Dandy T, NP   7 months ago Stage 2 moderate COPD by GOLD classification (HCC)   [facility] Mathis, Corrie Dandy T, NP   1 year ago Stage 2 moderate COPD by GOLD classification (HCC)   [facility] [place], Corrie Dandy T, NP   1 year ago Moderate COPD (chronic obstructive pulmonary disease) (HCC)   [facility] Bessie, Corrie Dandy T, NP   1 year ago Generalized rash   [facility] [place], Dorie Rank, NP       Future Appointments             In 3 months End, Cristal Deer, MD [facility] [place]   In 6 months Cannady, Dorie Rank, NP [facility]

## 2023-10-21 ENCOUNTER — Telehealth: Payer: No Typology Code available for payment source | Admitting: Physician Assistant

## 2023-10-21 DIAGNOSIS — J028 Acute pharyngitis due to other specified organisms: Secondary | ICD-10-CM

## 2023-10-21 DIAGNOSIS — B9689 Other specified bacterial agents as the cause of diseases classified elsewhere: Secondary | ICD-10-CM

## 2023-10-21 MED ORDER — CEFDINIR 300 MG PO CAPS: 300.0000 mg | ORAL_CAPSULE | Freq: Two times a day (BID) | ORAL | 0 refills | Status: AC

## 2023-10-21 NOTE — Progress Notes (Signed)
 Virtual Visit Consent   April Raymond, you are scheduled for a virtual visit with a [facility] provider today. Just as with appointments in the office, your consent must be obtained to participate. Your consent will be active for this visit and any virtual visit you may have with one of our providers in the next 365 days. If you have a MyChart account, a copy of this consent can be sent to you electronically.  As this is a virtual visit, video technology does not allow for your provider to perform a traditional examination. This may limit your provider's ability to fully assess your condition. If your provider identifies any concerns that need to be evaluated in person or the need to arrange testing (such as labs, EKG, etc.), we will make arrangements to do so. Although advances in technology are sophisticated, we cannot ensure that it will always work on either your end or our end. If the connection with a video visit is poor, the visit may have to be switched to a telephone visit. With either a video or telephone visit, we are not always able to ensure that we have a secure connection.  By engaging in this virtual visit, you consent to the provision of healthcare and authorize for your insurance to be billed (if applicable) for the services provided during this visit. Depending on your insurance coverage, you may receive a charge related to this service.  I need to obtain your verbal consent now. Are you willing to proceed with your visit today? April Raymond has provided verbal consent on 10/21/2023 for a virtual visit (video or telephone). [name], [place]  Date: 10/21/2023 10:51 AM  Virtual Visit via Video Note   I, [name], connected with  April Raymond  ([id number], 1961-01-30) on 10/21/23 at 10:45 AM EST by a video-enabled telemedicine application and verified that I am speaking with the correct person using two identifiers.  Location: Patient: Virtual Visit Location  Patient: Home Provider: Virtual Visit Location Provider: Home Office   I discussed the limitations of evaluation and management by telemedicine and the availability of in person appointments. The patient expressed understanding and agreed to proceed.    History of Present Illness: April Raymond is a 63 y.o. who identifies as a female who was assigned female at birth, and is being seen today for substantial sore throat over the past 4-5 days. Notes associated odynophagia and neck tenderness along with body aches. No fever. Notes a white patch at the back of the throat -- mainly on the L-side. Has been doing salt-water  gargles which help but only temporarily.   OTC --   HPI: HPI  Problems:  Patient Active Problem List   Diagnosis Date Noted   Heterozygous alpha 1-antitrypsin deficiency (HCC) 09/09/2022   GERD without esophagitis 09/09/2022   DDD (degenerative disc disease), cervical 11/19/2021   Chronic upper back pain (1ry area of Pain) (Right) 10/22/2021   H/O lipoma 05/09/2021   Stage 2 moderate COPD by GOLD classification (HCC) 01/12/2021   PAD (peripheral artery disease) (HCC) 09/22/2020   Stress-induced cardiomyopathy 05/31/2020   Former smoker 09/30/2019   History of MI (myocardial infarction) 09/30/2019   History of parathyroidectomy 09/30/2019   Coronary artery disease involving native coronary artery of native heart without angina pectoris 09/30/2019   Essential hypertension 09/30/2019   Hyperlipidemia LDL goal <70 09/30/2019   Hashimoto's disease 09/30/2019   Vaginal atrophy 09/30/2019   Hidradenitis suppurativa 09/30/2019  Allergies:  Allergies  Allergen Reactions   Azithromycin  Rash   Humira [Adalimumab] Rash   Tape Rash   Medications:  Current Outpatient Medications:    cefdinir  (OMNICEF ) 300 MG capsule, Take 1 capsule (300 mg total) by mouth 2 (two) times daily for 10 days., Disp: 20 capsule, Rfl: 0   acetaminophen  (TYLENOL ) 500 MG tablet, Take 1,000 mg by  mouth daily as needed., Disp: , Rfl:    ANORO ELLIPTA  62.5-25 MCG/ACT AEPB, INHALE 1 PUFF BY MOUTH EVERY DAY, Disp: 180 each, Rfl: 1   atorvastatin  (LIPITOR ) 80 MG tablet, Take 1 tablet (80 mg total) by mouth daily., Disp: 90 tablet, Rfl: 4   cetirizine (ZYRTEC) 10 MG tablet, Take 10 mg by mouth daily., Disp: , Rfl:    conjugated estrogens  (PREMARIN ) vaginal cream, PLACE 0.5 GRAMS TWICE WEEKLY INTRAVAGINALLY., Disp: 30 g, Rfl: 0   diphenhydrAMINE  (BENADRYL ) 25 MG tablet, Take 25 mg by mouth at bedtime., Disp: , Rfl:    fluticasone (FLONASE) 50 MCG/ACT nasal spray, Place 2 sprays into both nostrils daily., Disp: , Rfl:    furosemide  (LASIX ) 20 MG tablet, Take 1 tablet (20 mg total) by mouth daily as needed., Disp: 90 tablet, Rfl: 4   levothyroxine  (SYNTHROID ) 75 MCG tablet, TAKE 1 TABLET BY MOUTH EVERY DAY BEFORE BREAKFAST, Disp: 90 tablet, Rfl: 4   losartan  (COZAAR ) 25 MG tablet, Take 0.5 tablets (12.5 mg total) by mouth daily., Disp: 45 tablet, Rfl: 4   metoprolol  succinate (TOPROL -XL) 25 MG 24 hr tablet, Take 0.5 tablets (12.5 mg total) by mouth daily., Disp: 45 tablet, Rfl: 4   Multiple Vitamins-Minerals (CENTRUM SILVER 50+WOMEN PO), Take 1 tablet by mouth daily at 6 (six) AM., Disp: , Rfl:    nitroGLYCERIN  (NITROSTAT ) 0.4 MG SL tablet, Place 1 tablet (0.4 mg total) under the tongue every 5 (five) minutes as needed for chest pain., Disp: 25 tablet, Rfl: 3   pantoprazole  (PROTONIX ) 40 MG tablet, Take 1 tablet (40 mg total) by mouth daily., Disp: 90 tablet, Rfl: 4   PROAIR  RESPICLICK 108 (90 Base) MCG/ACT AEPB, INHALE 2 PUFFS INTO THE LUNGS EVERY 6 (SIX) HOURS AS NEEDED (SOB/WHEEZING)., Disp: 1 each, Rfl: 0   ticagrelor  (BRILINTA ) 60 MG TABS tablet, TAKE 1 TABLET (60 MG TOTAL) BY MOUTH 2 (TWO) TIMES DAILY., Disp: 180 tablet, Rfl: 4   tiZANidine  (ZANAFLEX ) 4 MG tablet, Take 1 tablet (4 mg total) by mouth every 8 (eight) hours as needed for muscle spasms., Disp: 270 tablet, Rfl: 1 No current  facility-administered medications for this visit.  Facility-Administered Medications Ordered in Other Visits:    albuterol  (PROVENTIL ) (2.5 MG/3ML) 0.083% nebulizer solution 2.5 mg, 2.5 mg, Nebulization, Once, Tamea Dedra CROME, MD  Observations/Objective: Patient is well-developed, well-nourished in no acute distress.  Resting comfortably at home.  Head is normocephalic, atraumatic.  No labored breathing. Speech is clear and coherent with logical content.  Patient is alert and oriented at baseline.  Bilateral oropharyngeal erythema noted. Tonsils surgically absent. There is are area of white exudate on the left posterior oropharynx near site of L tonsil. Uvula is midline and without swelling or lesion.  Assessment and Plan: 1. Bacterial pharyngitis (Primary) - cefdinir  (OMNICEF ) 300 MG capsule; Take 1 capsule (300 mg total) by mouth 2 (two) times daily for 10 days.  Dispense: 20 capsule; Refill: 0  Will start ABX as precaution. Will give Cefdinir  as easier to swallow than Amox and giving Azithromycin  allergy. Supportive measures and OTC medications reviewed. Follow-up in  person for any non resolving, new or worsening symptoms despite treatment.   Follow Up Instructions: I discussed the assessment and treatment plan with the patient. The patient was provided an opportunity to ask questions and all were answered. The patient agreed with the plan and demonstrated an understanding of the instructions.  A copy of instructions were sent to the patient via MyChart unless otherwise noted below.   The patient was advised to call back or seek an in-person evaluation if the symptoms worsen or if the condition fails to improve as anticipated.    [name], PA-C

## 2023-10-21 NOTE — Patient Instructions (Signed)
 April Raymond, thank you for joining April Velma Lunger, PA-C for today's virtual visit.  While this provider is not your primary care provider (PCP), if your PCP is located in our provider database this encounter information will be shared with them immediately following your visit.   A [facility] MyChart account gives you access to today's visit and all your visits, tests, and labs performed at [facility]  click here if you don't have a [facility] MyChart account or go to [email or web address]  Consent: (Patient) April Raymond provided verbal consent for this virtual visit at the beginning of the encounter.  Current Medications:  Current Outpatient Medications:    acetaminophen  (TYLENOL ) 500 MG tablet, Take 1,000 mg by mouth daily as needed., Disp: , Rfl:    ANORO ELLIPTA  62.5-25 MCG/ACT AEPB, INHALE 1 PUFF BY MOUTH EVERY DAY, Disp: 180 each, Rfl: 1   atorvastatin  (LIPITOR ) 80 MG tablet, Take 1 tablet (80 mg total) by mouth daily., Disp: 90 tablet, Rfl: 4   cetirizine (ZYRTEC) 10 MG tablet, Take 10 mg by mouth daily., Disp: , Rfl:    conjugated estrogens  (PREMARIN ) vaginal cream, PLACE 0.5 GRAMS TWICE WEEKLY INTRAVAGINALLY., Disp: 30 g, Rfl: 0   diphenhydrAMINE  (BENADRYL ) 25 MG tablet, Take 25 mg by mouth at bedtime., Disp: , Rfl:    fluticasone (FLONASE) 50 MCG/ACT nasal spray, Place 2 sprays into both nostrils daily., Disp: , Rfl:    furosemide  (LASIX ) 20 MG tablet, Take 1 tablet (20 mg total) by mouth daily as needed., Disp: 90 tablet, Rfl: 4   levothyroxine  (SYNTHROID ) 75 MCG tablet, TAKE 1 TABLET BY MOUTH EVERY DAY BEFORE BREAKFAST, Disp: 90 tablet, Rfl: 4   losartan  (COZAAR ) 25 MG tablet, Take 0.5 tablets (12.5 mg total) by mouth daily., Disp: 45 tablet, Rfl: 4   metoprolol  succinate (TOPROL -XL) 25 MG 24 hr tablet, Take 0.5 tablets (12.5 mg total) by mouth daily., Disp: 45 tablet, Rfl: 4   Multiple Vitamins-Minerals (CENTRUM SILVER 50+WOMEN PO), Take 1 tablet by  mouth daily at 6 (six) AM., Disp: , Rfl:    nitroGLYCERIN  (NITROSTAT ) 0.4 MG SL tablet, Place 1 tablet (0.4 mg total) under the tongue every 5 (five) minutes as needed for chest pain., Disp: 25 tablet, Rfl: 3   pantoprazole  (PROTONIX ) 40 MG tablet, Take 1 tablet (40 mg total) by mouth daily., Disp: 90 tablet, Rfl: 4   PROAIR  RESPICLICK 108 (90 Base) MCG/ACT AEPB, INHALE 2 PUFFS INTO THE LUNGS EVERY 6 (SIX) HOURS AS NEEDED (SOB/WHEEZING)., Disp: 1 each, Rfl: 0   ticagrelor  (BRILINTA ) 60 MG TABS tablet, TAKE 1 TABLET (60 MG TOTAL) BY MOUTH 2 (TWO) TIMES DAILY., Disp: 180 tablet, Rfl: 4   tiZANidine  (ZANAFLEX ) 4 MG tablet, Take 1 tablet (4 mg total) by mouth every 8 (eight) hours as needed for muscle spasms., Disp: 270 tablet, Rfl: 1 No current facility-administered medications for this visit.  Facility-Administered Medications Ordered in Other Visits:    albuterol  (PROVENTIL ) (2.5 MG/3ML) 0.083% nebulizer solution 2.5 mg, 2.5 mg, Nebulization, Once, April Dedra CROME, MD   Medications ordered in this encounter:  No orders of the defined types were placed in this encounter.    *If you need refills on other medications prior to your next appointment, please contact your pharmacy*  Follow-Up: Call back or seek an in-person evaluation if the symptoms worsen or if the condition fails to improve as anticipated.  [facility] [phone]  Other Instructions Pharyngitis  Pharyngitis is a sore  throat (pharynx). This is when there is redness, pain, and swelling in your throat. Most of the time, this condition gets better on its own. In some cases, you may need medicine. What are the causes? An infection from a virus. An infection from bacteria. Allergies. What increases the risk? Being 1-48 years old. Being in crowded environments. These include: Daycares. Schools. Dormitories. Living in a place with cold temperatures outside. Having a weakened disease-fighting (immune)  system. What are the signs or symptoms? Symptoms may vary depending on the cause. Common symptoms include: Sore throat. Tiredness (fatigue). Low-grade fever. Stuffy nose. Cough. Headache. Other symptoms may include: Glands in the neck (lymph nodes) that are swollen. Skin rashes. Film on the throat or tonsils. This can be caused by an infection from bacteria. Vomiting. Red, itchy eyes. Loss of appetite. Joint pain and muscle aches. Tonsils that are temporarily bigger than usual (enlarged). How is this treated? Many times, treatment is not needed. This condition usually gets better in 3-4 days without treatment. If the infection is caused by a bacteria, you may be need to take antibiotics. Follow these instructions at home: Medicines Take over-the-counter and prescription medicines only as told by your doctor. If you were prescribed an antibiotic medicine, take it as told by your doctor. Do not stop taking the antibiotic even if you start to feel better. Use throat lozenges or sprays to soothe your throat as told by your doctor. Children can get pharyngitis. Do not give your child aspirin . Managing pain To help with pain, try: Sipping warm liquids, such as: Broth. Herbal tea. Warm water . Eating or drinking cold or frozen liquids, such as frozen ice pops. Rinsing your mouth (gargle) with a salt water  mixture 3-4 times a day or as needed. To make salt water , dissolve -1 tsp (3-6 g) of salt in 1 cup (237 mL) of warm water . Do not swallow this mixture. Sucking on hard candy or throat lozenges. Putting a cool-mist humidifier in your bedroom at night to moisten the air. Sitting in the bathroom with the door closed for 5-10 minutes while you run hot water  in the shower.  General instructions  Do not smoke or use any products that contain nicotine or tobacco. If you need help quitting, ask your doctor. Rest as told by your doctor. Drink enough fluid to keep your pee (urine) pale  yellow. How is this prevented? Wash your hands often for at least 20 seconds with soap and water . If soap and water  are not available, use hand sanitizer. Do not touch your eyes, nose, or mouth with unwashed hands. Wash hands after touching these areas. Do not share cups or eating utensils. Avoid close contact with people who are sick. Contact a doctor if: You have large, tender lumps in your neck. You have a rash. You cough up green, yellow-brown, or bloody spit. Get help right away if: You have a stiff neck. You drool or cannot swallow liquids. You cannot drink or take medicines without vomiting. You have very bad pain that does not go away with medicine. You have problems breathing, and it is not from a stuffy nose. You have new pain and swelling in your knees, ankles, wrists, or elbows. These symptoms may be an emergency. Get help right away. Call your local emergency services (911 in the [place] Do not wait to see if the symptoms will go away. Do not drive yourself to the hospital. Summary Pharyngitis is a sore throat (pharynx). This is when there is redness,  pain, and swelling in your throat. Most of the time, pharyngitis gets better on its own. Sometimes, you may need medicine. If you were prescribed an antibiotic medicine, take it as told by your doctor. Do not stop taking the antibiotic even if you start to feel better. This information is not intended to replace advice given to you by your health care provider. Make sure you discuss any questions you have with your health care provider. Document Revised: 11/29/2020 Document Reviewed: 11/29/2020 Elsevier Patient Education  2024 Elsevier Inc.   If you have been instructed to have an in-person evaluation today at a local Urgent Care facility, please use the link below. It will take you to a list of all of our available [facility], including address, phone number and hours of operation. Please do not delay care.   [facility]  If you or a family member do not have a primary care provider, use the link below to schedule a visit and establish care. When you choose a [facility] primary care physician or advanced practice provider, you gain a long-term partner in health. Find a Primary Care Provider  Learn more about [facility]'s in-office and virtual care options: [facility] - Get Care Now

## 2023-10-25 ENCOUNTER — Encounter: Payer: Self-pay | Admitting: Nurse Practitioner

## 2023-10-27 ENCOUNTER — Encounter: Payer: Self-pay | Admitting: Nurse Practitioner

## 2023-11-10 ENCOUNTER — Other Ambulatory Visit: Payer: No Typology Code available for payment source

## 2023-11-10 DIAGNOSIS — E063 Autoimmune thyroiditis: Secondary | ICD-10-CM

## 2023-11-11 ENCOUNTER — Encounter: Payer: Self-pay | Admitting: Nurse Practitioner

## 2023-11-11 LAB — T4, FREE: Free T4: 1.57 ng/dL (ref 0.82–1.77)

## 2023-11-11 LAB — TSH: TSH: 1.76 u[IU]/mL (ref 0.450–4.500)

## 2023-11-11 NOTE — Progress Notes (Signed)
 Contacted via MyChart   Good morning April Raymond, thyroid labs have returned stable this check.  Continue current Levothyroxine dosing.  Any questions? Keep being amazing!!  Thank you for allowing me to participate in your care.  I appreciate you. Kindest regards, Koree Staheli

## 2023-12-14 ENCOUNTER — Other Ambulatory Visit: Payer: Self-pay | Admitting: Pulmonary Disease

## 2023-12-14 ENCOUNTER — Encounter: Payer: Self-pay | Admitting: Pulmonary Disease

## 2023-12-15 MED ORDER — ANORO ELLIPTA 62.5-25 MCG/ACT IN AEPB: 1.0000 | INHALATION_SPRAY | Freq: Every day | RESPIRATORY_TRACT | 3 refills | Status: AC

## 2024-01-07 ENCOUNTER — Other Ambulatory Visit: Payer: Self-pay | Admitting: Pulmonary Disease

## 2024-01-08 MED ORDER — PROAIR RESPICLICK 108 (90 BASE) MCG/ACT IN AEPB: INHALATION_SPRAY | RESPIRATORY_TRACT | 0 refills | Status: AC

## 2024-02-04 ENCOUNTER — Ambulatory Visit: Payer: PRIVATE HEALTH INSURANCE | Attending: Internal Medicine | Admitting: Internal Medicine

## 2024-02-04 ENCOUNTER — Encounter: Payer: Self-pay | Admitting: Internal Medicine

## 2024-02-04 VITALS — BP 128/76 | HR 71 | Ht 66.0 in | Wt 147.6 lb

## 2024-02-04 DIAGNOSIS — Z01810 Encounter for preprocedural cardiovascular examination: Secondary | ICD-10-CM

## 2024-02-04 DIAGNOSIS — I5032 Chronic diastolic (congestive) heart failure: Secondary | ICD-10-CM | POA: Diagnosis not present

## 2024-02-04 DIAGNOSIS — E785 Hyperlipidemia, unspecified: Secondary | ICD-10-CM

## 2024-02-04 DIAGNOSIS — I739 Peripheral vascular disease, unspecified: Secondary | ICD-10-CM

## 2024-02-04 DIAGNOSIS — I10 Essential (primary) hypertension: Secondary | ICD-10-CM | POA: Diagnosis not present

## 2024-02-04 DIAGNOSIS — I2510 Atherosclerotic heart disease of native coronary artery without angina pectoris: Secondary | ICD-10-CM | POA: Diagnosis not present

## 2024-02-04 NOTE — Progress Notes (Signed)
 Cardiology Office Note:  .   Date:  02/04/2024  ID:  April Raymond, DOB 03-May-1961, MRN [id number] PCP: April Pyles, NP  [facility] Providers Cardiologist:  April Crisp, MD     History of Present Illness: .   April Raymond is a 63 y.o. 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  I last saw her a year ago, at which time she was feeling well other than chronic exertional dyspnea controlled with as needed albuterol .  We did not make any medication changes or pursue additional testing.  Today, April Raymond reports that she has been feeling well without chest pain, shortness of breath, palpitations, or lightheadedness.  Mild lower extremity edema is unchanged, though she notes that she has been using her as needed furosemide  most days.  She checked her blood pressure at home yesterday and notes that it was 126/64.  She is concerned about needing surgery to her right thumb within the next 6 to 12 months.  She remains on ticagrelor  monotherapy without bleeding but is concerned about likely increase in cost when she turns 64 and goes on Medicare.  ROS: See HPI  Studies Reviewed: April Raymond   EKG Interpretation Date/Time:  Wednesday Feb 04 2024 10:03:45 EDT Ventricular Rate:  71 PR Interval:  156 QRS Duration:  82 QT Interval:  376 QTC Calculation: 408 R Axis:   66  Text Interpretation: Normal sinus rhythm Low voltage QRS Septal infarct (cited on or before 01-Jun-2020) Abnormal ECG When compared with ECG of 22-Nov-2022 No significant change was found Confirmed by April Raymond [phone])  on 02/04/2024 10:07:20 AM    TTE (12/13/2022): Normal LV size and wall thickness.  LVEF 60-65% with normal wall motion and diastolic function.  GLS -17.4%.  Normal RV size and function.  Normal PA pressure.  Normal biatrial size.  Small circumferential pericardial effusion noted.  Mild mitral and tricuspid regurgitation present.  Normal CVP.  Risk Assessment/Calculations:             Physical Exam:   VS:  BP 128/76 (BP Location: Left Arm, Cuff Size: Normal)   Pulse 71   Ht 5\' 6"  (1.676 m)   Wt 147 lb 9.6 oz (67 kg)   LMP 11/16/2007 (Approximate)   SpO2 99%   BMI 23.82 kg/m    Wt Readings from Last 3 Encounters:  02/04/24 147 lb 9.6 oz (67 kg)  10/10/23 147 lb 6.4 oz (66.9 kg)  10/01/23 147 lb 9.6 oz (67 kg)    General:  NAD. Neck: No JVD or HJR. Lungs: Clear to auscultation bilaterally without wheezes or crackles. Heart: Regular rate and rhythm without murmurs, rubs, or gallops. Abdomen: Soft, nontender, nondistended. Extremities: No lower extremity edema.  ASSESSMENT AND PLAN: .    Coronary artery disease without angina and hyperlipidemia: April Raymond is doing well without recurrent angina.  She has been maintained on ticagrelor  monotherapy after completing 12 months of DAPT following prior RCA stenting.  As cost will likely increase when she goes on Medicare next year, we have agreed to transition to aspirin  81 mg daily at that time.  Continue atorvastatin  80 mg daily and metoprolol  succinate 12.5 mg daily; LDL at goal on last  check in January.  Heart failure with improved ejection fraction: April Raymond noted to have stress-induced cardiomyopathy with severely reduced LVEF in 2021, which has resolved.  We will plan to continue current regimen of metoprolol  succinate and losartan .  She can continue using furosemide  as needed for management of her edema.  Hypertension: Initial blood pressure reading elevated, improved on recheck.  Continue losartan  and metoprolol .  PAD: Patient has  known bilateral iliac artery stenosis of these.  She does not report any claudication.  Continue antiplatelet and statin therapy as above.  Preoperative cardiovascular risk assessment: April Raymond anticipates needing thumb surgery within the next 6 to 12 months.  This procedure is a low risk from a cardiovascular standpoint.  Given the lack of angina and other unstable cardiac symptoms and ability to perform greater than 4 METS of activity, further preoperative testing is not necessary unless new symptoms develop in the meantime.  If ticagrelor  needs to be held for the procedure, it should be stopped 5 days before hand and resumed when safe to do so from a postoperative standpoint.  She will need to take aspirin  81 mg daily while ticagrelor  is being held.    Dispo: Return to clinic in 1 year  Signed, April Crisp, MD

## 2024-02-04 NOTE — Patient Instructions (Signed)

## 2024-02-05 ENCOUNTER — Encounter: Payer: Self-pay | Admitting: Internal Medicine

## 2024-02-05 DIAGNOSIS — I5032 Chronic diastolic (congestive) heart failure: Secondary | ICD-10-CM | POA: Insufficient documentation

## 2024-02-16 ENCOUNTER — Other Ambulatory Visit: Payer: Self-pay | Admitting: Nurse Practitioner

## 2024-02-17 NOTE — Telephone Encounter (Signed)
 Requested Prescriptions  Pending Prescriptions Disp Refills   PREMARIN  vaginal cream [Pharmacy Med Name: PREMARIN  VAGINAL CREAM-APPL] 30 g 0    Sig: PLACE 0.5 GRAMS TWICE WEEKLY INTRAVAGINALLY.     OB/GYN:  Estrogens  Failed - 02/17/2024 12:09 PM      Failed - Valid encounter within last 12 months    Recent Outpatient Visits   None     Future Appointments             In 2 months Cannady, Lavelle Posey, NP [facility]            Passed - Mammogram is up-to-date per Health Maintenance      Passed - Last BP in normal range    BP Readings from Last 1 Encounters:  02/04/24 128/76

## 2024-02-20 ENCOUNTER — Telehealth: Admitting: Family Medicine

## 2024-02-20 DIAGNOSIS — R21 Rash and other nonspecific skin eruption: Secondary | ICD-10-CM | POA: Diagnosis not present

## 2024-02-20 MED ORDER — BETAMETHASONE DIPROPIONATE 0.05 % EX CREA: TOPICAL_CREAM | Freq: Two times a day (BID) | CUTANEOUS | 0 refills | Status: AC

## 2024-02-20 MED ORDER — PREDNISONE 20 MG PO TABS
20.0000 mg | ORAL_TABLET | Freq: Two times a day (BID) | ORAL | 0 refills | Status: AC
Start: 2024-02-20 — End: 2024-02-25

## 2024-02-20 NOTE — Progress Notes (Signed)
 Virtual Visit Consent   EULOGIA Raymond, you are scheduled for a virtual visit with a [facility] provider today. Just as with appointments in the office, your consent must be obtained to participate. Your consent will be active for this visit and any virtual visit you may have with one of our providers in the next 365 days. If you have a MyChart account, a copy of this consent can be sent to you electronically.  As this is a virtual visit, video technology does not allow for your provider to perform a traditional examination. This may limit your provider's ability to fully assess your condition. If your provider identifies any concerns that need to be evaluated in person or the need to arrange testing (such as labs, EKG, etc.), we will make arrangements to do so. Although advances in technology are sophisticated, we cannot ensure that it will always work on either your end or our end. If the connection with a video visit is poor, the visit may have to be switched to a telephone visit. With either a video or telephone visit, we are not always able to ensure that we have a secure connection.  By engaging in this virtual visit, you consent to the provision of healthcare and authorize for your insurance to be billed (if applicable) for the services provided during this visit. Depending on your insurance coverage, you may receive a charge related to this service.  I need to obtain your verbal consent now. Are you willing to proceed with your visit today? [name] has provided verbal consent on 02/20/2024 for a virtual visit (video or telephone). Albertha Huger, FNP  Date: 02/20/2024 11:48 AM   Virtual Visit via Video Note   I, Albertha Huger, connected with  April Raymond  ([id number], 1961-04-12) on 02/20/24 at 12:00 PM EDT by a video-enabled telemedicine application and verified that I am speaking with the correct person using two identifiers.  Location: Patient: Virtual Visit Location Patient:  Home Provider: Virtual Visit Location Provider: Home Office   I discussed the limitations of evaluation and management by telemedicine and the availability of in person appointments. The patient expressed understanding and agreed to proceed.    History of Present Illness: April Raymond is a 63 y.o. who identifies as a female who was assigned female at birth, and is being seen today for rash for 5 weeks. Itching bumps on lower legs that form blisters then bust and when she thinks they are healing they fill up with fluid again. Triamcinolone  has not helped. They itch. .  HPI: HPI  Problems:  Patient Active Problem List   Diagnosis Date Noted   Heart failure with improved ejection fraction (HFimpEF) (HCC) 02/05/2024   Heterozygous alpha 1-antitrypsin deficiency (HCC) 09/09/2022   GERD without esophagitis 09/09/2022   DDD (degenerative disc disease), cervical 11/19/2021   Chronic upper back pain (1ry area of Pain) (Right) 10/22/2021   H/O lipoma 05/09/2021   Stage 2 moderate COPD by GOLD classification (HCC) 01/12/2021   PAD (peripheral artery disease) (HCC) 09/22/2020   Stress-induced cardiomyopathy 05/31/2020   Former smoker 09/30/2019   History of MI (myocardial infarction) 09/30/2019   History of parathyroidectomy 09/30/2019   Coronary artery disease involving native coronary artery of native heart without angina pectoris 09/30/2019   Essential hypertension 09/30/2019   Hyperlipidemia LDL goal <70 09/30/2019   Hashimoto's disease 09/30/2019   Vaginal atrophy 09/30/2019   Hidradenitis suppurativa 09/30/2019    Allergies:  Allergies  Allergen Reactions  Azithromycin  Rash   Humira [Adalimumab] Rash   Tape Rash   Medications:  Current Outpatient Medications:    betamethasone dipropionate 0.05 % cream, Apply topically 2 (two) times daily., Disp: 30 g, Rfl: 0   predniSONE  (DELTASONE ) 20 MG tablet, Take 1 tablet (20 mg total) by mouth 2 (two) times daily with a meal for 5 days.,  Disp: 10 tablet, Rfl: 0   acetaminophen  (TYLENOL ) 500 MG tablet, Take 1,000 mg by mouth daily as needed., Disp: , Rfl:    Albuterol  Sulfate (PROAIR  RESPICLICK) 108 (90 Base) MCG/ACT AEPB, INHALE 2 PUFFS INTO THE LUNGS EVERY 6 (SIX) HOURS AS NEEDED (SOB/WHEEZING)., Disp: 1 each, Rfl: 0   atorvastatin  (LIPITOR ) 80 MG tablet, Take 1 tablet (80 mg total) by mouth daily., Disp: 90 tablet, Rfl: 4   cetirizine (ZYRTEC) 10 MG tablet, Take 10 mg by mouth daily., Disp: , Rfl:    diphenhydrAMINE  (BENADRYL ) 25 MG tablet, Take 25 mg by mouth at bedtime., Disp: , Rfl:    fluticasone (FLONASE) 50 MCG/ACT nasal spray, Place 2 sprays into both nostrils daily., Disp: , Rfl:    furosemide  (LASIX ) 20 MG tablet, Take 1 tablet (20 mg total) by mouth daily as needed., Disp: 90 tablet, Rfl: 4   levothyroxine  (SYNTHROID ) 75 MCG tablet, TAKE 1 TABLET BY MOUTH EVERY DAY BEFORE BREAKFAST, Disp: 90 tablet, Rfl: 4   losartan  (COZAAR ) 25 MG tablet, Take 0.5 tablets (12.5 mg total) by mouth daily., Disp: 45 tablet, Rfl: 4   metoprolol  succinate (TOPROL -XL) 25 MG 24 hr tablet, Take 0.5 tablets (12.5 mg total) by mouth daily., Disp: 45 tablet, Rfl: 4   Multiple Vitamins-Minerals (CENTRUM SILVER 50+WOMEN PO), Take 1 tablet by mouth daily at 6 (six) AM., Disp: , Rfl:    nitroGLYCERIN  (NITROSTAT ) 0.4 MG SL tablet, Place 1 tablet (0.4 mg total) under the tongue every 5 (five) minutes as needed for chest pain., Disp: 25 tablet, Rfl: 3   pantoprazole  (PROTONIX ) 40 MG tablet, Take 1 tablet (40 mg total) by mouth daily., Disp: 90 tablet, Rfl: 4   PREMARIN  vaginal cream, PLACE 0.5 GRAMS TWICE WEEKLY INTRAVAGINALLY., Disp: 30 g, Rfl: 0   ticagrelor  (BRILINTA ) 60 MG TABS tablet, TAKE 1 TABLET (60 MG TOTAL) BY MOUTH 2 (TWO) TIMES DAILY., Disp: 180 tablet, Rfl: 4   tiZANidine  (ZANAFLEX ) 4 MG tablet, Take 1 tablet (4 mg total) by mouth every 8 (eight) hours as needed for muscle spasms., Disp: 270 tablet, Rfl: 1   umeclidinium-vilanterol (ANORO  ELLIPTA) 62.5-25 MCG/ACT AEPB, Inhale 1 puff into the lungs daily., Disp: 180 each, Rfl: 3 No current facility-administered medications for this visit.  Facility-Administered Medications Ordered in Other Visits:    albuterol  (PROVENTIL ) (2.5 MG/3ML) 0.083% nebulizer solution 2.5 mg, 2.5 mg, Nebulization, Once, Marc Senior, MD  Observations/Objective: Patient is well-developed, well-nourished in no acute distress.  Resting comfortably  at home.  Head is normocephalic, atraumatic.  No labored breathing.  Speech is clear and coherent with logical content.  Patient is alert and oriented at baseline.    Assessment and Plan: 1. Rash (Primary)  Stop triamcinolone . UC if sx worsen.   Follow Up Instructions: I discussed the assessment and treatment plan with the patient. The patient was provided an opportunity to ask questions and all were answered. The patient agreed with the plan and demonstrated an understanding of the instructions.  A copy of instructions were sent to the patient via MyChart unless otherwise noted below.     The patient  was advised to call back or seek an in-person evaluation if the symptoms worsen or if the condition fails to improve as anticipated.    Keshawn Sundberg, FNP

## 2024-02-20 NOTE — Patient Instructions (Signed)
 Rash, Adult A rash is a breakout of spots or blotches on the skin. It can affect the way the skin looks and feels. Many things can cause a rash. Common causes include: Viral infections. These include colds, measles, and hand, foot, and mouth disease. Bacterial infections. These include scarlet fever and impetigo. Fungal infections. These include athlete's foot, ringworm, and yeast rashes. Skin irritation. This may be from heat rash, exposure to moisture or friction for a long time (intertrigo), or exposure to soap or skin care products (eczema). Allergic reactions. These may be caused by foods, medicines, or things like poison ivy. Some rashes may go away after a few days. Others may last for a few weeks. The goal of treatment is to stop the itching and keep the rash from spreading. Follow these instructions at home: Medicine Take or apply over-the-counter and prescription medicines only as told by your health care provider. These may include: Corticosteroids. These can help treat red or swollen skin. They may be given as creams or as medicines to take by mouth (orally). Anti-itch lotions. Allergy medicines. Pain medicine. Antifungal medicine if the rash is from a fungal infection. Antibiotics if you have an infection.  Skin care Apply cool, wet cloths (compresses) to the affected areas. Do not scratch or rub your skin. Avoid covering the rash. Keep it exposed to air as often as you can. Managing itching and discomfort Avoid hot showers and baths. These can make itching worse. A cold shower may help. Try taking a bath with: Epsom salts. You can get these at your local pharmacy or grocery store. Follow the instructions on the package. Baking soda. Pour a small amount into the bath as told by your provider. Colloidal oatmeal. You can get this at your local pharmacy or grocery store. Follow the instructions on the package. Try putting baking soda paste on your skin. Stir water into baking  soda until it becomes like a paste. Try using calamine lotion or cortisone cream to help with itchiness. Keep cool. Stay out of the sun. Sweating and being hot can make itching worse. General instructions  Rest as needed. Drink enough fluid to keep your pee (urine) pale yellow. Wear loose-fitting clothes. Avoid scented soaps, detergents, and perfumes. Use gentle soaps, detergents, perfumes, and cosmetics. Avoid the things that cause your rash (triggers). Keep a journal to help keep track of your triggers. Write down: What you eat. What cosmetics you use. What you drink. What you wear. This includes jewelry. Contact a health care provider if: You sweat at night more than normal. You pee (urinate) more or less than normal, or your pee is a darker color than normal. Your eyes become sensitive to light. Your skin or the white parts of your eyes turn yellow (jaundice). Your skin tingles or is numb. You get painful blisters in your nose or mouth. Your rash does not go away after a few days, or it gets worse. You are more tired or thirsty than normal. You have new or worse symptoms. These may include: Pain in your abdomen. Fever. Diarrhea or vomiting. Weakness or weight loss. Get help right away if: You get confused. You have a severe headache, a stiff neck, or severe joint pain or stiffness. You become very sleepy or not responsive. You have a seizure. This information is not intended to replace advice given to you by your health care provider. Make sure you discuss any questions you have with your health care provider. Document Revised: 06/21/2022 Document Reviewed:  06/21/2022 Elsevier Patient Education  2024 [employer].

## 2024-04-12 ENCOUNTER — Encounter: Payer: Self-pay | Admitting: Acute Care

## 2024-04-16 ENCOUNTER — Ambulatory Visit: Payer: No Typology Code available for payment source | Admitting: Nurse Practitioner

## 2024-04-16 NOTE — Patient Instructions (Addendum)
 Repatha and Praluent  Try Magnesium from over the counter for leg cramping 400 MG.    Be Involved in Caring For Your Health:  Taking Medications When medications are taken as directed, they can greatly improve your health. But if they are not taken as prescribed, they may not work. In some cases, not taking them correctly can be harmful. To help ensure your treatment remains effective and safe, understand your medications and how to take them. Bring your medications to each visit for review by your provider.  Your lab results, notes, and after visit summary will be available on My Chart. We strongly encourage you to use this feature. If lab results are abnormal the clinic will contact you with the appropriate steps. If the clinic does not contact you assume the results are satisfactory. You can always view your results on My Chart. If you have questions regarding your health or results, please contact the clinic during office hours. You can also ask questions on My Chart.  We at [facility] are grateful that you chose us  to provide your care. We strive to provide evidence-based and compassionate care and are always looking for feedback. If you get a survey from the clinic please complete this so we can hear your opinions.  Heart-Healthy Eating Plan Many factors influence your heart health, including eating and exercise habits. Heart health is also called coronary health. Coronary risk increases with abnormal blood fat (lipid) levels. A heart-healthy eating plan includes limiting unhealthy fats, increasing healthy fats, limiting salt (sodium) intake, and making other diet and lifestyle changes. What is my plan? Your health care provider may recommend that: You limit your fat intake to _________% or less of your total calories each day. You limit your saturated fat intake to _________% or less of your total calories each day. You limit the amount of cholesterol in your diet to less than  _________ mg per day. You limit the amount of sodium in your diet to less than _________ mg per day. What are tips for following this plan? Cooking Cook foods using methods other than frying. Baking, boiling, grilling, and broiling are all good options. Other ways to reduce fat include: Removing the skin from poultry. Removing all visible fats from meats. Steaming vegetables in water  or broth. Meal planning  At meals, imagine dividing your plate into fourths: Fill one-half of your plate with vegetables and green salads. Fill one-fourth of your plate with whole grains. Fill one-fourth of your plate with lean protein foods. Eat 2-4 cups of vegetables per day. One cup of vegetables equals 1 cup (91 g) broccoli or cauliflower florets, 2 medium carrots, 1 large bell pepper, 1 large sweet potato, 1 large tomato, 1 medium white potato, 2 cups (150 g) raw leafy greens. Eat 1-2 cups of fruit per day. One cup of fruit equals 1 small apple, 1 large banana, 1 cup (237 g) mixed fruit, 1 large orange,  cup (82 g) dried fruit, 1 cup (240 mL) 100% fruit juice. Eat more foods that contain soluble fiber. Examples include apples, broccoli, carrots, beans, peas, and barley. Aim to get 25-30 g of fiber per day. Increase your consumption of legumes, nuts, and seeds to 4-5 servings per week. One serving of dried beans or legumes equals  cup (90 g) cooked, 1 serving of nuts is  oz (12 almonds, 24 pistachios, or 7 walnut halves), and 1 serving of seeds equals  oz (8 g). Fats Choose healthy fats more often. Choose  monounsaturated and polyunsaturated fats, such as olive and canola oils, avocado oil, flaxseeds, walnuts, almonds, and seeds. Eat more omega-3 fats. Choose salmon, mackerel, sardines, tuna, flaxseed oil, and ground flaxseeds. Aim to eat fish at least 2 times each week. Check food labels carefully to identify foods with trans fats or high amounts of saturated fat. Limit saturated fats. These are found in  animal products, such as meats, butter, and cream. Plant sources of saturated fats include palm oil, palm kernel oil, and coconut oil. Avoid foods with partially hydrogenated oils in them. These contain trans fats. Examples are stick margarine, some tub margarines, cookies, crackers, and other baked goods. Avoid fried foods. General information Eat more home-cooked food and less restaurant, buffet, and fast food. Limit or avoid alcohol. Limit foods that are high in added sugar and simple starches such as foods made using white refined flour (white breads, pastries, sweets). Lose weight if you are overweight. Losing just 5-10% of your body weight can help your overall health and prevent diseases such as diabetes and heart disease. Monitor your sodium intake, especially if you have high blood pressure. Talk with your health care provider about your sodium intake. Try to incorporate more vegetarian meals weekly. What foods should I eat? Fruits All fresh, canned (in natural juice), or frozen fruits. Vegetables Fresh or frozen vegetables (raw, steamed, roasted, or grilled). Green salads. Grains Most grains. Choose whole wheat and whole grains most of the time. Rice and pasta, including brown rice and pastas made with whole wheat. Meats and other proteins Lean, well-trimmed beef, veal, pork, and lamb. Chicken and [place] without skin. All fish and shellfish. Wild duck, rabbit, pheasant, and venison. Egg whites or low-cholesterol egg substitutes. Dried beans, peas, lentils, and tofu. Seeds and most nuts. Dairy Low-fat or nonfat cheeses, including ricotta and mozzarella. Skim or 1% milk (liquid, powdered, or evaporated). Buttermilk made with low-fat milk. Nonfat or low-fat yogurt. Fats and oils Non-hydrogenated (trans-free) margarines. Vegetable oils, including soybean, sesame, sunflower, olive, avocado, peanut, safflower, corn, canola, and cottonseed. Salad dressings or mayonnaise made with a vegetable  oil. Beverages Water  (mineral or sparkling). Coffee and tea. Unsweetened ice tea. Diet beverages. Sweets and desserts Sherbet, gelatin, and fruit ice. Small amounts of dark chocolate. Limit all sweets and desserts. Seasonings and condiments All seasonings and condiments. The items listed above may not be a complete list of foods and beverages you can eat. Contact a dietitian for more options. What foods should I avoid? Fruits Canned fruit in heavy syrup. Fruit in cream or butter sauce. Fried fruit. Limit coconut. Vegetables Vegetables cooked in cheese, cream, or butter sauce. Fried vegetables. Grains Breads made with saturated or trans fats, oils, or whole milk. Croissants. Sweet rolls. Donuts. High-fat crackers, such as cheese crackers and chips. Meats and other proteins Fatty meats, such as hot dogs, ribs, sausage, bacon, rib-eye roast or steak. High-fat deli meats, such as salami and bologna. Caviar. Domestic duck and goose. Organ meats, such as liver. Dairy Cream, sour cream, cream cheese, and creamed cottage cheese. Whole-milk cheeses. Whole or 2% milk (liquid, evaporated, or condensed). Whole buttermilk. Cream sauce or high-fat cheese sauce. Whole-milk yogurt. Fats and oils Meat fat, or shortening. Cocoa butter, hydrogenated oils, palm oil, coconut oil, palm kernel oil. Solid fats and shortenings, including bacon fat, salt pork, lard, and butter. Nondairy cream substitutes. Salad dressings with cheese or sour cream. Beverages Regular sodas and any drinks with added sugar. Sweets and desserts Frosting. Pudding. Cookies. Cakes. Pies. Milk  chocolate or white chocolate. Buttered syrups. Full-fat ice cream or ice cream drinks. The items listed above may not be a complete list of foods and beverages to avoid. Contact a dietitian for more information. Summary Heart-healthy meal planning includes limiting unhealthy fats, increasing healthy fats, limiting salt (sodium) intake and making  other diet and lifestyle changes. Lose weight if you are overweight. Losing just 5-10% of your body weight can help your overall health and prevent diseases such as diabetes and heart disease. Focus on eating a balance of foods, including fruits and vegetables, low-fat or nonfat dairy, lean protein, nuts and legumes, whole grains, and heart-healthy oils and fats. This information is not intended to replace advice given to you by your health care provider. Make sure you discuss any questions you have with your health care provider. Document Revised: 10/08/2021 Document Reviewed: 10/08/2021 Elsevier Patient Education  2024 [employer].

## 2024-04-19 ENCOUNTER — Other Ambulatory Visit: Payer: Self-pay | Admitting: Nurse Practitioner

## 2024-04-19 DIAGNOSIS — Z1231 Encounter for screening mammogram for malignant neoplasm of breast: Secondary | ICD-10-CM

## 2024-04-20 ENCOUNTER — Encounter: Payer: Self-pay | Admitting: Nurse Practitioner

## 2024-04-20 ENCOUNTER — Ambulatory Visit (INDEPENDENT_AMBULATORY_CARE_PROVIDER_SITE_OTHER): Payer: Self-pay | Admitting: Nurse Practitioner

## 2024-04-20 VITALS — BP 124/71 | HR 69 | Temp 98.1°F | Ht 66.0 in | Wt 149.8 lb

## 2024-04-20 DIAGNOSIS — E8801 Alpha-1-antitrypsin deficiency: Secondary | ICD-10-CM | POA: Diagnosis not present

## 2024-04-20 DIAGNOSIS — E785 Hyperlipidemia, unspecified: Secondary | ICD-10-CM

## 2024-04-20 DIAGNOSIS — E063 Autoimmune thyroiditis: Secondary | ICD-10-CM

## 2024-04-20 DIAGNOSIS — J449 Chronic obstructive pulmonary disease, unspecified: Secondary | ICD-10-CM | POA: Diagnosis not present

## 2024-04-20 DIAGNOSIS — I5032 Chronic diastolic (congestive) heart failure: Secondary | ICD-10-CM | POA: Diagnosis not present

## 2024-04-20 DIAGNOSIS — I2510 Atherosclerotic heart disease of native coronary artery without angina pectoris: Secondary | ICD-10-CM

## 2024-04-20 DIAGNOSIS — R252 Cramp and spasm: Secondary | ICD-10-CM

## 2024-04-20 DIAGNOSIS — I739 Peripheral vascular disease, unspecified: Secondary | ICD-10-CM

## 2024-04-20 DIAGNOSIS — I10 Essential (primary) hypertension: Secondary | ICD-10-CM

## 2024-04-20 MED ORDER — NITROGLYCERIN 0.4 MG SL SUBL: 0.4000 mg | SUBLINGUAL_TABLET | SUBLINGUAL | 3 refills | Status: AC | PRN

## 2024-04-20 NOTE — Assessment & Plan Note (Signed)
 Chronic, stable. Continue current collaboration with pulmonary and inhaler regimen as ordered by them.  Recommend continued cessation of smoking.

## 2024-04-20 NOTE — Assessment & Plan Note (Signed)
 Chronic, ongoing.  Continue current medication regimen and adjust as needed.  Thyroid labs obtained today due to recent episode of increased stools.

## 2024-04-20 NOTE — Progress Notes (Signed)
 BP 124/71   Pulse 69   Temp 98.1 F (36.7 C) (Oral)   Ht 5' 6 (1.676 m)   Wt 149 lb 12.8 oz (67.9 kg)   LMP 11/16/2007 (Approximate)   SpO2 98%   BMI 24.18 kg/m    Subjective:    Patient ID: April Raymond, female    DOB: Oct 31, 1960, 63 y.o.   MRN: [id number]  HPI: April Raymond is a 63 y.o. female  Chief Complaint  Patient presents with   COPD   Hyperlipidemia   Hypertension   Hypothyroidism   HYPERTENSION WITH CAD & HLD Taking Ticagrelor , Metoprolol , Atorvastatin , Losartan , Lasix  daily + NTG as needed. Has not had to use any NTG. Echo on 12/13/22 = 60-65%. Follows with cardiology and last saw 02/04/24.    Background: History of MI at age 57. Has a family history of MI, mother in her 80's and her father's dad passed in his 79's. Had parathyroid removed in 2018, history of hyperparathyroid.   Satisfied with current treatment? yes Duration of hypertension: chronic BP monitoring frequency: rarely BP range:  BP medication side effects: no Duration of hyperlipidemia: years Cholesterol medication side effects: no Cholesterol supplements: none Medication compliance: excellent compliance Aspirin : yes Recent stressors: no Recurrent headaches: no Visual changes: no Palpitations: no Dyspnea: no Chest pain: no Lower extremity edema: no Dizzy/lightheaded: no  The ASCVD Risk score (Arnett DK, et al., 2019) failed to calculate for the following reasons:   Risk score cannot be calculated because patient has a medical history suggesting prior/existing ASCVD   LEG CRAMPS Underlying PAD on chart.  Reports intermittent cramping episodes to both legs. Has been present on and off for one year. Duration: months Pain: yes Severity: 5/10  Quality:  cramping Location:  lower legs Bilateral:  yes Onset: gradual Frequency: intermittent Time of  day:  varies and lasts for hours sometimes Sudden unintentional leg jerking:   no Paresthesias:   no Decreased sensation:  no Weakness:    no Insomnia:   no Fatigue:   no Alleviating factors: nothing Aggravating factors: exercise Status: stable Treatments attempted: nothing  HYPOTHYROIDISM Takes Levothyroxine  75 MCG daily.   Thyroid control status:controlled Satisfied with current treatment? yes Medication side effects: no Medication compliance: good compliance Etiology of hypothyroidism:  Recent dose adjustment:no Fatigue: no Cold intolerance: no Heat intolerance: no Weight gain: no Weight loss: no Constipation: no Diarrhea/loose stools: had recent episode of frequent stools, now improved Palpitations: no Lower extremity edema: no Anxiety/depressed mood: no   COPD Saw pulmonary last on 10/01/23. History of smoking years ago. Continues Anoro and has Albuterol  to use as needed.    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 Trelegy tried, but the steroid element made her too hyperactive and could not sleep. COPD status: controlled Satisfied with current treatment?: yes Oxygen use: no Dyspnea frequency: none Cough frequency: sometimes Rescue inhaler frequency: none Limitation of activity: no Productive cough: none Last Spirometry: with pulmonary     04/20/2024   10:19 AM 10/10/2023    9:39 AM 03/17/2023    9:04 AM 09/13/2022   10:21 AM 03/13/2022   10:19 AM  Depression screen PHQ 2/9  Decreased Interest 0 0 0 0 0  Down, Depressed, Hopeless 0 0 0 0 0  PHQ - 2 Score 0 0 0 0 0  Altered sleeping 1 1 1 2  0  Tired, decreased energy 1 1 0 2 0  Change in appetite 0 0 0 0 0  Feeling bad or failure about yourself  0 0 0 0 0  Trouble concentrating 0 0 0 0 0  Moving slowly or fidgety/restless 0 0 0 0 0  Suicidal thoughts 0 0 0 0 0  PHQ-9 Score 2 2 1 4  0  Difficult doing work/chores Not difficult at all Not difficult at all Not difficult at all Not difficult at all Not difficult at all       04/20/2024   10:21 AM 10/10/2023    9:39 AM 03/17/2023    9:04 AM 09/13/2022   10:21 AM  GAD 7  : Generalized Anxiety Score  Nervous, Anxious, on Edge 0 0 0 0  Control/stop worrying 0 0 0 0  Worry too much - different things 0 0 0 0  Trouble relaxing 0 0 0 0  Restless 0 0 0 0  Easily annoyed or irritable 0 0 0 0  Afraid - awful might happen 0 0 0 0  Total GAD 7 Score 0 0 0 0  Anxiety Difficulty Not difficult at all Not difficult at all Not difficult at all Not difficult at all   Relevant past medical, surgical, family and social history reviewed and updated as indicated. Interim medical history since our last visit reviewed. Allergies and medications reviewed and updated.  Review of Systems  Constitutional:  Negative for activity change, appetite change, diaphoresis, fatigue and fever.  Respiratory:  Negative for cough, chest tightness, shortness of breath and wheezing.   Cardiovascular:  Negative for chest pain, palpitations and leg swelling.  Gastrointestinal: Negative.   Endocrine: Negative for cold intolerance and heat intolerance.  Neurological: Negative.   Psychiatric/Behavioral: Negative.     Per HPI unless specifically indicated above     Objective:    BP 124/71   Pulse 69   Temp 98.1 F (36.7 C) (Oral)   Ht 5' 6 (1.676 m)   Wt 149 lb 12.8 oz (67.9 kg)   LMP 11/16/2007 (Approximate)   SpO2 98%   BMI 24.18 kg/m   Wt Readings from Last 3 Encounters:  04/20/24 149 lb 12.8 oz (67.9 kg)  02/04/24 147 lb 9.6 oz (67 kg)  10/10/23 147 lb 6.4 oz (66.9 kg)    Physical Exam Vitals and nursing note reviewed.  Constitutional:      General: She is awake. She is not in acute distress.    Appearance: She is well-developed and well-groomed. She is not ill-appearing.  HENT:     Head: Normocephalic.     Right Ear: Hearing normal.     Left Ear: Hearing normal.  Eyes:     General: Lids are normal.        Right eye: No discharge.        Left eye: No discharge.     Conjunctiva/sclera: Conjunctivae normal.     Pupils: Pupils are equal, round, and reactive to light.   Neck:     Thyroid: No thyromegaly or thyroid tenderness.  Cardiovascular:     Rate and Rhythm: Normal rate and regular rhythm.     Pulses: Normal pulses.     Heart sounds: Normal heart sounds. No murmur heard.    No gallop.  Pulmonary:     Effort: Pulmonary effort is normal. No accessory muscle usage or respiratory distress.     Breath sounds: Normal breath sounds.  Abdominal:     General: Bowel sounds are normal.     Palpations: Abdomen is soft.  Musculoskeletal:     Cervical back: Normal  range of motion and neck supple.     Right lower leg: No edema.     Left lower leg: No edema.  Lymphadenopathy:     Cervical: No cervical adenopathy.  Skin:    General: Skin is warm and dry.  Neurological:     General: No focal deficit present.     Mental Status: She is alert and oriented to person, place, and time.     Deep Tendon Reflexes: Reflexes are normal and symmetric.  Psychiatric:        Attention and Perception: Attention normal.        Mood and Affect: Mood normal.        Speech: Speech normal.        Behavior: Behavior normal. Behavior is cooperative.        Thought Content: Thought content normal.    Results for orders placed or performed in visit on 11/10/23  TSH   Collection Time: 11/10/23  9:58 AM  Result Value Ref Range   TSH 1.760 0.450 - 4.500 uIU/mL  T4, free   Collection Time: 11/10/23  9:58 AM  Result Value Ref Range   Free T4 1.57 0.82 - 1.77 ng/dL      Assessment & Plan:   Problem List Items Addressed This Visit       Cardiovascular and Mediastinum   Heart failure with improved ejection fraction (HFimpEF) (HCC) - Primary   Chronic and stable. Euvolemic.  Continue collaboration with cardiology and current medication regimen.  Recommend: - Reminded to call for an overnight weight gain of >2 pounds or a weekly weight gain of >5 pounds - not adding salt to food and read food labels. Reviewed the importance of keeping daily sodium intake to 2000mg  daily.   - Avoid Ibuprofen products      Relevant Medications   nitroGLYCERIN  (NITROSTAT ) 0.4 MG SL tablet   Other Relevant Orders   Comprehensive metabolic panel with GFR   Essential hypertension   Chronic, stable.  BP well below goal. Will continue current medication regimen and adjust as needed.  Continue collaboration with cardiology due to her significant family and personal cardiac history.  Recommend she monitor BP at least a few mornings a week at home and document.  DASH diet at home. Labs today: CMP and lipid panel.  Return in 6 months.       Relevant Medications   nitroGLYCERIN  (NITROSTAT ) 0.4 MG SL tablet   Coronary artery disease involving native coronary artery of native heart without angina pectoris   Chronic, stable with no recent CP.  Continue Ticagrelor , ASA, and Atorvastatin  for prevention + collaboration with cardiology.  High risk family and patient history.      Relevant Medications   nitroGLYCERIN  (NITROSTAT ) 0.4 MG SL tablet     Respiratory   Stage 2 moderate COPD by GOLD classification (HCC)   Chronic, stable. Continue current collaboration with pulmonary and inhaler regimen as ordered by them.  Recommend continued cessation of smoking.      Heterozygous alpha 1-antitrypsin deficiency (HCC)   Chronic, followed by pulmonary -- will continue this collaboration.  Recent notes reviewed. Appreciate their input.        Endocrine   Hashimoto's disease   Chronic, ongoing.  Continue current medication regimen and adjust as needed.  Thyroid labs obtained today due to recent episode of increased stools.      Relevant Orders   T4, free   TSH     Other   Leg cramping  On and off for one year, has diagnosis of PAD on chart.  Offered imaging to assess blood flow, she wishes to hold off on this.  Notices more after exercise.  Will check electrolytes, CKD, magnesium, and thyroid labs today.  Possibly related to taking Atorvastatin  and Ticagrelor  together, but has been on  these for years and statins due have possible interaction risk with Ticagrelor .  Discussed with her we could trial changing to Repatha or Praluent, which she will think about. If ongoing or worsening highly advise she obtain imaging. At this time she will trial Magnesium supplement after working out.      Relevant Orders   Magnesium   CK (Creatine Kinase)   Hyperlipidemia LDL goal <70   Chronic, ongoing with history of MI. Continue statin and adjust dose as needed. Could consider Rosuvastatin in future if any issues with Atorvastatin .  Lipid panel today.      Relevant Medications   nitroGLYCERIN  (NITROSTAT ) 0.4 MG SL tablet   Other Relevant Orders   Comprehensive metabolic panel with GFR   Lipid Panel w/o Chol/HDL Ratio     Follow up plan: Return in about 6 months (around 10/21/2024) for Annual Physical after 10/09/24.

## 2024-04-20 NOTE — Assessment & Plan Note (Signed)
 Chronic, stable with no recent CP.  Continue Ticagrelor, ASA, and Atorvastatin for prevention + collaboration with cardiology.  High risk family and patient history.

## 2024-04-20 NOTE — Assessment & Plan Note (Signed)
 Chronic, ongoing with history of MI.  Continue statin and adjust dose as needed. Could consider Rosuvastatin in future if any issues with Atorvastatin.  Lipid panel today.

## 2024-04-20 NOTE — Assessment & Plan Note (Signed)
 Chronic, followed by pulmonary -- will continue this collaboration.  Recent notes reviewed. Appreciate their input.

## 2024-04-20 NOTE — Assessment & Plan Note (Signed)
 Chronic and stable. Euvolemic.  Continue collaboration with cardiology and current medication regimen.  Recommend: - Reminded to call for an overnight weight gain of >2 pounds or a weekly weight gain of >5 pounds - not adding salt to food and read food labels. Reviewed the importance of keeping daily sodium intake to 2000mg  daily.  - Avoid Ibuprofen products

## 2024-04-20 NOTE — Assessment & Plan Note (Addendum)
 On and off for one year, has diagnosis of PAD on chart.  Offered imaging to assess blood flow, she wishes to hold off on this.  Notices more after exercise.  Will check electrolytes, CKD, magnesium, and thyroid labs today.  Possibly related to taking Atorvastatin  and Ticagrelor  together, but has been on these for years and statins due have possible interaction risk with Ticagrelor .  Discussed with her we could trial changing to Repatha or Praluent, which she will think about. If ongoing or worsening highly advise she obtain imaging. At this time she will trial Magnesium supplement after working out.

## 2024-04-20 NOTE — Assessment & Plan Note (Signed)
Chronic, stable.  BP well below goal. Will continue current medication regimen and adjust as needed.  Continue collaboration with cardiology due to her significant family and personal cardiac history.  Recommend she monitor BP at least a few mornings a week at home and document.  DASH diet at home. Labs today: CMP and lipid panel.  Return in 6 months.

## 2024-04-21 ENCOUNTER — Ambulatory Visit: Payer: Self-pay | Admitting: Nurse Practitioner

## 2024-04-21 LAB — COMPREHENSIVE METABOLIC PANEL WITH GFR
ALT: 19 IU/L (ref 0–32)
AST: 27 IU/L (ref 0–40)
Albumin: 4.6 g/dL (ref 3.9–4.9)
Alkaline Phosphatase: 57 IU/L (ref 44–121)
BUN/Creatinine Ratio: 17 (ref 12–28)
BUN: 17 mg/dL (ref 8–27)
Bilirubin Total: 0.8 mg/dL (ref 0.0–1.2)
CO2: 23 mmol/L (ref 20–29)
Calcium: 9.8 mg/dL (ref 8.7–10.3)
Chloride: 101 mmol/L (ref 96–106)
Creatinine, Ser: 1.01 mg/dL — ABNORMAL HIGH (ref 0.57–1.00)
Globulin, Total: 2.2 g/dL (ref 1.5–4.5)
Glucose: 85 mg/dL (ref 70–99)
Potassium: 4.1 mmol/L (ref 3.5–5.2)
Sodium: 140 mmol/L (ref 134–144)
Total Protein: 6.8 g/dL (ref 6.0–8.5)
eGFR: 62 mL/min/1.73 (ref >59)

## 2024-04-21 LAB — LIPID PANEL W/O CHOL/HDL RATIO
Cholesterol, Total: 147 mg/dL (ref 100–199)
HDL: 67 mg/dL (ref >39)
LDL Chol Calc (NIH): 59 mg/dL (ref 0–99)
Triglycerides: 121 mg/dL (ref 0–149)
VLDL Cholesterol Cal: 21 mg/dL (ref 5–40)

## 2024-04-21 LAB — CK: Total CK: 80 U/L (ref 32–182)

## 2024-04-21 LAB — MAGNESIUM: Magnesium: 2.3 mg/dL (ref 1.6–2.3)

## 2024-04-21 LAB — TSH: TSH: 1.78 u[IU]/mL (ref 0.450–4.500)

## 2024-04-21 LAB — T4, FREE: Free T4: 1.38 ng/dL (ref 0.82–1.77)

## 2024-04-21 NOTE — Progress Notes (Signed)
 Contacted via MyChart  Good morning April Raymond, your labs have returned and overall are stable, including thyroid.  If ongoing leg cramps I would recommend imaging OR trying a change to Rosuvastatin which is known to not cause discomfort as often.  Any questions? Keep me in the loop on the cramping. Keep being amazing!!  Thank you for allowing me to participate in your care.  I appreciate you. Kindest regards, Rosan Calbert

## 2024-04-24 ENCOUNTER — Telehealth: Admitting: Nurse Practitioner

## 2024-04-24 DIAGNOSIS — U071 COVID-19: Secondary | ICD-10-CM | POA: Diagnosis not present

## 2024-04-24 MED ORDER — MOLNUPIRAVIR EUA 200MG CAPSULE: 4.0000 | ORAL_CAPSULE | Freq: Two times a day (BID) | ORAL | 0 refills | Status: AC

## 2024-04-24 NOTE — Patient Instructions (Signed)
 April Raymond, thank you for joining Haze LELON Servant, NP for today's virtual visit.  While this provider is not your primary care provider (PCP), if your PCP is located in our provider database this encounter information will be shared with them immediately following your visit.   A [facility] MyChart account gives you access to today's visit and all your visits, tests, and labs performed at [facility]  click here if you don't have a [facility] MyChart account or go to [email or web address]  Consent: (Patient) April Raymond provided verbal consent for this virtual visit at the beginning of the encounter.  Current Medications:  Current Outpatient Medications:    molnupiravir  EUA (LAGEVRIO ) 200 mg CAPS capsule, Take 4 capsules (800 mg total) by mouth 2 (two) times daily for 5 days., Disp: 40 capsule, Rfl: 0   acetaminophen  (TYLENOL ) 500 MG tablet, Take 1,000 mg by mouth daily as needed., Disp: , Rfl:    Albuterol  Sulfate (PROAIR  RESPICLICK) 108 (90 Base) MCG/ACT AEPB, INHALE 2 PUFFS INTO THE LUNGS EVERY 6 (SIX) HOURS AS NEEDED (SOB/WHEEZING)., Disp: 1 each, Rfl: 0   atorvastatin  (LIPITOR ) 80 MG tablet, Take 1 tablet (80 mg total) by mouth daily., Disp: 90 tablet, Rfl: 4   betamethasone  dipropionate 0.05 % cream, Apply topically 2 (two) times daily., Disp: 30 g, Rfl: 0   cetirizine (ZYRTEC) 10 MG tablet, Take 10 mg by mouth daily., Disp: , Rfl:    diphenhydrAMINE  (BENADRYL ) 25 MG tablet, Take 25 mg by mouth at bedtime., Disp: , Rfl:    fluticasone (FLONASE) 50 MCG/ACT nasal spray, Place 2 sprays into both nostrils daily., Disp: , Rfl:    furosemide  (LASIX ) 20 MG tablet, Take 1 tablet (20 mg total) by mouth daily as needed., Disp: 90 tablet, Rfl: 4   levothyroxine  (SYNTHROID ) 75 MCG tablet, TAKE 1 TABLET BY MOUTH EVERY DAY BEFORE BREAKFAST, Disp: 90 tablet, Rfl: 4   losartan  (COZAAR ) 25 MG tablet, Take 0.5 tablets (12.5 mg total) by mouth daily., Disp: 45 tablet, Rfl: 4    metoprolol  succinate (TOPROL -XL) 25 MG 24 hr tablet, Take 0.5 tablets (12.5 mg total) by mouth daily., Disp: 45 tablet, Rfl: 4   Multiple Vitamins-Minerals (CENTRUM SILVER 50+WOMEN PO), Take 1 tablet by mouth daily at 6 (six) AM., Disp: , Rfl:    nitroGLYCERIN  (NITROSTAT ) 0.4 MG SL tablet, Place 1 tablet (0.4 mg total) under the tongue every 5 (five) minutes as needed for chest pain., Disp: 25 tablet, Rfl: 3   pantoprazole  (PROTONIX ) 40 MG tablet, Take 1 tablet (40 mg total) by mouth daily., Disp: 90 tablet, Rfl: 4   PREMARIN  vaginal cream, PLACE 0.5 GRAMS TWICE WEEKLY INTRAVAGINALLY., Disp: 30 g, Rfl: 0   ticagrelor  (BRILINTA ) 60 MG TABS tablet, TAKE 1 TABLET (60 MG TOTAL) BY MOUTH 2 (TWO) TIMES DAILY., Disp: 180 tablet, Rfl: 4   tiZANidine  (ZANAFLEX ) 4 MG tablet, Take 1 tablet (4 mg total) by mouth every 8 (eight) hours as needed for muscle spasms., Disp: 270 tablet, Rfl: 1   umeclidinium-vilanterol (ANORO ELLIPTA ) 62.5-25 MCG/ACT AEPB, Inhale 1 puff into the lungs daily., Disp: 180 each, Rfl: 3 No current facility-administered medications for this visit.  Facility-Administered Medications Ordered in Other Visits:    albuterol  (PROVENTIL ) (2.5 MG/3ML) 0.083% nebulizer solution 2.5 mg, 2.5 mg, Nebulization, Once, Tamea Dedra CROME, MD   Medications ordered in this encounter:  Meds ordered this encounter  Medications   molnupiravir  EUA (LAGEVRIO ) 200 mg CAPS capsule    Sig: Take  4 capsules (800 mg total) by mouth 2 (two) times daily for 5 days.    Dispense:  40 capsule    Refill:  0    Supervising Provider:   BLAISE ALEENE KIDD [[id number]]     *If you need refills on other medications prior to your next appointment, please contact your pharmacy*  Follow-Up: Call back or seek an in-person evaluation if the symptoms worsen or if the condition fails to improve as anticipated.  [facility] [phone]  Other Instructions Recommend taking Coricidin HBP for cough and  congestion.  Not a candidate for Paxlovid due to taking Brilinta    Please keep well-hydrated and get plenty of rest. Start a saline nasal rinse to flush out your nasal passages. You can use plain Mucinex to help thin congestion. If you have a humidifier, you can use this daily as needed.    You are to wear a mask for 5 days from onset of your symptoms.  After day 5, if you have had no fever and you are feeling better with NO symptoms, you can end masking. Keep in mind you can be contagious 10 days from the onset of symptoms  After day 5 if you have a fever or are having significant symptoms, please wear your mask for full 10 days.   If you note any worsening of symptoms, any significant shortness of breath or any chest pain, please seek ER evaluation ASAP.  Please do not delay care!    If you note any worsening of symptoms, any significant shortness of breath or any chest pain, please seek ER evaluation ASAP.  Please do not delay care!    If you have been instructed to have an in-person evaluation today at a local Urgent Care facility, please use the link below. It will take you to a list of all of our available [facility], including address, phone number and hours of operation. Please do not delay care.  [facility]  If you or a family member do not have a primary care provider, use the link below to schedule a visit and establish care. When you choose a [facility] primary care physician or advanced practice provider, you gain a long-term partner in health. Find a Primary Care Provider  Learn more about [facility] in-office and virtual care options: [facility] Get Care Now

## 2024-04-24 NOTE — Progress Notes (Signed)
 Virtual Visit Consent   April Raymond, you are scheduled for a virtual visit with a [facility] provider today. Just as with appointments in the office, your consent must be obtained to participate. Your consent will be active for this visit and any virtual visit you may have with one of our providers in the next 365 days. If you have a MyChart account, a copy of this consent can be sent to you electronically.  As this is a virtual visit, video technology does not allow for your provider to perform a traditional examination. This may limit your provider's ability to fully assess your condition. If your provider identifies any concerns that need to be evaluated in person or the need to arrange testing (such as labs, EKG, etc.), we will make arrangements to do so. Although advances in technology are sophisticated, we cannot ensure that it will always work on either your end or our end. If the connection with a video visit is poor, the visit may have to be switched to a telephone visit. With either a video or telephone visit, we are not always able to ensure that we have a secure connection.  By engaging in this virtual visit, you consent to the provision of healthcare and authorize for your insurance to be billed (if applicable) for the services provided during this visit. Depending on your insurance coverage, you may receive a charge related to this service.  I need to obtain your verbal consent now. Are you willing to proceed with your visit today? April Raymond has provided verbal consent on 04/24/2024 for a virtual visit (video or telephone). Haze LELON Servant, NP  Date: 04/24/2024 5:40 PM   Virtual Visit via Video Note   I, Haze LELON Servant, connected with  April Raymond  ([id number], 1961-07-05) on 04/24/24 at  6:30 PM EDT by a video-enabled telemedicine application and verified that I am speaking with the correct person using two identifiers.  Location: Patient: Virtual Visit Location Patient:  Home Provider: Virtual Visit Location Provider: Home Office   I discussed the limitations of evaluation and management by telemedicine and the availability of in person appointments. The patient expressed understanding and agreed to proceed.    History of Present Illness: April Raymond is a 63 y.o. who identifies as a female who was assigned female at birth, and is being seen today for COVID-positive.   Mrs. Shoults took a home antigen test today and is currently COVID-positive.  She has a 1 day symptoms onset of headache, fatigue, myalgia, congestion and sore throat.  She has a significant cardiac history and is requesting antiviral today.  She denies fever, chest pain or worsening shortness of breath.     Problems:  Patient Active Problem List   Diagnosis Date Noted   Leg cramping 04/20/2024   Heart failure with improved ejection fraction (HFimpEF) (HCC) 02/05/2024   Heterozygous alpha 1-antitrypsin deficiency (HCC) 09/09/2022   GERD without esophagitis 09/09/2022   DDD (degenerative disc disease), cervical 11/19/2021   Chronic upper back pain (1ry area of Pain) (Right) 10/22/2021   H/O lipoma 05/09/2021   Stage 2 moderate COPD by GOLD classification (HCC) 01/12/2021   PAD (peripheral artery disease) (HCC) 09/22/2020   Stress-induced cardiomyopathy 05/31/2020   Former smoker 09/30/2019   History of MI (myocardial infarction) 09/30/2019   History of parathyroidectomy 09/30/2019   Coronary artery disease involving native coronary artery of native heart without angina pectoris 09/30/2019   Essential hypertension 09/30/2019   Hyperlipidemia  LDL goal <70 09/30/2019   Hashimoto's disease 09/30/2019   Vaginal atrophy 09/30/2019   Hidradenitis suppurativa 09/30/2019    Allergies:  Allergies  Allergen Reactions   Azithromycin  Rash   Humira [Adalimumab] Rash   Tape Rash   Medications:  Current Outpatient Medications:    molnupiravir  EUA (LAGEVRIO ) 200 mg CAPS capsule, Take 4  capsules (800 mg total) by mouth 2 (two) times daily for 5 days., Disp: 40 capsule, Rfl: 0   acetaminophen  (TYLENOL ) 500 MG tablet, Take 1,000 mg by mouth daily as needed., Disp: , Rfl:    Albuterol  Sulfate (PROAIR  RESPICLICK) 108 (90 Base) MCG/ACT AEPB, INHALE 2 PUFFS INTO THE LUNGS EVERY 6 (SIX) HOURS AS NEEDED (SOB/WHEEZING)., Disp: 1 each, Rfl: 0   atorvastatin  (LIPITOR ) 80 MG tablet, Take 1 tablet (80 mg total) by mouth daily., Disp: 90 tablet, Rfl: 4   betamethasone  dipropionate 0.05 % cream, Apply topically 2 (two) times daily., Disp: 30 g, Rfl: 0   cetirizine (ZYRTEC) 10 MG tablet, Take 10 mg by mouth daily., Disp: , Rfl:    diphenhydrAMINE  (BENADRYL ) 25 MG tablet, Take 25 mg by mouth at bedtime., Disp: , Rfl:    fluticasone (FLONASE) 50 MCG/ACT nasal spray, Place 2 sprays into both nostrils daily., Disp: , Rfl:    furosemide  (LASIX ) 20 MG tablet, Take 1 tablet (20 mg total) by mouth daily as needed., Disp: 90 tablet, Rfl: 4   levothyroxine  (SYNTHROID ) 75 MCG tablet, TAKE 1 TABLET BY MOUTH EVERY DAY BEFORE BREAKFAST, Disp: 90 tablet, Rfl: 4   losartan  (COZAAR ) 25 MG tablet, Take 0.5 tablets (12.5 mg total) by mouth daily., Disp: 45 tablet, Rfl: 4   metoprolol  succinate (TOPROL -XL) 25 MG 24 hr tablet, Take 0.5 tablets (12.5 mg total) by mouth daily., Disp: 45 tablet, Rfl: 4   Multiple Vitamins-Minerals (CENTRUM SILVER 50+WOMEN PO), Take 1 tablet by mouth daily at 6 (six) AM., Disp: , Rfl:    nitroGLYCERIN  (NITROSTAT ) 0.4 MG SL tablet, Place 1 tablet (0.4 mg total) under the tongue every 5 (five) minutes as needed for chest pain., Disp: 25 tablet, Rfl: 3   pantoprazole  (PROTONIX ) 40 MG tablet, Take 1 tablet (40 mg total) by mouth daily., Disp: 90 tablet, Rfl: 4   PREMARIN  vaginal cream, PLACE 0.5 GRAMS TWICE WEEKLY INTRAVAGINALLY., Disp: 30 g, Rfl: 0   ticagrelor  (BRILINTA ) 60 MG TABS tablet, TAKE 1 TABLET (60 MG TOTAL) BY MOUTH 2 (TWO) TIMES DAILY., Disp: 180 tablet, Rfl: 4   tiZANidine   (ZANAFLEX ) 4 MG tablet, Take 1 tablet (4 mg total) by mouth every 8 (eight) hours as needed for muscle spasms., Disp: 270 tablet, Rfl: 1   umeclidinium-vilanterol (ANORO ELLIPTA ) 62.5-25 MCG/ACT AEPB, Inhale 1 puff into the lungs daily., Disp: 180 each, Rfl: 3 No current facility-administered medications for this visit.  Facility-Administered Medications Ordered in Other Visits:    albuterol  (PROVENTIL ) (2.5 MG/3ML) 0.083% nebulizer solution 2.5 mg, 2.5 mg, Nebulization, Once, Tamea Dedra CROME, MD  Observations/Objective: Patient is well-developed, well-nourished in no acute distress.  Resting comfortably at home.  Head is normocephalic, atraumatic.  No labored breathing.  Speech is clear and coherent with logical content.  Patient is alert and oriented at baseline.    Assessment and Plan: 1. Positive self-administered antigen test for COVID-19 (Primary) - molnupiravir  EUA (LAGEVRIO ) 200 mg CAPS capsule; Take 4 capsules (800 mg total) by mouth 2 (two) times daily for 5 days.  Dispense: 40 capsule; Refill: 0  Not a candidate for Paxlovid due to  taking Brilinta    I recommend taking Coricidin HBP for cough and congestion  Please keep well-hydrated and get plenty of rest. Start a saline nasal rinse to flush out your nasal passages. You can use plain Mucinex to help thin congestion. If you have a humidifier, you can use this daily as needed.    You are to wear a mask for 5 days from onset of your symptoms.  After day 5, if you have had no fever and you are feeling better with NO symptoms, you can end masking. Keep in mind you can be contagious 10 days from the onset of symptoms  After day 5 if you have a fever or are having significant symptoms, please wear your mask for full 10 days.   If you note any worsening of symptoms, any significant shortness of breath or any chest pain, please seek ER evaluation ASAP.  Please do not delay care!    If you note any worsening of symptoms, any  significant shortness of breath or any chest pain, please seek ER evaluation ASAP.  Please do not delay care!   Follow Up Instructions: I discussed the assessment and treatment plan with the patient. The patient was provided an opportunity to ask questions and all were answered. The patient agreed with the plan and demonstrated an understanding of the instructions.  A copy of instructions were sent to the patient via MyChart unless otherwise noted below.    The patient was advised to call back or seek an in-person evaluation if the symptoms worsen or if the condition fails to improve as anticipated.    Siris Hoos W Aiman Noe, NP

## 2024-05-03 ENCOUNTER — Other Ambulatory Visit
Admission: RE | Admit: 2024-05-03 | Discharge: 2024-05-03 | Disposition: A | Discharge status: Home / Self Care | Source: Ambulatory Visit | Attending: Pulmonary Disease | Admitting: Pulmonary Disease

## 2024-05-03 ENCOUNTER — Encounter: Payer: Self-pay | Admitting: Pulmonary Disease

## 2024-05-03 ENCOUNTER — Ambulatory Visit: Admitting: Pulmonary Disease

## 2024-05-03 VITALS — BP 126/86 | HR 73 | Temp 98.3°F | Ht 66.0 in | Wt 146.2 lb

## 2024-05-03 DIAGNOSIS — J449 Chronic obstructive pulmonary disease, unspecified: Secondary | ICD-10-CM

## 2024-05-03 DIAGNOSIS — E8801 Alpha-1-antitrypsin deficiency: Secondary | ICD-10-CM | POA: Insufficient documentation

## 2024-05-03 DIAGNOSIS — Z8616 Personal history of COVID-19: Secondary | ICD-10-CM

## 2024-05-03 DIAGNOSIS — Z87891 Personal history of nicotine dependence: Secondary | ICD-10-CM | POA: Diagnosis not present

## 2024-05-03 NOTE — Patient Instructions (Signed)
 VISIT SUMMARY:  Today, we discussed your recent mild COVID-19 infection and your ongoing management of COPD associated with heterozygous alpha-1 antitrypsin deficiency. You are recovering well from COVID-19 and managing your COPD effectively with your current inhalers.  YOUR PLAN:  -CHRONIC OBSTRUCTIVE PULMONARY DISEASE (COPD) ASSOCIATED WITH HETEROZYGOUS ALPHA-1 ANTITRYPSIN DEFICIENCY: COPD is a chronic lung condition that makes it hard to breathe, and in your case, it is associated with a genetic condition called alpha-1 antitrypsin deficiency. You are using your inhalers effectively. We will order a test to check your alpha-1 antitrypsin levels to monitor for any decline and continue your current inhaler regimen.  -COVID-19 INFECTION: You had a mild case of COVID-19 over a week and a half ago, and you are recovering well. Early treatment likely helped keep your symptoms mild. The current strain of the virus seems to cause less severe illness overall.  INSTRUCTIONS:  Please schedule a follow-up appointment in six months.

## 2024-05-03 NOTE — Progress Notes (Signed)
 Subjective:    Patient ID: April Raymond, female    DOB: 05-22-1961, 63 y.o.   MRN: [id number]  Patient Care Team: Valerio Melanie DASEN, NP as PCP - General (Nurse Practitioner) End, Lonni, MD as PCP - Cardiology (Cardiology)  Chief Complaint  Patient presents with   Follow-up    BACKGROUND/INTERVAL:Aerica is a 63 year old former smoker with a 40-pack-year history of smoking, alpha-1 MZ phenotype with mild deficiency and dyspnea who presents for follow-up.  This is a scheduled visit.  She was last seen on 01 October 2023.  At that time she had an alpha level of 87 mg/dL.   HPI Discussed the use of AI scribe software for clinical note transcription with the patient, who gave verbal consent to proceed.  History of Present Illness   April Raymond is a 63 year old female with COPD associated with heterozygous alpha-1 antitrypsin deficiency (MZ phenotype) who presents for follow-up.  She contracted COVID-19 more than a week and a half ago after returning from a three-week trip. Her husband initially showed symptoms of a cold, but she suspected it was not a cold when she began experiencing symptoms herself. She started medication immediately (Paxlovid), which helped prevent severe symptoms. She describes her COVID-19 experience as relatively mild, and her husband's symptoms did not worsen either.  She is currently managing her COPD with inhalers and reports doing well with them.  Her inhaler regimen is Anoro elliptica 1 inhalation daily and albuterol  sulfate as needed.  She also has available a nebulizer machine with albuterol  for rescue if needed.  She has not required use of the rescue inhaler or nebulizer since her last visit and did not require it during her COVID-19 illness.   Cancer screening CT will be on 20 August.   DATA: 10/05/2020 2D echo: Small to moderate size pericardial effusion without hemodynamic compromise, LVEF 60 to 65%. Indeterminate diastolics.  Normal RV  function.  No valvular abnormality 10/17/2020 PFTs: FEV1 1.71 L or 62% predicted, FVC 2.73 L or 76% predicted, FEV1/FVC 62%, no bronchodilator response. Diffusion capacity mildly reduced.  Consistent with moderate COPD. 12/04/2020 alpha-1-antitrypsin: Phenotype MZ, level 80 mg/dl. 04/29/2022 alpha 1 antitrypsin level: 87 mg/dL 91/70/7976 PFTs: FEV1 7.79 L or 81% predicted, FVC 3.17 L or 90% predicted, FEV1/FVC 69%, no bronchodilator response.  There is mild hyperinflation and moderate air trapping on lung volumes.  Diffusion capacity moderately reduced.  Compared to 17 October 2020 no significant change.  Study consistent with mild obstructive airways disease with moderate diffusion defect. 11/05/2022 alpha-1 antitrypsin level: 89 mg/dL 96/70/7975 echocardiogram: LVEF 60 to 65%, no wall motion abnormalities, normal right ventricular systolic function.  Normal pulmonary artery systolic pressure.  Small to moderate size pericardial effusion, circumferential, no evidence of cardiac tamponade.  Mild mitral regurgitation. 05/06/2023 alpha 1 antitrypsin level: 87 mg/dL 88/80/7975 PFTs: Limited to spirometry pre and post BD.  FEV1 2.17 L 81% predicted, VC 3.20 L or 92% predicted, FEV1/FVC 68%.  No bronchodilator response.   Review of Systems A 10 point review of systems was performed and it is as noted above otherwise negative.   Patient Active Problem List   Diagnosis Date Noted   Leg cramping 04/20/2024   Heart failure with improved ejection fraction (HFimpEF) (HCC) 02/05/2024   Heterozygous alpha 1-antitrypsin deficiency (HCC) 09/09/2022   GERD without esophagitis 09/09/2022   DDD (degenerative disc disease), cervical 11/19/2021   Chronic upper back pain (1ry area of Pain) (Right) 10/22/2021   H/O lipoma 05/09/2021  Stage 2 moderate COPD by GOLD classification (HCC) 01/12/2021   PAD (peripheral artery disease) (HCC) 09/22/2020   Stress-induced cardiomyopathy 05/31/2020   Former smoker  09/30/2019   History of MI (myocardial infarction) 09/30/2019   History of parathyroidectomy 09/30/2019   Coronary artery disease involving native coronary artery of native heart without angina pectoris 09/30/2019   Essential hypertension 09/30/2019   Hyperlipidemia LDL goal <70 09/30/2019   Hashimoto's disease 09/30/2019   Vaginal atrophy 09/30/2019   Hidradenitis suppurativa 09/30/2019    Social History   Tobacco Use   Smoking status: Former    Current packs/day: 0.00    Average packs/day: 1 pack/day for 40.0 years (40.0 ttl pk-yrs)    Types: Cigarettes    Start date: 09/18/1975    Quit date: 09/18/2015    Years since quitting: 8.6   Smokeless tobacco: Never  Substance Use Topics   Alcohol use: Yes    Alcohol/week: 3.0 standard drinks of alcohol    Types: 3 Glasses of wine per week    Comment: 2-3 glasses wine, 2-3x/wk    Allergies  Allergen Reactions   Azithromycin  Rash   Humira [Adalimumab] Rash   Tape Rash    Current Meds  Medication Sig   acetaminophen  (TYLENOL ) 500 MG tablet Take 1,000 mg by mouth daily as needed.   Albuterol  Sulfate (PROAIR  RESPICLICK) 108 (90 Base) MCG/ACT AEPB INHALE 2 PUFFS INTO THE LUNGS EVERY 6 (SIX) HOURS AS NEEDED (SOB/WHEEZING).   atorvastatin  (LIPITOR ) 80 MG tablet Take 1 tablet (80 mg total) by mouth daily.   betamethasone  dipropionate 0.05 % cream Apply topically 2 (two) times daily.   cetirizine (ZYRTEC) 10 MG tablet Take 10 mg by mouth daily.   diphenhydrAMINE  (BENADRYL ) 25 MG tablet Take 25 mg by mouth at bedtime.   fluticasone (FLONASE) 50 MCG/ACT nasal spray Place 2 sprays into both nostrils daily.   furosemide  (LASIX ) 20 MG tablet Take 1 tablet (20 mg total) by mouth daily as needed.   levothyroxine  (SYNTHROID ) 75 MCG tablet TAKE 1 TABLET BY MOUTH EVERY DAY BEFORE BREAKFAST   losartan  (COZAAR ) 25 MG tablet Take 0.5 tablets (12.5 mg total) by mouth daily.   metoprolol  succinate (TOPROL -XL) 25 MG 24 hr tablet Take 0.5 tablets (12.5  mg total) by mouth daily.   Multiple Vitamins-Minerals (CENTRUM SILVER 50+WOMEN PO) Take 1 tablet by mouth daily at 6 (six) AM.   nitroGLYCERIN  (NITROSTAT ) 0.4 MG SL tablet Place 1 tablet (0.4 mg total) under the tongue every 5 (five) minutes as needed for chest pain.   pantoprazole  (PROTONIX ) 40 MG tablet Take 1 tablet (40 mg total) by mouth daily.   PREMARIN  vaginal cream PLACE 0.5 GRAMS TWICE WEEKLY INTRAVAGINALLY.   ticagrelor  (BRILINTA ) 60 MG TABS tablet TAKE 1 TABLET (60 MG TOTAL) BY MOUTH 2 (TWO) TIMES DAILY.   tiZANidine  (ZANAFLEX ) 4 MG tablet Take 1 tablet (4 mg total) by mouth every 8 (eight) hours as needed for muscle spasms.   umeclidinium-vilanterol (ANORO ELLIPTA ) 62.5-25 MCG/ACT AEPB Inhale 1 puff into the lungs daily.    Immunization History  Administered Date(s) Administered   Influenza, Mdck, Trivalent,PF 6+ MOS(egg free) 07/03/2023   Influenza,inj,Quad PF,6+ Mos 05/19/2020   Influenza-Unspecified 07/19/2019, 06/22/2021, 06/12/2022   Moderna SARS-COV2 Booster Vaccination 01/12/2021   Moderna Sars-Covid-2 Vaccination 12/16/2019, 01/13/2020, 08/08/2020, 06/22/2021   PNEUMOCOCCAL CONJUGATE-20 10/10/2023   Pfizer(Comirnaty)Fall Seasonal Vaccine 12 years and older 07/03/2023   Tdap 03/21/2020   Zoster Recombinant(Shingrix ) 05/17/2022, 09/30/2022        Objective:  BP 126/86 (BP Location: Left Arm, Patient Position: Sitting, Cuff Size: Normal)   Pulse 73   Temp 98.3 F (36.8 C) (Oral)   Ht 5' 6 (1.676 m)   Wt 146 lb 3.2 oz (66.3 kg)   LMP 11/16/2007 (Approximate)   SpO2 98%   BMI 23.60 kg/m   SpO2: 98 %  GENERAL: Well-developed, well-nourished woman in no acute distress.  Fully ambulatory.  No conversational dyspnea. HEAD: Normocephalic, atraumatic.  EYES: Pupils equal, round, reactive to light.  No scleral icterus.  MOUTH: Dentition intact, oral mucosa moist.  No thrush. NECK: Supple. No thyromegaly. Trachea midline. No JVD.  No adenopathy. PULMONARY:  Good air entry bilaterally.  No adventitious sounds. CARDIOVASCULAR: S1 and S2. Regular rate and rhythm.  No rubs, murmurs or gallops heard. ABDOMEN: Benign. MUSCULOSKELETAL: No joint deformity, no clubbing, no edema.  NEUROLOGIC: No focal deficit, no gait disturbance, speech is fluent. SKIN: Intact,warm,dry.  No rashes. PSYCH: Mood and behavior normal.       Assessment & Plan:     ICD-10-CM   1. Heterozygous alpha 1-antitrypsin deficiency (HCC)  E88.01 Alpha-1-antitrypsin    2. Stage 2 moderate COPD by GOLD classification (HCC)  J44.9 Alpha-1-antitrypsin    3. Personal history of tobacco use, presenting hazards to health  Z87.891       Orders Placed This Encounter  Procedures   Alpha-1-antitrypsin    Standing Status:   Future    Number of Occurrences:   1    Expiration Date:   05/03/2025   Discussion:    Chronic Obstructive Pulmonary Disease (COPD) associated with heterozygous alpha-1 antitrypsin deficiency She reports effective use of her inhalers. - Order alpha-1 antitrypsin level test to monitor for any decline - Continue current inhaler regimen  COVID-19 infection Experienced a mild case of COVID-19 over a week and a half ago. Early treatment likely contributed to the mild course. Symptoms did not worsen significantly, and she is recovering well. The current strain appears less virulent, contributing to milder cases overall.  Follow-up - Schedule follow-up appointment in six months   Will notify patient of any issues with lung cancer screening CT.  Will also follow-up on alpha-1.  Advised if symptoms do not improve or worsen, to please contact office for sooner follow up or seek emergency care.    I spent 30 minutes of dedicated to the care of this patient on the date of this encounter to include pre-visit review of records, face-to-face time with the patient discussing conditions above, post visit ordering of testing, clinical documentation with the electronic  health record, making appropriate referrals as documented, and communicating necessary findings to members of the patients care team.     C. Leita Sanders, MD Advanced Bronchoscopy PCCM [facility]    *This note was generated using voice recognition software/Dragon and/or AI transcription program.  Despite best efforts to proofread, errors can occur which can change the meaning. Any transcriptional errors that result from this process are unintentional and may not be fully corrected at the time of dictation.

## 2024-05-04 LAB — ALPHA-1-ANTITRYPSIN: A-1 Antitrypsin, Ser: 95 mg/dL — ABNORMAL LOW (ref 101–187)

## 2024-05-05 ENCOUNTER — Ambulatory Visit
Admission: RE | Admit: 2024-05-05 | Discharge: 2024-05-05 | Disposition: A | Discharge status: Home / Self Care | Source: Ambulatory Visit | Attending: Acute Care | Admitting: Acute Care

## 2024-05-05 ENCOUNTER — Ambulatory Visit: Payer: Self-pay | Admitting: Pulmonary Disease

## 2024-05-05 DIAGNOSIS — Z87891 Personal history of nicotine dependence: Secondary | ICD-10-CM | POA: Diagnosis present

## 2024-05-05 DIAGNOSIS — Z122 Encounter for screening for malignant neoplasm of respiratory organs: Secondary | ICD-10-CM | POA: Insufficient documentation

## 2024-05-10 ENCOUNTER — Encounter: Payer: Self-pay | Admitting: Dermatology

## 2024-05-10 ENCOUNTER — Ambulatory Visit (INDEPENDENT_AMBULATORY_CARE_PROVIDER_SITE_OTHER): Payer: No Typology Code available for payment source | Admitting: Dermatology

## 2024-05-10 DIAGNOSIS — L814 Other melanin hyperpigmentation: Secondary | ICD-10-CM | POA: Diagnosis not present

## 2024-05-10 DIAGNOSIS — W908XXA Exposure to other nonionizing radiation, initial encounter: Secondary | ICD-10-CM | POA: Diagnosis not present

## 2024-05-10 DIAGNOSIS — L578 Other skin changes due to chronic exposure to nonionizing radiation: Secondary | ICD-10-CM | POA: Diagnosis not present

## 2024-05-10 DIAGNOSIS — Z1283 Encounter for screening for malignant neoplasm of skin: Secondary | ICD-10-CM | POA: Diagnosis not present

## 2024-05-10 DIAGNOSIS — Z872 Personal history of diseases of the skin and subcutaneous tissue: Secondary | ICD-10-CM

## 2024-05-10 DIAGNOSIS — L089 Local infection of the skin and subcutaneous tissue, unspecified: Secondary | ICD-10-CM

## 2024-05-10 DIAGNOSIS — L732 Hidradenitis suppurativa: Secondary | ICD-10-CM

## 2024-05-10 DIAGNOSIS — L821 Other seborrheic keratosis: Secondary | ICD-10-CM

## 2024-05-10 DIAGNOSIS — D2271 Melanocytic nevi of right lower limb, including hip: Secondary | ICD-10-CM

## 2024-05-10 DIAGNOSIS — D1801 Hemangioma of skin and subcutaneous tissue: Secondary | ICD-10-CM

## 2024-05-10 DIAGNOSIS — D229 Melanocytic nevi, unspecified: Secondary | ICD-10-CM

## 2024-05-10 NOTE — Patient Instructions (Signed)

## 2024-05-10 NOTE — Progress Notes (Signed)
   Follow-Up Visit   Subjective  April Raymond is a 63 y.o. female who presents for the following: Skin Cancer Screening and Full Body Skin Exam  The patient presents for Total-Body Skin Exam (TBSE) for skin cancer screening and mole check. The patient has spots, moles and lesions to be evaluated, some may be new or changing and the patient may have concern these could be cancer.  No hx skin cancer. Patient does have hx of HS. Patient does have what she thinks are itchy bug bites at hands since May, some prior at lower legs. PCP gave her betamethasone  to use as needed.  The following portions of the chart were reviewed this encounter and updated as appropriate: medications, allergies, medical history  Review of Systems:  No other skin or systemic complaints except as noted in HPI or Assessment and Plan.  Objective  Well appearing patient in no apparent distress; mood and affect are within normal limits.  A full examination was performed including scalp, head, eyes, ears, nose, lips, neck, chest, axillae, abdomen, back, buttocks, bilateral upper extremities, bilateral lower extremities, hands, feet, fingers, toes, fingernails, and toenails. All findings within normal limits unless otherwise noted below.   Relevant physical exam findings are noted in the Assessment and Plan.    Assessment & Plan   SKIN CANCER SCREENING PERFORMED TODAY.  ACTINIC DAMAGE - Chronic condition, secondary to cumulative UV/sun exposure - diffuse scaly erythematous macules with underlying dyspigmentation - Recommend daily broad spectrum sunscreen SPF 30+ to sun-exposed areas, reapply every 2 hours as needed.  - Staying in the shade or wearing long sleeves, sun glasses (UVA+UVB protection) and wide brim hats (4-inch brim around the entire circumference of the hat) are also recommended for sun protection.  - Call for new or changing lesions.  LENTIGINES, SEBORRHEIC KERATOSES, HEMANGIOMAS - Benign normal skin  lesions - Benign-appearing - Call for any changes  MELANOCYTIC NEVI - Tan-brown and/or pink-flesh-colored symmetric macules and papules - Benign appearing on exam today - Observation - Call clinic for new or changing moles - Recommend daily use of broad spectrum spf 30+ sunscreen to sun-exposed areas.  - R 3rd toe 0.5cm medium brown macule - stable   PROBABLE INSECT BITE RXN Exam: pink inflamed papules scattered on lower legs  Treatment Plan: Continue betamethasone  prn.  Patient advised to let us  know if she gets a new spot to let us  know and we can offer biopsy. Do not apply betamethasone  to spot to be biopsied.   HISTORY OF HIDRADENITIS SUPPURATIVA Exam: groin clear   Treatment Plan: Clear today, hx of surgery in past on both sides of groin.  Observe for recurrence   MULTIPLE BENIGN NEVI   LENTIGINES   ACTINIC ELASTOSIS   SEBORRHEIC KERATOSES   CHERRY ANGIOMA   INFECTED BITE OF LOWER LEG, INITIAL ENCOUNTER   HIDRADENITIS SUPPURATIVA   Return in about 1 year (around 05/10/2025) for TBSE, with Dr. Claudene.  LILLETTE Lonell Drones, RMA, am acting as scribe for Boneta Claudene, MD .   Documentation: I have reviewed the above documentation for accuracy and completeness, and I agree with the above.  Boneta Claudene, MD

## 2024-05-21 ENCOUNTER — Ambulatory Visit
Admission: RE | Admit: 2024-05-21 | Discharge: 2024-05-21 | Disposition: A | Discharge status: Home / Self Care | Source: Ambulatory Visit | Attending: Nurse Practitioner | Admitting: Nurse Practitioner

## 2024-05-21 DIAGNOSIS — Z1231 Encounter for screening mammogram for malignant neoplasm of breast: Secondary | ICD-10-CM | POA: Insufficient documentation

## 2024-05-24 ENCOUNTER — Other Ambulatory Visit: Payer: Self-pay | Admitting: Acute Care

## 2024-05-24 DIAGNOSIS — Z122 Encounter for screening for malignant neoplasm of respiratory organs: Secondary | ICD-10-CM

## 2024-05-24 DIAGNOSIS — Z87891 Personal history of nicotine dependence: Secondary | ICD-10-CM

## 2024-05-26 ENCOUNTER — Ambulatory Visit: Payer: Self-pay | Admitting: Nurse Practitioner

## 2024-05-26 NOTE — Progress Notes (Signed)
 Contacted via MyChart   Normal mammogram, may repeat in one year:)

## 2024-07-04 ENCOUNTER — Other Ambulatory Visit: Payer: Self-pay | Admitting: Nurse Practitioner

## 2024-07-06 NOTE — Telephone Encounter (Signed)
 Requested Prescriptions  Pending Prescriptions Disp Refills   conjugated estrogens  (PREMARIN ) vaginal cream [Pharmacy Med Name: PREMARIN  VAGINAL CREAM-APPL] 30 g 1    Sig: PLACE 0.5 GRAMS TWICE WEEKLY INTRAVAGINALLY.     OB/GYN:  Estrogens  Passed - 07/06/2024  1:59 PM      Passed - Mammogram is up-to-date per Health Maintenance      Passed - Last BP in normal range    BP Readings from Last 1 Encounters:  05/03/24 126/86         Passed - Valid encounter within last 12 months    Recent Outpatient Visits           2 months ago Heart failure with improved ejection fraction (HFimpEF) (HCC)   [facility] Valerio Melanie DASEN, NP       Future Appointments             In 10 months Claudene Lehmann, MD [facility]

## 2024-08-24 ENCOUNTER — Ambulatory Visit
Admission: EM | Admit: 2024-08-24 | Discharge: 2024-08-24 | Disposition: A | Discharge status: Home / Self Care | Attending: Emergency Medicine | Admitting: Emergency Medicine

## 2024-08-24 DIAGNOSIS — J020 Streptococcal pharyngitis: Secondary | ICD-10-CM

## 2024-08-24 LAB — POCT RAPID STREP A (OFFICE): Rapid Strep A Screen: POSITIVE — AB

## 2024-08-24 MED ORDER — AMOXICILLIN 500 MG PO CAPS: 500.0000 mg | ORAL_CAPSULE | Freq: Two times a day (BID) | ORAL | 0 refills | Status: AC

## 2024-08-24 NOTE — ED Provider Notes (Signed)
 April Raymond    CSN: [id number] Arrival date & time: 08/24/24  1322      History   Chief Complaint Chief Complaint  Patient presents with   Sore Throat    HPI April Raymond is a 63 y.o. female.  Patient presents with sore throat since early this morning.  No fever, cough, shortness of breath, vomiting, diarrhea.  She took Coricidin this morning.  She was exposed to strep throat by family members over the weekend.  The history is provided by the patient and medical records.    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 2017 s/p prior MI & PCI RCA ([place] ); b. 05/2020 Abnl ETT: 1.5-85mm horizontal ST dep in inflat leads @ peak stress w/ freq PVCs/couplets/bigeminy. HTN response; b. 05/2020 NSTEMI/Cath: LM nl, LAD 48m, LCX 20, RCA 35ost, patent prev placed prox-dist stent. EF 25-35% w/ apical ballooning.   Cataract 2019   COPD (chronic obstructive pulmonary disease) (HCC)    Dental bridge present    Permanent lower   Hashimoto's disease    Hidradenitis suppurativa    Hydradenitis    Hyperparathyroidism    Hypertension    Myocardial infarction (HCC) 2017   Pericardial effusion    a. 05/2020 Echo: sm to mod circumferential peric eff w/o tamponade; b. 07/2020 Mod peric effusion; c. 09/2020 Echo: EF 60-65%, no rwma, small tomod effusion w/o compromise.   Raynaud's syndrome    Stroke [facility])    Takotsubo cardiomyopathy    a. 05/2020 Echo: EF 35-40%, glob HK, though basal wall motion best preserved consistent w/ stress induced CM. Gr1 DD. Nl RV size/fxn. Sm to mod circumferential pericardial effusion w/o tamponade.   Wears contact lenses    Wears hearing aid in left ear     Patient Active Problem List   Diagnosis Date Noted   Leg cramping 04/20/2024   Heart failure with improved ejection fraction (HFimpEF) (HCC) 02/05/2024   Heterozygous alpha  1-antitrypsin deficiency (HCC) 09/09/2022   GERD without esophagitis 09/09/2022   DDD (degenerative disc disease), cervical 11/19/2021   Chronic upper back pain (1ry area of Pain) (Right) 10/22/2021   H/O lipoma 05/09/2021   Stage 2 moderate COPD by GOLD classification (HCC) 01/12/2021   PAD (peripheral artery disease) 09/22/2020   Stress-induced cardiomyopathy 05/31/2020   Former smoker 09/30/2019   History of MI (myocardial infarction) 09/30/2019   History of parathyroidectomy 09/30/2019   Coronary artery disease involving native coronary artery of native heart without angina pectoris 09/30/2019   Essential hypertension 09/30/2019   Hyperlipidemia LDL goal <70 09/30/2019   Hashimoto's disease 09/30/2019   Vaginal atrophy 09/30/2019   Hidradenitis suppurativa 09/30/2019    Past Surgical History:  Procedure Laterality Date   BREAST BIOPSY Right 05/04/2021   stereo x clip BENIGN BREAST TISSUE WITH MILD STROMAL FIBROSIS AND HEMORRHAGE   BREAST CYST ASPIRATION Right 05/01/2021   BREAST EXCISIONAL BIOPSY Left    age 30's   CARDIAC CATHETERIZATION     CATARACT EXTRACTION W/PHACO Left 04/22/2022   Procedure: CATARACT EXTRACTION PHACO AND INTRAOCULAR LENS PLACEMENT (IOC) LEFT;  Surgeon: Myrna Adine Anes, MD;  Location: [facility];  Service: Ophthalmology;  Laterality: Left;  1.42 00:19.9   CATARACT EXTRACTION W/PHACO Right 05/06/2022   Procedure: CATARACT EXTRACTION PHACO AND INTRAOCULAR LENS PLACEMENT (IOC) RIGHT 1.83 00:25.4;  Surgeon: Myrna Adine Anes, MD;  Location: [facility];  Service: Ophthalmology;  Laterality: Right;   CHEST WALL RECONSTRUCTION Right 05/09/2021   Procedure: CHEST WALL MASS RESECTION;  Surgeon: Shyrl Linnie KIDD, MD;  Location: [facility] OR;  Service: Thoracic;  Laterality: Right;  posterior   CYST EXCISION  x2   EYE SURGERY  05/15/22   Cataract surgery   LEFT HEART CATH AND CORONARY ANGIOGRAPHY N/A 05/31/2020   Procedure: LEFT HEART CATH AND  CORONARY ANGIOGRAPHY;  Surgeon: Mady Bruckner, MD;  Location: [facility];  Service: Cardiovascular;  Laterality: N/A;   LIPOMA EXCISION     PARATHYROIDECTOMY     TONSILLECTOMY      OB History   No obstetric history on file.      Home Medications    Prior to Admission medications   Medication Sig Start Date End Date Taking? Authorizing Provider  amoxicillin  (AMOXIL ) 500 MG capsule Take 1 capsule (500 mg total) by mouth 2 (two) times daily for 10 days. 08/24/24 09/03/24 Yes Corlis Burnard DEL, NP  VEVYE 0.1 % SOLN  08/23/24  Yes [provider]  acetaminophen  (TYLENOL ) 500 MG tablet Take 1,000 mg by mouth daily as needed.    [provider]  Albuterol  Sulfate (PROAIR  RESPICLICK) 108 (90 Base) MCG/ACT AEPB INHALE 2 PUFFS INTO THE LUNGS EVERY 6 (SIX) HOURS AS NEEDED (SOB/WHEEZING). 01/08/24   Tamea Dedra CROME, MD  atorvastatin  (LIPITOR ) 80 MG tablet Take 1 tablet (80 mg total) by mouth daily. 10/10/23   Cannady, Jolene T, NP  betamethasone  dipropionate 0.05 % cream Apply topically 2 (two) times daily. 02/20/24   Blair, Diane W, FNP  cetirizine (ZYRTEC) 10 MG tablet Take 10 mg by mouth daily.    [provider]  conjugated estrogens  (PREMARIN ) vaginal cream PLACE 0.5 GRAMS TWICE WEEKLY INTRAVAGINALLY. 07/06/24   Cannady, Jolene T, NP  diphenhydrAMINE  (BENADRYL ) 25 MG tablet Take 25 mg by mouth at bedtime.    [provider]  fluticasone (FLONASE) 50 MCG/ACT nasal spray Place 2 sprays into both nostrils daily.    [provider]  furosemide  (LASIX ) 20 MG tablet Take 1 tablet (20 mg total) by mouth daily as needed. 10/10/23   Cannady, Jolene T, NP  levothyroxine  (SYNTHROID ) 75 MCG tablet TAKE 1 TABLET BY MOUTH EVERY DAY BEFORE BREAKFAST 10/10/23   Cannady, Jolene T, NP  losartan  (COZAAR ) 25 MG tablet Take 0.5 tablets (12.5 mg total) by mouth daily. 10/10/23   Cannady, Jolene T, NP  metoprolol  succinate (TOPROL -XL) 25 MG 24 hr tablet Take 0.5 tablets  (12.5 mg total) by mouth daily. 10/10/23   Cannady, Jolene T, NP  Multiple Vitamins-Minerals (CENTRUM SILVER 50+WOMEN PO) Take 1 tablet by mouth daily at 6 (six) AM.    [provider]  nitroGLYCERIN  (NITROSTAT ) 0.4 MG SL tablet Place 1 tablet (0.4 mg total) under the tongue every 5 (five) minutes as needed for chest pain. 04/20/24 07/19/24  Cannady, Jolene T, NP  pantoprazole  (PROTONIX ) 40 MG tablet Take 1 tablet (40 mg total) by mouth daily. 10/10/23   Cannady, Jolene T, NP  ticagrelor  (BRILINTA ) 60 MG TABS tablet TAKE 1 TABLET (60 MG TOTAL) BY MOUTH 2 (TWO) TIMES DAILY. 10/10/23   Cannady, Jolene T, NP  tiZANidine  (ZANAFLEX ) 4 MG tablet Take 1 tablet (4 mg total) by mouth every 8 (eight) hours as needed for muscle spasms. 10/10/23   Cannady, Jolene T, NP  umeclidinium-vilanterol (ANORO ELLIPTA ) 62.5-25 MCG/ACT AEPB Inhale 1 puff into the  lungs daily. 12/15/23   Tamea Dedra CROME, MD    Family History Family History  Problem Relation Age of Onset   Heart attack Mother 44   COPD Mother    Stroke Mother    Diverticulitis Mother    Cancer Mother        breast   Hypertension Mother    Breast cancer Mother 12   Hearing loss Mother    Heart disease Mother    Hypertension Father    Hearing loss Father    Stroke Maternal Grandfather    Alzheimer's disease Paternal Grandmother    Heart attack Paternal Grandfather    Breast cancer Cousin        mat cousin x 2   Hypertension Brother     Social History Social History   Tobacco Use   Smoking status: Former    Current packs/day: 0.00    Average packs/day: 1 pack/day for 40.0 years (40.0 ttl pk-yrs)    Types: Cigarettes    Start date: 09/18/1975    Quit date: 09/18/2015    Years since quitting: 8.9   Smokeless tobacco: Never  Vaping Use   Vaping status: Never Used  Substance Use Topics   Alcohol use: Yes    Alcohol/week: 3.0 standard drinks of alcohol    Types: 3 Glasses of wine per week    Comment: 2-3 glasses wine, 2-3x/wk    Drug use: Never     Allergies   Azithromycin , Humira [adalimumab], and Tape   Review of Systems Review of Systems  Constitutional:  Negative for chills and fever.  HENT:  Positive for sore throat. Negative for ear pain.   Respiratory:  Negative for cough and shortness of breath.   Gastrointestinal:  Negative for diarrhea and vomiting.     Physical Exam Triage Vital Signs ED Triage Vitals  Encounter Vitals Group     BP 08/24/24 1339 133/81     Girls Systolic BP Percentile --      Girls Diastolic BP Percentile --      Boys Systolic BP Percentile --      Boys Diastolic BP Percentile --      Pulse Rate 08/24/24 1339 72     Resp 08/24/24 1339 18     Temp 08/24/24 1339 97.9 F (36.6 C)     Temp src --      SpO2 08/24/24 1339 97 %     Weight --      Height --      Head Circumference --      Peak Flow --      Pain Score 08/24/24 1343 5     Pain Loc --      Pain Education --      Exclude from Growth Chart --    No data found.  Updated Vital Signs BP 133/81   Pulse 72   Temp 97.9 F (36.6 C)   Resp 18   LMP 11/16/2007 (Approximate)   SpO2 97%   Visual Acuity Right Eye Distance:   Left Eye Distance:   Bilateral Distance:    Right Eye Near:   Left Eye Near:    Bilateral Near:     Physical Exam Constitutional:      General: She is not in acute distress. HENT:     Right Ear: Tympanic membrane normal.     Left Ear: Tympanic membrane normal.     Nose: Nose normal.     Mouth/Throat:     Mouth:  Mucous membranes are moist.     Pharynx: Posterior oropharyngeal erythema present.  Cardiovascular:     Rate and Rhythm: Normal rate and regular rhythm.     Heart sounds: Normal heart sounds.  Pulmonary:     Effort: Pulmonary effort is normal. No respiratory distress.     Breath sounds: Normal breath sounds.  Neurological:     Mental Status: She is alert.      UC Treatments / Results  Labs (all labs ordered are listed, but only abnormal results are  displayed) Labs Reviewed  POCT RAPID STREP A (OFFICE) - Abnormal; Notable for the following components:      Result Value   Rapid Strep A Screen Positive (*)    All other components within normal limits    EKG   Radiology No results found.  Procedures Procedures (including critical care time)  Medications Ordered in UC Medications - No data to display  Initial Impression / Assessment and Plan / UC Course  I have reviewed the triage vital signs and the nursing notes.  Pertinent labs & imaging results that were available during my care of the patient were reviewed by me and considered in my medical decision making (see chart for details).   Strep pharyngitis.  Afebrile and vital signs are stable.  Rapid strep positive.  Treating today with amoxicillin .  Tylenol  or ibuprofen as needed.  Education provided on strep throat.  Instructed patient to follow-up with her PCP if she is not improving.  She agrees to plan of care.  Final Clinical Impressions(s) / UC Diagnoses   Final diagnoses:  Strep pharyngitis     Discharge Instructions      Take the amoxicillin  as directed for strep throat.  Follow-up with your primary care provider if you are not improving.     ED Prescriptions     Medication Sig Dispense Auth. Provider   amoxicillin  (AMOXIL ) 500 MG capsule Take 1 capsule (500 mg total) by mouth 2 (two) times daily for 10 days. 20 capsule Corlis Burnard DEL, NP      PDMP not reviewed this encounter.   Corlis Burnard DEL, NP 08/24/24 1400

## 2024-08-24 NOTE — Discharge Instructions (Addendum)
 Take the amoxicillin  as directed for strep throat.  Follow-up with your primary care provider if you are not improving.

## 2024-08-24 NOTE — ED Triage Notes (Signed)
 Patient to Urgent Care with complaints of sore throat. Hurts to swallow. Symptoms started when she woke up this morning. No fevers.  Multiple exposures to strep over the weekend.   Took dose of coricidin today.

## 2024-10-14 ENCOUNTER — Other Ambulatory Visit: Payer: Self-pay | Admitting: Nurse Practitioner

## 2024-10-25 ENCOUNTER — Encounter: Admitting: Nurse Practitioner

## 2025-02-16 ENCOUNTER — Ambulatory Visit: Admitting: Internal Medicine

## 2025-05-10 ENCOUNTER — Ambulatory Visit: Admitting: Dermatology
